# Patient Record
Sex: Male | Born: 1950 | Race: White | Hispanic: No | Marital: Married | Smoking: Former smoker
Health system: Southern US, Community
[De-identification: ages and names within clinical notes are randomized; demographics above are authoritative.]

## PROBLEM LIST (undated history)

## (undated) DIAGNOSIS — I10 Essential (primary) hypertension: Secondary | ICD-10-CM

## (undated) DIAGNOSIS — N400 Enlarged prostate without lower urinary tract symptoms: Secondary | ICD-10-CM

## (undated) DIAGNOSIS — K219 Gastro-esophageal reflux disease without esophagitis: Secondary | ICD-10-CM

## (undated) DIAGNOSIS — J449 Chronic obstructive pulmonary disease, unspecified: Secondary | ICD-10-CM

## (undated) DIAGNOSIS — I219 Acute myocardial infarction, unspecified: Secondary | ICD-10-CM

## (undated) DIAGNOSIS — I639 Cerebral infarction, unspecified: Secondary | ICD-10-CM

## (undated) DIAGNOSIS — Z7289 Other problems related to lifestyle: Secondary | ICD-10-CM

## (undated) DIAGNOSIS — I779 Disorder of arteries and arterioles, unspecified: Secondary | ICD-10-CM

## (undated) DIAGNOSIS — E7800 Pure hypercholesterolemia, unspecified: Secondary | ICD-10-CM

## (undated) DIAGNOSIS — R002 Palpitations: Secondary | ICD-10-CM

## (undated) DIAGNOSIS — J439 Emphysema, unspecified: Secondary | ICD-10-CM

## (undated) DIAGNOSIS — I4729 Other ventricular tachycardia: Secondary | ICD-10-CM

## (undated) DIAGNOSIS — I472 Ventricular tachycardia: Secondary | ICD-10-CM

## (undated) DIAGNOSIS — H349 Unspecified retinal vascular occlusion: Secondary | ICD-10-CM

## (undated) DIAGNOSIS — G459 Transient cerebral ischemic attack, unspecified: Secondary | ICD-10-CM

## (undated) DIAGNOSIS — IMO0002 Reserved for concepts with insufficient information to code with codable children: Secondary | ICD-10-CM

## (undated) DIAGNOSIS — I2510 Atherosclerotic heart disease of native coronary artery without angina pectoris: Secondary | ICD-10-CM

## (undated) DIAGNOSIS — Z720 Tobacco use: Secondary | ICD-10-CM

## (undated) DIAGNOSIS — H3410 Central retinal artery occlusion, unspecified eye: Secondary | ICD-10-CM

## (undated) DIAGNOSIS — I4892 Unspecified atrial flutter: Secondary | ICD-10-CM

## (undated) DIAGNOSIS — R9430 Abnormal result of cardiovascular function study, unspecified: Secondary | ICD-10-CM

## (undated) DIAGNOSIS — I509 Heart failure, unspecified: Secondary | ICD-10-CM

## (undated) DIAGNOSIS — F1090 Alcohol use, unspecified, uncomplicated: Secondary | ICD-10-CM

## (undated) DIAGNOSIS — Z789 Other specified health status: Secondary | ICD-10-CM

## (undated) DIAGNOSIS — C61 Malignant neoplasm of prostate: Secondary | ICD-10-CM

## (undated) DIAGNOSIS — I7300 Raynaud's syndrome without gangrene: Secondary | ICD-10-CM

## (undated) DIAGNOSIS — I739 Peripheral vascular disease, unspecified: Secondary | ICD-10-CM

## (undated) DIAGNOSIS — D126 Benign neoplasm of colon, unspecified: Secondary | ICD-10-CM

## (undated) DIAGNOSIS — R760 Raised antibody titer: Secondary | ICD-10-CM

## (undated) HISTORY — DX: Malignant neoplasm of prostate: C61

## (undated) HISTORY — DX: Raynaud's syndrome without gangrene: I73.00

## (undated) HISTORY — DX: Benign prostatic hyperplasia without lower urinary tract symptoms: N40.0

## (undated) HISTORY — DX: Other specified health status: Z78.9

## (undated) HISTORY — DX: Gastro-esophageal reflux disease without esophagitis: K21.9

## (undated) HISTORY — DX: Atherosclerotic heart disease of native coronary artery without angina pectoris: I25.10

## (undated) HISTORY — DX: Other ventricular tachycardia: I47.29

## (undated) HISTORY — PX: MELANOMA EXCISION: SHX5266

## (undated) HISTORY — DX: Ventricular tachycardia: I47.2

## (undated) HISTORY — DX: Transient cerebral ischemic attack, unspecified: G45.9

## (undated) HISTORY — DX: Raised antibody titer: R76.0

## (undated) HISTORY — DX: Central retinal artery occlusion, unspecified eye: H34.10

## (undated) HISTORY — DX: Tobacco use: Z72.0

## (undated) HISTORY — DX: Benign neoplasm of colon, unspecified: D12.6

## (undated) HISTORY — DX: Acute myocardial infarction, unspecified: I21.9

## (undated) HISTORY — DX: Other problems related to lifestyle: Z72.89

## (undated) HISTORY — DX: Abnormal result of cardiovascular function study, unspecified: R94.30

## (undated) HISTORY — DX: Essential (primary) hypertension: I10

## (undated) HISTORY — DX: Unspecified atrial flutter: I48.92

## (undated) HISTORY — DX: Pure hypercholesterolemia, unspecified: E78.00

## (undated) HISTORY — DX: Peripheral vascular disease, unspecified: I73.9

## (undated) HISTORY — DX: Reserved for concepts with insufficient information to code with codable children: IMO0002

## (undated) HISTORY — DX: Alcohol use, unspecified, uncomplicated: F10.90

## (undated) HISTORY — DX: Palpitations: R00.2

## (undated) HISTORY — DX: Disorder of arteries and arterioles, unspecified: I77.9

---

## 2003-04-10 ENCOUNTER — Encounter: Admission: RE | Admit: 2003-04-10 | Discharge: 2003-04-10 | Payer: Self-pay | Admitting: Otolaryngology

## 2003-05-02 ENCOUNTER — Ambulatory Visit (HOSPITAL_COMMUNITY): Admission: RE | Admit: 2003-05-02 | Discharge: 2003-05-02 | Payer: Self-pay | Admitting: Otolaryngology

## 2003-06-15 ENCOUNTER — Encounter (INDEPENDENT_AMBULATORY_CARE_PROVIDER_SITE_OTHER): Payer: Self-pay | Admitting: *Deleted

## 2003-06-15 ENCOUNTER — Ambulatory Visit (HOSPITAL_COMMUNITY): Admission: RE | Admit: 2003-06-15 | Discharge: 2003-06-16 | Payer: Self-pay | Admitting: Otolaryngology

## 2003-06-27 ENCOUNTER — Inpatient Hospital Stay (HOSPITAL_BASED_OUTPATIENT_CLINIC_OR_DEPARTMENT_OTHER): Admission: RE | Admit: 2003-06-27 | Discharge: 2003-06-27 | Payer: Self-pay | Admitting: *Deleted

## 2003-06-27 HISTORY — PX: CARDIAC CATHETERIZATION: SHX172

## 2004-03-17 HISTORY — PX: THYROGLOSSAL DUCT CYST: SHX297

## 2004-10-29 ENCOUNTER — Ambulatory Visit: Payer: Self-pay | Admitting: Internal Medicine

## 2004-11-06 ENCOUNTER — Ambulatory Visit: Payer: Self-pay

## 2004-11-25 ENCOUNTER — Ambulatory Visit: Payer: Self-pay | Admitting: Internal Medicine

## 2004-11-28 ENCOUNTER — Ambulatory Visit: Payer: Self-pay | Admitting: Internal Medicine

## 2004-12-05 ENCOUNTER — Ambulatory Visit: Payer: Self-pay | Admitting: Cardiology

## 2004-12-12 ENCOUNTER — Ambulatory Visit: Payer: Self-pay | Admitting: Cardiology

## 2004-12-26 ENCOUNTER — Ambulatory Visit: Payer: Self-pay | Admitting: Cardiology

## 2005-01-02 ENCOUNTER — Ambulatory Visit: Payer: Self-pay | Admitting: Cardiology

## 2005-01-23 ENCOUNTER — Ambulatory Visit: Payer: Self-pay | Admitting: Cardiology

## 2005-02-20 ENCOUNTER — Ambulatory Visit: Payer: Self-pay | Admitting: Cardiology

## 2005-03-12 ENCOUNTER — Observation Stay (HOSPITAL_COMMUNITY): Admission: EM | Admit: 2005-03-12 | Discharge: 2005-03-13 | Payer: Self-pay | Admitting: Emergency Medicine

## 2005-03-13 ENCOUNTER — Ambulatory Visit: Payer: Self-pay | Admitting: Internal Medicine

## 2005-03-20 ENCOUNTER — Ambulatory Visit: Payer: Self-pay | Admitting: Internal Medicine

## 2005-03-25 ENCOUNTER — Ambulatory Visit: Payer: Self-pay | Admitting: Oncology

## 2005-04-17 ENCOUNTER — Ambulatory Visit: Payer: Self-pay | Admitting: Cardiology

## 2005-05-05 ENCOUNTER — Ambulatory Visit (HOSPITAL_COMMUNITY): Admission: RE | Admit: 2005-05-05 | Discharge: 2005-05-05 | Payer: Self-pay | Admitting: Vascular Surgery

## 2005-05-07 ENCOUNTER — Ambulatory Visit (HOSPITAL_COMMUNITY): Admission: RE | Admit: 2005-05-07 | Discharge: 2005-05-07 | Payer: Self-pay | Admitting: Vascular Surgery

## 2005-12-24 ENCOUNTER — Ambulatory Visit: Payer: Self-pay | Admitting: Cardiology

## 2006-01-28 ENCOUNTER — Emergency Department (HOSPITAL_COMMUNITY): Admission: EM | Admit: 2006-01-28 | Discharge: 2006-01-28 | Payer: Self-pay | Admitting: Emergency Medicine

## 2006-07-28 ENCOUNTER — Ambulatory Visit: Payer: Self-pay | Admitting: Internal Medicine

## 2006-07-28 LAB — CONVERTED CEMR LAB
ALT: 29 units/L (ref 0–40)
AST: 24 units/L (ref 0–37)
Albumin: 4.3 g/dL (ref 3.5–5.2)
Alkaline Phosphatase: 59 units/L (ref 39–117)
BUN: 15 mg/dL (ref 6–23)
Basophils Absolute: 0 10*3/uL (ref 0.0–0.1)
Basophils Relative: 0.1 % (ref 0.0–1.0)
Bilirubin Urine: NEGATIVE
Bilirubin, Direct: 0.1 mg/dL (ref 0.0–0.3)
CO2: 29 meq/L (ref 19–32)
Calcium: 9.7 mg/dL (ref 8.4–10.5)
Chloride: 105 meq/L (ref 96–112)
Cholesterol: 129 mg/dL (ref 0–200)
Creatinine, Ser: 0.9 mg/dL (ref 0.4–1.5)
Eosinophils Absolute: 0.1 10*3/uL (ref 0.0–0.6)
Eosinophils Relative: 1.1 % (ref 0.0–5.0)
GFR calc Af Amer: 113 mL/min
GFR calc non Af Amer: 93 mL/min
Glucose, Bld: 97 mg/dL (ref 70–99)
HCT: 45.7 % (ref 39.0–52.0)
HDL: 40.9 mg/dL (ref >39.0)
Hemoglobin, Urine: NEGATIVE
Hemoglobin: 15.9 g/dL (ref 13.0–17.0)
Ketones, ur: NEGATIVE mg/dL
LDL Cholesterol: 77 mg/dL (ref 0–99)
Leukocytes, UA: NEGATIVE
Lymphocytes Relative: 23.4 % (ref 12.0–46.0)
MCHC: 34.8 g/dL (ref 30.0–36.0)
MCV: 96 fL (ref 78.0–100.0)
Monocytes Absolute: 0.6 10*3/uL (ref 0.2–0.7)
Monocytes Relative: 6.9 % (ref 3.0–11.0)
Neutro Abs: 5.7 10*3/uL (ref 1.4–7.7)
Neutrophils Relative %: 68.5 % (ref 43.0–77.0)
Nitrite: NEGATIVE
PSA: 1.22 ng/mL (ref 0.10–4.00)
Platelets: 246 10*3/uL (ref 150–400)
Potassium: 4.8 meq/L (ref 3.5–5.1)
RBC: 4.76 M/uL (ref 4.22–5.81)
RDW: 12.5 % (ref 11.5–14.6)
Sodium: 141 meq/L (ref 135–145)
Specific Gravity, Urine: 1.01 (ref 1.000–1.03)
TSH: 1.33 microintl units/mL (ref 0.35–5.50)
Total Bilirubin: 0.7 mg/dL (ref 0.3–1.2)
Total CHOL/HDL Ratio: 3.2
Total Protein, Urine: NEGATIVE mg/dL
Total Protein: 6.9 g/dL (ref 6.0–8.3)
Triglycerides: 54 mg/dL (ref 0–149)
Urine Glucose: NEGATIVE mg/dL
Urobilinogen, UA: 0.2 (ref 0.0–1.0)
VLDL: 11 mg/dL (ref 0–40)
WBC: 8.3 10*3/uL (ref 4.5–10.5)
pH: 7 (ref 5.0–8.0)

## 2006-09-21 ENCOUNTER — Ambulatory Visit: Payer: Self-pay | Admitting: Gastroenterology

## 2006-11-06 ENCOUNTER — Encounter: Payer: Self-pay | Admitting: Gastroenterology

## 2006-11-06 ENCOUNTER — Ambulatory Visit: Payer: Self-pay | Admitting: Gastroenterology

## 2006-11-06 ENCOUNTER — Encounter (INDEPENDENT_AMBULATORY_CARE_PROVIDER_SITE_OTHER): Payer: Self-pay | Admitting: *Deleted

## 2007-01-11 ENCOUNTER — Ambulatory Visit: Payer: Self-pay | Admitting: Cardiology

## 2007-01-18 ENCOUNTER — Encounter: Payer: Self-pay | Admitting: Cardiology

## 2007-01-18 ENCOUNTER — Ambulatory Visit: Payer: Self-pay

## 2007-03-18 HISTORY — PX: INGUINAL HERNIA REPAIR: SHX194

## 2007-04-07 ENCOUNTER — Ambulatory Visit: Payer: Self-pay | Admitting: Cardiology

## 2007-04-13 ENCOUNTER — Ambulatory Visit: Payer: Self-pay

## 2007-07-05 DIAGNOSIS — H3410 Central retinal artery occlusion, unspecified eye: Secondary | ICD-10-CM

## 2007-07-05 DIAGNOSIS — F1010 Alcohol abuse, uncomplicated: Secondary | ICD-10-CM

## 2007-07-05 DIAGNOSIS — E785 Hyperlipidemia, unspecified: Secondary | ICD-10-CM

## 2007-07-05 DIAGNOSIS — E041 Nontoxic single thyroid nodule: Secondary | ICD-10-CM | POA: Insufficient documentation

## 2007-07-05 DIAGNOSIS — D126 Benign neoplasm of colon, unspecified: Secondary | ICD-10-CM

## 2007-07-05 DIAGNOSIS — I10 Essential (primary) hypertension: Secondary | ICD-10-CM

## 2007-07-05 DIAGNOSIS — K219 Gastro-esophageal reflux disease without esophagitis: Secondary | ICD-10-CM

## 2007-08-24 ENCOUNTER — Encounter: Payer: Self-pay | Admitting: Internal Medicine

## 2007-10-19 ENCOUNTER — Ambulatory Visit: Payer: Self-pay | Admitting: Internal Medicine

## 2007-10-20 LAB — CONVERTED CEMR LAB
ALT: 26 units/L (ref 0–53)
AST: 21 units/L (ref 0–37)
Albumin: 4.2 g/dL (ref 3.5–5.2)
Alkaline Phosphatase: 57 units/L (ref 39–117)
BUN: 16 mg/dL (ref 6–23)
Basophils Absolute: 0 10*3/uL (ref 0.0–0.1)
Basophils Relative: 0.5 % (ref 0.0–3.0)
Bilirubin, Direct: 0.1 mg/dL (ref 0.0–0.3)
CO2: 29 meq/L (ref 19–32)
Calcium: 9.5 mg/dL (ref 8.4–10.5)
Chloride: 104 meq/L (ref 96–112)
Cholesterol: 115 mg/dL (ref 0–200)
Creatinine, Ser: 1 mg/dL (ref 0.4–1.5)
Eosinophils Absolute: 0.3 10*3/uL (ref 0.0–0.7)
Eosinophils Relative: 4.7 % (ref 0.0–5.0)
GFR calc Af Amer: 99 mL/min
GFR calc non Af Amer: 82 mL/min
Glucose, Bld: 73 mg/dL (ref 70–99)
HCT: 43.1 % (ref 39.0–52.0)
HDL: 46.2 mg/dL (ref >39.0)
Hemoglobin: 14.8 g/dL (ref 13.0–17.0)
LDL Cholesterol: 49 mg/dL (ref 0–99)
Lymphocytes Relative: 31.6 % (ref 12.0–46.0)
MCHC: 34.4 g/dL (ref 30.0–36.0)
MCV: 98.9 fL (ref 78.0–100.0)
Monocytes Absolute: 0.7 10*3/uL (ref 0.1–1.0)
Monocytes Relative: 10.8 % (ref 3.0–12.0)
Neutro Abs: 3.7 10*3/uL (ref 1.4–7.7)
Neutrophils Relative %: 52.4 % (ref 43.0–77.0)
PSA: 1.21 ng/mL (ref 0.10–4.00)
Platelets: 236 10*3/uL (ref 150–400)
Potassium: 4.3 meq/L (ref 3.5–5.1)
RBC: 4.36 M/uL (ref 4.22–5.81)
RDW: 12.5 % (ref 11.5–14.6)
Sodium: 140 meq/L (ref 135–145)
TSH: 1.89 microintl units/mL (ref 0.35–5.50)
Total Bilirubin: 0.6 mg/dL (ref 0.3–1.2)
Total CHOL/HDL Ratio: 2.5
Total Protein: 6.6 g/dL (ref 6.0–8.3)
Triglycerides: 97 mg/dL (ref 0–149)
VLDL: 19 mg/dL (ref 0–40)
Vit D, 1,25-Dihydroxy: 50 (ref 30–89)
WBC: 6.8 10*3/uL (ref 4.5–10.5)

## 2008-01-07 ENCOUNTER — Ambulatory Visit (HOSPITAL_COMMUNITY): Admission: RE | Admit: 2008-01-07 | Discharge: 2008-01-07 | Payer: Self-pay | Admitting: General Surgery

## 2008-04-06 ENCOUNTER — Ambulatory Visit: Payer: Self-pay | Admitting: Cardiology

## 2008-12-19 ENCOUNTER — Ambulatory Visit: Payer: Self-pay | Admitting: Internal Medicine

## 2008-12-19 DIAGNOSIS — K4090 Unilateral inguinal hernia, without obstruction or gangrene, not specified as recurrent: Secondary | ICD-10-CM | POA: Insufficient documentation

## 2008-12-19 DIAGNOSIS — N400 Enlarged prostate without lower urinary tract symptoms: Secondary | ICD-10-CM | POA: Insufficient documentation

## 2008-12-20 LAB — CONVERTED CEMR LAB
ALT: 23 units/L (ref 0–53)
AST: 20 units/L (ref 0–37)
Albumin: 4.2 g/dL (ref 3.5–5.2)
Alkaline Phosphatase: 63 units/L (ref 39–117)
BUN: 14 mg/dL (ref 6–23)
Basophils Absolute: 0 10*3/uL (ref 0.0–0.1)
Basophils Relative: 0.5 % (ref 0.0–3.0)
Bilirubin Urine: NEGATIVE
Bilirubin, Direct: 0 mg/dL (ref 0.0–0.3)
CO2: 31 meq/L (ref 19–32)
Calcium: 9.6 mg/dL (ref 8.4–10.5)
Chloride: 104 meq/L (ref 96–112)
Cholesterol: 116 mg/dL (ref 0–200)
Creatinine, Ser: 0.9 mg/dL (ref 0.4–1.5)
Eosinophils Absolute: 0.3 10*3/uL (ref 0.0–0.7)
Eosinophils Relative: 3.7 % (ref 0.0–5.0)
GFR calc non Af Amer: 92.31 mL/min (ref >60)
Glucose, Bld: 78 mg/dL (ref 70–99)
HCT: 45.2 % (ref 39.0–52.0)
HDL: 47.4 mg/dL (ref >39.00)
Hemoglobin, Urine: NEGATIVE
Hemoglobin: 15.1 g/dL (ref 13.0–17.0)
Ketones, ur: NEGATIVE mg/dL
LDL Cholesterol: 51 mg/dL (ref 0–99)
Leukocytes, UA: NEGATIVE
Lymphocytes Relative: 28 % (ref 12.0–46.0)
Lymphs Abs: 2 10*3/uL (ref 0.7–4.0)
MCHC: 33.4 g/dL (ref 30.0–36.0)
MCV: 98.1 fL (ref 78.0–100.0)
Monocytes Absolute: 0.4 10*3/uL (ref 0.1–1.0)
Monocytes Relative: 5.8 % (ref 3.0–12.0)
Neutro Abs: 4.4 10*3/uL (ref 1.4–7.7)
Neutrophils Relative %: 62 % (ref 43.0–77.0)
Nitrite: NEGATIVE
PSA: 1.18 ng/mL (ref 0.10–4.00)
Platelets: 236 10*3/uL (ref 150.0–400.0)
Potassium: 4.5 meq/L (ref 3.5–5.1)
RBC: 4.6 M/uL (ref 4.22–5.81)
RDW: 12.7 % (ref 11.5–14.6)
Sodium: 140 meq/L (ref 135–145)
Specific Gravity, Urine: 1.01 (ref 1.000–1.030)
TSH: 1.63 microintl units/mL (ref 0.35–5.50)
Total Bilirubin: 0.5 mg/dL (ref 0.3–1.2)
Total CHOL/HDL Ratio: 2
Total Protein, Urine: NEGATIVE mg/dL
Total Protein: 7 g/dL (ref 6.0–8.3)
Triglycerides: 90 mg/dL (ref 0.0–149.0)
Urine Glucose: NEGATIVE mg/dL
Urobilinogen, UA: 0.2 (ref 0.0–1.0)
VLDL: 18 mg/dL (ref 0.0–40.0)
WBC: 7.1 10*3/uL (ref 4.5–10.5)
pH: 7 (ref 5.0–8.0)

## 2009-01-31 ENCOUNTER — Encounter (INDEPENDENT_AMBULATORY_CARE_PROVIDER_SITE_OTHER): Payer: Self-pay | Admitting: *Deleted

## 2009-04-11 ENCOUNTER — Encounter: Payer: Self-pay | Admitting: Cardiology

## 2009-04-12 ENCOUNTER — Ambulatory Visit: Payer: Self-pay | Admitting: Cardiology

## 2009-04-12 ENCOUNTER — Ambulatory Visit: Payer: Self-pay

## 2009-04-12 DIAGNOSIS — F17200 Nicotine dependence, unspecified, uncomplicated: Secondary | ICD-10-CM | POA: Insufficient documentation

## 2009-10-11 ENCOUNTER — Encounter: Payer: Self-pay | Admitting: Gastroenterology

## 2009-10-23 ENCOUNTER — Encounter (INDEPENDENT_AMBULATORY_CARE_PROVIDER_SITE_OTHER): Payer: Self-pay | Admitting: *Deleted

## 2009-12-05 ENCOUNTER — Encounter (INDEPENDENT_AMBULATORY_CARE_PROVIDER_SITE_OTHER): Payer: Self-pay | Admitting: *Deleted

## 2009-12-05 ENCOUNTER — Ambulatory Visit: Payer: Self-pay | Admitting: Gastroenterology

## 2009-12-05 DIAGNOSIS — Z860100 Personal history of colon polyps, unspecified: Secondary | ICD-10-CM | POA: Insufficient documentation

## 2010-01-16 ENCOUNTER — Ambulatory Visit: Payer: Self-pay | Admitting: Gastroenterology

## 2010-01-23 ENCOUNTER — Encounter: Payer: Self-pay | Admitting: Gastroenterology

## 2010-04-10 ENCOUNTER — Encounter: Payer: Self-pay | Admitting: Cardiology

## 2010-04-12 ENCOUNTER — Encounter: Payer: Self-pay | Admitting: Cardiology

## 2010-04-12 ENCOUNTER — Ambulatory Visit: Admit: 2010-04-12 | Payer: Self-pay | Admitting: Cardiology

## 2010-04-12 ENCOUNTER — Ambulatory Visit: Admission: RE | Admit: 2010-04-12 | Discharge: 2010-04-12 | Payer: Self-pay | Source: Home / Self Care

## 2010-04-16 NOTE — Letter (Signed)
Summary: Results Letter  [facility]  [address] [place], [place] [address]   Phone: [phone]  Fax: [phone]        January 23, 2010 MRN: [id number]    [name] [address] [place], [place]  [address]    Dear Mr. Ploch,   One of the polyps removed during your recent procedure was proven to be adenomatous.  These are pre-cancerous polyps that may have grown into cancers if they had not been removed.  Based on current nationally recognized surveillance guidelines, I recommend that you have a repeat colonoscopy in 5 years.  We will therefore put your information in our reminder system and will contact you in 5 years to schedule a repeat procedure.  Please call if you have any questions or concerns.       Sincerely,  Rachael Fee MD  This letter has been electronically signed by your physician.  Appended Document: Results Letter letter mailed

## 2010-04-16 NOTE — Assessment & Plan Note (Signed)
Summary: Z6X      Allergies Added: NKDA  Visit Type:  Follow-up   History of Present Illness: The patient is seen today or followup carotid artery disease and some chest pain in the past.  I saw him last April 06, 2008.  He has not had any recurrent chest pain.  Unfortunately he continues to smoke.  We did to about this and of course I encouraged him to stop.  Is not having any palpitations.  He did have a followup carotid artery Doppler study today.  He has known Raynaud's phenomenon.  This is unchanged.  Current Medications (verified): 1)  Metoprolol Tartrate 25 Mg  Tabs (Metoprolol Tartrate) .Marland Kitchen.. 1 By Mouth Two Times A Day 2)  Crestor 20 Mg  Tabs (Rosuvastatin Calcium) .... Take 1 By Mouth Once Daily 3)  Aggrenox 25-200 Mg  Xr12h-Cap (Aspirin-Dipyridamole) .Marland Kitchen.. 1 By Mouth Two Times A Day 4)  Viagra 100 Mg  Tabs (Sildenafil Citrate) .Marland Kitchen.. 1 By Mouth Once Daily As Needed 5)  Bph Study Drug *pmr*  Allergies (verified): No Known Drug Allergies  Past History:  Past Medical History: ADENOMATOUS COLONIC POLYP (ICD-211.3) PALPITATIONS, HX OF (ICD-V12.50) HYPERCHOLESTEROLEMIA (ICD-272.0) GERD (ICD-530.81) ALCOHOL USE (ICD-305.00) HYPERTENSION (ICD-401.9) RAYNAUDS SYNDROME (ICD-443.0) Antiphospholipid antibody  ??? in the past, but not proven according to the patient TIA... 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 echo.Marland KitchenMarland KitchenNovember, 2008 Cardiac catheterization...2005.... mild irregularity of the LAD..( patient had had abnormal Myoview scan) Tobacco abuse Carotid artery disease... 0-39% R. ICA, 40-59% LICA... Doppler.... January, 2010  /  Doppler... in January, 2011 no change  Review of Systems       Patient denies fever, chills, headache, sweats, rash, change in vision, change in hearing, chest pain, cough,  shortness of breath, nausea or vomiting, urinary symptoms.  All other systems are reviewed and are negative.  Vital Signs:  Patient profile:   60 year old male Height:      74 inches Weight:      172 pounds BMI:     22.16 Pulse rate:   72 / minute BP sitting:   132 / 54  (left arm) Cuff size:   regular  Vitals Entered By: Hardin Negus, RMA (April 12, 2009 3:31 PM)  Physical Exam  General:  in general he is stable today. Head:  no xanthelasma.  He does have some skin changes on the nose there probably consistent with his Raynaud's.Head is atraumatic. Eyes:  no xanthelasma. Neck:  neck reveals a soft carotid bruit on the left. Chest Wall:  chest wall is nontender. Lungs:  lungs are clear.  Respiratory effort is nonlabored. Heart:  cardiac exam reveals S1-S2.  No clicks or significant murmurs Abdomen:  abdomen is soft. Msk:  no musculoskeletal deformities. Extremities:  no peripheral edema. Skin:  skin of his hands is red.  I believe this is part of his Raynaud's phenomena. Psych:  patient is oriented to person time and place.  Affect is normal.   Impression & Recommendations:  Problem # 1:  PALPITATIONS, HX OF (ICD-V12.50)  The patient has not been followed by any significant palpitations. EKG is done today and reviewed by me.  Patient has voltage criteria for LVH with some ST scooping.  There is mild J-point elevation in V1 to V5.  There is no significant change.  No further workup is needed.  Orders: EKG w/ Interpretation (  [address])  Problem # 2:  HYPERTENSION (ICD-401.9)  His updated medication list for this problem includes:    Metoprolol Tartrate 25 Mg Tabs (Metoprolol tartrate) .Marland Kitchen... 1 by mouth two times a day Blood pressure is controlled today.  No change in therapy.  Problem # 3:  RAYNAUDS SYNDROME (ICD-443.0) I have nothing else concerning his Raynaud's phenomenon.  The winter has been cold and he has had some discomfort in his toes.  Problem # 4:  TOBACCO  ABUSE (ICD-305.1) Unfortunately he continues to smoke.  For him this is a very high risk behavior.  I strongly encouraged him to stop.  Problem # 5:  CAROTID ARTERY DISEASE (ICD-433.10)  His updated medication list for this problem includes:    Aggrenox 25-200 Mg Xr12h-cap (Aspirin-dipyridamole) .Marland Kitchen... 1 by mouth two times a day The patient had followup carotid Doppler today.  The findings are stable and seems similar to the past.  He can be followed up in one year.  Patient Instructions: 1)  Follow up in 1 year

## 2010-04-16 NOTE — Letter (Signed)
Summary: New Patient letter  [facility]  [address] [place], [place] [address]   Phone: [phone]  Fax: [phone]       10/23/2009 MRN: [id number]  [name] [address] [place], [place]  [address]  Dear Mr. [name],  Welcome to the Gastroenterology Division at [facility].    You are scheduled to see Dr. Christella Hartigan on 12/05/2009 at 8:30AM on the 3rd floor at [facility], [address]  We ask that you try to arrive at our office 15 minutes prior to your appointment time to allow for check-in.  We would like you to complete the enclosed self-administered evaluation form prior to your visit and bring it with you on the day of your appointment.  We will review it with you.  Also, please bring a complete list of all your medications or, if you prefer, bring the medication bottles and we will list them.  Please bring your insurance card so that we may make a copy of it.  If your insurance requires a referral to see a specialist, please bring your referral form from your primary care physician.  Co-payments are due at the time of your visit and may be paid by cash, check or credit card.     Your office visit will consist of a consult with your physician (includes a physical exam), any laboratory testing he/she may order, scheduling of any necessary diagnostic testing (e.g. x-ray, ultrasound, CT-scan), and scheduling of a procedure (e.g. Endoscopy, Colonoscopy) if required.  Please allow enough time on your schedule to allow for any/all of these possibilities.    If you cannot keep your appointment, please call [phone] to cancel or reschedule prior to your appointment date.  This allows [place] the opportunity to schedule an appointment for another patient in need of care.  If you do not cancel or reschedule by 5 p.m. the business day prior to your appointment date, you will be charged a $50.00 late cancellation/no-show fee.    Thank you for choosing [facility] for your medical needs.  We appreciate the opportunity to care for you.  Please visit [place] at our website  to learn more about our practice.                     Sincerely,                                                             The Gastroenterology Division

## 2010-04-16 NOTE — Procedures (Signed)
Summary: Colonoscopy  Patient: [name] Note: All result statuses are Final unless otherwise noted.  Tests: (1) Colonoscopy (COL)   COL Colonoscopy           DONE     [facility]     [address]     [place], [place]  [address]           COLONOSCOPY PROCEDURE REPORT           PATIENT:  [name], [name]  MR#:  [id number]     BIRTHDATE:  1950-11-25, 59 yrs. old  GENDER:  male     ENDOSCOPIST:  Rachael Fee, MD     PROCEDURE DATE:  01/16/2010     PROCEDURE:  Colonoscopy with snare polypectomy     ASA CLASS:  Class II     INDICATIONS:  history of pre-cancerous (adenomatous) colon polyps           MEDICATIONS:   Fentanyl 75 mcg IV, Versed 8 mg IV           DESCRIPTION OF PROCEDURE:   After the risks benefits and     alternatives of the procedure were thoroughly explained, informed     consent was obtained.  Digital rectal exam was performed and     revealed no rectal masses.   The LB 180AL [id number] endoscope was     introduced through the anus and advanced to the cecum, which was     identified by both the appendix and ileocecal valve, without     limitations.  The quality of the prep was good, using MoviPrep.     The instrument was then slowly withdrawn as the colon was fully     examined.     <<PROCEDUREIMAGES>>     FINDINGS:  Two small sessile polyps were removed with cold snare     and sent to pathology (jar 1). These were 2mm across, located in     transverse and descending segments (see image5 and image6).  Mild     diverticulosis was found in the sigmoid to descending colon     segments (see image1).  This was otherwise a normal examination of     the colon (see image3, image4, and image7).   Retroflexed views in     the rectum revealed no abnormalities.    The scope was then     withdrawn from the patient and the procedure     completed.COMPLICATIONS:  None     ENDOSCOPIC IMPRESSION:     1) Two small polyps, both removed and sent to pathology     2) Mild  diverticulosis in the sigmoid to descending colon     segments     3) Otherwise normal examination           RECOMMENDATIONS:     1) Given your personal history of adenomatous (pre-cancerous)     polyps, you will need a repeat colonoscopy in 5 years even if the     polyps removed today are NOT precancerous.     2) You will receive a letter within 1-2 weeks with the results     of your biopsy as well as final recommendations. Please call my     office if you have not received a letter after 3 weeks.           ______________________________     Rachael Fee, MD           cc: Fayrene Fearing  Jonny Ruiz, MD           n.     Rosalie Doctor:   Rachael Fee at 01/16/2010 08:43 AM           Janeece Riggers, [phone]  Note: An exclamation mark (!) indicates a result that was not dispersed into the flowsheet. Document Creation Date: 01/16/2010 8:44 AM _______________________________________________________________________  (1) Order result status: Final Collection or observation date-time: 01/16/2010 08:37 Requested date-time:  Receipt date-time:  Reported date-time:  Referring Physician:   Ordering Physician: Rob Bunting [phone]) Specimen Source:  Source: Launa Grill Order Number: [id number] Lab site:   Appended Document: Colonoscopy     Procedures Next Due Date:    Colonoscopy: 01/2015

## 2010-04-16 NOTE — Letter (Signed)
Summary: Colonoscopy Letter  [facility]  [address] [place], [place] [address]   Phone: [phone]  Fax: [phone]      October 11, 2009 MRN: [id number]   Josephus THUESON [address] [place], [place]  [address]   Dear Mr. Passmore,   According to your medical record, it is time for you to schedule a Colonoscopy. The American Cancer Society recommends this procedure as a method to detect early colon cancer. Patients with a family history of colon cancer, or a personal history of colon polyps or inflammatory bowel disease are at increased risk.  This letter has been generated based on the recommendations made at the time of your procedure. If you feel that in your particular situation this may no longer apply, please contact our office.  Please call our office at [phone] to schedule this appointment or to update your records at your earliest convenience.  Thank you for cooperating with [place] to provide you with the very best care possible.   Sincerely,  Rachael Fee, M.D.  [facility] Gastroenterology Division [phone]

## 2010-04-16 NOTE — Miscellaneous (Signed)
Summary: Orders Update  Clinical Lists Changes  Orders: Added new Test order of Carotid Duplex (Carotid Duplex) - Signed 

## 2010-04-16 NOTE — Assessment & Plan Note (Signed)
Review of gastrointestinal problems: 1. Adenomatous colon polyps, Removed by colonoscopy August, 2008. Recall colonoscopy August 2011    History of Present Illness Visit Type: new patient  Primary GI MD: Rob Bunting MD Primary Provider: Corwin Levins  Requesting Provider: na Chief Complaint: GERD  History of Present Illness:     colonoscopy in 2008, three polyps were removed (two of them were adenomatous).  Set for 3 year recall.  He is on aggrenox because of carotid blockage, had a right eye TIA 4-5 years ago.  he has been well since his last colonoscopy, no bowel changes. No overt bleeding. He did stop Aggrenox at the time of his last colonoscopy however this visit we discussed it is probably safe to continue taking it.           Current Medications (verified): 1)  Metoprolol Tartrate 25 Mg  Tabs (Metoprolol Tartrate) .Marland Kitchen.. 1 By Mouth Two Times A Day 2)  Crestor 20 Mg  Tabs (Rosuvastatin Calcium) .... Take 1 By Mouth Once Daily 3)  Aggrenox 25-200 Mg  Xr12h-Cap (Aspirin-Dipyridamole) .Marland Kitchen.. 1 By Mouth Two Times A Day 4)  Viagra 100 Mg  Tabs (Sildenafil Citrate) .Marland Kitchen.. 1 By Mouth Once Daily As Needed 5)  Tums 500 Mg Chew (Calcium Carbonate Antacid) .... As Needed  Allergies (verified): No Known Drug Allergies  Family History: HTN ADOPTED  Social History: Married Alcohol use-yes Full Time -- [employer]il [employer] Tobacco Use - Yes.    g Vital Signs:  Patient profile:   60 year old male Height:      74 inches Weight:      166 pounds BMI:     21.39 BSA:     2.01 Pulse rate:   68 / minute Pulse rhythm:   regular BP sitting:   142 / 76  (left arm) Cuff size:   regular  Vitals Entered By: Ok Anis CMA (December 05, 2009 8:20 AM)  Physical Exam  Additional Exam:  Constitutional: generally well appearing Psychiatric: alert and oriented times 3 Abdomen: soft, non-tender, non-distended, normal bowel sounds  i  Impression & Recommendations:  Problem # 1:  personal  history of adenomatous polyps he is due for recall colonoscopy and we will set that up at his soonest convenience. In the past 2-3 years we have stopped holding Aggrenox for gi procedures. I think it is safe for him to continue taking it at the time of this procedure. I see no reason for any further blood tests or imaging studies prior to then.  Other Orders: Colonoscopy (Colon)  Patient Instructions: 1)  You will be scheduled to have a colonoscopy. 2)  You can stay on aggrenox for this procedure. 3)  One of your biggest health concerns is your smoking.  You should try your absolute best to stop.  If you need assistence, please contact your PCP or Smoking Cessation Class at [facility] [phone]) or [facility] Quit-Line (1-800-QUIT-NOW). 4)  The medication list was reviewed and reconciled.  All changed / newly prescribed medications were explained.  A complete medication list was provided to the patient / caregiver.  Appended Document: MOVIPREP  Prescriptions: MOVIPREP 100 GM  SOLR (PEG-KCL-NACL-NASULF-NA ASC-C) As per prep instructions.  #1 x 0   Entered by:   Francee Piccolo CMA (AAMA)   Authorized by:   Rachael Fee MD   Signed by:   Francee Piccolo CMA (AAMA) on 12/05/2009   Method used:   Electronically to  [facility] [address] [phone]* (retail)       [address]       [place], [place]  [address]       Ph: [phone]       Fax: [phone]   RxID:   [id number]

## 2010-04-16 NOTE — Letter (Signed)
Summary: [name] Instructions  [name]  [address] [place], [place] [address]   Phone: [phone]  Fax: [phone]       [name]    [birth year]    MRN: [id number]      Procedure Day Dorna Bloom: [name], 01/16/10     Arrival Time: 7:30 AM      Procedure Time: 8:30 AM    Location of Procedure:                    _X_  [name] (4th Floor)  PREPARATION FOR COLONOSCOPY WITH MOVIPREP   Starting 5 days prior to your procedure 01/12/10 do not eat nuts, seeds, popcorn, corn, beans, peas,  salads, or any raw vegetables.  Do not take any fiber supplements (e.g. Metamucil, Citrucel, and Benefiber).  THE DAY BEFORE YOUR PROCEDURE         TUESDAY, 01/15/10  1.  Drink clear liquids the entire day-NO SOLID FOOD  2.  Do not drink anything colored red or purple.  Avoid juices with pulp.  No orange juice.  3.  Drink at least 64 oz. (8 glasses) of fluid/clear liquids during the day to prevent dehydration and help the prep work efficiently.  CLEAR LIQUIDS INCLUDE: Water Jello Ice Popsicles Tea (sugar ok, no milk/cream) Powdered fruit flavored drinks Coffee (sugar ok, no milk/cream) Gatorade Juice: apple, white grape, white cranberry  Lemonade Clear bullion, consomm, broth Carbonated beverages (any kind) Strained chicken noodle soup Hard Candy                           4.  In the morning, mix first dose of MoviPrep solution:    Empty 1 Pouch A and 1 Pouch B into the disposable container    Add lukewarm drinking water to the top line of the container. Mix to dissolve    Refrigerate (mixed solution should be used within 24 hrs)  5.  Begin drinking the prep at 5:00 p.m. The MoviPrep container is divided by 4 marks.   Every 15 minutes drink the solution down to the next mark (approximately 8 oz) until the full liter is complete.   6.  Follow completed prep with 16 oz of clear liquid of your choice (Nothing red or purple).  Continue to drink clear  liquids until bedtime.  7.  Before going to bed, mix second dose of MoviPrep solution:    Empty 1 Pouch A and 1 Pouch B into the disposable container    Add lukewarm drinking water to the top line of the container. Mix to dissolve    Refrigerate  THE DAY OF YOUR PROCEDURE      WEDNESDAY, 01/16/10  Beginning at 3:30 a.m. (5 hours before procedure):         1. Every 15 minutes, drink the solution down to the next mark (approx 8 oz) until the full liter is complete.  2. Follow completed prep with 16 oz. of clear liquid of your choice.    3. You may drink clear liquids until 6:30 AM (2 HOURS BEFORE PROCEDURE).  MEDICATION INSTRUCTIONS  Unless otherwise instructed, you should take regular prescription medications with a small sip of water   as early as possible the morning of your procedure.       OTHER INSTRUCTIONS  You will need a responsible adult at least 59 years of age to accompany you and drive you home.   This  person must remain in the waiting room during your procedure.  Wear loose fitting clothing that is easily removed.  Leave jewelry and other valuables at home.  However, you may wish to bring a book to read or  an iPod/MP3 player to listen to music as you wait for your procedure to start.  Remove all body piercing jewelry and leave at home.  Total time from sign-in until discharge is approximately 2-3 hours.  You should go home directly after your procedure and rest.  You can resume normal activities the  day after your procedure.  The day of your procedure you should not:   Drive   Make legal decisions   Operate machinery   Drink alcohol   Return to work  You will receive specific instructions about eating, activities and medications before you leave.   The above instructions have been reviewed and explained to me by   [name], CMA (AAMA)    I fully understand and can verbalize these instructions _____________________________ Date  12/05/09

## 2010-04-16 NOTE — Miscellaneous (Signed)
  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 in the past, but not proven according to the patient TIA... 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 echo.Marland KitchenMarland KitchenNovember, 2008 Cardiac catheterization...2005.... mild irregularity of the LAD..( patient had had abnormal Myoview scan) Tobacco abuse Carotid artery disease... 0-39% R. ICA, 40-59% LICA... Doppler.... January, 2010  (04/11/2009 15:49)       Past History:  Past Medical History: ADENOMATOUS COLONIC POLYP (ICD-211.3) PALPITATIONS, HX OF (ICD-V12.50) HYPERCHOLESTEROLEMIA (ICD-272.0) GERD (ICD-530.81) ALCOHOL USE (ICD-305.00) HYPERTENSION (ICD-401.9) RAYNAUDS SYNDROME (ICD-443.0) Antiphospholipid antibody  ??? in the past, but not proven according to the patient TIA... 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 echo.Marland KitchenMarland KitchenNovember, 2008 Cardiac catheterization...2005.... mild irregularity of the LAD..( patient had had abnormal Myoview scan) Tobacco abuse Carotid artery disease... 0-39% R. ICA, 40-59% LICA... Doppler.... January, 2010

## 2010-04-18 NOTE — Miscellaneous (Signed)
Summary: Orders Update  Clinical Lists Changes  Orders: Added new Test order of Carotid Duplex (Carotid Duplex) - Signed 

## 2010-05-08 ENCOUNTER — Encounter: Payer: Self-pay | Admitting: Cardiology

## 2010-05-10 ENCOUNTER — Encounter: Payer: Self-pay | Admitting: Cardiology

## 2010-05-10 ENCOUNTER — Ambulatory Visit (INDEPENDENT_AMBULATORY_CARE_PROVIDER_SITE_OTHER): Payer: 59 | Admitting: Cardiology

## 2010-05-10 DIAGNOSIS — R002 Palpitations: Secondary | ICD-10-CM

## 2010-05-10 DIAGNOSIS — I10 Essential (primary) hypertension: Secondary | ICD-10-CM

## 2010-05-10 DIAGNOSIS — R9431 Abnormal electrocardiogram [ECG] [EKG]: Secondary | ICD-10-CM | POA: Insufficient documentation

## 2010-05-14 NOTE — Assessment & Plan Note (Signed)
Summary: E4V      Allergies Added: NKDA  Visit Type:  Follow-up Primary Provider:  Corwin Levins   CC:  palpitations.  History of Present Illness: The patient is seen for followup of palpitations, hypertension, carotid artery disease, tobacco abuse.  He's feeling well.  He does continue to smoke.  I counseled him about this.  He says he has cut back.  He is not having any chest pain.  He does have some palpitations.  He notes that he feels is only once or twice a year.  However with one episode a family member was able to document that his heart rate was in the range of 170.  The patient did feel this.  He did not have syncope or presyncope.  There is no chest pain.  It lasted in the range of one hour.  Current Medications (verified): 1)  Metoprolol Tartrate 25 Mg  Tabs (Metoprolol Tartrate) .Marland Kitchen.. 1 By Mouth Two Times A Day 2)  Crestor 20 Mg  Tabs (Rosuvastatin Calcium) .... Take 1 By Mouth Once Daily 3)  Aggrenox 25-200 Mg  Xr12h-Cap (Aspirin-Dipyridamole) .Marland Kitchen.. 1 By Mouth Two Times A Day 4)  Viagra 100 Mg  Tabs (Sildenafil Citrate) .Marland Kitchen.. 1 By Mouth Once Daily As Needed 5)  Tums 500 Mg Chew (Calcium Carbonate Antacid) .... 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 in the past, but not proven according to the patient TIA... 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 echo.Marland KitchenMarland KitchenNovember, 2008 Cardiac catheterization...2005.... mild irregularity of the LAD..( patient had had abnormal  Myoview scan) Tobacco abuse Carotid artery disease... 0-39% R. ICA, 40-59% LICA... Doppler.... January, 2010  /  Doppler... in January, 2011 no change  /  doppler1/27/2012...stable  0-39% RICA 40-59% LICA  Review of Systems       Patient denies fever, chills, headache, sweats, rash, change in vision, change in hearing, chest pain, cough, nausea vomiting, urinary symptoms.  All other systems are reviewed and are negative.  Vital Signs:  Patient profile:   60 year old male Height:      74 inches Weight:      175 pounds BMI:     22.55 Pulse rate:   57 / minute BP sitting:   138 / 72  (left arm) Cuff size:   regular  Vitals Entered By: Hardin Negus, RMA (May 10, 2010 10:02 AM)  Physical Exam  General:  patient is stable in general. Head:  head is atraumatic. Eyes:  no xanthelasma. Neck:  no jugular venous distention.  There is a left carotid bruit. Chest Wall:  no chest wall tenderness Lungs:  lungs are clear.  Respiratory effort is nonlabored. Heart:  cardiac exam reveals S1-S2.  No clicks or significant murmurs. Abdomen:  abdomen is soft. Msk:  no musculoskeletal deformities. Extremities:  no peripheral edema. Skin:  patient has a mottled appearance to his skin.  This is an old finding.  He does have what appears to be a form of Raynaud's Psych:  patient is oriented to person time and place.  Affect is normal.   Impression & Recommendations:  Problem # 1:  CAD (ICD-414.00)  The patient had mild irregularity of the LAD in 2005.  Echo in 2008 revealed ejection fraction 60%.  EKG is done today and reviewed by me.  There is no change from the prior tracing.  There is decreased anterior R wave progression.  No further ischemic workup at this time.  Orders: Echocardiogram (Echo)  Problem # 2:  CAROTID ARTERY DISEASE (ICD-433.10) Seen Him Last He Did Have a Carotid Doppler.  I Have Reviewed the Report.  There Is Mild Carotid Disease That Will Be Followed.  Problem # 3:   TOBACCO ABUSE (ICD-305.1) The patient continues to smoke.  I counseled him about this.  Problem # 4:  PALPITATIONS, HX OF (ICD-V12.50)  Historically the patient has had some mild palpitations.  As noted in the history of present illness he is a rare episode with a rapid heartbeat that may last as long as an hour.  This occurs only one or 2 times per year with no obvious etiology.  I suspect that he has a short burst of supraventricular tachycardia.  I chose not to change his medications as his symptoms are very rare.  Two-dimensional echo will be done to reassess his LV function.  If his LV function is normal no further workup will be done.  If there is left ventricular dysfunction will proceed with further workup.  Orders: Echocardiogram (Echo)  Problem # 5:  HYPERCHOLESTEROLEMIA (ICD-272.0) Lipids are being treated.  Problem # 6:  HYPERTENSION (ICD-401.9) Blood pressure is controlled.  No change in therapy.  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  As mentioned I strongly counseled him to stop.  I'll see back in one year for cardiology followup.  He'll be in touch with him about his echo results.  Other Orders: EKG w/ Interpretation (93000)  Patient Instructions: 1)  Your physician recommends that you schedule a follow-up appointment in: 1 year with Dr. Myrtis Ser 2)  Your physician recommends that you continue on your current medications as directed. Please refer to the Current Medication list given to you today. 3)  Your physician has requested that you have an echocardiogram.  Echocardiography is a painless test that uses sound waves to create images of your heart. It provides your doctor with information about the size and shape of your heart and how well your heart's chambers and valves are working.  This procedure takes approximately one hour. There are no restrictions for this procedure. 4)  Your physician  discussed the hazards of tobacco use.  Tobacco use cessation is recommended and techniques and options to help you quit were discussed.

## 2010-05-14 NOTE — Miscellaneous (Signed)
  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 in the past, but not proven according to the patient TIA... 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 echo.Marland KitchenMarland KitchenNovember, 2008 Cardiac catheterization...2005.... mild irregularity of the LAD..( patient had had abnormal Myoview scan) Tobacco abuse Carotid artery disease... 0-39% R. ICA, 40-59% LICA... Doppler.... January, 2010  /  Doppler... 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 in the past, but not proven according to the patient TIA... 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 echo.Marland KitchenMarland KitchenNovember, 2008 Cardiac catheterization...2005.... mild irregularity of the LAD..( patient had had abnormal Myoview scan) Tobacco abuse Carotid artery disease... 0-39% R. ICA, 40-59% LICA... Doppler.... January, 2010  /  Doppler... in January, 2011 no change  /  doppler1/27/2012...stable  0-39% RICA  40-59% LICA

## 2010-05-20 ENCOUNTER — Ambulatory Visit (HOSPITAL_COMMUNITY): Payer: 59 | Attending: Internal Medicine

## 2010-05-20 DIAGNOSIS — E785 Hyperlipidemia, unspecified: Secondary | ICD-10-CM | POA: Insufficient documentation

## 2010-05-20 DIAGNOSIS — K219 Gastro-esophageal reflux disease without esophagitis: Secondary | ICD-10-CM | POA: Insufficient documentation

## 2010-05-20 DIAGNOSIS — I6529 Occlusion and stenosis of unspecified carotid artery: Secondary | ICD-10-CM | POA: Insufficient documentation

## 2010-05-20 DIAGNOSIS — R002 Palpitations: Secondary | ICD-10-CM

## 2010-05-20 DIAGNOSIS — I2510 Atherosclerotic heart disease of native coronary artery without angina pectoris: Secondary | ICD-10-CM | POA: Insufficient documentation

## 2010-05-20 DIAGNOSIS — I10 Essential (primary) hypertension: Secondary | ICD-10-CM | POA: Insufficient documentation

## 2010-05-20 DIAGNOSIS — F17200 Nicotine dependence, unspecified, uncomplicated: Secondary | ICD-10-CM | POA: Insufficient documentation

## 2010-05-20 DIAGNOSIS — I7300 Raynaud's syndrome without gangrene: Secondary | ICD-10-CM | POA: Insufficient documentation

## 2010-07-04 ENCOUNTER — Encounter: Payer: Self-pay | Admitting: Cardiology

## 2010-07-04 DIAGNOSIS — I7300 Raynaud's syndrome without gangrene: Secondary | ICD-10-CM | POA: Insufficient documentation

## 2010-07-04 DIAGNOSIS — I739 Peripheral vascular disease, unspecified: Secondary | ICD-10-CM

## 2010-07-04 DIAGNOSIS — G459 Transient cerebral ischemic attack, unspecified: Secondary | ICD-10-CM | POA: Insufficient documentation

## 2010-07-04 DIAGNOSIS — H3410 Central retinal artery occlusion, unspecified eye: Secondary | ICD-10-CM | POA: Insufficient documentation

## 2010-07-05 ENCOUNTER — Encounter: Payer: Self-pay | Admitting: *Deleted

## 2010-07-05 ENCOUNTER — Ambulatory Visit (INDEPENDENT_AMBULATORY_CARE_PROVIDER_SITE_OTHER): Payer: 59 | Admitting: Cardiology

## 2010-07-05 ENCOUNTER — Encounter: Payer: Self-pay | Admitting: Cardiology

## 2010-07-05 DIAGNOSIS — IMO0002 Reserved for concepts with insufficient information to code with codable children: Secondary | ICD-10-CM | POA: Insufficient documentation

## 2010-07-05 DIAGNOSIS — R0989 Other specified symptoms and signs involving the circulatory and respiratory systems: Secondary | ICD-10-CM

## 2010-07-05 DIAGNOSIS — I10 Essential (primary) hypertension: Secondary | ICD-10-CM

## 2010-07-05 DIAGNOSIS — R9430 Abnormal result of cardiovascular function study, unspecified: Secondary | ICD-10-CM | POA: Insufficient documentation

## 2010-07-05 DIAGNOSIS — R002 Palpitations: Secondary | ICD-10-CM

## 2010-07-05 DIAGNOSIS — F17200 Nicotine dependence, unspecified, uncomplicated: Secondary | ICD-10-CM

## 2010-07-05 DIAGNOSIS — Z720 Tobacco use: Secondary | ICD-10-CM

## 2010-07-05 DIAGNOSIS — I2510 Atherosclerotic heart disease of native coronary artery without angina pectoris: Secondary | ICD-10-CM

## 2010-07-05 LAB — CBC WITH DIFFERENTIAL/PLATELET
Basophils Absolute: 0.1 10*3/uL (ref 0.0–0.1)
Basophils Relative: 0.6 % (ref 0.0–3.0)
Eosinophils Absolute: 0.1 10*3/uL (ref 0.0–0.7)
Eosinophils Relative: 1.2 % (ref 0.0–5.0)
HCT: 45.4 % (ref 39.0–52.0)
Lymphs Abs: 2 10*3/uL (ref 0.7–4.0)
MCV: 97.7 fl (ref 78.0–100.0)
Monocytes Absolute: 0.6 10*3/uL (ref 0.1–1.0)
Monocytes Relative: 5.9 % (ref 3.0–12.0)
Neutro Abs: 8.1 10*3/uL — ABNORMAL HIGH (ref 1.4–7.7)
Platelets: 242 10*3/uL (ref 150.0–400.0)
RBC: 4.65 Mil/uL (ref 4.22–5.81)
RDW: 13.6 % (ref 11.5–14.6)
WBC: 10.9 10*3/uL — ABNORMAL HIGH (ref 4.5–10.5)

## 2010-07-05 LAB — BASIC METABOLIC PANEL
BUN: 15 mg/dL (ref 6–23)
CO2: 28 mEq/L (ref 19–32)
Chloride: 105 mEq/L (ref 96–112)
Creatinine, Ser: 0.8 mg/dL (ref 0.4–1.5)
Glucose, Bld: 81 mg/dL (ref 70–99)
Potassium: 5.1 mEq/L (ref 3.5–5.1)
Sodium: 141 mEq/L (ref 135–145)

## 2010-07-05 LAB — APTT: aPTT: 27.5 s (ref 21.7–28.8)

## 2010-07-05 LAB — PROTIME-INR: Prothrombin Time: 11 s (ref 10.2–12.4)

## 2010-07-05 NOTE — Patient Instructions (Signed)
Your physician has requested that you have a cardiac catheterization. Cardiac catheterization is used to diagnose and/or treat various heart conditions. Doctors may recommend this procedure for a number of different reasons. The most common reason is to evaluate chest pain. Chest pain can be a symptom of coronary artery disease (CAD), and cardiac catheterization can show whether plaque is narrowing or blocking your heart's arteries. This procedure is also used to evaluate the valves, as well as measure the blood flow and oxygen levels in different parts of your heart. For further information please visit [email or web address] Please follow instruction sheet, as given. Your physician recommends that you return for lab work in: today

## 2010-07-05 NOTE — Assessment & Plan Note (Signed)
At this time he is not having marked palpitations.  No change in therapy.

## 2010-07-05 NOTE — Progress Notes (Signed)
HPI The patient is seen today for cardiology followup.  I had seen him May 10, 2010.  He had palpitations.  Since then his rhythm has been stable without significant symptoms.  He had a Myoview scan abnormal in the past.  Cardiac catheterization revealed mild irregularity of the LAD.  Two-dimensional echo was done May 20, 2010 to reassess LV function.  It appears there has been a change.  His ejection fraction is 50-55%.  He has inferobasal and distal septal hypokinesis.  He returns today with his wife.  With careful questioning he does give a history of exertional tightness.  It does not occur every day.  His wife is concerned.  He does not have nausea vomiting or diaphoresis. No Known Allergies  Current Outpatient Prescriptions  Medication Sig Dispense Refill  . calcium carbonate (TUMS - DOSED IN MG ELEMENTAL CALCIUM) 500 MG chewable tablet Chew 1 tablet by mouth as needed.        . dipyridamole-aspirin (AGGRENOX) 25-200 MG per 12 hr capsule Take 1 capsule by mouth daily.       . metoprolol tartrate (LOPRESSOR) 25 MG tablet Take 25 mg by mouth daily.       . rosuvastatin (CRESTOR) 20 MG tablet Take 20 mg by mouth daily.        . sildenafil (VIAGRA) 100 MG tablet Take 100 mg by mouth daily as needed.          History   Social History  . Marital Status: Married    Spouse Name: N/A    Number of Children: N/A  . 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 Smoking status: Current Everyday Smoker    Types: Cigarettes  . Smokeless tobacco: Not on file   Comment: 1 pack a day  . Alcohol Use: Not on file  . Drug Use: Yes  . Sexually Active: Not on file   Other Topics Concern  . Not on file   Social History Narrative  . No narrative on file    Family History  Problem Relation Age of Onset  . Hypertension      Past Medical History  Diagnosis Date  . Adenomatous colon polyp   . Palpitations     probable SVT, rate  170,, at home,, lasted one hour,, 2012  . Hypercholesterolemia   . GERD (gastroesophageal reflux disease)   . Alcohol use   . Hypertension   . Raynaud's syndrome   . TIA (transient ischemic attack)     Possible small vessel tia in the past.  . CRAO (central retinal artery occlusion)   . BPH (benign prostatic hypertrophy)   . Carotid artery disease     0-39% R. ICA, 40-59% LICA... Doppler.... January, 2010  /  Doppler... in January, 2011 no change  /  doppler1/27/2012...stable  0-39% RICA 40-59% LICA  . Tobacco abuse   . Antiphospholipid antibody positive     ???In the past???but not proven according to patient  . Thyroid cyst     Removed in the past  . Ejection fraction      EF 50-55%, echo, March, 2012... inferobasal and distal septal hypokinesis /      60%, echo, November, 2008  . CAD (coronary artery disease)     Catheterization 2005, mild LAD irregularity(abnormal Myoview scan  0  . Antiphospholipid antibody positive     ??? in the past, but not proven according to the  patient.    Past Surgical History  Procedure Date  . Cardiac catheterization 06/27/2003    2005.... mild irregularity of the LAD..( patient had had abnormal Myoview scan)  . Inguinal hernia repair     right    ROS  Patient denies fever, chills, headache, sweats, rash, change in vision, change in hearing, cough, nausea vomiting, urinary symptoms.  All other systems are reviewed and are negative.  PHYSICAL EXAM Patient is oriented to person time and place.  Affect is normal.  Head is atraumatic.  There is no xanthelasma.  He has discoloration of the skin compatible with Raynaud's phenomenon.  Lungs are clear.  Respiratory effort is nonlabored.  Cardiac exam reveals S1-S2.  There are no clicks or significant murmurs.  The abdomen is soft.  There is no peripheral edema.  There are no musculoskeletal deformities.  His skin is cool. Filed Vitals:   07/05/10 0940  BP: 124/72  Pulse: 60  Resp: 18  Height: 6\' 2"   (1.88 m)  Weight: 174 lb (78.926 kg)    EKG  No EKG is done today.  ASSESSMENT & PLAN

## 2010-07-05 NOTE — Assessment & Plan Note (Signed)
Blood pressure is under reasonable control. No change in therapy. 

## 2010-07-05 NOTE — Assessment & Plan Note (Signed)
In the past he had only minimal disease.  His most recent echo reveals that he may have had a cardiac event.  Catheterization is being arranged.

## 2010-07-05 NOTE — Assessment & Plan Note (Signed)
Unfortunately he continues to smoke.  Again I have counseled him to stop.

## 2010-07-05 NOTE — Assessment & Plan Note (Signed)
The patient's echo reveals mild decrease in LV function since his prior study.  There appears to be a new focal wall motion abnormality.  He is having some chest discomfort.  I've had a careful discussion with him and his wife about the workup.  He has had a misleading nuclear study in the past.  I feel we should proceed with cardiac catheterization.  He and his wife are in agreement.

## 2010-07-06 ENCOUNTER — Emergency Department (HOSPITAL_COMMUNITY): Payer: 59

## 2010-07-06 ENCOUNTER — Emergency Department (HOSPITAL_COMMUNITY)
Admission: EM | Admit: 2010-07-06 | Discharge: 2010-07-06 | Disposition: A | Discharge status: Home / Self Care | Payer: 59 | Attending: Emergency Medicine | Admitting: Emergency Medicine

## 2010-07-06 DIAGNOSIS — I498 Other specified cardiac arrhythmias: Secondary | ICD-10-CM | POA: Insufficient documentation

## 2010-07-06 DIAGNOSIS — Z8673 Personal history of transient ischemic attack (TIA), and cerebral infarction without residual deficits: Secondary | ICD-10-CM | POA: Insufficient documentation

## 2010-07-06 DIAGNOSIS — Z79899 Other long term (current) drug therapy: Secondary | ICD-10-CM | POA: Insufficient documentation

## 2010-07-06 DIAGNOSIS — E7800 Pure hypercholesterolemia, unspecified: Secondary | ICD-10-CM | POA: Insufficient documentation

## 2010-07-06 DIAGNOSIS — I779 Disorder of arteries and arterioles, unspecified: Secondary | ICD-10-CM | POA: Insufficient documentation

## 2010-07-06 DIAGNOSIS — I10 Essential (primary) hypertension: Secondary | ICD-10-CM | POA: Insufficient documentation

## 2010-07-06 DIAGNOSIS — R002 Palpitations: Secondary | ICD-10-CM | POA: Insufficient documentation

## 2010-07-06 LAB — CK TOTAL AND CKMB (NOT AT ARMC)
CK, MB: 6 ng/mL — ABNORMAL HIGH (ref 0.3–4.0)
Relative Index: 2.9 — ABNORMAL HIGH (ref 0.0–2.5)

## 2010-07-06 LAB — DIFFERENTIAL
Eosinophils Relative: 4 % (ref 0–5)
Lymphocytes Relative: 32 % (ref 12–46)
Lymphs Abs: 2.4 10*3/uL (ref 0.7–4.0)
Monocytes Absolute: 0.6 10*3/uL (ref 0.1–1.0)
Monocytes Relative: 8 % (ref 3–12)
Neutro Abs: 4.2 10*3/uL (ref 1.7–7.7)

## 2010-07-06 LAB — CBC
HCT: 44.1 % (ref 39.0–52.0)
Hemoglobin: 15.8 g/dL (ref 13.0–17.0)
MCH: 33.1 pg (ref 26.0–34.0)
MCHC: 35.8 g/dL (ref 30.0–36.0)
MCV: 92.5 fL (ref 78.0–100.0)
RDW: 13 % (ref 11.5–15.5)

## 2010-07-06 LAB — URINALYSIS, ROUTINE W REFLEX MICROSCOPIC
Ketones, ur: NEGATIVE mg/dL
Nitrite: NEGATIVE
Protein, ur: NEGATIVE mg/dL
Urobilinogen, UA: 0.2 mg/dL (ref 0.0–1.0)
pH: 6 (ref 5.0–8.0)

## 2010-07-06 LAB — POCT CARDIAC MARKERS: Myoglobin, poc: 74.4 ng/mL (ref 12–200)

## 2010-07-06 LAB — BASIC METABOLIC PANEL
CO2: 24 mEq/L (ref 19–32)
Chloride: 108 mEq/L (ref 96–112)
Creatinine, Ser: 0.9 mg/dL (ref 0.4–1.5)
GFR calc Af Amer: >60 mL/min (ref >60)

## 2010-07-06 LAB — TROPONIN I: Troponin I: 0.01 ng/mL (ref 0.00–0.06)

## 2010-07-08 ENCOUNTER — Inpatient Hospital Stay (HOSPITAL_BASED_OUTPATIENT_CLINIC_OR_DEPARTMENT_OTHER)
Admission: RE | Admit: 2010-07-08 | Discharge: 2010-07-08 | Disposition: A | Discharge status: Home / Self Care | Payer: 59 | Source: Ambulatory Visit | Attending: Cardiology | Admitting: Cardiology

## 2010-07-08 DIAGNOSIS — I2582 Chronic total occlusion of coronary artery: Secondary | ICD-10-CM | POA: Insufficient documentation

## 2010-07-08 DIAGNOSIS — R079 Chest pain, unspecified: Secondary | ICD-10-CM | POA: Insufficient documentation

## 2010-07-08 DIAGNOSIS — I2510 Atherosclerotic heart disease of native coronary artery without angina pectoris: Secondary | ICD-10-CM | POA: Insufficient documentation

## 2010-07-20 ENCOUNTER — Telehealth: Payer: Self-pay | Admitting: Physician Assistant

## 2010-07-20 NOTE — Telephone Encounter (Signed)
Patient with h/o CAD and SVT. Patient called with complaints of recurrent palps. Feels rapid and irregular.  Has some chest discomfort.  This is typical for him.  No dyspnea or near syncope. BP 114/.  HR 60-120. Spoke with wife who is a Charity fundraiser.  She said he had run of AFib when he received adenosine in ED recently. He takes aggrenox. Instructed her to give him Lopressor 25 mg x 1. If symptoms still present in one hour or he feels worse, go to ED. She understood all directions and was comfortable with this plan.

## 2010-07-23 ENCOUNTER — Encounter: Payer: Self-pay | Admitting: Cardiology

## 2010-07-23 DIAGNOSIS — I2510 Atherosclerotic heart disease of native coronary artery without angina pectoris: Secondary | ICD-10-CM | POA: Insufficient documentation

## 2010-07-24 ENCOUNTER — Ambulatory Visit (INDEPENDENT_AMBULATORY_CARE_PROVIDER_SITE_OTHER): Payer: 59 | Admitting: Cardiology

## 2010-07-24 ENCOUNTER — Encounter: Payer: Self-pay | Admitting: Cardiology

## 2010-07-24 DIAGNOSIS — I2510 Atherosclerotic heart disease of native coronary artery without angina pectoris: Secondary | ICD-10-CM

## 2010-07-24 DIAGNOSIS — I10 Essential (primary) hypertension: Secondary | ICD-10-CM

## 2010-07-24 DIAGNOSIS — R002 Palpitations: Secondary | ICD-10-CM

## 2010-07-24 NOTE — Assessment & Plan Note (Signed)
Blood pressure is under control.  No change in therapy. 

## 2010-07-24 NOTE — Patient Instructions (Signed)
Your physician has recommended that you wear an event monitor. Event monitors are medical devices that record the heart's electrical activity. Doctors most often [place] these monitors to diagnose arrhythmias. Arrhythmias are problems with the speed or rhythm of the heartbeat. The monitor is a small, portable device. You can wear one while you do your normal daily activities. This is usually used to diagnose what is causing palpitations/syncope (passing out).  21 day Event Monitor Your physician recommends that you schedule a follow-up appointment in: 6 weeks

## 2010-07-24 NOTE — Assessment & Plan Note (Signed)
In the catheter lab there was a burst of supraventricular tachycardia.  In addition at home he had another episode of increased heart rate although was only in the range of 120.  His wife said that it was irregular.  She is a Engineer, civil (consulting).  His CHADS 2  Score is two.  Therefore he would require anticoagulation if we document atrial fibrillation.  At this point it is not proven to be atrial fibrillation.  We will arrange for an event monitor to see if we can document his rhythm.  In addition I've encouraged him to drink less alcohol.

## 2010-07-24 NOTE — Assessment & Plan Note (Signed)
We have not documented that he does have disease of the right coronary artery.  Aggressive secondary prevention is in order. I have spoken to him many times over the years about his smoking.  Today he says that he has begun the process to try to stop.  No further cardiac workup at this time.

## 2010-07-24 NOTE — Progress Notes (Signed)
HPI The patient is seen back in followup for coronary disease and supraventricular tachycardia.  I saw him last in the office on July 05, 2010.  He had some inferobasilar hypokinesis.  Decision was made to proceed with catheterization.  This was done successfully.  He has a total right coronary lesion with good collaterals.  It was also noted during the That he had a burst of supraventricular tachycardia.  He was put on Cardizem.  He did have a brief episode after that outside of the hospital.  The rate is not as fast.  His wife mentions that he does have this at times when he has been out working in the yard and has had more than 5 years to drink. No Known Allergies  Current Outpatient Prescriptions  Medication Sig Dispense Refill  . calcium carbonate (TUMS - DOSED IN MG ELEMENTAL CALCIUM) 500 MG chewable tablet Chew 1 tablet by mouth as needed.        Marland Kitchen CARTIA XT 120 MG 24 hr capsule 1 TAB QD       . dipyridamole-aspirin (AGGRENOX) 25-200 MG per 12 hr capsule Take 1 capsule by mouth 2 (two) times daily.       . rosuvastatin (CRESTOR) 20 MG tablet Take 20 mg by mouth daily.        . sildenafil (VIAGRA) 100 MG tablet Take 100 mg by mouth daily as needed.        . Tamsulosin HCl (FLOMAX) 0.4 MG CAPS 0.4 mg. 1 tab qd       . DISCONTD: atorvastatin (LIPITOR) 40 MG tablet       . DISCONTD: metoprolol tartrate (LOPRESSOR) 25 MG tablet Take 25 mg by mouth daily.         History   Social History  . Marital Status: Married    Spouse Name: N/A    Number of Children: N/A  . 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 Smoking status: Current Everyday Smoker    Types: Cigarettes  . Smokeless tobacco: Not on file   Comment: 1 pack a day  . Alcohol Use: Not on file  . Drug Use: Yes  . Sexually Active: Not on file   Other Topics Concern  . Not on file   Social History Narrative  . No narrative on file    Family History  Problem  Relation Age of Onset  . Hypertension      Past Medical History  Diagnosis Date  . Adenomatous colon polyp   . Palpitations     probable SVT, rate 170,, at home,, lasted one hour,, 2012  . Hypercholesterolemia   . GERD (gastroesophageal reflux disease)   . Alcohol use   . Hypertension   . Raynaud's syndrome   . TIA (transient ischemic attack)     Possible small vessel tia in the past.  . CRAO (central retinal artery occlusion)   . BPH (benign prostatic hypertrophy)   . Carotid artery disease     0-39% R. ICA, 40-59% LICA... Doppler.... January, 2010  /  Doppler... in January, 2011 no change  /  doppler1/27/2012...stable  0-39% RICA 40-59% LICA  . Tobacco abuse   . Antiphospholipid antibody positive     ???In the past???but not proven according to patient  . Thyroid cyst     Removed in the past  . Ejection fraction      EF 50-55%, echo, March, 2012.Marland KitchenMarland Kitchen  inferobasal and distal septal hypokinesis /      60%, echo, November, 2008  . CAD (coronary artery disease)     Catheterization 2005 disease / catheterization July 06, 2010, chronic total occlusion distal RCA with collaterals from right and left side.  Medical therapy recommended, mild decreased LV function       . Antiphospholipid antibody positive     ??? in the past, but not proven according to the patient.    Past Surgical History  Procedure Date  . Cardiac catheterization 06/27/2003    2005.... mild irregularity of the LAD..( patient had had abnormal Myoview scan)  . Inguinal hernia repair     right    ROS  Patient denies fever, chills, headache, sweats, rash, change in vision, change in hearing, chest pain, cough, nausea vomiting, urinary symptoms.  All other systems are reviewed and are negative.  PHYSICAL EXAM Patient is oriented to person time and place.  Affect is normal.  He is here with his wife today.  Head is atraumatic.  There is no xanthelasma.  His skin has the appearance of diffuse reddening but could be  related to Raynaud's.  This has not changed.  Lungs are clear.  Respiratory effort is unlabored.  Cardiac exam reveals S1-S2.  No clicks or significant murmurs.  Abdomen is soft.  No peripheral edema.  He is having no difficulty from his right groin catheter site. Filed Vitals:   07/24/10 1126  BP: 131/72  Pulse: 64  Height: 6\' 2"  (1.88 m)  Weight: 173 lb (78.472 kg)    EKG  EKG done today shows sinus rhythm.  ASSESSMENT & PLAN

## 2010-07-30 NOTE — Op Note (Signed)
NAME:  AZEKIEL, CREMER                 ACCOUNT NO.:  [id number]   MEDICAL RECORD NO.:  [id number]          PATIENT TYPE:  AMB   LOCATION:  SDS                          FACILITY:  [facility]   PHYSICIAN:  Ollen Gross. Vernell Morgans, M.D. DATE OF BIRTH:  [birth year]   DATE OF PROCEDURE:  01/07/2008  DATE OF DISCHARGE:  01/07/2008                               OPERATIVE REPORT   PREOPERATIVE DIAGNOSIS:  Right inguinal hernia.   POSTOPERATIVE DIAGNOSES:  Right direct inguinal hernia.   PROCEDURE:  Right inguinal hernia repair with mesh.   SURGEON:  Ollen Gross. Vernell Morgans, MD   ANESTHESIA:  General via LMA.   PROCEDURE:  After informed consent was obtained, the patient was brought  to the operating room, placed in the supine position on the operating  table.  After adequate induction of general anesthesia, the patient's  abdomen and right groin were prepped with Betadine and draped in usual  sterile manner.  The right groin area was infiltrated with 0.25%  Marcaine.  A small incision was made from the edge of the pubic tubercle  on the right towards the anterior cephalic spine.  This incision was  carried down through the skin and subcutaneous tissue sharply with the  electrocautery until the fascia of the external oblique was encountered.  A small bridging vein was clamped with hemostats, divided, and ligated  with 3-0 silk ties.  The external oblique fascia was opened along its  fibers towards the apex of the external ring with a 15-blade knife and  Metzenbaum scissors.  Then, a retractor was deployed.  The ilioinguinal  nerve was identified.  It was involved with some scar tissue, it was  therefore dissected free and clamped proximally and distally with  hemostats, divided, and ligated with 3-0 silk ties.  Blunt dissection  was carried out of the cord structures at the edge of the pubic tubercle  until they could be surrounded between two fingers.  A 1/2-inch Penrose  drain was placed around the cord  structures for retraction purposes.  The cord was gently skeletonized.  There did not appear to be any  evidence of hernia sac with the cord.  There was a general weakness and  bulging to the floor with the inguinal canal consistent with a direct  hernia.  The floor of the canal was then repaired with a running 0  Vicryl stitch.  The tails of the Vicryl stitch were left long near the  edge of the cord.  A 3 x 6 piece of Ultrapro mesh was then chosen and  cut to fit.  The mesh was sewed inferiorly to the shelving edge of the  inguinal ligament with a running 2-0 Prolene stitch.  Tails were cut in  the mesh laterally.  The tails were wrapped around the cord structures  superiorly.  The mesh was sewed to the muscular aponeurotic strength  layer of the transversalis with interrupted 2-0 Prolene vertical  mattress stitches.  The tails lateral to the cord were anchored, also to  the shelving edge of inguinal ligament with interrupted 2-0 Prolene  stitches.  Once this was accomplished, the mesh appeared to be in good  position without any tension.  The wound was irrigated with copious  amounts of saline.  The external oblique fascia was then closed with  running 2-0 Vicryl stitch.  The wound was infiltrated with more 0.25%  Marcaine.  The subcutaneous fascia was closed with a running 3-0 Vicryl  stitch and the skin was closed with a running 4-0 Monocryl subcuticular  stitch.  Dermabond dressing was applied.  The patient tolerated the  procedure  well.  At the end of the case, all needle, sponge, and instrument counts  were correct.  The patient was then awakened, taken to the recovery room  in stable condition.  The patient's testicle was in the scrotum at the  end of the case.      Ollen Gross. Vernell Morgans, M.D.  Electronically Signed     PST/MEDQ  D:  01/10/2008  T:  01/10/2008  Job:  [id number]

## 2010-07-30 NOTE — Assessment & Plan Note (Signed)
[facility]                            CARDIOLOGY OFFICE NOTE   NAME:[name], [name]                          MRN:          [id number]  DATE:01/11/2007                            DOB:          05-12-1950    Mr. [name] is seen for cardiology followup.  He has not had any  significant cardiac problems, although he does describe 1 episode of 30  minutes of increased heart rate.  He and his wife think his heart rate  may have been in the range of 140.  He did not have any chest pain.  There was no syncope or pre-syncope.  He has gone about usual  activities.  He has had a retinal artery occlusion.  He has seen Dr.  Thad Ranger and Dr. Arbie Cookey.  He does have Raynaud's phenomenon.  He is on  Aggrenox.  His cardiac workup in the past showed that he had question of  an abnormality on a nuclear scan.  Cardiac cath showed mild irregularity  in his LAD.  He had some disease in his right coronary artery that was  small.  His inferior wall was perfused by multiple small branching  vessels without an obvious significant posterior descending.   PAST MEDICAL HISTORY:   ALLERGIES:  No known drug allergies.   MEDICATIONS:  Depakote.  Aggrenox.  Metoprolol.  Crestor.   OTHER MEDICAL PROBLEMS:  See the list below.   REVIEW OF SYSTEMS:  Other than the HPI, the review of systems is  negative.   PHYSICAL EXAM:  Weight 174 pounds, blood pressure 130/82 with a pulse of  69.  The patient is oriented to person, time, and place.  Affect is normal.  Hands are cool.  He has a reddish tint to his hands and feet, compatible  with his Raynaud's phenomenon.  HEENT:  Reveals no xanthelasma.  He has normal extraocular motions.  He  does have a right carotid bruit.  There is no jugular venous distension.  LUNGS:  Clear.  Respiratory effort is not labored.  CARDIAC:  Reveals an S1 with an S2.  He has no clicks or significant  murmur.  ABDOMEN:  Soft.  He has no masses or bruits.  He has  normal bowel  sounds.  His feet are cool, compatible with his Raynaud's.   LABS:  EKG reveals decreased R in V3.  He has had this intermittently.   PROBLEMS:  1. History of an abnormal Myoview in the past, questioning some      inferior hypokinesis, although his 2D echo showed normal left      ventricular function and his catheterization showed normal left      ventricular function.  2. Ongoing tobacco use.  It is extremely important for him to stop      smoking.  He is aware of this and we talked about it.  3. Abnormal carotid Dopplers.  He had an angiogram in February of      2007.  There was some out-pouching and posterior plaque in his left      carotid.  There  was no source of embolus.  I encouraged him to also      see Dr. Arbie Cookey in followup.  He is seeing Dr. Thad Ranger.  4. History of a thyroid cyst.  5. Raynaud's phenomenon.  6. History of hypertension.  7. Regular alcohol use in the past.  8. Question of antiphospholipid antibody in the past, but this has not      been proven according to the patient.  9. Gastroesophageal reflux disease.  10.Question of a possible small vessel transient ischemic attack in      the past.  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  12.Elevated cholesterol on Crestor.  13.Recent palpitations.   At this point it is most likely that this is brief supraventricular  arrhythmia.  I will be arranging for a followup 2D echo so that we can  be sure his left ventricular function is normal.  I have encouraged him  to go about usual activities.  If his left ventricular function is  normal, there will be no change in the approach to his care.  If he has  left ventricular dysfunction, he will need to be seen for followup.  This will be arranged.     Luis Abed, MD, [facility]  Electronically Signed    JDK/MedQ  DD: 01/11/2007  DT: 01/12/2007  Job #: [id number]   cc:   Casimiro Needle L. Thad Ranger, M.D.  Larina Earthly,  M.D.

## 2010-07-30 NOTE — Assessment & Plan Note (Signed)
[facility]                            CARDIOLOGY OFFICE NOTE   NAME:Marchi, Johnmichael                          MRN:          [id number]  DATE:04/06/2008                            DOB:          04/11/1950    Mr. Ricketson is doing well.  We know he has good LV function.  He had an  episode with retinal artery occlusion in the past.  He is on Aggrenox.  He does have some mild carotid disease.  He is doing well.  He is not  having any chest pain.  The patient does have mild palpitations.  He has  had 2 or 3 episodes in a year.  They are short-lived.  At this time, he  and I had a full discussion and decided not to pursue a workup of this  any further.   PAST MEDICAL HISTORY:   ALLERGIES:  No known drug allergies.   MEDICATIONS:  Aggrenox, metoprolol, Crestor, multivitamin.   OTHER MEDICAL PROBLEMS:  See the list below.   REVIEW OF SYSTEMS:  He is not having any GI or GU complaints.  He is not  having any chest pain.  He has no fevers or chills or headaches or skin  rashes.  Otherwise, his review of systems is negative.   PHYSICAL EXAMINATION:  VITAL SIGNS:  Blood pressure is 122/70 with pulse  of 60.  The patient is oriented to person, time, and place.  Affect is  normal.  HEENT:  No xanthelasma.  He has normal extraocular motion.  There is are  no carotid bruits.  There is no jugular venous distention.  LUNGS:  Clear.  Respiratory effort is not labored.  CARDIAC:  S1 with an S2.  There are no clicks or significant murmurs.  ABDOMEN:  Soft.  He has no peripheral edema.  He has a reddish tint to  his skin and discoloration at the tip of his nose and he has a history  of Raynaud's phenomenon.   PROBLEMS:  1. History of an abnormal Myoview, but 2-D echo revealing normal left      ventricular function and the cath showing only mild irregularity of      the left anterior descending.  2. Ongoing tobacco use.  I again talked to him about trying to stop.   As always he will continue to think about.  3. Carotid Doppler abnormalities.  He had Dopplers done today and      there is no significant change.  The Dopplers reveal 0-39% right      internal carotid artery stenosis and 40-59% left internal carotid      artery stenosis.  This is stable.  He needs followup in a year.  4. History of a thyroid cyst.  5. Raynaud's phenomenon.  6. Hypertension treated.  7. Regular alcohol use in the past.  8. Question of antiphospholipid antibody in the past, but this has not      been proven according to the patient.  9. Gastroesophageal reflux disease.  10.History of a possible small vessel transient ischemic attack in the  past.  11.Transcervical excision of a thyroglossal duct cyst in 2005.  12.Elevated cholesterol treated.  13.Some palpitations.  As described above we will continue to follow      this.  14.History of a retinal artery occlusion for which the patient is on      Aggrenox.  His overall cardiac status is stable.  I have not      changed his medicines.  I will see him back in 1 year.     Luis Abed, MD, [facility]  Electronically Signed    JDK/MedQ  DD: 04/06/2008  DT: 04/07/2008  Job #: [id number]

## 2010-07-30 NOTE — Assessment & Plan Note (Signed)
[facility]                            CARDIOLOGY OFFICE NOTE   NAME:[name], [name]                          MRN:          [id number]  DATE:04/07/2007                            DOB:          [birth year]    [name] is seen for cardiac follow-up.  He is doing well.  I saw him  last on January 11, 2007.  He has been stable.  We decided to proceed  with a 2-D echo at that time to be sure that his LV function was normal.  His ejection fraction was 60%.  There were no focal wall motion  abnormalities.  There were no significant valvular abnormalities.  He is  feeling well since then.  With this in mind, I am not arranging for any  further cardiac workup at this point.  He is stable.  He had had an  episode of retinal artery occlusion and he was seen by Dr. Thad Ranger and  Dr.  Arbie Cookey.  He has Raynaud's.  He is on Aggrenox.  He underwent cardiac  catheterization with some mild irregularity of his LAD.  There was mild  disease of right coronary artery.  He had an __________  and posterior  plaque in the left carotid.  There was no obvious source of embolus.  He  has not seen Dr. Arbie Cookey for follow-up since that time.   PAST MEDICAL HISTORY:   ALLERGIES:  NO KNOWN DRUG ALLERGIES.   MEDICATIONS:  1. Aggrenox.  2. Metoprolol.  3. Crestor 20.   OTHER MEDICAL PROBLEMS:  See the list below.   REVIEW OF SYSTEMS:  He is feeling well and having no significant  problems.   PHYSICAL EXAM:  Blood pressure is 128/66.  Heart rate is 69.  The  patient is oriented to person, time and place.  Affect is normal.  HEENT:  Reveals no xanthelasma.  He has normal extraocular motion.  He  does have a reddish tinge to his skin color.  Lungs are clear.  Respiratory effort is not labored.  There is a right  carotid bruit.  Cardiac exam reveals S1-S2.  There are no clicks or significant murmurs.  The abdomen is soft.  There is no significant peripheral edema.  Feet are cool by  history and  this is compatible with his Raynaud's.  Unfortunately, he continues to  smoke and he knows that this absolutely would be best for him to stop.   Problems include  1. History of abnormal Myoview in the past with question of inferior      hypokinesis.  A 2-D echo shows continued normal left ventricular      function and his cath showed only mild irregularity of the left      anterior descending artery.  2.  Ongoing tobacco use and as always      he is encouraged to stop.  2. Abnormal carotid Dopplers with workup in the past.  His actual      Doppler was last done in August of 2006 and we will repeat this at  this time.  He is always encouraged to see Dr. Thad Ranger in follow-      up.  3. History of thyroid cyst.  4. Raynaud's phenomenon.  5. Hypertension, controlled.  6. Regular alcohol use in the past.  7. Question of antiphospholipid antibody in the past, but has not been      proven according to the patient.  8. Gastroesophageal reflux disease.  9. Question of possible small vessel transient ischemic attack in the      past.  10.Transcervical excision of a thyroglossal duct cyst in 2005.  11.Cholesterol elevation on Crestor.  12.Some palpitations that have been stable.   Overall, I think he is stable.  We will do a carotid Doppler.     Luis Abed, MD, [facility]  Electronically Signed    JDK/MedQ  DD: 04/07/2007  DT: 04/07/2007  Job #: [id number]   cc:   Casimiro Needle L. Thad Ranger, M.D.  Larina Earthly, M.D.

## 2010-07-30 NOTE — Assessment & Plan Note (Signed)
[facility]                         GASTROENTEROLOGY OFFICE NOTE   NAME:Cross Cross                          MRN:          [id number]  DATE:09/21/2006                            DOB:          Mar 09, 1951    REFERRING PHYSICIAN:  Corwin Levins, MD   REASON FOR REFERRAL:  Colorectal cancer for routine risk patient, had  elevated risk for ongoing blood thinner.   HISTORY OF PRESENT ILLNESS:  Mr. Cross Cross is a very pleasant 60 year old  man who had either a stroke or a complex migraine headache in 2006, and  since then has been on Aggrenox as well as Depakote more recently.  He  has never had colorectal cancer screen with a colonoscopy.  He as no  constipation, diarrhea or blood in his stools.  [name] to come  to discuss colonoscopy.   REVIEW OF SYSTEMS:  Notable for stable weight.  No overt GI bleeding.  Otherwise, essentially normal and available on the nurses out take  sheet.   PAST MEDICAL HISTORY:  1. Hypertension.  2. Elevated cholesterol.  3. Question stroke.  Cared for by Dr. Thad Cross from Neurology.  4. Thyroid cyst removed in 2006.   CURRENT MEDICATIONS:  1. Depakote 500 mg nightly.  2. Aggrenox.  3. Metoprolol.  4. Crestor.   ALLERGIES:  No known drug allergies.   SOCIAL HISTORY:  Married with two sons, works at [employer].  Smokes one pack  of cigarettes per day.  Drinks 2-3 beers daily and more on the weekends.   FAMILY HISTORY:  He is adopted.   PHYSICAL EXAMINATION:  VITAL SIGNS:  Height 6 feet 2 ounces, 172 pounds,  blood pressure 132/78, pulse 84.  CONSTITUTIONAL:  Generally well-appearing.  NEUROLOGICAL:  Alert and oriented x3.  HEENT:  Eyes:  Extraocular movements intact.  Oropharynx moist.  No  lesions.  NECK:  Supple.  No lymphadenopathy.  CARDIOVASCULAR:  Heart regular rate and rhythm.  LUNGS:  Clear to auscultation bilaterally.  ABDOMEN:  Soft, nontender, nondistended.  Normal bowel sounds.  EXTREMITIES:  No  lower extremities.  SKIN:  No rash or lesions are visible.   ASSESSMENT/PLAN:  A 60 year old man at likely routine risk for  colorectal cancer (the patient was adopted) on chronic Aggrenox for  question stroke in 2006.  We will get in touch with his neurologist, Dr.  Thad Cross, to see if he is comfortable with [place] holding his Aggrenox for  seven days prior to a colonoscopy that we will schedule at his soonest  convenience.  I see no reason for any further blood tests or imaging  studies prior to them.     Cross Fee, MD  Electronically Signed    DPJ/MedQ  DD: 09/21/2006  DT: 09/22/2006  Job #: [id number]   cc:   Cross Needle L. Cross Cross, M.D.  Dennis Levins, MD

## 2010-08-01 NOTE — Cardiovascular Report (Signed)
  NAME:  [name], MASSMAN NO.:  [id number]  MEDICAL RECORD NO.:  [id number]           PATIENT TYPE:  E  LOCATION:  [facility]                         FACILITY:  [facility]  PHYSICIAN:  Marca Ancona, MD      DATE OF BIRTH:  03/10/1951  DATE OF PROCEDURE: DATE OF DISCHARGE:  07/06/2010                           CARDIAC CATHETERIZATION   PROCEDURE: 1. Left heart catheterization. 2. Coronary angiography 3. Left ventriculography.  INDICATION:  This is a 60 year old who has a history of chest pain and tightness and also had an abnormal echocardiogram suggesting basal inferior wall motion abnormality.  Given these symptoms, he is set up for a left heart catheterization.  PROCEDURE NOTE:  After informed consent was obtained, the right groin was sterilely prepped and draped.  A 1% lidocaine was used to locally anesthetize the right coronary artery.  The right common femoral artery was entered using modified Seldinger technique and a 4-French arterial sheath was placed.  The left coronary artery was engaged using the JL-4 catheter.  The right coronary artery was engaged using 3-D RCA catheter and the left ventricle was entered using angled pigtail catheter.  There were no complications.  FINDINGS: 1. Hemodynamics:  Aorta 123/52, LV 120/12. 2. Left ventriculography.  EF was estimated to be 50%.  The basal     inferior wall was akinetic and the mid inferior wall was     hypokinetic. 3. Right coronary artery.  The right coronary artery had a 40%     midvessel stenosis.  There was a moderate-sized acute marginal.     The distal right coronary artery was totally occluded.  There were     right to right collaterals to the PDA as well as left-to-right     collaterals from the circumflex system to the PDA. 4. Left main.  The left main had no angiographic coronary disease. 5. Left circumflex system.  There was a large branching obtuse     marginal.  This vessel had about 30% proximal  stenosis, otherwise     no significant disease in the circumflex system. 6. LAD system.  The LAD itself had only luminal irregularities.  There     was a small to moderate first diagonal that had a about 40%     proximal stenosis.  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  Plan will be medical treatment.  His EF was mildly decreased. Given his runs of supraventricular tachycardia, I am going to change his beta-blocker to Toprol XL 25 mg twice a day.     Marca Ancona, MD     DM/MEDQ  D:  07/08/2010  T:  07/08/2010  Job:  [id number]  cc:   Dario Guardian, M.D. Luis Abed, MD, [facility]  Electronically Signed by Marca Ancona MD on 08/01/2010 11:38:55 PM

## 2010-08-02 NOTE — Op Note (Signed)
NAME:  [name], ANTRIM NO.:  [id number]   MEDICAL RECORD NO.:  [id number]                   PATIENT TYPE:  OIB   LOCATION:  2899                                 FACILITY:  [facility]   PHYSICIAN:  Kinnie Scales. Annalee Genta, M.D.            DATE OF BIRTH:  1950-07-09   DATE OF PROCEDURE:  06/15/2003  DATE OF DISCHARGE:                                 OPERATIVE REPORT   PREOPERATIVE DIAGNOSIS:  Midline neck mass.   POSTOPERATIVE DIAGNOSIS:  Thyroglossal duct cyst.   OPERATION PERFORMED:  Transcervical excision of thyroglossal duct cyst with  Sistrunk procedure.   SURGEON:  Kinnie Scales. Annalee Genta, M.D.   ASSISTANTEnrigue Catena H. Pollyann Kennedy, M.D.   ANESTHESIA:  General endotracheal.   COMPLICATIONS:  None.   ESTIMATED BLOOD LOSS:  Less than 100 mL.   DISPOSITION:  The patient was transferred from the operating room to the  recovery room in stable condition.   INDICATIONS FOR PROCEDURE:  Mr. Stracke is a 60 year old white male who was  referred for evaluation a gradually enlarging anterior neck mass.  The  patient had a longstanding history of an asymptomatic mass.  Over the  preceding several months he has had at least two episodes acute infection  with erythema, swelling and tenderness.  He has been treated with several  courses of antibiotics with some slight resolution in the size of the mass.  CT scan was performed which showed a large cystic mass occupying the  anterior neck space immediately adjacent to the thyroid cartilage.  Differential diagnosis include possible thyroglossal duct cyst versus  laryngocele.  Given the patient's history and examination, I recommended  that we undertake surgical excision of the mass under general anesthesia.  The risks, benefits and possible complications of the procedure were  discussed in detail with the patient and his wife who understood and  concurred with our plan for surgery.  Prior to surgery, cardiac clearance  was  obtained through Dr. Henrietta Hoover office.  The patient had significant EKG  findings consistent with a possible old myocardial infarction.  He was  stable and was cleared for the above surgical procedure.   DESCRIPTION OF PROCEDURE:  The patient was brought to the operating room on  June 15, 2003 and placed in supine position on the operating table.  General endotracheal anesthesia was established without difficulty and when  the patient was adequately anesthetized, his anterior neck was examined and  he was found to have an approximately 3 cm cystic mass involving the soft  tissue in the midline of the neck adjacent to the thyroid cartilage.  The  patient's skin was injected with 3 mL of lidocaine 1:100,000 dilution  epinephrine injected in a subcutaneous fashion in a pre-existing skin crease  at the proposed surgical incision line.  The patient was prepped and draped  in sterile fashion.  Surgical incision was demarcated and incision was  created with a #15 scalpel blade which was carried through the skin and  underlying subcutaneous tissue.  Anterior aspect of the platysma muscle were  identified and divided with a #15 scalpel and subplatysmal dissection was  carried out superiorly, inferiorly with Bovie electrocautery.  The midline  of the neck was identified and strap muscles were lateralized.  The thyroid  cartilage was identified and dissection was carried superior to this area  where a large approximately 3 cm cystic mass was identified.  The mass was  adherent to the overlying strap muscles which were separated.  The  dissection was then carried out around the mass separating it from the  surrounding soft tissues. It was not adherent to the laryngeal cartilage and  was not a laryngocele.  At this point it was obvious that the mass was a  thyroglossal duct cyst and dissection was then carried out from inferior to  superior removing the mass including surrounding soft tissues.  The  hyoid  bone was then palpated and dissection was carried out along the superior  aspect of the hyoid bone.  The middle third of the hyoid bone was  skeletonized of the overlying strap muscles and a throughcut bone rongeur  was used to transect the middle one third of the hyoid bone including the  cyst.  Lateral aspects were then debrided and there was no evidence of a  cyst or infection.  Dissection was then carried out through the pre-  epiglottic space, superiorly into the tongue base.  A small fibrous tract  was followed and this was resected as high up on the tongue base as  possible.  There was no evidence of cyst or discharge or infection.  The  superior most aspect of the mass was then divided and suture ligated and  removed from the neck.  A small inferior component was traced to the level  of the cricoid cartilage where dissection was completed.  The patient's  wound was then thoroughly irrigated with saline solution.  A 7 mm Blake  drain was placed at the base of the incision through a separate stab  incision and sutured in position with a 2-0 silk suture.  The wound was then  closed in multiple layers with reapproximation of the strap muscles and  platysma muscle with 3-0 Vicryl suture.  The deep subcutaneous tissue was  closed with a 4-0 Vicryl suture and final skin closure was achieved with 5-0  Ethilon suture.  The patient's wound was dressed with bacitracin ointment.  He was awakened from his anesthetic, extubated and transferred from the  operating room to the recovery room in stable condition.  There were no  complications.  The estimated blood loss was less than 100 mL.                                               Kinnie Scales. Annalee Genta, M.D.    DLS/MEDQ  D:  16/12/9602  T:  06/15/2003  Job:  [id number]

## 2010-08-02 NOTE — Consult Note (Signed)
NAME:  [name], ROSENSTEEL                 ACCOUNT NO.:  [id number]   MEDICAL RECORD NO.:  [id number]          PATIENT TYPE:  EMS   LOCATION:  MAJO                         FACILITY:  [facility]   PHYSICIAN:  Michael L. Reynolds, M.D.DATE OF BIRTH:  1950-08-15   DATE OF CONSULTATION:  01/28/2006  DATE OF DISCHARGE:                                   CONSULTATION   REFERRING PHYSICIAN:  [facility].   CHIEF COMPLAINT:  Code stroke.   HISTORY OF PRESENT ILLNESS:  This is the inpatient consultation evaluation  of this existing [facility] patient.  A 60 year old man  familiar to me from previous emergency room visits.  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  He has had an extensive workup including MRI of the brain, which is normal,  MRA of the intracranial circulation, which is unremarkable, and angiography  of the cerebral and carotid circulation, which basically showed no  abnormalities, although does have some pouching of the right internal  carotid artery without significant stenosis.  He also has history of  antiphospholipid antibody syndrome, for which he has been anticoagulated in  the past, although he has been off of that for about 6 months.  The patient  comes in today with some more stereotypical episodes.  He says that he was  working today, at about 11:15, when he had a visual change he describes at  things in my vision moving.  He knew that they were stationary but he had  a sense that they were moving around.  He did not have any color changes or  other scotomata.  He did not have any visual loss.  Around that time, he had  a dull headache.  He sat down to eat lunch, thinking that  might help him  feel better, but his symptoms and his headache persisted and, at that point,  he called his wife and was taken by a coworker to the [facility] Emergency  Department.  He was noted to be a little bit confused en route, not knowing  [place] [address].  He also claimed to be nauseated en route.  At  the present time, he is feeling a lot better.  He does say that, along with  his symptoms, he did have a little bit of right hand numbness, but that has  basically resolved.  His headache is improved.  There was no associated loss  of consciousness.  No definite focal weakness.   PAST MEDICAL HISTORY:  Remarkable for the antiphospholipid antibody syndrome  as above as well as a retinal stroke.  Also a history of hypertension,  hyperlipidemia, and noncritical coronary artery disease.   SOCIAL HISTORY:  He continues to smoke and is also a heavy alcohol consumer  per his wife.  He works as a land  Printmaker.   FAMILY HISTORY:  He is adopted.   MEDICATIONS:  Aggrenox, Toprol, Lipitor.   REVIEW OF SYSTEMS:  A full 10-system review of systems is negative except as  outlined in the HPI and in the emergency room records, which are reviewed.  He has had no significant chest pain, shortness of breath, palpitations.   PHYSICAL EXAMINATION:  VITAL SIGNS:  Blood pressure 138/71, pulse 74,  respirations 18.  GENERAL:  This is a healthy-appearing man supine in the hospital bed in no  evident distress.  HEAD:  Cranium is normocephalic and atraumatic.  Oropharynx is benign.  NECK:  Supple without carotid or supraclavicular bruits.  CHEST:  Clear to auscultation bilaterally.  NEUROLOGIC:  He is awake, alert, and fully oriented to time, place, and  person.  Recent and remote memory are intact.  Attention span,  concentration, fund of knowledge are all normal on full testing.  He is able  to name objects and repeat phrases.  Mood is euthymic and affect is  appropriate.  Cranial nerves:   Pupils are small but reactive.  Extraocular  movements are full without nystagmus.  Visual fields are full to  confrontation and hearing is intact to conversational speech.  Facial  sensation is intact to pinprick.  Face, tongue, and palate moved normally  and symmetrically.  MOTOR:  Normal bulk and tone.  Normal strength in all extensor extremity  muscles.  Sensation intact to light touch and pinprick throughout.  Coordination:  Rapid movements are performed well.  Finger to nose and heel  to shin are performed well.  Gait:  Deferred.  Reflexes symmetric.  Toes are  downgoing bilaterally.   LABORATORY DATA:  CT of the head is personally reviewed.  The study  demonstrates no acute abnormality.   IMPRESSION:  Transient visual symptoms, headache, and right-sided numbness.  He has had extensive transient ischemic attack workup in the past, which has  been negative, and I doubt that is what this represents.  There is question  whether the symptoms may be due to migraine or possibly due to seizure.   RECOMMENDATIONS:  Will check an MRI of the brain.  If negative, I think it  is okay for him to be discharged on his medications along with Depakote 250  mg q.h.s. as a migraine prophylactic.  We will subsequently arrange for an  outpatient EEG and a followup in the office.   Thank you for the consultation.      Michael L. Thad Ranger, M.D.  Electronically Signed     MLR/MEDQ  D:  01/28/2006  T:  01/29/2006  Job:  [id number]

## 2010-08-02 NOTE — Consult Note (Signed)
NAME:  [name], [name] NO.:  [id number]   MEDICAL RECORD NO.:  [id number]          PATIENT TYPE:  OUT   LOCATION:  XRAY                         FACILITY:  [facility]   PHYSICIAN:  Dorothea Glassman., M.D.  DATE OF BIRTH:  Dec 23, 1950   DATE OF CONSULTATION:  05/05/2005  DATE OF DISCHARGE:  05/05/2005                                   CONSULTATION   PROCEDURE:  Cerebral angiogram.   COMMENT:  Catheterization and injection of contrast was performed by Dr.  Gretta Began.   I will perform interpretation of the intracranial portion of the study; Dr.  Arbie Cookey will interpret the cervical portion of the study in a separate report.   INTERPRETATION:   RIGHT VERTEBRAL ARTERY -- 2 VIEWS:  There is fetal origin of the right  posterior cerebral artery.  The right P1 segment is hypoplastic and there is  no significant filling of the posterior cerebral artery on the vertebral  injection as it fills from the carotid.  There is some partial filling of  the basilar and left posterior cerebral artery, which are not fully  opacified without significant abnormality identified.   LEFT VERTEBRAL ARTERY -- 2 VIEWS:  There is filling of the basilar and left  posterior cerebral artery and some slight filling of the right posterior  cerebral artery.  The right P1 segment is hypoplastic and it fills primarily  from the right carotid injection.   RIGHT CAROTID -- 2-VIEW INTRACRANIAL:  On the AP view, there is filling of  the right posterior cerebral artery, which has fetal origin from the  carotid.  On the lateral view, there is a question of an aneurysm of the  right P1-P2 junction which cannot be seen on the AP view.  Review of the  prior MRA from March 13, 2005 reveals no aneurysm in this area; I think  this is due to an overlying vessel loop.  Oblique views were not obtained to  clarify this by angiogram, but I think correlation of the angiogram with the  MRA shows there is no aneurysm in  this area.  There also is a loop of the  middle cerebral artery vessels in the sylvian fissure without aneurysm.  No  significant stenosis is identified in the right middle or anterior cerebral  artery.   LEFT CAROTID -- 2-VIEW INTRACRANIAL:  The left middle and anterior cerebral  arteries fill and appear normal.  There is no aneurysm.   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  There is  a vascular loop in the junction of the P1 and P2 segment on the right  probably related to the fetal origin causing the appearance of an aneurysm;  however, review of the prior MRA reveals no aneurysm in this area.   No significant intracranial stenosis is identified.           ______________________________  Dorothea Glassman., M.D.     DC/MEDQ  D:  05/07/2005  T:  05/08/2005  Job:  [id number]

## 2010-08-02 NOTE — H&P (Signed)
NAME:  [name], [name] NO.:  [id number]   MEDICAL RECORD NO.:  [id number]          PATIENT TYPE:  EMS   LOCATION:  MAJO                         FACILITY:  [facility]   PHYSICIAN:  Wanda Plump, MD [facility]    DATE OF BIRTH:  16-Nov-1950   DATE OF ADMISSION:  03/12/2005  DATE OF DISCHARGE:                                HISTORY & PHYSICAL   PRIMARY CARE PHYSICIAN:  Corwin Levins, M.D.   CHIEF COMPLAINT:  Headache and dizziness.   HISTORY OF PRESENT ILLNESS:  [name] is a 60 year old gentleman with a  history of antiphospholipid syndrome, on Coumadin, who at around 1:30 p.m.  today felt dizzy, had a headache.  That prompted a visit to urgent care and  subsequently transferred to the emergency room.  The dizziness is described  as lightheadedness and off balance.  It is hard for the patient to describe  it any further.  In the emergency room, he was evaluated by neurology, who  recommended admission by the primary care team for observation and further  workup.   PAST MEDICAL HISTORY:  1.  Status post thyroglossal duct removal in March, 2005.  2.  In April, 2005, had a cardiac catheterization done because of previous      Myoview stress test showing old scar.  The cardiac catheterization      showed nonobstructive coronary artery disease.  3.  Was diagnosed with antiphospholipid syndrome in August, 2006 after he      developed a clot in the right eye, according to the patient.  4.  Hypertension.  5.  High cholesterol.   FAMILY HISTORY:  Unknown because the patient is adopted.   SOCIAL HISTORY:  He smoked 1 to 1-1/2 packs per day and drinks alcohol  daily.   MEDICATIONS:  1.  Crestor 20.  2.  Aspirin 81 mg once daily.  3.  Toprol XL 25 1 p.o. daily.  4.  Coumadin 2.5 mg daily except two days a week when he takes 5 mg.   ALLERGIES:  No known drug allergies.   PHYSICAL EXAMINATION:  VITAL SIGNS:  Afebrile.  Pulse 81, blood pressure  172/91.  Respirations 20.  GENERAL:  The patient is alert and oriented in no apparent distress.  Cooperative and coherent.  His speech is fluent.  NECK:  Normal carotid pulses.  LUNGS:  Clear to auscultation bilaterally.  CARDIOVASCULAR:  Regular rate and rhythm without murmur.  ABDOMEN:  Not distended.  Soft.  Good bowel sounds.  No organomegaly.  EXTREMITIES:  No edema.  NEUROLOGIC:  Speech, gait, and motor are intact.  The extraocular movements  are normal.  Pupils are equal and reactive to light and accommodation.   LABORATORY/X-RAY:  CT of the head without contrast is negative.   Chest x-ray showed no acute changes.   Hemoglobin 15.4, white cells 9.5, platelets 267.  BNP is basically normal.  LFTs are normal.  INR is 1.9.  Urinalysis is negative.  Drug screen is  negative.  One set of cardiac enzymes are negative.   EKG shows some  nonspecific changes, primarily at the inferior leads.  I will  attempt to get an old EKG to compare with.   ASSESSMENT/PLAN:  1.  The patient had an episode of dizziness and headache that is now      resolving.  Will admit him to telemetry, do neuro checks, and check an      MRI/MRA, and a carotid ultrasound.  Continue with aspirin and Coumadin.  2.  Antiphospholipid syndrome:  Continue with Coumadin.  3.  Hypertension:  Blood pressure is slightly high today.  Will monitor      while he is in the hospital.  4.  EKG shows some inferior abnormalities.  Will try to get an old EKG to      compare with a new EKG.      Wanda Plump, MD [facility]  Electronically Signed     JEP/MEDQ  D:  03/12/2005  T:  03/12/2005  Job:  [id number]   cc:   Corwin Levins, M.D. [facility]  [address]  [place]  [place] [address]

## 2010-08-02 NOTE — Consult Note (Signed)
NAME:  [name], [name]                 ACCOUNT NO.:  [id number]   MEDICAL RECORD NO.:  [id number]          PATIENT TYPE:  INP   LOCATION:  4703                         FACILITY:  [facility]   PHYSICIAN:  Casimiro Needle L. Reynolds, M.D.DATE OF BIRTH:  22-Mar-1950   DATE OF CONSULTATION:  03/12/2005  DATE OF DISCHARGE:                                   CONSULTATION   REQUESTING PHYSICIAN:  [facility].   PRIMARY CARE PHYSICIAN:  Corwin Levins, M.D.   REASON FOR EVALUATION:  Dizziness, possible TIA.   HISTORY OF THE PRESENT ILLNESS:  This is the initial inpatient consultation  evaluation of this 60 year old man whose past medical history is remarkable  for a retinal stroke involving the right eye in August 2006 for which the  patient reportedly underwent a fairly extensive workup, the primary results  of which are not available to me, but which are reported below by his wife.  He also has a history of hypercholesterolemia and previous MI by imaging  with nonobstructive coronary artery disease by cardiac catheterization.   The patient states that about 1:30 today he experienced the sudden onset of  symptoms similar to the symptoms he suffered when he had his retinal  occlusive event in August of this year.  He suddenly felt sort of unsteady  on his feet and noticed a nonspecific change in his vision, stating that  things were somewhat blurry and he had difficulty focusing, and determining  depth and distance.  He also had a mild frontal headache.  He states for  about five or 10 minutes he had some numbness in the right wrist, but beyond  that had no focal numbness or weakness.  He never had any slurred speech or  any other loss of vision.  He reported the symptoms to his wife who noted he  did not have slurred speech by phone.  He went to an urgent care and was  advised to come to the [facility] emergency room where a stroke was  called.  By the time I evaluated the patient he  states he feels pretty much  back to normal.  He states he has not had any of these symptoms like this  since he had his stroke event a few months ago.   On specific questioning he denies any chest pain, shortness of breath,  diarrhea, urinary symptoms, or skin rash.   PAST MEDICAL HISTORY:  The past medical history is remarkable for a retinal  stroke as above, which left him with partial visual loss in the  inferolateral quadrant of his right eye.  His wife states at the time he was  seen by Dr. Aura Camps, a neuro-ophthalmologist, who made this  diagnosis.  He had carotid artery imaging studies, which demonstrated 60%  left carotid lesion and an ulcerated plaque in the right carotid.  He had a  2D echo, the results of which are not known.  He was also at that time found  to have a strongly positive antiphospholipid antibody.  He has been  anticoagulated with Coumadin since  that time and also takes aspirin.   In addition to the above the patient reports he underwent excision of a  thyroglossal duct cyst a couple years ago.  At that time he was seen by a  cardiologist and was found to have an inferior scar.  He then had a  catheterization, which demonstrated nonobstructive coronary artery disease.  Since that time he has been on Toprol.  He has also been treated for  hypercholesterolemia with Crestor.  Beyond this he denies any chronic  medical problems.  Family history is negative for antiphospholipid antibody  syndrome specifically.   SOCIAL HISTORY:  The patient does smoke heavily, about 1.5 packs a day and  also consumes alcohol regularly.  He works as a Soil scientist.  He is  normally independent in his activities of daily living.   MEDICATIONS:  Baby aspirin, Coumadin, Toprol and Crestor.   REVIEW OF SYSTEMS:  Full ten system review of systems is negative, except as  noted in the HPI, the admission history and physical, and the nursing  records.   PHYSICAL EXAMINATION:   VITAL SIGNS:  Temperature 98.6, blood pressure  172/91, pulse 81, respirations 20, and O2 sat 98% on 2 liters of O2 nasal  cannula.  GENERAL APPEARANCE:  On general examination this is a 60 year old man in no  distress.  HEENT:  Cranium is normocephalic and atraumatic.  Oropharynx is benign.  NECK:  The neck is supple without carotid or supraclavicular bruits.  HEART:  The heart is regular without murmurs.  NEUROLOGIC EXAMINATION:  Mental status:  He is awake and alert.  He is fully  oriented to time, place and person.  Recent and remote memory are intact.  Attention and concentration normal and appropriate.  Speech is fluent and  not dysarthric.  He has no defect to confrontational naming and he can  repeat phrases.  Cranial nerves:  Funduscopic exam is benign.  Pupils are equal and briskly  react.  Extraocular movements are full without nystagmus.  Examination of  the visual fields is full, except for the mild loss of vision, oculi dexter,  as noted above.  Hearing is intact, especially to finger rub.  Facial  sensation is intact to pinprick.  Face, tongue and palate move normally and  symmetrically.  Shoulder shrug strength is normal.  Motor testing:  Normal bulk and tone.  No strength in all tested extremity  muscles.  Sensation is intact to light touch and pinprick in all extremities.  Coordination:  Rapid movements are performed well.  Finger-to-nose and heel-  to-shin are performed well.  Gait:  He arises from a chair easily and his stance is normal.  He is able  to tandem walk without much difficulty.  Reflexes:  Two plus and symmetric. Toes are downgoing bilaterally.   LABORATORY DATA:  Laboratory review:  CBC; white count 9.5,  hemoglobin  15.4, platelets 267,000 with normal differential.  CMET is pending.  Cardiac  markers are pending.  INR 1.9, minimally subtherapeutic and PTT is elevated at 41.  Urinalysis and urine drug screen  are pending.   IMPRESSION:  1.  Transient  gait disturbance, headache, some transient visual disturbance      suspicious for transient ischemic attack, although other possibilities      include a transient drop in blood pressure, hypoglycemia or other event      cannot be entirely excluded.  2.  History of recent retinal stroke in August of this year with resultant  mild visual loss, oculi dexter.  3.  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  4.  History of antiphospholipid antibody syndrome on Coumadin.   PLAN:  1.  Would recommend a brief admission for observation, MRI and repeat      carotid Doppler studies.  2.  Would recommend continuing aspirin and Coumadin with target INR between      2 and 3.  3.  Stroke service to follow.   Thank you for this consultation.      Michael L. Thad Ranger, M.D.  Electronically Signed    MLR/MEDQ  D:  03/12/2005  T:  03/13/2005  Job:  [id number]

## 2010-08-02 NOTE — H&P (Signed)
NAME:  [name], SKORA NO.:  [id number]   MEDICAL RECORD NO.:  [id number]           PATIENT TYPE:   LOCATION:                                 FACILITY:   PHYSICIAN:  Larina Earthly, M.D.    DATE OF BIRTH:  Feb 05, 1951   DATE OF ADMISSION:  DATE OF DISCHARGE:                                HISTORY & PHYSICAL   CHIEF COMPLAINT:  History of TIAs.   HISTORY OF PRESENT ILLNESS:  Mr. Petrovic is a pleasant 60 year old Caucasian  male who has had three separate episodes of neurologic deficits.  First one  occurred in August of 2006 where he states he had vision loss to his right  eye.  The patient states he still had residuals from this with a moon-shaped  area of blurriness on his right eye.  States no improvement nor worsening of  this.  Patient also had episodes of left arm numbness and weakness lasting  for about 15-20 minutes.  These occurred most recently in December 2006.  Patient had a work-up to include CT scan and MRI which reportedly were  negative.  He was placed on Coumadin therapy, initially was told that he had  hypercoagulable state.  Patient was then seen by Dr. Clelia Croft with  hematology/oncology group and was told that he does not have any  hypercoagulable state and was recommended to stop Coumadin therapy.  Patient  states that about one time per week he does experience left arm feeling  cold.  He denies any weakness or numbness.  States that this lasts several  minutes and then subsides.  Patient recently had bilateral carotid duplex  ultrasound studies done showing a 40-60% on the right and 60-80% on the  left.  Prior to this patient had duplex carotid ultrasound done at [facility]  in August 2006 showing 0-39% on the right and 40-59% on the left.  Patient  denies any nausea, vomiting, vertigo, dizziness, any residual muscle  weakness or numbness, any tingling, dysarthria, dysphagia, syncope,  presyncope, memory loss, or confusion.   PAST MEDICAL HISTORY:  1.  History of TIAs.  2.  Hypertension.  3.  Hyperlipidemia.  4.  History of Raynaud's.   PAST SURGICAL HISTORY:  Status post thyroglossal duct cyst removal.   ALLERGIES:  No known drug allergies.   MEDICATIONS:  1.  Toprol XL 25 mg daily.  2.  Aspirin 81 mg daily.  3.  Crestor 20 mg daily.  4.  Tums p.r.n.   SOCIAL HISTORY:  Patient is married with two children.  Lives at home with  his family.  Patient is adopted.  Patient has a history of tobacco use which  he currently still smokes one pack per day over the past 35 years.  Patient  also has history of alcohol use still drinking, states drinks about 30 beers  per week.  Patient does work here in [place].   FAMILY HISTORY:  As stated above, patient is adopted.  Patient states the  only family history he can find was his grandfather has history of  myocardial infarction.  REVIEW OF SYSTEMS:  See HPI for pertinent positives and negatives.  Otherwise, review of systems within normal limits.  Patient denies any  recent fevers, night sweats, chills, any recent headaches, changes in  hearing, difficulty swallowing.  He denies any chest pain, palpitations,  arrhythmias, orthopnea, paroxysmal nocturnal dyspnea.  He does state he  experiences shortness of breath after walking up an incline but states that  this has not changed in severity over the past several months or he has not  started experiencing any shortness of breath at rest.  He denies any  hemoptysis.  Patient also denies any nausea, vomiting, diarrhea,  constipation, melena, hematemesis, hematochezia.  He denies any urgency,  frequency, dysuria, hematuria.   PHYSICAL EXAMINATION:  GENERAL:  Well-developed, well-nourished, white male  in no acute distress.  VITAL SIGNS:  Blood pressure 158/80, pulse 72, respirations 18.  HEENT:  Normocephalic, atraumatic.  Pupils are equal, round, and reactive to  light and accommodation.  Extraocular movements intact.  Oral mucosa is  pink  and moist.  NECK:  Supple.  No carotid bruits noted.  RESPIRATORY:  Clear to auscultation bilaterally.  CARDIAC:  Regular rate and rhythm with S1 and S2 noted.  No murmurs, rubs,  or gallops noted.  ABDOMEN:  Bowel sounds x4.  Soft, nontender on palpation.  No  hepatosplenomegaly noted.  GENITALIA:  Deferred.  RECTAL:  Deferred.  EXTREMITIES:  No edema, cyanosis, or clubbing noted.  Bilateral hands are  cold to touch.  Bilateral lower extremities are warm to touch.  Patient has  2+ bilateral, radial, femoral, DP, and PT pulses noted.  NEUROLOGIC:  Cranial nerves II-XII intact.  Patient is alert and oriented  x4.  Gait steady.  Muscle strength 5/5 upper and lower extremities  bilaterally.   IMPRESSION AND PLAN:  Patient is seen with bilateral internal carotid artery  stenosis.  Patient is still left with residual of right vision change.  Patient seen and evaluated by Dr. Arbie Cookey.  Plan is to perform cerebral  angiogram to better view the carotids which is to be done by Dr. Arbie Cookey  May 07, 2005.  Based on the results of this plan for possible right  carotid endarterectomy February 22 by Dr. Arbie Cookey.  Risks and benefits have  been discussed with patient.  Patient acknowledges understanding and agrees  to proceed.  Patient is scheduled for short-stay today, February 19 at 2:30.      Theda Belfast, [place]      Larina Earthly, M.D.  Electronically Signed    KMD/MEDQ  D:  05/05/2005  T:  05/05/2005  Job:  [id number]

## 2010-08-02 NOTE — Discharge Summary (Signed)
NAME:  [name], WANT NO.:  [id number]   MEDICAL RECORD NO.:  [id number]          PATIENT TYPE:  INP   LOCATION:  4703                         FACILITY:  [facility]   PHYSICIAN:  Rene Paci, M.D. LHCDATE OF BIRTH:  08-01-1950   DATE OF ADMISSION:  03/12/2005  DATE OF DISCHARGE:  03/13/2005                                 DISCHARGE SUMMARY   DISCHARGE DIAGNOSES:  1.  Dizziness, questionable transient ischemic attack.  2.  Antiphospholipid syndrome.  3.  Bilateral internal carotid artery, left greater than right.  4.  Tobacco abuse.   HISTORY OF PRESENT ILLNESS:  The patient is a 60 year old male who is  admitted with complaints of dizziness and headache including being off  balance.  [facility] and [facility] to  the ER.   PAST MEDICAL HISTORY:  1.  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  2.  Status post cardiac catheterization April 2005 which revealed      nonobstructive coronary artery disease.  3.  Antiphospholipid syndrome diagnosed August 2006 after developing clot in      the right eye.  4.  Hypertension.  5.  High cholesterol.   HOSPITAL COURSE:  STATUS POST DIZZINESS LIKELY SECONDARY TO TRANSIENT ISCHEMIC ATTACK:  The  patient was admitted.  Symptoms resolved.  An MRI was performed during this  admission which showed no acute abnormality.  A neurology consult was  obtained during this admission as well, and it was thought the patient's  symptoms were likely secondary to TIA.  It was recommended the patient quit  smoking, continue Coumadin, and maintain strict blood pressure control for  systolic blood pressure less than 130.  In addition, it was recommended INR  be 2.5 to 3.5 secondary to antiphospholipid syndrome.  The patient did  undergo a carotid Doppler which showed bilateral ICA stenoses, left greater  than right.   DISCHARGE MEDICATIONS:  1.  Toprol XL 25 mg daily.  2.  Coumadin 5 mg p.o.  Saturday and Monday; Coumadin 2.5 mg p.o. Tuesday,      Wednesday, Friday, and Sunday.  3.  Crestor 20 mg daily.  4.  Aspirin 81 mg daily.  5.  Tums as needed.   LABORATORY DATA AT DISCHARGE:  Hemoglobin 15.4, hematocrit 44.3, BUN 11,  creatinine 1. INR 1.9.   FOLLOW UP:  The patient was instructed to follow up with Dr. Oliver Barre in 1  to 2 weeks as well as to follow up in the Coumadin clinic in one week.  He  was instructed to follow up with Dr. Jonny Ruiz or go to the emergency department  should he feel weakness, numbness, or dizziness.      Melissa S. Peggyann Juba, NP      Rene Paci, M.D. [facility]  Electronically Signed    MSO/MEDQ  D:  07/08/2005  T:  07/09/2005  Job:  [id number]

## 2010-08-02 NOTE — Assessment & Plan Note (Signed)
[facility]                              CARDIOLOGY OFFICE NOTE   NAME:[name], [name]                          MRN:          [id number]  DATE:12/24/2005                            DOB:          [birth year]    REASON FOR VISIT:  Mr. Delcastillo is seen for cardiology followup.  He has mild  coronary disease that has been documented in the past.  He has carotid  disease.  Ultimately, he was seen by Dr. Gretta Began.  It is my understanding  that he had an angiogram of his carotid and further workup was not needed.  I do not have the specifics of the angiogram.  He had a retinal artery  occlusion.  He has mild vision loss from this and he is use to this.  The  patient also has a history of Raynaud's disease.  He has thyroid cysts.   There was question of antiphospholipid antibody.  My understanding is that  there has been a workup done through hematology.  The patient tells me that  he does not have antiphospholipid antibody problems and he was taken off  Coumadin.  He is on Aggrenox through Dr. Thad Ranger of neurology.   The patient is not having any significant chest pain.  He has no significant  shortness of breath at this time.  He is going about full activities.  A  cardiac catheterization in April 2005, had shown some luminal irregularities  of the LAD.  There is also some disease of the right coronary artery which  is small.  The inferior wall was perfused by multiple small branching  vessels without a obvious significant posterior descending.   ALLERGIES:  No known drug allergies.   MEDICATIONS:  1. Toprol XL 25.  2. Aggrenox 25 mg daily.   PAST MEDICAL HISTORY:  See the list below.   REVIEW OF SYSTEMS:  Today, he feels well.  He has no significant complaints  and his review of systems is negative.   PHYSICAL EXAMINATION:  GENERAL:  The patient is well-nourished.  He appears  to be tan and possibly have some areas of skin burn.  He is oriented to  person, time and place and his affect is normal.  HEENT:  No xanthelasma.  He has normal extraocular motion.  There is a right  carotid bruit.  There is no jugular venous distention.  LUNGS:  Clear.  Respiratory effort is not labored.  CARDIAC:  S1, S2.  There are no clicks or significant murmurs.  ABDOMEN:  Soft.  There are no masses or bruits.  He has no peripheral edema.  Distal pulses are 1+ bilaterally.   No labs are done today.   IMPRESSION:  1. Abnormal Myoview scan in the past with some inferior hypokinesis.      Cardiac catheterization showing normal left ventricular function with      findings as outlined above.  I will need the actual catheterization      report to be more exact.  2. Ongoing tobacco use.  The patient is still smoking and,  of course, is      encouraged to stop.  3. Abnormal carotid Dopplers.  I have now found the angiogram report.  It      was done by Dr. Arbie Cookey on May 07, 2005.  The right common carotid      artery was assessed.  There was no stenosis.  The left common carotid      showed normal external flow.  There outpouching and posterior plaque      with no source of emboli.  It is my understanding that the patient can      be followed.  I do not know the specifics of when he needs to see Dr.      Arbie Cookey in followup.  4. History of thyroid cyst.  5. History of Raynaud's phenomenon.  6. History of hypertension.  7. History of some regular alcohol use in the past.  8. Question of anti-phospholipid antibody in the past, but the patient      tells me that this is not thought to be the case.  9. History of gastroesophageal reflux disease.  10.Diagnosis by Dr. Thad Ranger of possible small vessel transient ischemic      attack in the past.  11.Status post transcervical excision of the thyroglossal duct cyst done      in 2005.   PLAN:  The patient's coronary status is stable.  No further testing is  needed.  I will leave the treatment of his cholesterol  up to Dr. Jonny Ruiz.  I  will also leave the follow up of the patient's carotids to Dr. Jonny Ruiz and Dr.  Gretta Began.  I can see the patient back for cardiology followup in 1 year.            ______________________________  Luis Abed, MD, [facility]     JDK/MedQ  DD:  12/24/2005  DT:  12/26/2005  Job #:  [id number]   cc:   Corwin Levins, MD  Larina Earthly, M.D.  Michael L. Thad Ranger, M.D.

## 2010-08-02 NOTE — Op Note (Signed)
NAME:  [name], [name]                 ACCOUNT NO.:  [id number]   MEDICAL RECORD NO.:  [id number]          PATIENT TYPE:  AMB   LOCATION:  SDS                          FACILITY:  [facility]   PHYSICIAN:  Larina Earthly, M.D.    DATE OF BIRTH:  [birth year]   DATE OF PROCEDURE:  05/07/2005  DATE OF DISCHARGE:  05/07/2005                                 OPERATIVE REPORT   PREOPERATIVE DIAGNOSIS:  Extracranial cerebrovascular occlusive disease.   POSTOPERATIVE DIAGNOSIS:  Extracranial cerebrovascular occlusive disease.   PROCEDURE:  Arch four vessel cerebral arteriogram.   SURGEON:  Larina Earthly, M.D.   ANESTHESIA:  1% lidocaine local.   COMPLICATIONS:  None.   DISPOSITION:  To the holding area stable.   INDICATIONS FOR PROCEDURE:  The patient is a 60 year old gentleman with a  recent admission in December with neurologic alteration initially felt to be  possibly related to vertebrobasilar insufficiency.  He did have moderate  carotid disease by Doppler.  After discharge, he had an episode of left arm  clumsiness.  He is taken to the angio suite at this time to rule out  ulcerative plaque as a source for emboli to his cerebral circulation.   PROCEDURE IN DETAIL:  The patient taken peripheral vascular cath lab and  placed in the supine position where the area of the right groin was  anesthetized with 4% lidocaine local.  Under sterile conditions, a guidewire  was positioned using Seldinger technique up to the level of aortic arch.  A  pigtail catheter was positioned at the arch and a 35 degrees LAO projection  was undertaken.  This revealed widely patent arch vessels with no evidence  of stenoses.  The patient had widely patent vertebral arteries bilaterally  on the arch injection.  Next, a Simmons catheter was used to selectively  catheterize the innominate and then the subclavian artery.  AP and lateral  cervical and intracranial views were undertaken of the right vertebral  artery.   Intracranial views will be dictated as separate note by [facility]  Radiology.  This showed no evidence of vertebral artery stenosis.  Next, the  right common carotid artery was catheterized, again using a Simmons catheter  and again AP and lateral, cervical and intracranial views were undertaken.  This revealed no irregularity in the carotid artery and no stenoses.  The  Simmons catheter was then withdrawn and the left subclavian artery was  catheterized.  Again, vertebral injections were undertaken again showing no  evidence of stenoses.  Finally, the left common carotid artery was  selectively catheterized and showed  normal external carotid flow.  There was some outpouching with posterior  plaque with no regular source for emboli seen in the carotid bulb.  The  patient tolerated the procedure without complications and was transferred to  the holding area stable condition where the sheath was pulled.      Larina Earthly, M.D.  Electronically Signed     TFE/MEDQ  D:  05/09/2005  T:  05/09/2005  Job:  [id number]   cc:   Corwin Levins,  M.D. [facility]  [address]  [place]  [place] [address]   Marolyn Hammock. Thad Ranger, M.D.  Fax: [phone]

## 2010-08-02 NOTE — Discharge Summary (Signed)
NAME:  [name], [name]                           ACCOUNT NO.:  [id number]   MEDICAL RECORD NO.:  [id number]                   PATIENT TYPE:  OIB   LOCATION:  5705                                 FACILITY:  [facility]   PHYSICIAN:  Kinnie Scales. Annalee Genta, M.D.            DATE OF BIRTH:  May 07, 1950   DATE OF ADMISSION:  06/15/2003  DATE OF DISCHARGE:  06/16/2003                                 DISCHARGE SUMMARY   ADMISSION DIAGNOSES:  1. Anterior neck mass.  2. Thyroglossal duct cyst.   FINAL DIAGNOSES:  1. Anterior neck mass.  2. Thyroglossal duct cyst.   PROCEDURE:  Transcervical excision of thyroglossal duct cyst.  The patient  is discharged to home in stable condition in the company of his wife.   DISCHARGE MEDICATIONS:  Preoperative meds: Toprol XL 25 mg p.o. daily and  postoperative meds: Augmentin 500 mg p.o. b.i.d. for 10 days and Percocet  5/325  1-2 tablets every 4-6 hours as needed (dispensed 30 without refills).  Pain management as above.   ACTIVITY:  Limited, no lifting or straining.   DIET:  Liquid and soft diet as tolerated.   WOUND CARE:  0.5% hydrogen peroxide and bacitracin ointment on a twice daily  basis.  The patient will follow up in my office on 06/22/2003 or sooner as  warranted.   HISTORY:  Mr. Sako is a 60 year old white male who is referred for  evaluation of a gradually enlarging anterior neck mass.  The patient had a  longstanding history of a small mass and after several episodes of upper  respiratory infection, developed an acute swelling, significant discomfort  and findings consistent with infected anterior neck cyst.  He was treated  with several courses of antibiotics and CT scanning was obtained which  showed a cystic mass in the midline of the neck adjacent to the laryngeal  cartilage.  Given the patient's history and examination, I recommended  undertaking excisional biopsy of the mass under general anesthesia.  Prior  to admission to the hospital,  cardiac evaluation was undertaken by Dr.  Willa Rough and the patient was cleared for surgical intervention.  Prior  to admission, the risks, benefits and possible complications of the above  surgical procedure and hospitalization were discussed and the patient's wife  understood and agreed with our plan for admission to [facility].   HOSPITAL COURSE:  The patient was admitted to the ENT Service under Dr.  Thurmon Fair care on June 15, 2003. He was taken to the main operating room  at [facility], under general anesthesia, and underwent a  transcervical excision of an anterior neck mass, consistent with  thyroglossal duct cyst via a Sistrunk procedure performed on June 15, 2003.  The patient's surgery was uncomplicated.  He was transferred from the  operating room to recovery and then from recovery to unit 5700 for  postoperative care.   The patient's  postoperative course was uneventful and straightforward.  He  was tolerating liquids and soft oral diet by the first postoperative  evening.  Pain control was good with oral Percocet tablets.  No significant  swelling or discharge and on the first postoperative morning, the patient's  incision was dry and intact without erythema or swelling.  Jackson-Pratt  drain output had diminished to 15 mL per shift and was removed without  difficulty.  The patient was discharged to home in stable condition with the  above discharge instructions and wound care.  He and his wife were  comfortable with our discharge planning and will follow up with me next week  as scheduled.                                                Kinnie Scales. Annalee Genta, M.D.    DLS/MEDQ  D:  04/54/0981  T:  06/17/2003  Job:  [id number]   cc:   Brett Canales A. Cleta Alberts, M.D.  [address]  [place]  [place] [address]  Fax: [phone]   Willa Rough, M.D.

## 2010-08-02 NOTE — Cardiovascular Report (Signed)
NAME:  [name], [name] NO.:  [id number]   MEDICAL RECORD NO.:  [id number]                   PATIENT TYPE:  OIB   LOCATION:  6501                                 FACILITY:  [facility]   PHYSICIAN:  Vida Roller, M.D.                DATE OF BIRTH:  [birth year]   DATE OF PROCEDURE:  06/27/2003  DATE OF DISCHARGE:  06/27/2003                              CARDIAC CATHETERIZATION   PRIMARY CARE PHYSICIAN:  Brett Canales A. Cleta Alberts, M.D.   HISTORY OF PRESENT ILLNESS:  Mr. Schaum is a 60 year old man who was referred  to [place] for preoperative consultation.  My colleague, Dr. Jerral Bonito, saw him  in February of this year.  He underwent a perfusion imaging study which  showed inferior scar consistent with an old myocardial infarction and normal  left ventricular systolic function.  He was referred for his thyroid surgery  which he successfully had earlier this month.  He now returns for cardiac  catheterization elective procedure.   DESCRIPTION OF PROCEDURE:  After obtaining informed consent, the patient was  brought to the cardiac catheterization laboratory in a fasting state where  he was prepped and draped in the usual sterile manner.  Local anesthetic was  obtained over the right groin using 1% lidocaine without epinephrine.  The  right femoral artery was cannulated using the modified Seldinger technique  with a 4 French 10 cm sheath.  Left heart catheterization was performed  using a 4 [place] system.  Left ventriculography was performed in the 30  degree RAO view.  At the conclusion of the procedure, the catheters were  removed and the patient was brought back to the cardiology holding area  where the femoral artery sheath was removed.  Hemostasis was obtained using  direct manual pressure.  At the conclusion of the hold, there was no  evidence of ecchymosis or hematoma formation and distal pulses were intact.  Total fluoroscopic time was 7.45 minutes.  Total contrast was 120  mL.   RESULTS:  Aortic pressure 125/64 with a mean arterial pressure of 91 mmHg.  Left ventricular  pressure was 127/6 with an end diastolic pressure of 16  mmHg.   CORONARY ANGIOGRAPHY:  The left main coronary artery is a normal size artery which is  angiographically normal.   The left anterior descending coronary artery is a moderate caliber vessel  with a single branching diagonal and has luminal irregularities but no  obstructive disease.   The left circumflex is a large artery which appears to be codominant.  It  has three significant obtuse marginals and a large posterolateral branch.  There is a small codominant posterior descending coronary artery which is  not well seen.   The right coronary artery is a small codominant vessel which also has a  small posterior descending coronary artery.  The inferior wall appears to be  perfused by multiple small branching  vessels without an obvious significant  posterior descending coronary.  There appears to be no significant  obstruction in the right coronary artery.   Left ventriculography reveals normal size left ventricle with 60% ejection  fraction.  No obvious wall motion abnormalities and no mitral regurgitation  seen.   ASSESSMENT:  This is a gentleman with nonobstructive coronary disease and  normal left ventricular systolic function.   RECOMMENDATIONS:  Aggressive risk factor management with medications.                                               Vida Roller, M.D.    JH/MEDQ  D:  06/27/2003  T:  06/27/2003  Job:  [id number]

## 2010-08-04 ENCOUNTER — Telehealth: Payer: Self-pay | Admitting: Physician Assistant

## 2010-08-04 NOTE — Telephone Encounter (Signed)
Monitor service notified me patient in AFib with HR's 90-130s. I called patient.  He had some postural dizziness today.  No dyspnea or chest pain.  No syncope or near syncope. His wife is a Engineer, civil (consulting).  She gave him some metoprolol about 20 mins ago.   She will keep an eye on him over the next 1-2 hours.  If dizziness not resolved or his HR still high, he will come to the ED. Otherwise, will try to arrange earlier follow up in the office.  Heather,  Please see about bringing the patient in for earlier follow up with Dr. Myrtis Ser.

## 2010-08-05 NOTE — Telephone Encounter (Signed)
Spoke w/pt he states episode yest lasted about 1- 1 & 1/2 hours and got better w/a dose of metoprolol, has been fine since then, appt with Dr Myrtis Ser was sch for 6/28, rescheduled to [facility] 5/24 at 4pm

## 2010-08-08 ENCOUNTER — Ambulatory Visit (INDEPENDENT_AMBULATORY_CARE_PROVIDER_SITE_OTHER): Payer: 59 | Admitting: Cardiology

## 2010-08-08 ENCOUNTER — Encounter: Payer: Self-pay | Admitting: Cardiology

## 2010-08-08 VITALS — BP 119/65 | HR 64 | Resp 18 | Ht 74.0 in | Wt 174.0 lb

## 2010-08-08 DIAGNOSIS — I498 Other specified cardiac arrhythmias: Secondary | ICD-10-CM

## 2010-08-08 DIAGNOSIS — R002 Palpitations: Secondary | ICD-10-CM | POA: Insufficient documentation

## 2010-08-08 DIAGNOSIS — I4891 Unspecified atrial fibrillation: Secondary | ICD-10-CM | POA: Insufficient documentation

## 2010-08-08 DIAGNOSIS — I471 Supraventricular tachycardia: Secondary | ICD-10-CM

## 2010-08-08 NOTE — Patient Instructions (Signed)
You have been referred to EP Your physician recommends that you schedule a follow-up appointment in: 3 months with Dr Myrtis Ser

## 2010-08-08 NOTE — Assessment & Plan Note (Signed)
The patient has symptomatic ventricular tachycardia.  His CHADS  Score is 2.  The question of anticoagulation must be answered.  First we must decide if he has atrial fib or not.  I am not convinced from the monitor strips.  I had a long discussion with the patient and his wife.  I explained that I thought the rhythm was not dangerous.  He has enough symptoms that we would want to treat further.  He has coronary disease and a medication such as flecainide cannot be used.  This would require one of the more potent antiarrhythmics.  In addition I think that there is a chance that he has a rhythm that could be considered for ablation.  I will need help from electrophysiology to make this decision.  I've chosen not to change his medicines.  He very much favors looking into the possibility of ablation versus other medications.  I explained to him that if the rhythm is atrial fibrillation,  a medication would have to be tried before ablation could be considered. An appointment will be scheduled with electrophysiology team.

## 2010-08-08 NOTE — Progress Notes (Signed)
HPI Patient is seen today for followup palpitations, coronary artery disease, supraventricular tachycardia.  The patient was recently evaluated for his coronary artery disease.  I saw him last in the office on Jul 24, 2010.  That visit was after his cardiac catheterization.  He had inferobasilar hypokinesis and decision was made to proceed with catheter.  He has total right coronary with good collaterals.  He is treated medically.  However he had a burst of supraventricular tachycardia during the catheterization.  He was put on Cardizem.  He was given some Toprol to use as a backup if he had symptoms.  An event recorder was placed.  The patient called to let us know that he had had symptoms for approximately one hour.  The event recorder did document this arrhythmia on Aug 04, 2010 around noon.  He has definite supraventricular tachycardia.  I am not completely sure what the rhythm is.  The computer reads it as atrial fibrillation.  It certainly may be atrial fib.  However at times there is suggestion of flutter waves.  Also when there are and beat salvos the rhythm appears to be quite regular.  Therefore I wonder if it is possible that this is a reentrant tachycardia.  Patient and his wife are here today for further discussion. No Known Allergies  Current Outpatient Prescriptions  Medication Sig Dispense Refill  . atorvastatin (LIPITOR) 20 MG tablet Take 20 mg by mouth daily.        . calcium carbonate (TUMS - DOSED IN MG ELEMENTAL CALCIUM) 500 MG chewable tablet Chew 1 tablet by mouth as needed.        Marland Kitchen CARTIA XT 120 MG 24 hr capsule 1 TAB QD       . dipyridamole-aspirin (AGGRENOX) 25-200 MG per 12 hr capsule Take 1 capsule by mouth 2 (two) times daily.       . metoprolol succinate (TOPROL-XL) 25 MG 24 hr tablet Take 25 mg by mouth daily as needed.        . sildenafil (VIAGRA) 100 MG tablet Take 100 mg by mouth daily as needed.        . Tamsulosin HCl (FLOMAX) 0.4 MG CAPS 0.4 mg. 1 tab qd         . DISCONTD: rosuvastatin (CRESTOR) 20 MG tablet Take 20 mg by mouth daily.          History   Social History  . Marital Status: Married    Spouse Name: N/A    Number of Children: N/A  . 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 Smoking status: Current Everyday Smoker    Types: Cigarettes  . Smokeless tobacco: Not on file   Comment: 1 pack a day  . Alcohol Use: Not on file  . Drug Use: Yes  . Sexually Active: Not on file   Other Topics Concern  . Not on file   Social History Narrative  . No narrative on file    Family History  Problem Relation Age of Onset  . Hypertension      Past Medical History  Diagnosis Date  . Adenomatous colon polyp   . Palpitations     probable SVT, rate 170,, at home,, lasted one hour,, 2012  . Hypercholesterolemia   . GERD (gastroesophageal reflux disease)   . Alcohol use   . Hypertension   . Raynaud's syndrome   . TIA (transient ischemic attack)  Possible small vessel tia in the past.  . CRAO (central retinal artery occlusion)   . BPH (benign prostatic hypertrophy)   . Carotid artery disease     0-39% R. ICA, 40-59% LICA... Doppler.... January, 2010  /  Doppler... in January, 2011 no change  /  doppler1/27/2012...stable  0-39% RICA 40-59% LICA  . Tobacco abuse   . Antiphospholipid antibody positive     ???In the past???but not proven according to patient  . Thyroid cyst     Removed in the past  . Ejection fraction      EF 50-55%, echo, March, 2012... inferobasal and distal septal hypokinesis /      60%, echo, November, 2008  . CAD (coronary artery disease)     Catheterization 2005 disease / catheterization July 06, 2010, chronic total occlusion distal RCA with collaterals from right and left side.  Medical therapy recommended, mild decreased LV function       . Antiphospholipid antibody positive     ??? in the past, but not proven according to the patient.  . SVT  (supraventricular tachycardia)     Event recorder may the 20th, 2012, not sure if atrial fibrillation, atrial flutter, or reentrant supraventricular tachycardia    Past Surgical History  Procedure Date  . Cardiac catheterization 06/27/2003    2005.... mild irregularity of the LAD..( patient had had abnormal Myoview scan)  . Inguinal hernia repair     right    ROS  Patient denies fever, chills, headache, sweats, rash, change in vision, change in hearing, chest pain, cough, nausea vomiting, urinary symptoms.  All other systems are reviewed and are negative.  PHYSICAL EXAM Patient is stable today.  He is here with his wife who has medical background.  He is oriented to person time and place.  Affect is normal.  Head is atraumatic.  He has reddening of his skin.  It has been my impression that this is a Raynaud's type phenomenon for him.  There is no jugulovenous distention.  Lungs are clear.  Respiratory effort is nonlabored.  Cardiac exam reveals an S1 and S2.  No clicks or significant murmurs.  The abdomen is soft.  There is no peripheral edema.  There are no musculoskeletal deformities. Filed Vitals:   08/08/10 1603  BP: 119/65  Pulse: 64  Resp: 18  Height: 6\' 2"  (1.88 m)  Weight: 174 lb (78.926 kg)    I have outlined the strips available from Aug 04, 2010.  See the description above in the history of present illness.  ASSESSMENT & PLAN

## 2010-08-31 ENCOUNTER — Inpatient Hospital Stay (HOSPITAL_COMMUNITY)
Admission: EM | Admit: 2010-08-31 | Discharge: 2010-09-02 | DRG: 309 | Disposition: A | Discharge status: Home / Self Care | Payer: 59 | Attending: Cardiology | Admitting: Cardiology

## 2010-08-31 ENCOUNTER — Emergency Department (HOSPITAL_COMMUNITY): Payer: 59

## 2010-08-31 DIAGNOSIS — I10 Essential (primary) hypertension: Secondary | ICD-10-CM | POA: Diagnosis present

## 2010-08-31 DIAGNOSIS — I6529 Occlusion and stenosis of unspecified carotid artery: Secondary | ICD-10-CM | POA: Diagnosis present

## 2010-08-31 DIAGNOSIS — I7300 Raynaud's syndrome without gangrene: Secondary | ICD-10-CM | POA: Diagnosis present

## 2010-08-31 DIAGNOSIS — F17200 Nicotine dependence, unspecified, uncomplicated: Secondary | ICD-10-CM | POA: Diagnosis present

## 2010-08-31 DIAGNOSIS — J4489 Other specified chronic obstructive pulmonary disease: Secondary | ICD-10-CM | POA: Diagnosis present

## 2010-08-31 DIAGNOSIS — R0789 Other chest pain: Secondary | ICD-10-CM

## 2010-08-31 DIAGNOSIS — I4892 Unspecified atrial flutter: Principal | ICD-10-CM | POA: Diagnosis present

## 2010-08-31 DIAGNOSIS — H349 Unspecified retinal vascular occlusion: Secondary | ICD-10-CM | POA: Diagnosis present

## 2010-08-31 DIAGNOSIS — R002 Palpitations: Secondary | ICD-10-CM

## 2010-08-31 DIAGNOSIS — I2510 Atherosclerotic heart disease of native coronary artery without angina pectoris: Secondary | ICD-10-CM | POA: Diagnosis present

## 2010-08-31 DIAGNOSIS — K219 Gastro-esophageal reflux disease without esophagitis: Secondary | ICD-10-CM | POA: Diagnosis present

## 2010-08-31 DIAGNOSIS — J449 Chronic obstructive pulmonary disease, unspecified: Secondary | ICD-10-CM | POA: Diagnosis present

## 2010-08-31 DIAGNOSIS — I2582 Chronic total occlusion of coronary artery: Secondary | ICD-10-CM | POA: Diagnosis present

## 2010-08-31 DIAGNOSIS — F1010 Alcohol abuse, uncomplicated: Secondary | ICD-10-CM | POA: Diagnosis present

## 2010-08-31 DIAGNOSIS — N400 Enlarged prostate without lower urinary tract symptoms: Secondary | ICD-10-CM | POA: Diagnosis present

## 2010-08-31 DIAGNOSIS — Z7901 Long term (current) use of anticoagulants: Secondary | ICD-10-CM

## 2010-08-31 DIAGNOSIS — Z8673 Personal history of transient ischemic attack (TIA), and cerebral infarction without residual deficits: Secondary | ICD-10-CM

## 2010-08-31 LAB — BASIC METABOLIC PANEL
BUN: 10 mg/dL (ref 6–23)
CO2: 25 mEq/L (ref 19–32)
Chloride: 103 mEq/L (ref 96–112)
Creatinine, Ser: 0.99 mg/dL (ref 0.50–1.35)
Potassium: 4.1 mEq/L (ref 3.5–5.1)

## 2010-08-31 LAB — CBC
HCT: 43.1 % (ref 39.0–52.0)
Hemoglobin: 15.9 g/dL (ref 13.0–17.0)
MCHC: 36.9 g/dL — ABNORMAL HIGH (ref 30.0–36.0)
MCV: 92.3 fL (ref 78.0–100.0)
RDW: 13 % (ref 11.5–15.5)

## 2010-08-31 LAB — POCT I-STAT, CHEM 8
BUN: 9 mg/dL (ref 6–23)
Chloride: 104 mEq/L (ref 96–112)
Creatinine, Ser: 1.2 mg/dL (ref 0.50–1.35)
Glucose, Bld: 77 mg/dL (ref 70–99)
Potassium: 4.1 mEq/L (ref 3.5–5.1)

## 2010-08-31 LAB — HEPATIC FUNCTION PANEL
ALT: 17 U/L (ref 0–53)
AST: 18 U/L (ref 0–37)
Albumin: 4 g/dL (ref 3.5–5.2)
Alkaline Phosphatase: 75 U/L (ref 39–117)
Total Protein: 6.8 g/dL (ref 6.0–8.3)

## 2010-08-31 LAB — DIFFERENTIAL
Eosinophils Relative: 6 % — ABNORMAL HIGH (ref 0–5)
Lymphocytes Relative: 39 % (ref 12–46)
Lymphs Abs: 3.1 10*3/uL (ref 0.7–4.0)
Monocytes Absolute: 0.6 10*3/uL (ref 0.1–1.0)
Monocytes Relative: 8 % (ref 3–12)

## 2010-08-31 LAB — TROPONIN I: Troponin I: <0.3 ng/mL (ref <0.30)

## 2010-08-31 LAB — LIPASE, BLOOD: Lipase: 22 U/L (ref 11–59)

## 2010-09-01 DIAGNOSIS — R079 Chest pain, unspecified: Secondary | ICD-10-CM

## 2010-09-01 LAB — PROTIME-INR: Prothrombin Time: 13.5 seconds (ref 11.6–15.2)

## 2010-09-01 LAB — CBC
HCT: 41.7 % (ref 39.0–52.0)
Hemoglobin: 14.9 g/dL (ref 13.0–17.0)
MCH: 33 pg (ref 26.0–34.0)
MCHC: 35.7 g/dL (ref 30.0–36.0)
RBC: 4.51 MIL/uL (ref 4.22–5.81)

## 2010-09-01 LAB — HEPARIN LEVEL (UNFRACTIONATED): Heparin Unfractionated: 0.35 IU/mL (ref 0.30–0.70)

## 2010-09-01 LAB — CARDIAC PANEL(CRET KIN+CKTOT+MB+TROPI)
Relative Index: 3.4 — ABNORMAL HIGH (ref 0.0–2.5)
Total CK: 151 U/L (ref 7–232)

## 2010-09-01 LAB — APTT: aPTT: 85 seconds — ABNORMAL HIGH (ref 24–37)

## 2010-09-01 LAB — TSH: TSH: 3.081 u[IU]/mL (ref 0.350–4.500)

## 2010-09-02 DIAGNOSIS — I4892 Unspecified atrial flutter: Secondary | ICD-10-CM

## 2010-09-02 LAB — CBC
Hemoglobin: 15.2 g/dL (ref 13.0–17.0)
MCH: 33.5 pg (ref 26.0–34.0)
MCHC: 35.9 g/dL (ref 30.0–36.0)
MCV: 93.2 fL (ref 78.0–100.0)
RBC: 4.54 MIL/uL (ref 4.22–5.81)

## 2010-09-02 LAB — HEPARIN LEVEL (UNFRACTIONATED): Heparin Unfractionated: 0.44 IU/mL (ref 0.30–0.70)

## 2010-09-04 NOTE — H&P (Signed)
NAME:  [name], MAKARA NO.:  [id number]  MEDICAL RECORD NO.:  [id number]  LOCATION:  2003                         FACILITY:  [facility]  PHYSICIAN:  Rollene Rotunda, MD, FACCDATE OF BIRTH:  1951-02-03  DATE OF ADMISSION:  08/31/2010 DATE OF DISCHARGE:                             HISTORY & PHYSICAL   PRIMARY CARE PHYSICIAN:  Dr. Darrell Jewel  CARDIOLOGIST:  Luis Abed, MD, [facility]  REASON FOR PRESENTATION:  Evaluate the patient with palpitations and chest comfort.  HISTORY OF PRESENT ILLNESS:  The patient is a 60 year old gentleman history of coronary artery disease as described below.  He also had some arrhythmias recently.  He says this has been going on for about 3-4 weeks.  He has had an episode every 1-2 weeks.  He is currently wearing an event recorder.  Dr. Myrtis Ser reviewed this recently in the office and noted him to have fibrillation versus flutter.  He was referring him for an EP evaluation.  He has been managed with Cardizem and p.r.n. beta- blockers.  Tonight around 7:30, he developed tachy palpitations.  This was similar to previous except that he had chest discomfort.  This is a mid sternal discomfort.  There was no radiation to his jaw or to his arms.  It is similar to previous angina.  It was moderate in intensity. There was no associated nausea, vomiting, or diaphoresis.  He had no new shortness of breath, PND, or orthopnea.  He presented to the emergency room where he was noted to be in atrial flutter with variable conduction.  He was placed on IV Cardizem and actually a couple of minutes before my evaluation converted spontaneously to sinus rhythm. He is pain free.  He did drink about 8-9 beers this evening.  He had no presyncope or syncope.  He otherwise has been feeling well.  PAST MEDICAL HISTORY: 1. Supraventricular tachycardia. 2. Coronary artery disease (chronically occluded right coronary artery     on cath in April 2012.  EF 50-55%). 3.  Ongoing tobacco abuse. 4. Ongoing alcohol use. 5. Gastroesophageal reflux disease. 6. Previous TIA. 7. Hypertension. 8. Retinal artery occlusion. 9. Benign prostatic hypertrophy. 10.Raynaud's. 11.Carotid stenosis (nonobstructive).  PAST SURGICAL HISTORY:  Right inguinal hernia repair, thyroglossal duct cyst resected.  FAMILY HISTORY:  The patient is adopted.  SOCIAL HISTORY:  The patient is married.  He is a Soil scientist.  He has been smoking 1 pack of cigarettes per day for 40 years.  He does drink beer.  ALLERGIES:  None.  MEDICATIONS: 1. Lipitor 20 mg daily. 2. Cartia 120 mg daily. 3. Metoprolol 25 mg p.r.n. 4. Flomax. 5. Aggrenox.  REVIEW OF SYSTEMS:  As stated in the HPI and negative for all other systems.  PHYSICAL EXAMINATION:  GENERAL:  The patient is pleasant and in no distress. VITAL SIGNS:  Blood pressure 138/58, heart rate 138 with flutter, respiratory rate 16, afebrile. HEENT:  Eyelids are unremarkable.  Pupils are equal, round, and reactive to light.  Fundi not visualized.  Oral mucosa unremarkable. NECK:  No jugular venous distention at 45 degrees.  Carotid upstroke brisk and symmetric.  Right carotid bruit.  No thyromegaly. LYMPHATICS:  No cervical, axillary, or inguinal adenopathy. LUNGS:  Clear to auscultation bilaterally. BACK:  No costovertebral angle tenderness. CHEST:  Unremarkable. HEART:  PMI not displaced or sustained.  S1 and S2 within normal limits. No S3, no S4, no clicks, no rubs, no murmurs. ABDOMEN:  Flat, positive bowel sounds normal in frequency and pitch.  No bruits, no rebound, no guarding, no midline pulsatile mass, no hepatomegaly, no splenomegaly. SKIN:  No rashes, no nodules. EXTREMITIES:  2+ pulses throughout.  No edema, no cyanosis, no clubbing. NEURO:  Oriented to person, place, and time.  Cranial nerves II-XII grossly intact.  Motor grossly intact throughout.  EKG:  Atrial flutter with variable conduction, left  ventricular hypertrophy by voltage criteria, axis within normal limits, intervals within normal limits, poor anterior R-wave progression.  LABORATORY DATA:  Lipase 22, sodium 140, potassium 4.1, BUN 10, creatinine 0.99, EtOH 105, troponin 0.3, CK 248, MB 7.1, WBC 8.1, hemoglobin 15.9, platelets 245.  Chest x-ray:  COPD.  ASSESSMENT/PLAN: 1. Atrial flutter.  The patient will be admitted.  I will put him on     IV heparin.  I am going to use his p.o. Cardizem now that he is     back in sinus.  We should review the previous trips to make sure     this was flutter previously.  This may be an ablatable rhythm.  He     does have an elevated CHADS score and does need anticoagulation.     For now, I will start Coumadin.  Pradaxa or rivaroxaban might be a     reasonable alternative if his insurance will cover this.     Ultimately, it would be nice that this is an ablatable rhythm so     that we could get him off anticoagulation.  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  I did discuss with     him the need to stop drinking as a trigger for arrhythmias. 2. Alcohol, as above. 3. Coronary artery disease.  Rule out myocardial infarction given his     chest discomfort.  I am going to start aspirin     325 today.  He will discontinue the Aggrenox. 4. Tobacco.  I counseled him about the need to stop smoking.  I think     he will be a good candidate for Chantix and he wants to consider     this.     Rollene Rotunda, MD, [facility]     JH/MEDQ  D:  08/31/2010  T:  09/01/2010  Job:  [id number]  cc:   [facility]  Electronically Signed by Rollene Rotunda MD [facility] on 09/04/2010 11:47:47 AM

## 2010-09-05 ENCOUNTER — Ambulatory Visit (INDEPENDENT_AMBULATORY_CARE_PROVIDER_SITE_OTHER): Payer: 59 | Admitting: *Deleted

## 2010-09-05 DIAGNOSIS — Z7901 Long term (current) use of anticoagulants: Secondary | ICD-10-CM | POA: Insufficient documentation

## 2010-09-05 DIAGNOSIS — G459 Transient cerebral ischemic attack, unspecified: Secondary | ICD-10-CM

## 2010-09-05 DIAGNOSIS — R002 Palpitations: Secondary | ICD-10-CM

## 2010-09-05 DIAGNOSIS — I4892 Unspecified atrial flutter: Secondary | ICD-10-CM

## 2010-09-05 LAB — POCT INR: INR: 3.8

## 2010-09-08 NOTE — Consult Note (Signed)
NAME:  [name], [name] NO.:  [id number]  MEDICAL RECORD NO.:  [id number]  LOCATION:  2003                         FACILITY:  [facility]  PHYSICIAN:  Hillis Range, MD       DATE OF BIRTH:  11/09/1950  DATE OF CONSULTATION: DATE OF DISCHARGE:  09/02/2010                                CONSULTATION   REQUESTING PHYSICIAN:  Rollene Rotunda, MD, [facility]  REASON FOR CONSULTATION:  Atrial flutter.  HISTORY OF PRESENT ILLNESS:  [name] is a pleasant 60 year old gentleman with a history of coronary disease and palpitations who now presents with symptomatic atrial flutter.  The patient reports symptoms of palpitations every few weeks.  Episodes typically last several hours. He was evaluated in the office by Dr. Myrtis Ser and had an event monitor placed.  This apparently documented predominately atrial flutter.  He was placed on Cardizem.  On June 16, he developed abrupt onset of tachy palpitations with mild chest discomfort.  He presented to [facility] and was noted to have typical appearing atrial flutter.  He was placed on intravenous cardioversion and converted to sinus rhythm quite quickly.  He has maintained sinus rhythm since that time.  He has been initiated on Coumadin for anticoagulation.  He is otherwise without complaint.  PAST MEDICAL HISTORY: 1. Previously diagnosed supraventricular tachycardia which may have     actually been atrial flutter.  I do not have this documented     presently. 2. Coronary artery disease with a chronically occluded right coronary     artery with most recent cath in April 2012.  A preserved ejection     fraction. 3. Ongoing alcohol abuse. 4. Tobacco abuse. 5. GERD. 6. Prior TIA. 7. Hypertension. 8. Prior retinal artery occlusion. 9. Benign prostatic hypertrophy. 10.Raynaud's. 11.Nonobstructive carotid stenosis.  SURGICAL HISTORY: 1. Right inguinal hernia repair. 2. Thyroglossal ductal cyst resection.  FAMILY HISTORY:   He is adopted and is unaware of his family history.  SOCIAL HISTORY:  The patient is a [employer]il [employer].  He has a history of tobacco use and has smoked 1 pack per day for 40 years.  He has a history of heavy alcohol consumption and drinks at least 4 beers daily and frequently more.  ALLERGIES:  None.  MEDICATIONS:  Reviewed in the [facility].  REVIEW OF SYSTEMS:  All systems reviewed and negative except as outlined in the HPI above.  PHYSICAL EXAMINATION:  TELEMETRY:  Reveals sinus rhythm. VITALS:  Blood pressure 125/60, heart rate 58, respirations 20, sats 97% on room air, afebrile. GENERAL:  The patient is a well-appearing male in no acute distress.  He is alert and oriented x3. HEENT:  Normocephalic, atraumatic.  Sclerae clear.  Conjunctivae pink. Oropharynx clear. NECK:  Supple.  No thyromegaly, JVD, or bruits. LUNGS:  Clear to auscultation bilaterally. HEART:  Regular rate and rhythm.  No murmurs, rubs, or gallops. GI:  Soft, nontender, nondistended.  Positive bowel sounds. EXTREMITIES:  No clubbing, cyanosis, or edema. SKIN:  No ecchymoses or lacerations. MUSCULOSKELETAL:  No deformity or atrophy. PSYCH:  Euthymic mood.  Full affect.  EKG from June 16 reveals typical appearing atrial flutter of a ventricular  rate of 135 beats per minute.  EKG from June 17 reveals sinus rhythm at 67 beats per minute with a PR interval 144 milliseconds, QRS duration 110 milliseconds the QT interval 410 milliseconds.  Echocardiogram from May 20, 2010 is reviewed and reveals an ejection fraction of 50-55% with inferobasal and distal septal hypokinesis.  The left atrial size is 45 mm and there is no significant valvular abnormality.  Cath is reviewed from April 23 and reveals a chronically occluded right coronary artery with collateral flow from both right and left side.  IMPRESSION:  [name] is a pleasant 60 year old gentleman with a history of tachy palpitations who now presents with  symptomatic typical appearing atrial flutter.  He appears to predominantly have atrial flutter as the source for his tachy palpitations in the past.  His CHAD2 score is 3 due to prior stroke as well as hypertension.  He therefore has been appropriately initiated on Coumadin therapy.  We will continue Cardizem at that time.  I had a long discussion with the patient today regarding the importance of alcohol cessation and controlling atrial arrhythmias long-term.  We also discussed therapeutic strategies for atrial flutter including both catheter ablation and medical therapies.  Risks, benefits, and alternatives to EP study and radiofrequency ablation were discussed at length.  The patient would like to pursue ablation.  We will therefore schedule the patient for elective atrial flutter ablation at the next available time.     Hillis Range, MD     JA/MEDQ  D:  09/02/2010  T:  09/02/2010  Job:  [id number]  cc:   Luis Abed, MD, [facility] Rollene Rotunda, MD, [facility] Dario Guardian, M.D.  Electronically Signed by Hillis Range MD on 09/08/2010 04:49:35 PM

## 2010-09-10 ENCOUNTER — Telehealth: Payer: Self-pay | Admitting: *Deleted

## 2010-09-10 NOTE — Telephone Encounter (Signed)
lmom for patient to call in regards to the ablation I am trying to set up for him  I have it scheduled for 09/20/10 at 7:30am with anes  Pt will need labs on Fri 09/13/10

## 2010-09-11 ENCOUNTER — Encounter: Payer: Self-pay | Admitting: *Deleted

## 2010-09-11 ENCOUNTER — Telehealth: Payer: Self-pay | Admitting: Internal Medicine

## 2010-09-11 NOTE — Telephone Encounter (Signed)
Pt has questions re procedure he didn't know he had scheduled until the hospital called

## 2010-09-11 NOTE — Telephone Encounter (Signed)
Patient aware of time and labs

## 2010-09-12 ENCOUNTER — Other Ambulatory Visit (INDEPENDENT_AMBULATORY_CARE_PROVIDER_SITE_OTHER): Payer: 59 | Admitting: *Deleted

## 2010-09-12 ENCOUNTER — Ambulatory Visit (INDEPENDENT_AMBULATORY_CARE_PROVIDER_SITE_OTHER): Payer: 59 | Admitting: *Deleted

## 2010-09-12 ENCOUNTER — Ambulatory Visit: Payer: 59 | Admitting: Cardiology

## 2010-09-12 ENCOUNTER — Ambulatory Visit: Payer: 59 | Admitting: Internal Medicine

## 2010-09-12 ENCOUNTER — Encounter: Payer: 59 | Admitting: *Deleted

## 2010-09-12 DIAGNOSIS — H3410 Central retinal artery occlusion, unspecified eye: Secondary | ICD-10-CM

## 2010-09-12 DIAGNOSIS — E7800 Pure hypercholesterolemia, unspecified: Secondary | ICD-10-CM

## 2010-09-12 DIAGNOSIS — G459 Transient cerebral ischemic attack, unspecified: Secondary | ICD-10-CM

## 2010-09-12 DIAGNOSIS — I4892 Unspecified atrial flutter: Secondary | ICD-10-CM

## 2010-09-12 DIAGNOSIS — Z7901 Long term (current) use of anticoagulants: Secondary | ICD-10-CM

## 2010-09-12 DIAGNOSIS — I10 Essential (primary) hypertension: Secondary | ICD-10-CM

## 2010-09-12 LAB — POCT INR: INR: 3.6

## 2010-09-12 LAB — CBC WITH DIFFERENTIAL/PLATELET
Basophils Absolute: 0 10*3/uL (ref 0.0–0.1)
Basophils Relative: 0.4 % (ref 0.0–3.0)
Eosinophils Relative: 0.7 % (ref 0.0–5.0)
HCT: 44.2 % (ref 39.0–52.0)
Hemoglobin: 15.1 g/dL (ref 13.0–17.0)
Lymphocytes Relative: 20.4 % (ref 12.0–46.0)
Lymphs Abs: 1.7 10*3/uL (ref 0.7–4.0)
Monocytes Relative: 5.8 % (ref 3.0–12.0)
Neutro Abs: 6.1 10*3/uL (ref 1.4–7.7)
RBC: 4.46 Mil/uL (ref 4.22–5.81)
WBC: 8.4 10*3/uL (ref 4.5–10.5)

## 2010-09-12 LAB — PROTIME-INR: INR: 4.2 ratio — ABNORMAL HIGH (ref 0.8–1.0)

## 2010-09-12 LAB — BASIC METABOLIC PANEL
BUN: 16 mg/dL (ref 6–23)
CO2: 28 mEq/L (ref 19–32)
Calcium: 9.3 mg/dL (ref 8.4–10.5)
Chloride: 104 mEq/L (ref 96–112)
Creatinine, Ser: 1 mg/dL (ref 0.4–1.5)
Glucose, Bld: 109 mg/dL — ABNORMAL HIGH (ref 70–99)

## 2010-09-19 ENCOUNTER — Ambulatory Visit (INDEPENDENT_AMBULATORY_CARE_PROVIDER_SITE_OTHER): Payer: 59 | Admitting: *Deleted

## 2010-09-19 DIAGNOSIS — I4892 Unspecified atrial flutter: Secondary | ICD-10-CM

## 2010-09-19 DIAGNOSIS — G459 Transient cerebral ischemic attack, unspecified: Secondary | ICD-10-CM

## 2010-09-19 DIAGNOSIS — Z7901 Long term (current) use of anticoagulants: Secondary | ICD-10-CM

## 2010-09-20 ENCOUNTER — Ambulatory Visit (HOSPITAL_COMMUNITY)
Admission: RE | Admit: 2010-09-20 | Discharge: 2010-09-20 | Disposition: A | Discharge status: Home / Self Care | Payer: 59 | Source: Ambulatory Visit | Attending: Internal Medicine | Admitting: Internal Medicine

## 2010-09-20 DIAGNOSIS — Z5309 Procedure and treatment not carried out because of other contraindication: Secondary | ICD-10-CM | POA: Insufficient documentation

## 2010-09-20 DIAGNOSIS — Z01812 Encounter for preprocedural laboratory examination: Secondary | ICD-10-CM | POA: Insufficient documentation

## 2010-09-20 DIAGNOSIS — I4891 Unspecified atrial fibrillation: Secondary | ICD-10-CM | POA: Insufficient documentation

## 2010-09-20 DIAGNOSIS — R791 Abnormal coagulation profile: Secondary | ICD-10-CM | POA: Insufficient documentation

## 2010-09-23 ENCOUNTER — Ambulatory Visit (INDEPENDENT_AMBULATORY_CARE_PROVIDER_SITE_OTHER): Payer: 59 | Admitting: *Deleted

## 2010-09-23 DIAGNOSIS — I4892 Unspecified atrial flutter: Secondary | ICD-10-CM

## 2010-09-23 DIAGNOSIS — G459 Transient cerebral ischemic attack, unspecified: Secondary | ICD-10-CM

## 2010-09-23 DIAGNOSIS — Z7901 Long term (current) use of anticoagulants: Secondary | ICD-10-CM

## 2010-09-23 LAB — POCT INR: INR: 1.3

## 2010-09-23 MED ORDER — DABIGATRAN ETEXILATE MESYLATE 150 MG PO CAPS: 150.0000 mg | ORAL_CAPSULE | Freq: Two times a day (BID) | ORAL | Status: DC

## 2010-09-27 ENCOUNTER — Encounter: Payer: 59 | Admitting: *Deleted

## 2010-10-07 NOTE — Discharge Summary (Signed)
NAME:  [name], [name] NO.:  [id number]  MEDICAL RECORD NO.:  [id number]  LOCATION:  2003                         FACILITY:  [facility]  PHYSICIAN:  Hillis Range, MD       DATE OF BIRTH:  18-Sep-1950  DATE OF ADMISSION:  08/31/2010 DATE OF DISCHARGE:  09/02/2010                              DISCHARGE SUMMARY   PRIMARY CARDIOLOGIST:  Luis Abed, MD, [facility]  ELECTROPHYSIOLOGIST:  Hillis Range, MD  PRIMARY CARE PROVIDER:  Dr. Darrell Jewel.  DISCHARGE DIAGNOSES: 1. Atrial flutter, typical appearing.     a.     Coumadin therapy initiated this admission.     b.     Spontaneously converted to normal sinus rhythm. 2. Chest pain secondary to atrial flutter. 3. Alcohol abuse.  SECONDARY DIAGNOSES: 1. Coronary artery disease with chronic total occlusion of the distal     right coronary artery with collaterals both in the right and left     side per cardiac catheterization on July 08, 2010.     a.     Ejection fraction 50% with basal inferior wall akinesis and      mid inferior wall hypokinesis. 2. Ongoing tobacco abuse. 3. Gastroesophageal reflux disease. 4. History of transient ischemic attacks, Aggrenox discontinued this     admission. 5. Hypertension. 6. Retinal artery occlusion. 7. Raynaud syndrome. 8. Nonobstructive carotid stenosis.  ALLERGIES:  No known drug allergies.  PROCEDURES/DIAGNOSTICS PERFORMED DURING HOSPITALIZATION:  Chest x-ray on August 31, 2010:  COPD but no acute findings.  REASON FOR HOSPITALIZATION:  This is a 60 year old gentleman with the above-stated problem list who has been followed by Dr. Myrtis Ser on an outpatient basis and was actually wearing an event recorder.  Recently, the patient was noted to have atrial fibrillation versus flutter and has been referred for an EP evaluation.  On the day of admission, he began to have tachy palpitations that he had in the past.  At this time, they were associated with chest discomfort.  This was similar  to prior angina and was moderate in intensity.  The patient had no associated symptoms. He presented to the emergency department where he was noted to be in atrial flutter at a rate of 135 beats per minute.  The patient was placed on IV Cardizem and converted spontaneously to sinus rhythm.  HOSPITAL COURSE:  The patient was admitted to the telemetry unit and started on IV heparin.  His IV Cardizem was discontinued as he was back in sinus and p.o. Cardizem was initiated.  With the patient's elevated [place] score of 3, Coumadin was initiated.  It was felt that this possibly was an ablatable rhythm.  Therefore, an EP consultation was obtained. Dr. Johney Frame evaluated the patient on September 02, 2010.  The patient remained in sinus rhythm.  EKGs were reviewed and it was felt that the patient did have a typical-appearing atrial flutter that was minimal to ablation.  Risks, benefits, and alternatives to EP study as well as the radiofrequency ablation were discussed with the patient at length and he agreed to proceed.  The patient will be continued on Coumadin and when his INR is therapeutic between 2-3  for 4 weeks, he will be scheduled for an outpatient ablation.  The patient will be continued on Coumadin 5 mg at this time and have an INR checked in 3 days and then weekly thereafter.  With the initiation of Coumadin, his Aggrenox will be discontinued.  Alcohol cessation has been strongly advised with the initiation of Coumadin as well as possible trigger for his arrhythmia. The patient does voice understanding.  The patient had no further complaints of chest pain during admission. It was felt that the patient's chest pain was secondary to his atrial flutter.  The patient had cardiac enzymes cycled and his troponin remained negative x2.  At this time, no further ischemic evaluation is planned.  On the day of discharge, Dr. Johney Frame evaluated the patient and noted him stable for home.  His INR is acute  currently 1.52 with a goal INR of 2- 3.  Therefore, this will be rechecked in 3 days.  Again, alcohol cessation was strongly advised.  A tobacco cessation consult was also obtained.  He is currently contemplating quitting.  The patient is interested in using a nicotine gum to help quit.  He voiced understanding of instructions for gum use.  Of note, his wife is also a smoker, but does plan to quit with him.  Again, the patient remained in normal sinus rhythm.  EKG on the day of discharge was without acute changes.  DISCHARGE LABS:  INR 1.52, WBC 6.5, hemoglobin 15.2, hematocrit 42.3, platelet 209.  DISCHARGE MEDICATIONS: 1. Coumadin 5 mg 1 tablet daily. 2. Diltiazem CD 240 mg 1 tablet daily. 3. Atorvastatin 200 mg 1 tablet daily. 4. Flomax 0.4 mg 1 tablet daily. 5. Viagra 100 mg 1 tablet daily as needed. 6. Please stop taking Aggrenox.  FOLLOWUP PLANS AND INSTRUCTIONS: 1. The patient will present to the [facility] on September 05, 2010, at 2:15 p.m. 2. The office will call to schedule the patient's outpatient ablation     as well as further follow up with Dr. Johney Frame once INR is     therapeutic. 3. The patient should follow up with Dr. Maryellen Pile as previously scheduled. 4. The patient should increase activity as tolerated. 5. The patient should continue a low-sodium, heart-healthy diet.  DURATION OF DISCHARGE:  Greater than 30 minutes with the physician and physician extender time.     [name], PA-C   ______________________________ Hillis Range, MD    NB/MEDQ  D:  09/02/2010  T:  09/03/2010  Job:  [id number]  cc:   Dr. Nena Jordan, MD, [facility]  Electronically Signed by Alen Blew P.A. on 09/08/2010 05:02:10 PM Electronically Signed by Hillis Range MD on 10/07/2010 09:57:29 AM

## 2010-10-18 ENCOUNTER — Ambulatory Visit (HOSPITAL_COMMUNITY)
Admission: RE | Admit: 2010-10-18 | Discharge: 2010-10-18 | Disposition: A | Discharge status: Home / Self Care | Payer: 59 | Source: Ambulatory Visit | Attending: Internal Medicine | Admitting: Internal Medicine

## 2010-10-18 DIAGNOSIS — I6529 Occlusion and stenosis of unspecified carotid artery: Secondary | ICD-10-CM | POA: Insufficient documentation

## 2010-10-18 DIAGNOSIS — K219 Gastro-esophageal reflux disease without esophagitis: Secondary | ICD-10-CM | POA: Insufficient documentation

## 2010-10-18 DIAGNOSIS — F17200 Nicotine dependence, unspecified, uncomplicated: Secondary | ICD-10-CM | POA: Insufficient documentation

## 2010-10-18 DIAGNOSIS — I4892 Unspecified atrial flutter: Secondary | ICD-10-CM | POA: Insufficient documentation

## 2010-10-18 DIAGNOSIS — N400 Enlarged prostate without lower urinary tract symptoms: Secondary | ICD-10-CM | POA: Insufficient documentation

## 2010-10-18 DIAGNOSIS — I2510 Atherosclerotic heart disease of native coronary artery without angina pectoris: Secondary | ICD-10-CM | POA: Insufficient documentation

## 2010-10-18 DIAGNOSIS — F1010 Alcohol abuse, uncomplicated: Secondary | ICD-10-CM | POA: Insufficient documentation

## 2010-10-18 DIAGNOSIS — Z8673 Personal history of transient ischemic attack (TIA), and cerebral infarction without residual deficits: Secondary | ICD-10-CM | POA: Insufficient documentation

## 2010-10-18 DIAGNOSIS — I10 Essential (primary) hypertension: Secondary | ICD-10-CM | POA: Insufficient documentation

## 2010-10-18 LAB — BASIC METABOLIC PANEL
GFR calc Af Amer: >60 mL/min (ref >60)
GFR calc non Af Amer: >60 mL/min (ref >60)
Glucose, Bld: 99 mg/dL (ref 70–99)
Potassium: 4.8 mEq/L (ref 3.5–5.1)
Sodium: 140 mEq/L (ref 135–145)

## 2010-10-18 LAB — CBC
Hemoglobin: 15.3 g/dL (ref 13.0–17.0)
MCHC: 35.3 g/dL (ref 30.0–36.0)
WBC: 6.5 10*3/uL (ref 4.0–10.5)

## 2010-11-06 ENCOUNTER — Encounter: Payer: Self-pay | Admitting: Cardiology

## 2010-11-06 DIAGNOSIS — I4892 Unspecified atrial flutter: Secondary | ICD-10-CM | POA: Insufficient documentation

## 2010-11-07 NOTE — Op Note (Signed)
NAME:  [name], [name] NO.:  [id number]  MEDICAL RECORD NO.:  [id number]  LOCATION:  [facility]                         FACILITY:  [facility]  PHYSICIAN:  Hillis Range, MD       DATE OF BIRTH:  [birth year]  DATE OF PROCEDURE: DATE OF DISCHARGE:                              OPERATIVE REPORT   SURGEON:  Hillis Range, MD  PREPROCEDURE DIAGNOSIS:  Atrial flutter.  POSTPROCEDURE DIAGNOSIS:  Atrial flutter.  PROCEDURES: 1. Comprehensive electrophysiologic study. 2. Coronary sinus pacing and recording. 3. Mapping of supraventricular tachycardia. 4. Radiofrequency ablation of supraventricular tachycardia. 5. Isoproterenol infusion.  INTRODUCTION:  [name] is a pleasant 61 year old gentleman with a history of symptomatic atrial flutter who presents today for EP study and radiofrequency ablation.  He has previously been documented to have typical appearing atrial flutter.  He has required cardioversion but has been unable to maintain sinus rhythm despite Cardizem therapy.  He is appropriately anticoagulated with Pradaxa.  He therefore presents today for EP study and radiofrequency ablation.  DESCRIPTION OF PROCEDURE:  Informed written consent was obtained and the patient was brought to the electrophysiology lab in the fasting state. He was adequately sedated with intravenous medications as outlined in the anesthesia report.  His right groin was prepped and draped in the usual sterile fashion by the EP lab staff.  Using a percutaneous Seldinger technique, two 6-French and one 8-French hemostasis sheaths were placed in the right common femoral vein.  A 6-French decapolar PleurX catheter was introduced through the right common femoral vein and advanced into the coronary sinus for recording and pacing from this location.  A 6-French quadripolar Josephson catheter was introduced through the right common femoral vein and advanced into the right ventricle for recording and  pacing.  This catheter was then pulled back to the His bundle location.  The patient presented to the electrophysiology lab in normal sinus rhythm.  His PR interval was 157 milliseconds with a QRS duration of 101 milliseconds and a QT interval 400 milliseconds.  His RR interval measured 961 milliseconds.  His AH interval measured 73 milliseconds with an HV interval of 46 milliseconds.  Ventricular pacing was performed which revealed midline decremental VA conduction with a VA Wenckebach cycle length of 630 milliseconds.  No arrhythmias were observed.  Rapid atrial pacing was then performed which revealed decremental AV conduction with an AV Wenckebach cycle length of 450 milliseconds with no arrhythmias observed.  Rapid atrial pacing was continued down to a cycle length of 200 milliseconds with no inducible atrial flutter or other arrhythmias observed.  Atrial extra stimulus testing was performed which revealed decremental AV conduction with no AH jumps, echo beats or tachycardias. The AV nodal ERP was 600/420 milliseconds.  Isoproterenol was therefore infused at 2 mcg per minute.  An adequate acceleration in heart rate response was observed.  Ventricular pacing was again performed which revealed decremental VA conduction with a VA Wenckebach cycle length of 450 milliseconds.  Ventricular extra stimulus testing was performed which revealed decremental VA conduction which appeared to be concentric with no retrograde jumps, echo beats or arrhythmias observed.  The ventricular ERP was 500/230 milliseconds.  Atrial  pacing was then performed which revealed PR equal to but not greater than RR.  The AV Wenckebach cycle length was 300 milliseconds with no arrhythmias observed.  Rapid atrial pacing was continued on isoproterenol down to a cycle length of 220 milliseconds with no inducible arrhythmias today. Atrial flutter could not be induced during this procedure. Isoproterenol was therefore  discontinued.  During isoproterenol washout, atrial extra stimulus testing was again performed.  This revealed decremental AV conduction with a single AH jump but no echo beats and no tachycardias observed.  This AH jump appeared to be reproducible but again AVNRT could not be induced.  The AV nodal ERP was 500/280 milliseconds.  As the patient clinically has had typical appearing atrial flutter, I elected to perform empiric cavotricuspid isthmus ablation.  A 7-French [employer] II XP 8-mm ablation catheter was therefore advanced through the right common femoral vein and advanced into the right atrium.  Atrial and ventricular mapping of the cavotricuspid isthmus was performed.  This revealed a rather short isthmus.  A series of three radiofrequency applications were delivered along the cavotricuspid isthmus between the tricuspid valve annulus and the inferior vena cava.  Lesions were delivered at 50 watts with a target temperature of 60 degrees for 120 seconds each.  Following ablation, the stimulus to earliest atrial activation recorded across the isthmus measured 115 milliseconds, however, the stimulus to earliest V was only 140 milliseconds.  I therefore elected to place a 7-French [employer] dual decapolar catheter into the right atrium.  This catheter was positioned around the tricuspid valve annulus. Differential atrial pacing was performed from the low lateral right atrium and clearly confirmed complete bidirectional cavotricuspid isthmus block.  The patient was observed for 20 minutes without return of conduction through the cavotricuspid isthmus.  Following ablation, atrial pacing was again performed which revealed an AV Wenckebach cycle length of 400 milliseconds.  There was no evidence of PR greater than RR and no arrhythmias were induced.  Atrial pacing was continued down to a cycle length of 250 milliseconds with no arrhythmias observed.  Atrial extra  stimulus testing was again performed.  The patient was again observed to have an AH jump but no echo beats, tachycardias or arrhythmias.  The AV nodal ERP was 500/320 milliseconds.  As the patient has not had clinical SVT except for typical appearing atrial flutter and as I could not induce AV nodal reentrant tachycardia today, it was felt prudent to not perform a slow pathway ablation.  The procedure was therefore considered completed.  Following ablation, the AH interval measured 67 milliseconds with an HV interval of 39 milliseconds.  All catheters were removed and sheaths were aspirated and flushed.  The sheaths were removed and hemostasis was assured.  There were no early apparent complications.  CONCLUSIONS: 1. Sinus rhythm upon presentation. 2. No inducible supraventricular tachycardia today. 3. No inducible atrial flutter. 4. No evidence of accessory pathways. 5. The patient did have dual AV nodal physiology, however, AV nodal     reentrant tachycardia could not be induced both on and off     isoproterenol. 6. Empiric cavotricuspid isthmus ablation was performed with complete     bidirectional isthmus block achieved. 7. No inducible arrhythmias following ablation. 8. No early apparent complications.     Hillis Range, MD   JA/MEDQ  D:  10/18/2010  T:  10/18/2010  Job:  [id number]  cc:   Luis Abed, MD, [facility] Dario Guardian, M.D.  Electronically Signed  by Hillis Range MD on 11/07/2010 09:43:54 AM

## 2010-11-07 NOTE — Discharge Summary (Signed)
NAME:  [name], [name] NO.:  [id number]  MEDICAL RECORD NO.:  [id number]  LOCATION:  3737                         FACILITY:  [facility]  PHYSICIAN:  Hillis Range, MD       DATE OF BIRTH:  08-Jul-1950  DATE OF ADMISSION:  10/18/2010 DATE OF DISCHARGE:  10/18/2010                              DISCHARGE SUMMARY   PRIMARY CARE PHYSICIAN:  Ardeen Garland, MD  PRIMARY CARDIOLOGIST:  Luis Abed, MD, [facility]  ELECTROPHYSIOLOGIST:  Hillis Range, MD  PRIMARY DIAGNOSIS:  Atrial flutter.  SECONDARY DIAGNOSES: 1. Coronary artery disease with a chronically occluded right coronary     artery on a most recent cath in April 2012.  The patient has     preserved ejection fraction. 2. Ongoing alcohol abuse. 3. Tobacco abuse. 4. Gastroesophageal reflux disease. 5. Prior transient ischemic attack. 6. Hypertension. 7. Prior retinal artery occlusion. 8. Benign prostatic hypertrophy. 9. Raynaud's. 10.Nonobstructive carotid stenosis.  ALLERGIES:  The patient has no known drug allergies.  PROCEDURES THIS ADMISSION: 1. Electrophysiology study and radiofrequency catheter ablation of     atrial flutter on October 18, 2010, by Dr. Johney Frame.  This study     demonstrated sinus rhythm upon presentation.  There was no     inducible supraventricular tachycardia or inducible atrial flutter.     The patient did have dual AV nodal physiology, however, AV nodal     reentrant tachycardia cannot not be induced neither on nor off     Isuprel.  The patient did undergo empiric cavotricuspid isthmus     ablation with complete bidirectional isthmus block achieved.  The     patient had no early apparent complications.  BRIEF HISTORY OF PRESENT ILLNESS:  [name] is a 60 year old male with a history of coronary artery disease and palpitations who had documented atrial flutter.  He was placed on Cardizem therapy and had recurrent atrial flutter.  He was evaluated by Dr. Johney Frame in the outpatient setting  for treatment options.  Risks, benefits, and alternatives of catheter ablation were reviewed the patient who wished to proceed.  HOSPITAL COURSE:  The patient was admitted this morning for planned ablation of atrial flutter.  This was carried out by Dr. Johney Frame with details as outlined above.  He was monitoring on telemetry which demonstrated sinus bradycardia.  His groin incision was without hematoma or bruit.  Dr. Johney Frame felt that the patient will be stable for discharge later this afternoon after his bedrest is complete and ambulation is done.  DISCHARGE INSTRUCTIONS: 1. Increase activity slowly. 2. No driving for 4 days. 3. Follow low-sodium, heart-healthy diet. 4. Keep incisions clean and dry.  FOLLOWUP APPOINTMENTS: 1. Luis Abed, MD, [facility] on November 08, 2010, at 4 p.m. 2. Hillis Range, MD, on November 20, 2010, at 10 a.m. 3. Ardeen Garland, MD as scheduled.  DISCHARGE MEDICATIONS: 1. Pradaxa 150 mg 1 tablet twice daily - the patient should continue     this medication until seen by Dr. Johney Frame. 2. Flomax 0.4 mg daily. 3. Atorvastatin 40 mg daily. 4. Viagra 100 mg as needed.  Of note, the patient's diltiazem was discontinued this admission secondary to  successful ablation of atrial flutter.  DISPOSITION:  The patient was seen and examined by Dr. Johney Frame on the afternoon of October 18, 2010, considered stable for discharge.  DURATION OF DISCHARGE ENCOUNTER:  35 minutes.     Gypsy Balsam, RN,BSN   ______________________________ Hillis Range, MD    AS/MEDQ  D:  10/18/2010  T:  10/19/2010  Job:  [id number]  cc:   Luis Abed, MD, [facility] Ardeen Garland, M.D.  Electronically Signed by Gypsy Balsam RNBSN on 11/01/2010 01:17:54 PM Electronically Signed by Hillis Range MD on 11/07/2010 09:43:57 AM

## 2010-11-08 ENCOUNTER — Encounter: Payer: Self-pay | Admitting: Cardiology

## 2010-11-08 ENCOUNTER — Ambulatory Visit (INDEPENDENT_AMBULATORY_CARE_PROVIDER_SITE_OTHER): Payer: 59 | Admitting: Cardiology

## 2010-11-08 DIAGNOSIS — I498 Other specified cardiac arrhythmias: Secondary | ICD-10-CM

## 2010-11-08 DIAGNOSIS — I2510 Atherosclerotic heart disease of native coronary artery without angina pectoris: Secondary | ICD-10-CM

## 2010-11-08 DIAGNOSIS — I10 Essential (primary) hypertension: Secondary | ICD-10-CM

## 2010-11-08 DIAGNOSIS — I471 Supraventricular tachycardia: Secondary | ICD-10-CM

## 2010-11-08 NOTE — Assessment & Plan Note (Signed)
Coronary disease is stable. No change in therapy. 

## 2010-11-08 NOTE — Assessment & Plan Note (Signed)
The patient is doing well post flutter ablation.  He does have some early beats heard on physical exam.  I suspect that he may have some atrial bigeminy at times.  He has not had any return of his sustained arrhythmia.  No change in therapy.

## 2010-11-08 NOTE — Assessment & Plan Note (Signed)
Blood pressure is controlled. No change in therapy. 

## 2010-11-08 NOTE — Patient Instructions (Signed)
Your physician recommends that you schedule a follow-up appointment in: 3 months.  

## 2010-11-08 NOTE — Progress Notes (Signed)
HPI Patient is seen today post hospitalization for atrial flutter ablation. When I saw him last in the office in May, [id number] range for him to be evaluated for the possibility of a treatable supraventricular tachycardia.  It was felt that he did have atrial flutter.  He did undergo a flutter ablation.  As part of today's evaluation I have reviewed the records concerning this procedure.  He is now seen for followup.He has not had any sustained palpitations.  He has had some very brief sensations of palpitations.  He also has coronary artery disease.  He has been stable.  He has not had any significant chest pain. Dennis KitchenNo Known Allergies  Current Outpatient Prescriptions  Medication Sig Dispense Refill  . atorvastatin (LIPITOR) 20 MG tablet Take 20 mg by mouth daily.        . calcium carbonate (TUMS - DOSED IN MG ELEMENTAL CALCIUM) 500 MG chewable tablet Chew 1 tablet by mouth as needed.        . dabigatran (PRADAXA) 150 MG CAPS Take 1 capsule (150 mg total) by mouth every 12 (twelve) hours.  60 capsule  1  . sildenafil (VIAGRA) 100 MG tablet Take 100 mg by mouth daily as needed.        . Tamsulosin HCl (FLOMAX) 0.4 MG CAPS 0.4 mg. 1 tab qd         History   Social History  . Marital Status: Married    Spouse Name: N/A    Number of Children: N/A  . 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 Smoking status: Current Everyday Smoker    Types: Cigarettes  . Smokeless tobacco: Not on file   Comment: 1 pack a day  . Alcohol Use: Not on file  . Drug Use: Yes  . Sexually Active: Not on file   Other Topics Concern  . Not on file   Social History Narrative  . No narrative on file    Family History  Problem Relation Age of Onset  . Hypertension      Past Medical History  Diagnosis Date  . Adenomatous colon polyp   . Palpitations     probable SVT, rate 170,, at home,, lasted one hour,, 2012  . Hypercholesterolemia   . GERD  (gastroesophageal reflux disease)   . Alcohol use   . Hypertension   . Raynaud's syndrome   . TIA (transient ischemic attack)     Possible small vessel tia in the past.  . CRAO (central retinal artery occlusion)   . BPH (benign prostatic hypertrophy)   . Carotid artery disease     0-39% R. ICA, 40-59% LICA... Doppler.... January, 2010  /  Doppler... in January, 2011 no change  /  doppler1/27/2012...stable  0-39% RICA 40-59% LICA  . Tobacco abuse   . Antiphospholipid antibody positive     ???In the past???but not proven according to patient  . Thyroid cyst     Removed in the past  . Ejection fraction      EF 50-55%, echo, March, 2012... inferobasal and distal septal hypokinesis /      60%, echo, November, 2008  . CAD (coronary artery disease)     Catheterization 2005 disease / catheterization July 06, 2010, chronic total occlusion distal RCA with collaterals from right and left side.  Medical therapy recommended, mild decreased LV function       . Antiphospholipid antibody positive     ???  in the past, but not proven according to the patient.  . Atrial flutter     atrial flutter ablation,   Dr.Allred October 18, 2010    Past Surgical History  Procedure Date  . Cardiac catheterization 06/27/2003    2005.... mild irregularity of the LAD..( patient had had abnormal Myoview scan)  . Inguinal hernia repair     right    ROS  Patient denies fever, chills, headache, sweats, rash, change in vision, change in hearing, chest pain, cough, nausea vomiting, urinary symptoms.  All other systems are reviewed and are negative.  PHYSICAL EXAM Patient looks quite good today.  He is here with his wife.  He is oriented to person time and place.  Affect is normal.  Head is atraumatic.  He has carotid bruits.  He has the reddish tint to his facial skin as noted before.  Lungs are clear.  Respiratory effort is nonlabored.  Cardiac exam reveals S1 and S2.  There no significant murmurs.  His rhythm is a  bigeminal rhythm at the time of my exam.  EKG was done however showing sinus rhythm.  Abdomen is soft.  There is no peripheral edema.  There no musculoskeletal deformities.  There are no skin rashes. Filed Vitals:   11/08/10 1601  BP: 122/73  Pulse: 76  Height: 6\' 2"  (1.88 m)  Weight: 174 lb (78.926 kg)    EKG  Is done today and reviewed by me.  The QRS is normal.  There is normal sinus rhythm at the time of evaluation no premature beats seen.  ASSESSMENT & PLAN

## 2010-11-20 ENCOUNTER — Ambulatory Visit (INDEPENDENT_AMBULATORY_CARE_PROVIDER_SITE_OTHER): Payer: 59 | Admitting: Internal Medicine

## 2010-11-20 ENCOUNTER — Encounter: Payer: Self-pay | Admitting: Internal Medicine

## 2010-11-20 VITALS — BP 118/63 | HR 65 | Ht 74.0 in | Wt 174.0 lb

## 2010-11-20 DIAGNOSIS — I4892 Unspecified atrial flutter: Secondary | ICD-10-CM

## 2010-11-20 DIAGNOSIS — F17200 Nicotine dependence, unspecified, uncomplicated: Secondary | ICD-10-CM

## 2010-11-20 DIAGNOSIS — Z720 Tobacco use: Secondary | ICD-10-CM

## 2010-11-20 NOTE — Progress Notes (Signed)
Addended by: Sherri Rad C on: 11/20/2010 10:57 AM   Modules accepted: Orders

## 2010-11-20 NOTE — Assessment & Plan Note (Signed)
He is not ready to quit 

## 2010-11-20 NOTE — Progress Notes (Signed)
The patient presents today for routine electrophysiology followup.  Since his atrial flutter ablation, the patient reports doing very well.  He has occasional "skipped beats" but denies any sustained tachycardia. Today, he denies symptoms of chest pain, shortness of breath, orthopnea, PND, lower extremity edema, dizziness, presyncope, syncope, or neurologic sequela.  The patient feels that he is tolerating medications without difficulties and is otherwise without complaint today.   Past Medical History  Diagnosis Date  . Adenomatous colon polyp   . Palpitations     probable SVT, rate 170,, at home,, lasted one hour,, 2012  . Hypercholesterolemia   . GERD (gastroesophageal reflux disease)   . Alcohol use   . Hypertension   . Raynaud's syndrome   . TIA (transient ischemic attack)     Possible small vessel tia in the past.  . CRAO (central retinal artery occlusion)   . BPH (benign prostatic hypertrophy)   . Carotid artery disease     0-39% R. ICA, 40-59% LICA... Doppler.... January, 2010  /  Doppler... in January, 2011 no change  /  doppler1/27/2012...stable  0-39% RICA 40-59% LICA  . Tobacco abuse   . Antiphospholipid antibody positive     ???In the past???but not proven according to patient  . Thyroid cyst     Removed in the past  . Ejection fraction      EF 50-55%, echo, March, 2012... inferobasal and distal septal hypokinesis /      60%, echo, November, 2008  . CAD (coronary artery disease)     Catheterization 2005 disease / catheterization July 06, 2010, chronic total occlusion distal RCA with collaterals from right and left side.  Medical therapy recommended, mild decreased LV function       . Antiphospholipid antibody positive     ??? in the past, but not proven according to the patient.  . Atrial flutter     atrial flutter ablation,   Dr.Muhannad Bignell October 18, 2010   Past Surgical History  Procedure Date  . Cardiac catheterization 06/27/2003    2005.... mild irregularity of the  LAD..( patient had had abnormal Myoview scan)  . Inguinal hernia repair     right    Current Outpatient Prescriptions  Medication Sig Dispense Refill  . atorvastatin (LIPITOR) 40 MG tablet Take 40 mg by mouth daily.        . calcium carbonate (TUMS - DOSED IN MG ELEMENTAL CALCIUM) 500 MG chewable tablet Chew 1 tablet by mouth as needed.        . dabigatran (PRADAXA) 150 MG CAPS Take 1 capsule (150 mg total) by mouth every 12 (twelve) hours.  60 capsule  1  . sildenafil (VIAGRA) 100 MG tablet Take 100 mg by mouth daily as needed.        . Tamsulosin HCl (FLOMAX) 0.4 MG CAPS 0.4 mg. 1 tab qd         No Known Allergies  History   Social History  . Marital Status: Married    Spouse Name: N/A    Number of Children: N/A  . 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 Smoking status: Current Everyday Smoker    Types: Cigarettes  . Smokeless tobacco: Not on file   Comment: 1 pack a day  . Alcohol Use: Yes     he continues to drink heavily despite my recommendation to quit  . Drug Use: Yes  . Sexually Active:  Not on file   Other Topics Concern  . Not on file   Social History Narrative  . No narrative on file    Family History  Problem Relation Age of Onset  . Hypertension      ROS-  All systems are reviewed and are negative except as outlined in the HPI above    Physical Exam: Filed Vitals:   11/20/10 1023  BP: 118/63  Pulse: 65  Height: 6\' 2"  (1.88 m)  Weight: 174 lb (78.926 kg)    GEN- The patient is well appearing, alert and oriented x 3 today.   Head- normocephalic, atraumatic Eyes-  Sclera clear, conjunctiva pink Ears- hearing intact Oropharynx- clear Neck- supple, no JVP Lymph- no cervical lymphadenopathy Lungs- Clear to ausculation bilaterally, normal work of breathing Heart- Regular rate and rhythm, no murmurs, rubs or gallops, PMI not laterally displaced GI- soft, NT, ND, + BS Extremities- no  clubbing, cyanosis, or edema MS- no significant deformity or atrophy Skin- no rash or lesion Psych- euthymic mood, full affect Neuro- strength and sensation are intact  ekg today reveals sinus rhythm 62 bpm, otherwise normal ekg  Assessment and Plan:

## 2010-11-20 NOTE — Patient Instructions (Signed)
Your physician has recommended you make the following change in your medication:  1) Stop Pradaxa.  Keep your regular followup with Dr. Myrtis Ser on: 02/11/11 at 4:15pm.  Dr. Johney Frame will see you back as needed.

## 2010-11-20 NOTE — Assessment & Plan Note (Signed)
Doing well s/p ablation without recurrence Stop pradaxa  ETOH avoidance again stressed today to avoid long term atrial arrhythmias. He is not ready to quit.

## 2010-12-16 LAB — COMPREHENSIVE METABOLIC PANEL
ALT: 27
Alkaline Phosphatase: 54
CO2: 28
GFR calc non Af Amer: >60
Glucose, Bld: 68 — ABNORMAL LOW
Potassium: 4.3
Sodium: 139
Total Bilirubin: 0.6

## 2010-12-16 LAB — CBC
Hemoglobin: 14.3
MCHC: 34.2
RBC: 4.26

## 2010-12-16 LAB — DIFFERENTIAL
Basophils Relative: 1
Eosinophils Absolute: 0.2
Neutrophils Relative %: 54

## 2011-02-11 ENCOUNTER — Encounter: Payer: Self-pay | Admitting: Cardiology

## 2011-02-11 ENCOUNTER — Ambulatory Visit (INDEPENDENT_AMBULATORY_CARE_PROVIDER_SITE_OTHER): Payer: 59 | Admitting: Cardiology

## 2011-02-11 DIAGNOSIS — I2510 Atherosclerotic heart disease of native coronary artery without angina pectoris: Secondary | ICD-10-CM

## 2011-02-11 DIAGNOSIS — I498 Other specified cardiac arrhythmias: Secondary | ICD-10-CM

## 2011-02-11 DIAGNOSIS — I471 Supraventricular tachycardia: Secondary | ICD-10-CM

## 2011-02-11 NOTE — Assessment & Plan Note (Signed)
Patient's flutter has been ablated and he is stable.  No change in therapy.

## 2011-02-11 NOTE — Assessment & Plan Note (Signed)
Coronary disease is stable. No change in therapy. 

## 2011-02-11 NOTE — Patient Instructions (Signed)
Your physician wants you to follow-up in: 6 MONTHS WITH DR Myrtis Ser  BUMP  CAROTIDS  OUT TO THIS 6 MONTH VISIT You will receive a reminder letter in the mail two months in advance. If you don't receive a letter, please call our office to schedule the follow-up appointment. Your physician recommends that you continue on your current medications as directed. Please refer to the Current Medication list given to you today.

## 2011-02-11 NOTE — Progress Notes (Signed)
HPI   Patient is seen today to followup coronary disease and atrial flutter.  He's not having any chest pain.  His flutter was ablated by Dr. Johney Frame.  I saw him last August, 2012.  Dr. Johney Frame saw him in September, 2012.  Patient has not had any recurrent palpitations.  No Known Allergies  Current Outpatient Prescriptions  Medication Sig Dispense Refill  . aspirin 81 MG tablet Take 81 mg by mouth daily.        Marland Kitchen atorvastatin (LIPITOR) 40 MG tablet Take 40 mg by mouth daily.        . calcium carbonate (TUMS - DOSED IN MG ELEMENTAL CALCIUM) 500 MG chewable tablet Chew 1 tablet by mouth as needed.        . Tamsulosin HCl (FLOMAX) 0.4 MG CAPS 0.4 mg. 1 tab qd         History   Social History  . Marital Status: Married    Spouse Name: N/A    Number of Children: N/A  . 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 Smoking status: Current Everyday Smoker    Types: Cigarettes  . Smokeless tobacco: Not on file   Comment: 1 pack a day  . Alcohol Use: Yes     he continues to drink heavily despite my recommendation to quit  . Drug Use: Yes  . Sexually Active: Not on file   Other Topics Concern  . Not on file   Social History Narrative  . No narrative on file    Family History  Problem Relation Age of Onset  . Hypertension      Past Medical History  Diagnosis Date  . Adenomatous colon polyp   . Palpitations     probable SVT, rate 170,, at home,, lasted one hour,, 2012  . Hypercholesterolemia   . GERD (gastroesophageal reflux disease)   . Alcohol use   . Hypertension   . Raynaud's syndrome   . TIA (transient ischemic attack)     Possible small vessel tia in the past.  . CRAO (central retinal artery occlusion)   . BPH (benign prostatic hypertrophy)   . Carotid artery disease     0-39% R. ICA, 40-59% LICA... Doppler.... January, 2010  /  Doppler... in January, 2011 no change  /  doppler1/27/2012...stable  0-39% RICA  40-59% LICA  . Tobacco abuse   . Antiphospholipid antibody positive     ???In the past???but not proven according to patient  . Thyroid cyst     Removed in the past  . Ejection fraction      EF 50-55%, echo, March, 2012... inferobasal and distal septal hypokinesis /      60%, echo, November, 2008  . CAD (coronary artery disease)     Catheterization 2005 disease / catheterization July 06, 2010, chronic total occlusion distal RCA with collaterals from right and left side.  Medical therapy recommended, mild decreased LV function       . Antiphospholipid antibody positive     ??? in the past, but not proven according to the patient.  . Atrial flutter     atrial flutter ablation,   Dr.Allred October 18, 2010    Past Surgical History  Procedure Date  . Cardiac catheterization 06/27/2003    2005.... mild irregularity of the LAD..( patient had had abnormal Myoview scan)  . Inguinal hernia repair     right    ROS  Patient denies fever, chills, headache, sweats, rash, change in vision, change in hearing, chest pain, cough, nausea vomiting, urinary symptoms.  All other systems are reviewed and are negative.  PHYSICAL EXAM  Patient is stable.  There is no jugular venous distention.  Lungs are clear.  Respiratory effort is nonlabored.  Cardiac exam reveals an S1-S2.  No clicks or significant murmurs.  The abdomen is soft.  There is no peripheral edema.  His skin has the reddish tone of Raynaud's phenomenon  Filed Vitals:   02/11/11 1615  BP: 132/73  Pulse: 78  Height: 6\' 2"  (1.88 m)  Weight: 174 lb (78.926 kg)     ASSESSMENT & PLAN

## 2011-04-17 ENCOUNTER — Telehealth: Payer: Self-pay | Admitting: Cardiology

## 2011-04-17 NOTE — Telephone Encounter (Signed)
Walk in pt Form " Pt dropped Off Sickness Claim Form, needs to be Completed" sent to Katz/Debby L  04/17/11/KM

## 2011-04-18 ENCOUNTER — Telehealth: Payer: Self-pay | Admitting: Cardiology

## 2011-04-18 NOTE — Telephone Encounter (Signed)
Per [name], records need to indicate that [name] has had a MI in the past.

## 2011-04-18 NOTE — Telephone Encounter (Signed)
Faxed Physicians Statement to [facility] @ [phone],[id number] Pt Original Per His Request 04/18/11/KM

## 2011-04-18 NOTE — Telephone Encounter (Signed)
New Problem   Valetta Fuller [facility] [employer] [phone]   Needs to speak with Dr. Regarding physician's statement that was sent over for patient

## 2011-04-21 ENCOUNTER — Encounter: Payer: Self-pay | Admitting: Cardiology

## 2011-04-21 NOTE — Progress Notes (Signed)
I've been trying to complete an insurance form for the patient. In an attempt to try to get all of the events clearly outlined I have reviewed the chart.  When I saw the patient in the office on July 05, 2010, is was noted that he had had a change in his noninvasive testing. Therefore cardiac catheterization was done. The study was done in April, 2012. It showed that he had a totally occluded right coronary artery with collaterals. This was to be treated medically. There is a technical question as to whether or not the patient has had a myocardial infarction. He was not admitted to the hospital with enzyme elevation. However at some point his right coronary artery did occlude. This occurred somewhere before April, 2012.  After the catheterization it was noted the patient had recurrent palpitations. Ultimately a November Courter showed that he had supraventricular tachycardia. I was concerned that this might be atrial flutter. He was referred for evaluation and it turned out to be atrial flutter. On October 18, 2010 he had an atrial flutter ablation by Dr. Johney Frame. He's done very well with this.  It is very clear that the patient needed to be out of work in April, 2012 when he had his cardiac evaluation. He also needed to be out of work when he had his atrial flutter ablation in August, 2012.  Jerral Bonito, MD

## 2011-04-23 NOTE — Telephone Encounter (Signed)
Faxed note from Dr Myrtis Ser supporting his diagnosis.

## 2011-04-24 NOTE — Telephone Encounter (Signed)
New problem He needs more info from Dr Myrtis Ser please call

## 2011-04-24 NOTE — Telephone Encounter (Signed)
Mr Susette Racer needs the exact date of the occlusion or the note from that date.

## 2011-04-24 NOTE — Telephone Encounter (Signed)
Additional information faxed.

## 2011-04-25 NOTE — Telephone Encounter (Signed)
F/U  Valetta Fuller [facility] [employer] [phone]   Insurance needs EKG & Enzime level report to process patients claims form.  Please return call to Mr. Susette Racer.  Information needs to be faxed ASAP [phone]

## 2011-04-25 NOTE — Telephone Encounter (Signed)
Labs and EKG from April 2012 were faxed.

## 2011-05-27 ENCOUNTER — Ambulatory Visit (INDEPENDENT_AMBULATORY_CARE_PROVIDER_SITE_OTHER): Payer: BC Managed Care – PPO | Admitting: Family Medicine

## 2011-05-27 VITALS — BP 158/73 | HR 66 | Temp 97.8°F | Resp 18 | Ht 72.0 in | Wt 170.0 lb

## 2011-05-27 DIAGNOSIS — S81809A Unspecified open wound, unspecified lower leg, initial encounter: Secondary | ICD-10-CM

## 2011-05-27 DIAGNOSIS — T148XXA Other injury of unspecified body region, initial encounter: Secondary | ICD-10-CM

## 2011-05-27 DIAGNOSIS — S81009A Unspecified open wound, unspecified knee, initial encounter: Secondary | ICD-10-CM

## 2011-05-27 DIAGNOSIS — W540XXA Bitten by dog, initial encounter: Secondary | ICD-10-CM

## 2011-05-27 MED ORDER — DOXYCYCLINE HYCLATE 100 MG PO TABS: 100.0000 mg | ORAL_TABLET | Freq: Two times a day (BID) | ORAL | Status: AC

## 2011-05-27 NOTE — Progress Notes (Signed)
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 He was a [place] whose owner was identified and apparently the shots are up to date.  A report was filed with Research scientist (life sciences) service. Patient is here to have the wound evaluated 24 hours after the incident. Tetanus status is up-to-date with last injection 3 years ago  Objective: There is a 2-3 cm open laceration with jagged edges of the left gluteal area. There is mild erythema and ecchymosis adjacent.  There is no bleeding or discharge.  Assessment: Recent dog bite-30 wound  Plan wash wound, bandage, start doxycycline 100 twice a day x1 week, patient to return for further problems, reports sent to control

## 2011-06-21 ENCOUNTER — Ambulatory Visit (INDEPENDENT_AMBULATORY_CARE_PROVIDER_SITE_OTHER): Payer: BC Managed Care – PPO | Admitting: Internal Medicine

## 2011-06-21 VITALS — BP 167/79 | HR 68 | Temp 97.7°F | Resp 16 | Ht 73.0 in | Wt 170.0 lb

## 2011-06-21 DIAGNOSIS — I2510 Atherosclerotic heart disease of native coronary artery without angina pectoris: Secondary | ICD-10-CM

## 2011-06-21 DIAGNOSIS — N400 Enlarged prostate without lower urinary tract symptoms: Secondary | ICD-10-CM

## 2011-06-21 DIAGNOSIS — E789 Disorder of lipoprotein metabolism, unspecified: Secondary | ICD-10-CM

## 2011-06-21 DIAGNOSIS — E782 Mixed hyperlipidemia: Secondary | ICD-10-CM

## 2011-06-21 DIAGNOSIS — Z7189 Other specified counseling: Secondary | ICD-10-CM

## 2011-06-21 DIAGNOSIS — F17200 Nicotine dependence, unspecified, uncomplicated: Secondary | ICD-10-CM

## 2011-06-21 DIAGNOSIS — I10 Essential (primary) hypertension: Secondary | ICD-10-CM

## 2011-06-21 LAB — POCT UA - MICROSCOPIC ONLY
Mucus, UA: NEGATIVE
Yeast, UA: NEGATIVE

## 2011-06-21 LAB — POCT URINALYSIS DIPSTICK
Bilirubin, UA: NEGATIVE
Ketones, UA: NEGATIVE
Leukocytes, UA: NEGATIVE
Protein, UA: NEGATIVE

## 2011-06-21 LAB — BASIC METABOLIC PANEL
BUN: 16 mg/dL (ref 6–23)
Potassium: 4.6 mEq/L (ref 3.5–5.3)
Sodium: 138 mEq/L (ref 135–145)

## 2011-06-21 MED ORDER — LISINOPRIL 10 MG PO TABS: 10.0000 mg | ORAL_TABLET | Freq: Every day | ORAL | Status: DC

## 2011-06-21 MED ORDER — TAMSULOSIN HCL 0.4 MG PO CAPS: 0.4000 mg | ORAL_CAPSULE | Freq: Every day | ORAL | Status: DC

## 2011-06-21 MED ORDER — ATORVASTATIN CALCIUM 40 MG PO TABS: 40.0000 mg | ORAL_TABLET | Freq: Every day | ORAL | Status: DC

## 2011-06-21 NOTE — Progress Notes (Signed)
  Subjective:    Patient ID: [name], male    DOB: [birth year], 61 y.o.   MRN: [id number]  HPI Feels good. See complex problem list. Has heart disease and vascular disease and HTN and Lipids  Review of Systems Time for CPE next month    Objective:   Physical Exam Normal and stable but HTN untxed Results for orders placed in visit on 06/21/11  POCT URINALYSIS DIPSTICK      Component Value Range   Color, UA yellow     Clarity, UA clear     Glucose, UA neg     Bilirubin, UA neg'     Ketones, UA neg     Spec Grav, UA <=1.005     Blood, UA neg     pH, UA 5.5     Protein, UA neg'     Urobilinogen, UA 0.2     Nitrite, UA neg     Leukocytes, UA Negative    POCT UA - MICROSCOPIC ONLY      Component Value Range   WBC, Ur, HPF, POC 0-1     RBC, urine, microscopic 3-4     Bacteria, U Microscopic trace     Mucus, UA neg     Epithelial cells, urine per micros 0-3     Crystals, Ur, HPF, POC neg     Casts, Ur, LPF, POC neg     Yeast, UA neg           Assessment & Plan:  Start Lisinoptil 10mg  qd Dash diet and quit smoking quit plan made-nicorrete CPE soon

## 2011-06-21 NOTE — Patient Instructions (Signed)
Hdash DASH Diet The DASH diet stands for "Dietary Approaches to Stop Hypertension." It is a healthy eating plan that has been shown to reduce high blood pressure (hypertension) in as little as 14 days, while also possibly providing other significant health benefits. These other health benefits include reducing the risk of breast cancer after menopause and reducing the risk of type 2 diabetes, heart disease, colon cancer, and stroke. Health benefits also include weight loss and slowing kidney failure in patients with chronic kidney disease.  DIET GUIDELINES  Limit salt (sodium). Your diet should contain less than 1500 mg of sodium daily.   Limit refined or processed carbohydrates. Your diet should include mostly whole grains. Desserts and added sugars should be used sparingly.   Include small amounts of heart-healthy fats. These types of fats include nuts, oils, and tub margarine. Limit saturated and trans fats. These fats have been shown to be harmful in the body.  CHOOSING FOODS  The following food groups are based on a 2000 calorie diet. See your Registered Dietitian for individual calorie needs. Grains and Grain Products (6 to 8 servings daily)  Eat More Often: Whole-wheat bread, brown rice, whole-grain or wheat pasta, quinoa, popcorn without added fat or salt (air popped).   Eat Less Often: White bread, white pasta, white rice, cornbread.  Vegetables (4 to 5 servings daily)  Eat More Often: Fresh, frozen, and canned vegetables. Vegetables may be raw, steamed, roasted, or grilled with a minimal amount of fat.   Eat Less Often/Avoid: Creamed or fried vegetables. Vegetables in a cheese sauce.  Fruit (4 to 5 servings daily)  Eat More Often: All fresh, canned (in natural juice), or frozen fruits. Dried fruits without added sugar. One hundred percent fruit juice ( cup [237 mL] daily).   Eat Less Often: Dried fruits with added sugar. Canned fruit in light or heavy syrup.  [employer], Fish,  and Poultry (2 servings or less daily. One serving is 3 to 4 oz [85-114 g]).  Eat More Often: Ninety percent or leaner ground beef, tenderloin, sirloin. Round cuts of beef, chicken breast, [place] breast. All fish. Grill, bake, or broil your meat. Nothing should be fried.   Eat Less Often/Avoid: Fatty cuts of meat, [place], or chicken leg, thigh, or wing. Fried cuts of meat or fish.  Dairy (2 to 3 servings)  Eat More Often: Low-fat or fat-free milk, low-fat plain or light yogurt, reduced-fat or part-skim cheese.   Eat Less Often/Avoid: Milk (whole, 2%, skim, or chocolate).Whole milk yogurt. Full-fat cheeses.  Nuts, Seeds, and Legumes (4 to 5 servings per week)  Eat More Often: All without added salt.   Eat Less Often/Avoid: Salted nuts and seeds, canned beans with added salt.  Fats and Sweets (limited)  Eat More Often: Vegetable oils, tub margarines without trans fats, sugar-free gelatin. Mayonnaise and salad dressings.   Eat Less Often/Avoid: Coconut oils, palm oils, butter, stick margarine, cream, half and half, cookies, candy, pie.  FOR MORE INFORMATION The Dash Diet Eating Plan: www.dashdiet.org Document Released: 02/20/2011 Document Reviewed: 02/10/2011 [facility] Patient Information 2012 [place], [place].ypertension As your heart beats, it forces blood through your arteries. This force is your blood pressure. If the pressure is too high, it is called hypertension (HTN) or high blood pressure. HTN is dangerous because you may have it and not know it. High blood pressure may mean that your heart has to work harder to pump blood. Your arteries may be narrow or stiff. The extra work puts you  at risk for heart disease, stroke, and other problems.  Blood pressure consists of two numbers, a higher number over a lower, 110/72, for example. It is stated as "110 over 72." The ideal is below 120 for the top number (systolic) and under 80 for the bottom (diastolic). Write down your blood pressure  today. You should pay close attention to your blood pressure if you have certain conditions such as:  Heart failure.   Prior heart attack.   Diabetes   Chronic kidney disease.   Prior stroke.   Multiple risk factors for heart disease.  To see if you have HTN, your blood pressure should be measured while you are seated with your arm held at the level of the heart. It should be measured at least twice. A one-time elevated blood pressure reading (especially in the Emergency Department) does not mean that you need treatment. There may be conditions in which the blood pressure is different between your right and left arms. It is important to see your caregiver soon for a recheck. Most people have essential hypertension which means that there is not a specific cause. This type of high blood pressure may be lowered by changing lifestyle factors such as:  Stress.   Smoking.   Lack of exercise.   Excessive weight.   Drug/tobacco/alcohol use.   Eating less salt.  Most people do not have symptoms from high blood pressure until it has caused damage to the body. Effective treatment can often prevent, delay or reduce that damage. TREATMENT  When a cause has been identified, treatment for high blood pressure is directed at the cause. There are a large number of medications to treat HTN. These fall into several categories, and your caregiver will help you select the medicines that are best for you. Medications may have side effects. You should review side effects with your caregiver. If your blood pressure stays high after you have made lifestyle changes or started on medicines,   Your medication(s) may need to be changed.   Other problems may need to be addressed.   Be certain you understand your prescriptions, and know how and when to take your medicine.   Be sure to follow up with your caregiver within the time frame advised (usually within two weeks) to have your blood pressure rechecked  and to review your medications.   If you are taking more than one medicine to lower your blood pressure, make sure you know how and at what times they should be taken. Taking two medicines at the same time can result in blood pressure that is too low.  SEEK IMMEDIATE MEDICAL CARE IF:  You develop a severe headache, blurred or changing vision, or confusion.   You have unusual weakness or numbness, or a faint feeling.   You have severe chest or abdominal pain, vomiting, or breathing problems.  MAKE SURE YOU:   Understand these instructions.   Will watch your condition.   Will get help right away if you are not doing well or get worse.  Document Released: 03/03/2005 Document Revised: 02/20/2011 Document Reviewed: 10/22/2007 [facility] Patient Information 2012 [place], [place].

## 2011-06-25 ENCOUNTER — Encounter: Payer: Self-pay | Admitting: *Deleted

## 2011-07-17 ENCOUNTER — Other Ambulatory Visit: Payer: Self-pay | Admitting: Cardiology

## 2011-07-17 DIAGNOSIS — I6529 Occlusion and stenosis of unspecified carotid artery: Secondary | ICD-10-CM

## 2011-07-24 ENCOUNTER — Encounter: Payer: Self-pay | Admitting: Cardiology

## 2011-07-24 ENCOUNTER — Ambulatory Visit (INDEPENDENT_AMBULATORY_CARE_PROVIDER_SITE_OTHER): Payer: BC Managed Care – PPO | Admitting: Cardiology

## 2011-07-24 ENCOUNTER — Encounter (INDEPENDENT_AMBULATORY_CARE_PROVIDER_SITE_OTHER): Payer: BC Managed Care – PPO

## 2011-07-24 VITALS — BP 120/62 | HR 58 | Ht 73.0 in | Wt 169.0 lb

## 2011-07-24 DIAGNOSIS — I779 Disorder of arteries and arterioles, unspecified: Secondary | ICD-10-CM

## 2011-07-24 DIAGNOSIS — R002 Palpitations: Secondary | ICD-10-CM

## 2011-07-24 DIAGNOSIS — F17200 Nicotine dependence, unspecified, uncomplicated: Secondary | ICD-10-CM

## 2011-07-24 DIAGNOSIS — I6529 Occlusion and stenosis of unspecified carotid artery: Secondary | ICD-10-CM

## 2011-07-24 DIAGNOSIS — I2510 Atherosclerotic heart disease of native coronary artery without angina pectoris: Secondary | ICD-10-CM

## 2011-07-24 DIAGNOSIS — Z720 Tobacco use: Secondary | ICD-10-CM

## 2011-07-24 DIAGNOSIS — I4892 Unspecified atrial flutter: Secondary | ICD-10-CM

## 2011-07-24 DIAGNOSIS — I10 Essential (primary) hypertension: Secondary | ICD-10-CM

## 2011-07-24 NOTE — Assessment & Plan Note (Signed)
The patient had followup carotid Doppler today. He has had progression on the right and the left. Plan is for a followup in 6 months. I discussed this with him.

## 2011-07-24 NOTE — Assessment & Plan Note (Signed)
Coronary disease is stable. No change in therapy. 

## 2011-07-24 NOTE — Patient Instructions (Signed)
Your physician wants you to follow-up in:  6 months. You will receive a reminder letter in the mail two months in advance. If you don't receive a letter, please call our office to schedule the follow-up appointment.   

## 2011-07-24 NOTE — Assessment & Plan Note (Signed)
Blood pressure is controlled. No change in therapy. 

## 2011-07-24 NOTE — Assessment & Plan Note (Signed)
I have counseled him to stop smoking. He does say that he is cutting down.

## 2011-07-24 NOTE — Progress Notes (Signed)
HPI Patient is seen to followup coronary disease and atrial flutter. His coronary status has been stable. He has cut his smoking down but he has not stopped completely. He's not having any chest pain. He has very rare palpitations but no prolonged palpitations.  No Known Allergies  Current Outpatient Prescriptions  Medication Sig Dispense Refill  . aspirin 81 MG tablet Take 81 mg by mouth daily.        Marland Kitchen atorvastatin (LIPITOR) 40 MG tablet Take 1 tablet (40 mg total) by mouth daily.  90 tablet  3  . calcium carbonate (TUMS - DOSED IN MG ELEMENTAL CALCIUM) 500 MG chewable tablet Chew 1 tablet by mouth as needed.        Marland Kitchen lisinopril (PRINIVIL,ZESTRIL) 10 MG tablet Take 1 tablet (10 mg total) by mouth daily.  90 tablet  3  . Tamsulosin HCl (FLOMAX) 0.4 MG CAPS Take 1 capsule (0.4 mg total) by mouth daily after supper. 1 tab qd  90 capsule  3    History   Social History  . Marital Status: Married    Spouse Name: N/A    Number of Children: N/A  . 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 Smoking status: Current Everyday Smoker -- 0.8 packs/day for 40 years    Types: Cigarettes  . Smokeless tobacco: Never Used   Comment: 1 pack a day  . Alcohol Use: Yes     he continues to drink heavily despite my recommendation to quit  . Drug Use: Yes  . Sexually Active: Not on file   Other Topics Concern  . Not on file   Social History Narrative  . No narrative on file    Family History  Problem Relation Age of Onset  . Hypertension      Past Medical History  Diagnosis Date  . Adenomatous colon polyp   . Palpitations     probable SVT, rate 170,, at home,, lasted one hour,, 2012  . Hypercholesterolemia   . GERD (gastroesophageal reflux disease)   . Alcohol use   . Hypertension   . Raynaud's syndrome   . TIA (transient ischemic attack)     Possible small vessel tia in the past.  . CRAO (central retinal artery occlusion)     . BPH (benign prostatic hypertrophy)   . Carotid artery disease     0-39% R. ICA, 40-59% LICA... Doppler.... January, 2010  /  Doppler... in January, 2011 no change  /  doppler1/27/2012...stable  0-39% RICA 40-59% LICA  . Tobacco abuse   . Antiphospholipid antibody positive     ???In the past???but not proven according to patient  . Thyroid cyst     Removed in the past  . Ejection fraction      EF 50-55%, echo, March, 2012... inferobasal and distal septal hypokinesis /      60%, echo, November, 2008  . CAD (coronary artery disease)     Catheterization 2005 disease / catheterization July 06, 2010, chronic total occlusion distal RCA with collaterals from right and left side.  Medical therapy recommended, mild decreased LV function       . Antiphospholipid antibody positive     ??? in the past, but not proven according to the patient.  . Atrial flutter     atrial flutter ablation,   Dr.Allred October 18, 2010  . Myocardial infarction     Past Surgical History  Procedure  Date  . Cardiac catheterization 06/27/2003    2005.... mild irregularity of the LAD..( patient had had abnormal Myoview scan)  . Inguinal hernia repair     right    ROS Patient denies fever, chills, headache, sweats, rash, change in vision, change in hearing, chest pain, cough, nausea vomiting, urinary symptoms. All other systems are reviewed and are negative.  PHYSICAL EXAM  Patient is oriented to person time and place. Affect is normal. He is stable. He's here alone today. He has carotid bruits that are known. There is no jugular venous distention. Lungs are clear. Respiratory effort is not labored. Cardiac exam reveals S1 and S2. There no clicks or significant murmurs. The abdomen is soft. There is no peripheral edema.This  Filed Vitals:   07/24/11 1430  BP: 120/62  Pulse: 58  Height: 6\' 1"  (1.854 m)  Weight: 169 lb (76.658 kg)   EKG is done today and reviewed by me. There has sinus rhythm. He has increased  voltage. His ST changes are slightly more marked than usual but there is no diagnostic change.  ASSESSMENT & PLAN

## 2011-07-24 NOTE — Assessment & Plan Note (Signed)
He's not having any significant palpitations. His flutter ablation has been very successful. No change in therapy.

## 2011-08-04 ENCOUNTER — Other Ambulatory Visit: Payer: Self-pay | Admitting: *Deleted

## 2011-08-04 DIAGNOSIS — Z7189 Other specified counseling: Secondary | ICD-10-CM

## 2011-08-04 DIAGNOSIS — E789 Disorder of lipoprotein metabolism, unspecified: Secondary | ICD-10-CM

## 2011-08-04 DIAGNOSIS — I10 Essential (primary) hypertension: Secondary | ICD-10-CM

## 2011-09-08 ENCOUNTER — Other Ambulatory Visit: Payer: Self-pay | Admitting: Family Medicine

## 2011-09-08 DIAGNOSIS — I10 Essential (primary) hypertension: Secondary | ICD-10-CM

## 2011-09-08 DIAGNOSIS — Z7189 Other specified counseling: Secondary | ICD-10-CM

## 2011-09-08 DIAGNOSIS — E789 Disorder of lipoprotein metabolism, unspecified: Secondary | ICD-10-CM

## 2011-09-08 MED ORDER — ATORVASTATIN CALCIUM 40 MG PO TABS: 40.0000 mg | ORAL_TABLET | Freq: Every day | ORAL | Status: DC

## 2011-10-02 ENCOUNTER — Encounter: Payer: Self-pay | Admitting: Family Medicine

## 2011-10-02 ENCOUNTER — Ambulatory Visit (INDEPENDENT_AMBULATORY_CARE_PROVIDER_SITE_OTHER): Payer: BC Managed Care – PPO | Admitting: Family Medicine

## 2011-10-02 VITALS — BP 120/67 | HR 77 | Temp 96.8°F | Resp 16 | Ht 72.5 in | Wt 158.4 lb

## 2011-10-02 DIAGNOSIS — E785 Hyperlipidemia, unspecified: Secondary | ICD-10-CM

## 2011-10-02 DIAGNOSIS — F17200 Nicotine dependence, unspecified, uncomplicated: Secondary | ICD-10-CM

## 2011-10-02 DIAGNOSIS — I739 Peripheral vascular disease, unspecified: Secondary | ICD-10-CM

## 2011-10-02 DIAGNOSIS — I10 Essential (primary) hypertension: Secondary | ICD-10-CM

## 2011-10-02 DIAGNOSIS — Z720 Tobacco use: Secondary | ICD-10-CM

## 2011-10-02 DIAGNOSIS — M79609 Pain in unspecified limb: Secondary | ICD-10-CM

## 2011-10-03 ENCOUNTER — Encounter: Payer: Self-pay | Admitting: Family Medicine

## 2011-10-03 LAB — COMPREHENSIVE METABOLIC PANEL
ALT: 17 U/L (ref 0–53)
Albumin: 5.1 g/dL (ref 3.5–5.2)
CO2: 24 mEq/L (ref 19–32)
Calcium: 10.3 mg/dL (ref 8.4–10.5)
Chloride: 101 mEq/L (ref 96–112)
Creat: 1.18 mg/dL (ref 0.50–1.35)
Potassium: 4.5 mEq/L (ref 3.5–5.3)
Total Protein: 7.2 g/dL (ref 6.0–8.3)

## 2011-10-03 LAB — LIPID PANEL: HDL: 70 mg/dL (ref >39)

## 2011-10-03 LAB — VITAMIN D 25 HYDROXY (VIT D DEFICIENCY, FRACTURES): Vit D, 25-Hydroxy: 59 ng/mL (ref 30–89)

## 2011-10-05 ENCOUNTER — Encounter: Payer: Self-pay | Admitting: Family Medicine

## 2011-10-05 NOTE — Progress Notes (Signed)
Subjective:    Patient ID: [name], male    DOB: 01-Jun-1950, 61 y.o.   MRN: [id number]  HPI  This 61 y.o. Cauc male had significant CAD and atrial flutter and is an everyday smoker. He was last   seen by Dr. Lovena Neighbours in May 2013 and is scheduled to return for follow-up in 6 months. He complains of  bilateral lower extremity weakness and lightheadedness. He has Raynaud's syndrome in his legs only  and reports coolness and discoloration in legs. He works as a Soil scientist and denies claudication   when walking more than a distance equivalent to 1/2 -1 [place] Cross. He denies muscle cramping but states  his legs do "feel funny sometimes". He denies neck or low back pain.    Review of Systems  Respiratory: Positive for cough. Negative for chest tightness, shortness of breath and wheezing.   Cardiovascular: Negative for chest pain, palpitations and leg swelling.  Gastrointestinal: Negative for abdominal pain.  Musculoskeletal: Positive for gait problem. Negative for myalgias, back pain, joint swelling and arthralgias.  Skin: Positive for color change.  Neurological: Positive for weakness and light-headedness. Negative for dizziness, syncope, numbness and headaches.  Hematological: Negative.   Psychiatric/Behavioral: Negative.        Objective:   Physical Exam  Nursing note and vitals reviewed. Constitutional: He is oriented to person, place, and time. He appears well-developed and well-nourished. No distress.  HENT:  Head: Normocephalic and atraumatic.  Mouth/Throat: Oropharynx is clear and moist.  Eyes: Conjunctivae and EOM are normal. No scleral icterus.  Neck: Neck supple. No JVD present. No thyromegaly present.  Cardiovascular: Normal rate and normal heart sounds.   No murmur heard.      Pulses in lower ext: Femoral 2+                                  Popliteal- difficult to palpate                                  DP- 0-1+                                  TD- 0-1+    Pulmonary/Chest: Effort normal and breath sounds normal. No respiratory distress. He has no wheezes.  Abdominal: Soft. He exhibits no mass. There is no tenderness. There is no guarding.  Musculoskeletal: He exhibits no edema and no tenderness.       Joint stiffness in major joints; mild deformity c/w DJD. Lower ext: hypersensitivity to light touch noted over distal 1/2 of both legs  Lymphadenopathy:    He has no cervical adenopathy.  Neurological: He is alert and oriented to person, place, and time. He has normal reflexes. No cranial nerve deficit. He exhibits normal muscle tone. Coordination normal.  Skin: Skin is warm and dry.       Lower ext: distal 1/2 of legs are cool to touch and discolored (pale). Capillary refill abnormal in digits which were slightly cyanotic with pt supine on exam table  Psychiatric: He has a normal mood and affect. His behavior is normal. Judgment and thought content normal.          Assessment & Plan:   1. Limb pain  Vitamin D, 25-hydroxy, Lower Extremity Arterial Duplex  Bilateral  2. Hyperlipidemia  Lipid panel (nonfasting)  3. HTN (hypertension)  Comprehensive metabolic panel, Vitamin D, 25-hydroxy  4. PAD (peripheral artery disease) - pt is at increased risk given tobacco use and known CAD Lower Extremity Arterial Duplex Bilateral  5. Tobacco abuse - CXR shows COPD in past Pt repeatedly advised about tobacco cessation in past

## 2011-10-07 NOTE — Progress Notes (Signed)
Quick Note:  Please call pt and advise that the following labs are abnormal... All lab tests are normal but blood sugar is a little elevate. Limit sweets and sodas/ sweet tea, fried foods and lots of starches (breads, rice,potatoes, pasta). Increase vegetables, some fruits and water intake.   Copy to pt. ______

## 2011-10-14 ENCOUNTER — Encounter (INDEPENDENT_AMBULATORY_CARE_PROVIDER_SITE_OTHER): Payer: BC Managed Care – PPO

## 2011-10-14 DIAGNOSIS — I739 Peripheral vascular disease, unspecified: Secondary | ICD-10-CM

## 2011-10-14 DIAGNOSIS — M79609 Pain in unspecified limb: Secondary | ICD-10-CM

## 2011-10-17 NOTE — Progress Notes (Signed)
Quick Note:  Your recent imaging study is normal. The recent test that was done to study the blood flow in the arteries in your legs is normal. ______

## 2011-12-13 ENCOUNTER — Ambulatory Visit (INDEPENDENT_AMBULATORY_CARE_PROVIDER_SITE_OTHER): Payer: BC Managed Care – PPO | Admitting: Family Medicine

## 2011-12-13 VITALS — BP 172/73 | HR 86 | Temp 98.4°F | Resp 16 | Ht 72.25 in | Wt 156.6 lb

## 2011-12-13 DIAGNOSIS — J45909 Unspecified asthma, uncomplicated: Secondary | ICD-10-CM

## 2011-12-13 DIAGNOSIS — J449 Chronic obstructive pulmonary disease, unspecified: Secondary | ICD-10-CM

## 2011-12-13 DIAGNOSIS — K4090 Unilateral inguinal hernia, without obstruction or gangrene, not specified as recurrent: Secondary | ICD-10-CM

## 2011-12-13 DIAGNOSIS — J411 Mucopurulent chronic bronchitis: Secondary | ICD-10-CM

## 2011-12-13 DIAGNOSIS — Z720 Tobacco use: Secondary | ICD-10-CM

## 2011-12-13 MED ORDER — PREDNISONE 20 MG PO TABS: ORAL_TABLET | ORAL | Status: DC

## 2011-12-13 MED ORDER — HYDROCODONE-HOMATROPINE 5-1.5 MG/5ML PO SYRP: 5.0000 mL | ORAL_SOLUTION | Freq: Three times a day (TID) | ORAL | Status: DC | PRN

## 2011-12-13 MED ORDER — AZITHROMYCIN 250 MG PO TABS: ORAL_TABLET | ORAL | Status: DC

## 2011-12-13 MED ORDER — ALBUTEROL SULFATE (2.5 MG/3ML) 0.083% IN NEBU
2.5000 mg | INHALATION_SOLUTION | Freq: Once | RESPIRATORY_TRACT | Status: AC
  Administered 2011-12-13: 2.5 mg via RESPIRATORY_TRACT

## 2011-12-13 NOTE — Progress Notes (Signed)
Subjective: Cough for 1 week.  Smoker.  Nonproctivve.  Wife also has one.    Hx of L inguinal hernia bulging more with cough. Had one on right a few years ago  Objective HEENt normal Chest CTA Heart RRR Moderate left inguinal hernia, indirect?  Poor air exchange  Peak flow 375  Estimated 560 Assessment Asthmatic bronchitis Smoker L inguinal hernia COPD  Plan HHN Refer to surgery  Peak flow still 370. Did not improve.  COPD

## 2011-12-13 NOTE — Patient Instructions (Signed)
Fluids, rest  Seriously consider quitting smoking  See Dr. Carolynne Edouard for evaluation

## 2011-12-15 ENCOUNTER — Other Ambulatory Visit: Payer: Self-pay | Admitting: *Deleted

## 2011-12-15 ENCOUNTER — Telehealth: Payer: Self-pay | Admitting: Cardiology

## 2011-12-15 NOTE — Telephone Encounter (Signed)
Patient normally has a carotid artery exam with 6 mo f/u plz return call to let him know if he should have exam with his next appnt 12/3.  He can be reached on mobile #

## 2011-12-15 NOTE — Telephone Encounter (Signed)
Carotids scheduled for just prior to December visit.

## 2011-12-29 ENCOUNTER — Ambulatory Visit (INDEPENDENT_AMBULATORY_CARE_PROVIDER_SITE_OTHER): Payer: BC Managed Care – PPO | Admitting: Surgery

## 2012-01-05 ENCOUNTER — Encounter (INDEPENDENT_AMBULATORY_CARE_PROVIDER_SITE_OTHER): Payer: Self-pay | Admitting: Surgery

## 2012-01-05 ENCOUNTER — Ambulatory Visit (INDEPENDENT_AMBULATORY_CARE_PROVIDER_SITE_OTHER): Payer: BC Managed Care – PPO | Admitting: Surgery

## 2012-01-05 VITALS — BP 130/82 | HR 64 | Temp 99.7°F | Resp 14 | Ht 74.0 in | Wt 159.0 lb

## 2012-01-05 DIAGNOSIS — K4090 Unilateral inguinal hernia, without obstruction or gangrene, not specified as recurrent: Secondary | ICD-10-CM

## 2012-01-05 NOTE — Patient Instructions (Signed)
See the Handout(s) we gave you.  Consider surgery.  Will need clearance from her cardiologist first.  Please call our office at [phone] if you wish to schedule surgery or if you have further questions / concerns.   Hernia A hernia occurs when an internal organ pushes out through a weak spot in the abdominal wall. Hernias most commonly occur in the groin and around the navel. Hernias often can be pushed back into place (reduced). Most hernias tend to get worse over time. Some abdominal hernias can get stuck in the opening (irreducible or incarcerated hernia) and cannot be reduced. An irreducible abdominal hernia which is tightly squeezed into the opening is at risk for impaired blood supply (strangulated hernia). A strangulated hernia is a medical emergency. Because of the risk for an irreducible or strangulated hernia, surgery may be recommended to repair a hernia. CAUSES   Heavy lifting.  Prolonged coughing.  Straining to have a bowel movement.  A cut (incision) made during an abdominal surgery. HOME CARE INSTRUCTIONS   Bed rest is not required. You may continue your normal activities.  Avoid lifting more than 10 pounds (4.5 kg) or straining.  Cough gently. If you are a smoker it is best to stop. Even the best hernia repair can break down with the continual strain of coughing. Even if you do not have your hernia repaired, a cough will continue to aggravate the problem.  Do not wear anything tight over your hernia. Do not try to keep it in with an outside bandage or truss. These can damage abdominal contents if they are trapped within the hernia sac.  Eat a normal diet.  Avoid constipation. Straining over long periods of time will increase hernia size and encourage breakdown of repairs. If you cannot do this with diet alone, stool softeners may be used. SEEK IMMEDIATE MEDICAL CARE IF:   You have a fever.  You develop increasing abdominal pain.  You feel nauseous or  vomit.  Your hernia is stuck outside the abdomen, looks discolored, feels hard, or is tender.  You have any changes in your bowel habits or in the hernia that are unusual for you.  You have increased pain or swelling around the hernia.  You cannot push the hernia back in place by applying gentle pressure while lying down. MAKE SURE YOU:   Understand these instructions.  Will watch your condition.  Will get help right away if you are not doing well or get worse. Document Released: 03/03/2005 Document Revised: 05/26/2011 Document Reviewed: 10/21/2007 [facility] Patient Information 2013 [place], [place].

## 2012-01-05 NOTE — Progress Notes (Signed)
Subjective:     Patient ID: [name], male   DOB: 03-31-1950, 61 y.o.   MRN: [id number]  HPI  [name]  [birth year] [id number]  Patient Care Team: Lamar Laundry, MD as PCP - General (Family Medicine) Lajuana Carry as Physician Assistant (Neurology) Luis Abed, MD as Consulting Physician (Cardiology)  This patient is a 61 y.o.male who presents today for surgical evaluation at the request of Dr. Alwyn Cross.   Reason for evaluation: Worsening left groin pain and now involves.  Probable hernia.  Pleasant smoking active male.  Right inguinal hernia repair a few years ago in an open fashion by my partner, Dr. Carolynne Edouard.  Recovered from that well.  Felt some left groin pain.  Cannot determine if she truly had a hernia.  It was recommended he would followup to see if one declared itself.  Lost to followup.  He has noticed a bulge more obviously now.  Had a recent episode of bad bronchitis with a lot of coughing.  That made the groin pain and bulge much more obvious and more intense.   It should do to his primary care physician whom noted a more obvious hernia.  Sent to [place] to reconsider repair.  Has a bowel movement daily.  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  No history of infection.  Trying to cut back on smoking but still active.  Patient Active Problem List  Diagnosis  . ADENOMATOUS COLONIC POLYP  . THYROID CYST  . HYPERCHOLESTEROLEMIA  . ALCOHOL USE  . TOBACCO ABUSE  . CENTRAL RETINAL ARTERY OCCLUSION  . HYPERTENSION  . GERD  . INGUINAL HERNIA, LEFT  . BENIGN PROSTATIC HYPERTROPHY  . PERSONAL HISTORY OF COLONIC POLYPS  . ABNORMAL ELECTROCARDIOGRAM  . Hypertension  . Raynaud's syndrome  . TIA (transient ischemic attack)  . CRAO (central retinal artery occlusion)  . Carotid artery disease  . Ejection fraction  . CAD (coronary artery disease)  . Palpitations  . SVT (supraventricular tachycardia)  . Atrial flutter  . Encounter for long-term (current) use of  anticoagulants  . Atrial flutter    Past Medical History  Diagnosis Date  . Adenomatous colon polyp   . Palpitations     probable SVT, rate 170,, at home,, lasted one hour,, 2012  . Hypercholesterolemia   . GERD (gastroesophageal reflux disease)   . Alcohol use   . Hypertension   . Raynaud's syndrome   . TIA (transient ischemic attack)     Possible small vessel tia in the past.  . CRAO (central retinal artery occlusion)   . BPH (benign prostatic hypertrophy)   . Carotid artery disease     0-39% R. ICA, 40-59% LICA... Doppler.... January, 2010  /  Doppler... in January, 2011 no change  /  doppler1/27/2012...stable  0-39% RICA 40-59% LICA  . Tobacco abuse   . Antiphospholipid antibody positive     ???In the past???but not proven according to patient  . Thyroid cyst     Removed in the past  . Ejection fraction      EF 50-55%, echo, March, 2012... inferobasal and distal septal hypokinesis /      60%, echo, November, 2008  . CAD (coronary artery disease)     Catheterization 2005 disease / catheterization July 06, 2010, chronic total occlusion distal RCA with collaterals from right and left side.  Medical therapy recommended, mild decreased LV function       . Antiphospholipid antibody positive     ???  in the past, but not proven according to the patient.  . Atrial flutter     atrial flutter ablation,   Dr.Allred October 18, 2010  . Myocardial infarction     Past Surgical History  Procedure Date  . Cardiac catheterization 06/27/2003    2005.... mild irregularity of the LAD..( patient had had abnormal Myoview scan)  . Inguinal hernia repair 2009    right with mesh.  Dr Carolynne Edouard  . Thyroid surgery 2006    History   Social History  . Marital Status: Married    Spouse Name: N/A    Number of Children: N/A  . 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 Smoking status: Current Every Day Smoker -- 0.5 packs/day for 40 years     Types: Cigarettes  . Smokeless tobacco: Never Used   Comment: 1 pack a day  . Alcohol Use: 12.6 oz/week    21 Cans of beer per week     he continues to drink heavily despite my recommendation to quit  . Drug Use: No  . Sexually Active: Not on file   Other Topics Concern  . Not on file   Social History Narrative  . No narrative on file    No family history on file.  Current Outpatient Prescriptions  Medication Sig Dispense Refill  . aspirin 81 MG tablet Take 81 mg by mouth daily.        Marland Kitchen atorvastatin (LIPITOR) 40 MG tablet Take 1 tablet (40 mg total) by mouth daily. Needs office visit/labs for refills  30 tablet  0  . azithromycin (ZITHROMAX) 250 MG tablet Take 2 initiallly, then one daily for 4 days  6 tablet  0  . calcium carbonate (TUMS - DOSED IN MG ELEMENTAL CALCIUM) 500 MG chewable tablet Chew 1 tablet by mouth as needed.        Marland Kitchen lisinopril (PRINIVIL,ZESTRIL) 10 MG tablet Take 1 tablet (10 mg total) by mouth daily.  90 tablet  3  . Tamsulosin HCl (FLOMAX) 0.4 MG CAPS Take 1 capsule (0.4 mg total) by mouth daily after supper. 1 tab qd  90 capsule  3     Allergies  Allergen Reactions  . Cialis (Tadalafil)   . Levitra (Vardenafil)   . Viagra (Sildenafil Citrate)     BP 130/82  Pulse 64  Temp 99.7 F (37.6 C) (Temporal)  Resp 14  Ht 6\' 2"  (1.88 m)  Wt 159 lb (72.122 kg)  BMI 20.41 kg/m2  No results found.   Review of Systems  Constitutional: Negative for fever, chills and diaphoresis.  HENT: Negative for nosebleeds, sore throat, facial swelling, mouth sores, trouble swallowing and ear discharge.   Eyes: Negative for photophobia, discharge and visual disturbance.  Respiratory: Negative for choking, chest tightness, shortness of breath and stridor.   Cardiovascular: Negative for chest pain and palpitations.  Gastrointestinal: Negative for nausea, vomiting, abdominal pain, diarrhea, constipation, blood in stool, abdominal distention, anal bleeding and rectal pain.   Genitourinary: Negative for dysuria, urgency, difficulty urinating and testicular pain.  Musculoskeletal: Negative for myalgias, back pain, arthralgias and gait problem.  Skin: Negative for color change, pallor, rash and wound.  Neurological: Negative for dizziness, speech difficulty, weakness, numbness and headaches.  Hematological: Negative for adenopathy. Does not bruise/bleed easily.  Psychiatric/Behavioral: Negative for hallucinations, confusion and agitation.       Objective:   Physical Exam  Constitutional: He is oriented  to person, place, and time. He appears well-developed and well-nourished. No distress.  HENT:  Head: Normocephalic.  Mouth/Throat: Oropharynx is clear and moist. No oropharyngeal exudate.  Eyes: Conjunctivae normal and EOM are normal. Pupils are equal, round, and reactive to light. No scleral icterus.  Neck: Normal range of motion. Neck supple. No tracheal deviation present.  Cardiovascular: Normal rate, regular rhythm and intact distal pulses.   Pulmonary/Chest: Effort normal and breath sounds normal. No respiratory distress.  Abdominal: Soft. Bowel sounds are normal. He exhibits no distension, no abdominal bruit and no mass. There is no tenderness. There is no rigidity, no rebound, no guarding and no CVA tenderness. A hernia is present. Hernia confirmed positive in the left inguinal area. Hernia confirmed negative in the ventral area and confirmed negative in the right inguinal area.    Musculoskeletal: Normal range of motion. He exhibits no tenderness.  Lymphadenopathy:    He has no cervical adenopathy.       Right: No inguinal adenopathy present.       Left: No inguinal adenopathy present.  Neurological: He is alert and oriented to person, place, and time. No cranial nerve deficit. He exhibits normal muscle tone. Coordination normal.  Skin: Skin is warm and dry. No rash noted. He is not diaphoretic. No erythema. No pallor.  Psychiatric: He has a normal mood  and affect. His behavior is normal. Judgment and thought content normal.       Assessment:     Symptomatic left groin pain with obvious hernia now.  Rather active.  Would benefit from repair.    Plan:     Reasonable to approach laparoscopically.  I discussed with him:  The anatomy & physiology of the abdominal wall and pelvic floor was discussed.  The pathophysiology of hernias in the inguinal and pelvic region was discussed.  Natural history risks such as progressive enlargement, pain, incarceration & strangulation was discussed.   Contributors to complications such as smoking, obesity, diabetes, prior surgery, etc were discussed.    I feel the risks of no intervention will lead to serious problems that outweigh the operative risks; therefore, I recommended surgery to reduce and repair the hernia.  I explained laparoscopic techniques with possible need for an open approach.  I noted usual use of mesh to patch and/or buttress hernia repair  Risks such as bleeding, infection, abscess, need for further treatment, heart attack, death, and other risks were discussed.  I noted a good likelihood this will help address the problem.   Goals of post-operative recovery were discussed as well.  Possibility that this will not correct all symptoms was explained.  I stressed the importance of low-impact activity, aggressive pain control, avoiding constipation, & not pushing through pain to minimize risk of post-operative chronic pain or injury. Possibility of reherniation was discussed.  We will work to minimize complications.     An educational handout further explaining the pathology & treatment options was given as well.  Questions were answered.  The patient expresses understanding & wishes to proceed with surgery.  We talked to the patient about the dangers of smoking.  We stressed that tobacco use dramatically increases the risk of peri-operative complications such as infection, tissue necrosis leaving  to problems with incision/wound and organ healing, heart attack, stroke, DVT, pulmonary embolism, and death.  We noted there are programs in our community to help stop smoking.

## 2012-01-26 ENCOUNTER — Encounter (INDEPENDENT_AMBULATORY_CARE_PROVIDER_SITE_OTHER): Payer: Self-pay

## 2012-01-26 ENCOUNTER — Other Ambulatory Visit (INDEPENDENT_AMBULATORY_CARE_PROVIDER_SITE_OTHER): Payer: Self-pay | Admitting: Surgery

## 2012-02-17 ENCOUNTER — Ambulatory Visit: Payer: BC Managed Care – PPO | Admitting: Cardiology

## 2012-02-17 ENCOUNTER — Ambulatory Visit (INDEPENDENT_AMBULATORY_CARE_PROVIDER_SITE_OTHER): Payer: 59 | Admitting: Cardiology

## 2012-02-17 ENCOUNTER — Encounter: Payer: Self-pay | Admitting: Cardiology

## 2012-02-17 ENCOUNTER — Encounter (INDEPENDENT_AMBULATORY_CARE_PROVIDER_SITE_OTHER): Payer: 59

## 2012-02-17 VITALS — BP 118/70 | HR 65 | Ht 74.0 in | Wt 162.0 lb

## 2012-02-17 DIAGNOSIS — I4892 Unspecified atrial flutter: Secondary | ICD-10-CM

## 2012-02-17 DIAGNOSIS — I7300 Raynaud's syndrome without gangrene: Secondary | ICD-10-CM

## 2012-02-17 DIAGNOSIS — I6529 Occlusion and stenosis of unspecified carotid artery: Secondary | ICD-10-CM

## 2012-02-17 DIAGNOSIS — I779 Disorder of arteries and arterioles, unspecified: Secondary | ICD-10-CM

## 2012-02-17 DIAGNOSIS — F17200 Nicotine dependence, unspecified, uncomplicated: Secondary | ICD-10-CM

## 2012-02-17 DIAGNOSIS — Z01810 Encounter for preprocedural cardiovascular examination: Secondary | ICD-10-CM

## 2012-02-17 DIAGNOSIS — I10 Essential (primary) hypertension: Secondary | ICD-10-CM

## 2012-02-17 NOTE — Progress Notes (Signed)
HPI   The patient is seen today for consultation for a preop cardiac clearance for inguinal hernia repair. The patient has known coronary disease. He has peripheral vascular disease. He's had supraventricular arrhythmias. Fortunately these issues are all under control. He's not having any significant chest pain. He has rare palpitations. His exercise level is good. He has not had any congestive heart failure. He has normal left ventricular function.  Allergies  Allergen Reactions  . Cialis (Tadalafil)   . Levitra (Vardenafil)   . Viagra (Sildenafil Citrate)     Current Outpatient Prescriptions  Medication Sig Dispense Refill  . aspirin 81 MG tablet Take 81 mg by mouth daily.        Marland Kitchen atorvastatin (LIPITOR) 40 MG tablet Take 1 tablet (40 mg total) by mouth daily. Needs office visit/labs for refills  30 tablet  0  . calcium carbonate (TUMS - DOSED IN MG ELEMENTAL CALCIUM) 500 MG chewable tablet Chew 1 tablet by mouth as needed.        Marland Kitchen lisinopril (PRINIVIL,ZESTRIL) 10 MG tablet Take 1 tablet (10 mg total) by mouth daily.  90 tablet  3  . Tamsulosin HCl (FLOMAX) 0.4 MG CAPS Take 1 capsule (0.4 mg total) by mouth daily after supper. 1 tab qd  90 capsule  3    History   Social History  . Marital Status: Married    Spouse Name: N/A    Number of Children: N/A  . 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 Smoking status: Current Every Day Smoker -- 0.5 packs/day for 40 years    Types: Cigarettes  . Smokeless tobacco: Never Used     Comment: 1 pack a day  . Alcohol Use: 12.6 oz/week    21 Cans of beer per week     Comment: he continues to drink heavily despite my recommendation to quit  . Drug Use: No  . Sexually Active: Not on file   Other Topics Concern  . Not on file   Social History Narrative  . No narrative on file    No family history on file.  Past Medical History  Diagnosis Date  . Adenomatous colon polyp     . Palpitations     probable SVT, rate 170,, at home,, lasted one hour,, 2012  . Hypercholesterolemia   . GERD (gastroesophageal reflux disease)   . Alcohol use   . Hypertension   . Raynaud's syndrome   . TIA (transient ischemic attack)     Possible small vessel tia in the past.  . CRAO (central retinal artery occlusion)   . BPH (benign prostatic hypertrophy)   . Carotid artery disease     0-39% R. ICA, 40-59% LICA... Doppler.... January, 2010  /  Doppler... in January, 2011 no change  /  doppler1/27/2012...stable  0-39% RICA 40-59% LICA  . Tobacco abuse   . Antiphospholipid antibody positive     ???In the past???but not proven according to patient  . Thyroid cyst     Removed in the past  . Ejection fraction      EF 50-55%, echo, March, 2012... inferobasal and distal septal hypokinesis /      60%, echo, November, 2008  . CAD (coronary artery disease)     Catheterization 2005 disease / catheterization July 06, 2010, chronic total occlusion distal RCA with collaterals from right and left side.  Medical therapy recommended, mild decreased LV  function       . Antiphospholipid antibody positive     ??? in the past, but not proven according to the patient.  . Atrial flutter     atrial flutter ablation,   Dr.Allred October 18, 2010  . Myocardial infarction     Past Surgical History  Procedure Date  . Cardiac catheterization 06/27/2003    2005.... mild irregularity of the LAD..( patient had had abnormal Myoview scan)  . Inguinal hernia repair 2009    right with mesh.  Dr Carolynne Edouard  . Thyroglossal duct cyst 2006    Dr. Annalee Genta    Patient Active Problem List  Diagnosis  . ADENOMATOUS COLONIC POLYP  . THYROID CYST  . HYPERCHOLESTEROLEMIA  . ALCOHOL USE  . TOBACCO ABUSE  . CENTRAL RETINAL ARTERY OCCLUSION  . HYPERTENSION  . GERD  . INGUINAL HERNIA, LEFT  . BENIGN PROSTATIC HYPERTROPHY  . PERSONAL HISTORY OF COLONIC POLYPS  . ABNORMAL ELECTROCARDIOGRAM  . Hypertension  .  Raynaud's syndrome  . TIA (transient ischemic attack)  . CRAO (central retinal artery occlusion)  . Carotid artery disease  . Ejection fraction  . CAD (coronary artery disease)  . Palpitations  . SVT (supraventricular tachycardia)  . Atrial flutter  . Encounter for long-term (current) use of anticoagulants  . Atrial flutter  . Preop cardiovascular exam    ROS   Patient denies fever, chills, headache, sweats, rash, change in vision, change in hearing, chest pain, cough, nausea vomiting, urinary symptoms. All other systems are reviewed and are negative.  PHYSICAL EXAM  The patient is oriented to person time and place. Affect is normal. There is no jugulovenous distention. He does have carotid bruits. Lungs are clear. Respiratory effort is nonlabored. Cardiac exam reveals S1 and S2. There no clicks or significant murmurs. The abdomen is soft. There is no peripheral edema. There no musculoskeletal deformities. There are no skin rashes. He does have a reddish tint to his skin that he has had as long as I've known him.  Filed Vitals:   02/17/12 1514  BP: 118/70  Pulse: 65  Height: 6\' 2"  (1.88 m)  Weight: 162 lb (73.483 kg)   EKG is done today and reviewed by me. There is normal sinus rhythm. The EKG is normal. There is no significant change.  ASSESSMENT & PLAN

## 2012-02-17 NOTE — Assessment & Plan Note (Signed)
The patient's overall cardiac status is stable. His coronary disease is stable. His supraventricular arrhythmias are stable. He has not had a recent MI. He has no signs of heart failure. He can exercise greater than 4 mets. There is no reason to proceed with any other type of exercise testing at this time.  He is cleared to have inguinal hernia repair surgically.

## 2012-02-17 NOTE — Assessment & Plan Note (Signed)
Blood pressures control. No change in therapy. 

## 2012-02-17 NOTE — Patient Instructions (Addendum)
Your physician wants you to follow-up in: 6 months.   You will receive a reminder letter in the mail two months in advance. If you don't receive a letter, please call our office to schedule the follow-up appointment.  Your physician recommends that you continue on your current medications as directed. Please refer to the Current Medication list given to you today.  You have been cleared for surgery from a cardiac standpoint by Dr Myrtis Ser

## 2012-02-17 NOTE — Assessment & Plan Note (Signed)
The patient underwent atrial flutter ablation in August, 2012. He's had a good result. He has had no prolonged palpitations. No further workup.

## 2012-02-17 NOTE — Assessment & Plan Note (Signed)
The patient has Raynaud's phenomenon. No further workup.

## 2012-02-17 NOTE — Assessment & Plan Note (Signed)
The patient has known carotid artery disease. He had a Doppler study today. His carotid disease is stable. There is 40-59% R. ICA and 60-79% LICA. We will be following this over time.

## 2012-02-17 NOTE — Assessment & Plan Note (Signed)
The patient continues to smoke a small amount. I have again counseled him to stop.

## 2012-02-25 ENCOUNTER — Other Ambulatory Visit (INDEPENDENT_AMBULATORY_CARE_PROVIDER_SITE_OTHER): Payer: Self-pay | Admitting: Surgery

## 2012-03-26 DIAGNOSIS — K4090 Unilateral inguinal hernia, without obstruction or gangrene, not specified as recurrent: Secondary | ICD-10-CM

## 2012-03-30 ENCOUNTER — Telehealth (INDEPENDENT_AMBULATORY_CARE_PROVIDER_SITE_OTHER): Payer: Self-pay

## 2012-03-30 NOTE — Telephone Encounter (Signed)
I called the pt to check on him postop and offer a follow up appointment.  I had to leave a message to call me.

## 2012-04-19 ENCOUNTER — Ambulatory Visit (INDEPENDENT_AMBULATORY_CARE_PROVIDER_SITE_OTHER): Payer: 59 | Admitting: Surgery

## 2012-04-19 ENCOUNTER — Encounter (INDEPENDENT_AMBULATORY_CARE_PROVIDER_SITE_OTHER): Payer: Self-pay | Admitting: Surgery

## 2012-04-19 VITALS — BP 130/72 | HR 64 | Temp 98.2°F | Resp 16 | Ht 74.0 in | Wt 166.4 lb

## 2012-04-19 DIAGNOSIS — F17200 Nicotine dependence, unspecified, uncomplicated: Secondary | ICD-10-CM

## 2012-04-19 DIAGNOSIS — K4090 Unilateral inguinal hernia, without obstruction or gangrene, not specified as recurrent: Secondary | ICD-10-CM

## 2012-04-19 NOTE — Progress Notes (Signed)
Subjective:     Patient ID: [name], male   DOB: [birth year], 62 y.o.   MRN: [id number]  HPI  [name]  Dec 14, 1950 [id number]  Patient Care Team: Lamar Laundry, MD as PCP - General (Family Medicine) Lajuana Carry as Physician Assistant (Neurology) Luis Abed, MD as Consulting Physician (Cardiology) Ardeth Sportsman, MD as Attending Physician (General Surgery)  This patient is a 62 y.o.male who presents today for surgical evaluation.   Reason for visit:  Postop followup from laparoscopic left inguinal hernia repair 26 March 2012  The patient comes in today feeling well.  Weaned off pain medications rapidly.  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  No fevers or chills.  Urinating fine.  No constipation.  Energy level is good.  Continues to smoke but trying to wean off.  Patient Active Problem List  Diagnosis  . ADENOMATOUS COLONIC POLYP  . THYROID CYST  . HYPERCHOLESTEROLEMIA  . ALCOHOL USE  . TOBACCO ABUSE  . CENTRAL RETINAL ARTERY OCCLUSION  . HYPERTENSION  . GERD  . INGUINAL HERNIA, LEFT  . BENIGN PROSTATIC HYPERTROPHY  . PERSONAL HISTORY OF COLONIC POLYPS  . ABNORMAL ELECTROCARDIOGRAM  . Hypertension  . Raynaud's syndrome  . TIA (transient ischemic attack)  . CRAO (central retinal artery occlusion)  . Carotid artery disease  . Ejection fraction  . CAD (coronary artery disease)  . Palpitations  . SVT (supraventricular tachycardia)  . Atrial flutter  . Encounter for long-term (current) use of anticoagulants  . Atrial flutter  . Preop cardiovascular exam    Past Medical History  Diagnosis Date  . Adenomatous colon polyp   . Palpitations     probable SVT, rate 170,, at home,, lasted one hour,, 2012  . Hypercholesterolemia   . GERD (gastroesophageal reflux disease)   . Alcohol use   . Hypertension   . Raynaud's syndrome   . TIA (transient ischemic attack)     Possible small vessel tia in the past.  . CRAO (central retinal artery  occlusion)   . BPH (benign prostatic hypertrophy)   . Carotid artery disease     0-39% R. ICA, 40-59% LICA... Doppler.... January, 2010  /  Doppler... in January, 2011 no change  /  doppler1/27/2012...stable  0-39% RICA 40-59% LICA  . Tobacco abuse   . Antiphospholipid antibody positive     ???In the past???but not proven according to patient  . Thyroid cyst     Removed in the past  . Ejection fraction      EF 50-55%, echo, March, 2012... inferobasal and distal septal hypokinesis /      60%, echo, November, 2008  . CAD (coronary artery disease)     Catheterization 2005 disease / catheterization July 06, 2010, chronic total occlusion distal RCA with collaterals from right and left side.  Medical therapy recommended, mild decreased LV function       . Antiphospholipid antibody positive     ??? in the past, but not proven according to the patient.  . Atrial flutter     atrial flutter ablation,   Dr.Allred October 18, 2010  . Myocardial infarction     Past Surgical History  Procedure Date  . Cardiac catheterization 06/27/2003    2005.... mild irregularity of the LAD..( patient had had abnormal Myoview scan)  . Inguinal hernia repair 2009    right with mesh.  Dr Carolynne Edouard  . Thyroglossal duct cyst 2006    Dr. Annalee Genta  History   Social History  . Marital Status: Married    Spouse Name: N/A    Number of Children: N/A  . 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 Smoking status: Current Every Day Smoker -- 0.5 packs/day for 40 years    Types: Cigarettes  . Smokeless tobacco: Never Used     Comment: 1 pack a day  . Alcohol Use: 12.6 oz/week    21 Cans of beer per week     Comment: he continues to drink heavily despite my recommendation to quit  . Drug Use: No  . Sexually Active: Not on file   Other Topics Concern  . Not on file   Social History Narrative  . No narrative on file    History reviewed. No pertinent  family history.  Current Outpatient Prescriptions  Medication Sig Dispense Refill  . aspirin 81 MG tablet Take 81 mg by mouth daily.        Marland Kitchen atorvastatin (LIPITOR) 40 MG tablet Take 1 tablet (40 mg total) by mouth daily. Needs office visit/labs for refills  30 tablet  0  . calcium carbonate (TUMS - DOSED IN MG ELEMENTAL CALCIUM) 500 MG chewable tablet Chew 1 tablet by mouth as needed.        Marland Kitchen lisinopril (PRINIVIL,ZESTRIL) 10 MG tablet Take 1 tablet (10 mg total) by mouth daily.  90 tablet  3  . Tamsulosin HCl (FLOMAX) 0.4 MG CAPS Take 1 capsule (0.4 mg total) by mouth daily after supper. 1 tab qd  90 capsule  3     Allergies  Allergen Reactions  . Cialis (Tadalafil)   . Levitra (Vardenafil)   . Viagra (Sildenafil Citrate)     BP 130/72  Pulse 64  Temp 98.2 F (36.8 C) (Temporal)  Resp 16  Ht 6\' 2"  (1.88 m)  Wt 166 lb 6.4 oz (75.479 kg)  BMI 21.36 kg/m2  No results found.   Review of Systems  Constitutional: Negative for fever, chills and diaphoresis.  HENT: Negative for sore throat, trouble swallowing and neck pain.   Eyes: Negative for photophobia and visual disturbance.  Respiratory: Negative for choking and shortness of breath.   Cardiovascular: Negative for chest pain and palpitations.  Gastrointestinal: Negative for nausea, vomiting, abdominal distention, anal bleeding and rectal pain.  Genitourinary: Negative for dysuria, urgency, difficulty urinating and testicular pain.  Musculoskeletal: Negative for myalgias, arthralgias and gait problem.  Skin: Negative for color change and rash.  Neurological: Negative for dizziness, speech difficulty, weakness and numbness.  Hematological: Negative for adenopathy.  Psychiatric/Behavioral: Negative for hallucinations, confusion and agitation.       Objective:   Physical Exam  Constitutional: He is oriented to person, place, and time. He appears well-developed and well-nourished. No distress.  HENT:  Head: Normocephalic.   Mouth/Throat: Oropharynx is clear and moist. No oropharyngeal exudate.  Eyes: Conjunctivae normal and EOM are normal. Pupils are equal, round, and reactive to light. No scleral icterus.  Neck: Normal range of motion. No tracheal deviation present.  Cardiovascular: Normal rate, normal heart sounds and intact distal pulses.   Pulmonary/Chest: Effort normal. No respiratory distress.  Abdominal: Soft. He exhibits no distension. There is no tenderness. Hernia confirmed negative in the right inguinal area and confirmed negative in the left inguinal area.       Incisions clean with normal healing ridges.  No hernias  Musculoskeletal: Normal range of motion. He  exhibits no tenderness.  Neurological: He is alert and oriented to person, place, and time. No cranial nerve deficit. He exhibits normal muscle tone. Coordination normal.  Skin: Skin is warm and dry. No rash noted. He is not diaphoretic.  Psychiatric: He has a normal mood and affect. His behavior is normal.       Assessment:     Status post diagnostic laparoscopy with laparoscopic repair of left inguinal hernia.  Recovering well.    Plan:     Increase activity as tolerated to regular activity.  Do not push through pain.  Diet as tolerated. Bowel regimen to avoid problems.  Return to clinic p.r.n.   Instructions discussed.  Followup with primary care physician for other health issues as would normally be done.  Questions answered.  The patient expressed understanding and appreciation  We talked to the patient about the dangers of smoking.  We stressed that tobacco use dramatically increases the risk of peri-operative complications such as infection, tissue necrosis leaving to problems with incision/wound and organ healing, heart attack, stroke, DVT, pulmonary embolism, and death.  We noted there are programs in our community to help stop smoking.

## 2012-04-19 NOTE — Patient Instructions (Addendum)
Managing Pain  Pain after surgery or related to activity is often due to strain/injury to muscle, tendon, nerves and/or incisions.  This pain is usually short-term and will improve in a few months.   Many people find it helpful to do the following things TOGETHER to help speed the process of healing and to get back to regular activity more quickly:  1. Avoid heavy physical activity a.  no lifting greater than 20 pounds b. Do not "push through" the pain.  Listen to your body and avoid positions and maneuvers than reproduce the pain c. Walking is okay as tolerated, but go slowly and stop when getting sore.  d. Remember: If it hurts to do it, then don't do it! 2. Take Anti-inflammatory medication  a. Take with food/snack around the clock for 1-2 weeks i. This helps the muscle and nerve tissues become less irritable and calm down faster b. Choose ONE of the following over-the-counter medications: i. Naproxen 220mg  tabs (ex. Aleve) 1-2 pills twice a day  ii. Ibuprofen 200mg  tabs (ex. Advil, Motrin) 3-4 pills with every meal and just before bedtime iii. Acetaminophen 500mg  tabs (Tylenol) 1-2 pills with every meal and just before bedtime 3. Use a Heating pad or Ice/Cold Pack a. 4-6 times a day b. May use warm bath/hottub  or showers 4. Try Gentle Massage and/or Stretching  a. at the area of pain many times a day b. stop if you feel pain - do not overdo it  Try these steps together to help you body heal faster and avoid making things get worse.  Doing just one of these things may not be enough.    If you are not getting better after two weeks or are noticing you are getting worse, contact our office for further advice; we may need to re-evaluate you & see what other things we can do to help.  Smoking Cessation, Tips for Success YOU CAN QUIT SMOKING If you are ready to quit smoking, congratulations! You have chosen to help yourself be healthier. Cigarettes bring nicotine, tar, carbon monoxide,  and other irritants into your body. Your lungs, heart, and blood vessels will be able to work better without these poisons. There are many different ways to quit smoking. Nicotine gum, nicotine patches, a nicotine inhaler, or nicotine nasal spray can help with physical craving. Hypnosis, support groups, and medicines help break the habit of smoking. Here are some tips to help you quit for good.  Throw away all cigarettes.  Clean and remove all ashtrays from your home, work, and car.  On a card, write down your reasons for quitting. Carry the card with you and read it when you get the urge to smoke.  Cleanse your body of nicotine. Drink enough water and fluids to keep your urine clear or pale yellow. Do this after quitting to flush the nicotine from your body.  Learn to predict your moods. Do not let a bad situation be your excuse to have a cigarette. Some situations in your life might tempt you into wanting a cigarette.  Never have "just one" cigarette. It leads to wanting another and another. Remind yourself of your decision to quit.  Change habits associated with smoking. If you smoked while driving or when feeling stressed, try other activities to replace smoking. Stand up when drinking your coffee. Brush your teeth after eating. Sit in a different chair when you read the paper. Avoid alcohol while trying to quit, and try to drink fewer caffeinated beverages.  Alcohol and caffeine may urge you to smoke.  Avoid foods and drinks that can trigger a desire to smoke, such as sugary or spicy foods and alcohol.  Ask people who smoke not to smoke around you.  Have something planned to do right after eating or having a cup of coffee. Take a walk or exercise to perk you up. This will help to keep you from overeating.  Try a relaxation exercise to calm you down and decrease your stress. Remember, you may be tense and nervous for the first 2 weeks after you quit, but this will pass.  Find new  activities to keep your hands busy. Play with a pen, coin, or rubber band. Doodle or draw things on paper.  Brush your teeth right after eating. This will help cut down on the craving for the taste of tobacco after meals. You can try mouthwash, too.  Use oral substitutes, such as lemon drops, carrots, a cinnamon stick, or chewing gum, in place of cigarettes. Keep them handy so they are available when you have the urge to smoke.  When you have the urge to smoke, try deep breathing.  Designate your home as a nonsmoking area.  If you are a heavy smoker, ask your caregiver about a prescription for nicotine chewing gum. It can ease your withdrawal from nicotine.  Reward yourself. Set aside the cigarette money you save and buy yourself something nice.  Look for support from others. Join a support group or smoking cessation program. Ask someone at home or at work to help you with your plan to quit smoking.  Always ask yourself, "Do I need this cigarette or is this just a reflex?" Tell yourself, "Today, I choose not to smoke," or "I do not want to smoke." You are reminding yourself of your decision to quit, even if you do smoke a cigarette. HOW WILL I FEEL WHEN I QUIT SMOKING?  The benefits of not smoking start within days of quitting.  You may have symptoms of withdrawal because your body is used to nicotine (the addictive substance in cigarettes). You may crave cigarettes, be irritable, feel very hungry, cough often, get headaches, or have difficulty concentrating.  The withdrawal symptoms are only temporary. They are strongest when you first quit but will go away within 10 to 14 days.  When withdrawal symptoms occur, stay in control. Think about your reasons for quitting. Remind yourself that these are signs that your body is healing and getting used to being without cigarettes.  Remember that withdrawal symptoms are easier to treat than the major diseases that smoking can cause.  Even after  the withdrawal is over, expect periodic urges to smoke. However, these cravings are generally short-lived and will go away whether you smoke or not. Do not smoke!  If you relapse and smoke again, do not lose hope. Most smokers quit 3 times before they are successful.  If you relapse, do not give up! Plan ahead and think about what you will do the next time you get the urge to smoke. LIFE AS A NONSMOKER: MAKE IT FOR A MONTH, MAKE IT FOR LIFE Day 1: Hang this page where you will see it every day. Day 2: Get rid of all ashtrays, matches, and lighters. Day 3: Drink water. Breathe deeply between sips. Day 4: Avoid places with smoke-filled air, such as bars, clubs, or the smoking section of restaurants. Day 5: Keep track of how much money you save by not smoking. Day 6: Avoid  boredom. Keep a good book with you or go to the movies. Day 7: Reward yourself! One week without smoking! Day 8: Make a dental appointment to get your teeth cleaned. Day 9: Decide how you will turn down a cigarette before it is offered to you. Day 10: Review your reasons for quitting. Day 11: Distract yourself. Stay active to keep your mind off smoking and to relieve tension. Take a walk, exercise, read a book, do a crossword puzzle, or try a new hobby. Day 12: Exercise. Get off the bus before your stop or use stairs instead of escalators. Day 13: Call on friends for support and encouragement. Day 14: Reward yourself! Two weeks without smoking! Day 15: Practice deep breathing exercises. Day 16: Bet a friend that you can stay a nonsmoker. Day 17: Ask to sit in nonsmoking sections of restaurants. Day 18: Hang up "No Smoking" signs. Day 19: Think of yourself as a nonsmoker. Day 20: Each morning, tell yourself you will not smoke. Day 21: Reward yourself! Three weeks without smoking! Day 22: Think of smoking in negative ways. Remember how it stains your teeth, gives you bad breath, and leaves you short of breath. Day 23: Eat a  nutritious breakfast. Day 24:Do not relive your days as a smoker. Day 25: Hold a pencil in your hand when talking on the telephone. Day 26: Tell all your friends you do not smoke. Day 27: Think about how much better food tastes. Day 28: Remember, one cigarette is one too many. Day 29: Take up a hobby that will keep your hands busy. Day 30: Congratulations! One month without smoking! Give yourself a big reward. Your caregiver can direct you to community resources or hospitals for support, which may include:  Group support.  Education.  Hypnosis.  Subliminal therapy. Document Released: 11/30/2003 Document Revised: 05/26/2011 Document Reviewed: 12/18/2008 [facility] Patient Information 2013 [place], [place].

## 2012-04-29 ENCOUNTER — Other Ambulatory Visit: Payer: Self-pay | Admitting: Internal Medicine

## 2012-05-01 ENCOUNTER — Other Ambulatory Visit: Payer: Self-pay

## 2012-06-21 ENCOUNTER — Ambulatory Visit (INDEPENDENT_AMBULATORY_CARE_PROVIDER_SITE_OTHER): Payer: 59 | Admitting: Emergency Medicine

## 2012-06-21 VITALS — BP 162/82 | HR 70 | Temp 97.9°F | Resp 16 | Ht 73.5 in | Wt 160.8 lb

## 2012-06-21 DIAGNOSIS — F17200 Nicotine dependence, unspecified, uncomplicated: Secondary | ICD-10-CM

## 2012-06-21 DIAGNOSIS — E789 Disorder of lipoprotein metabolism, unspecified: Secondary | ICD-10-CM

## 2012-06-21 DIAGNOSIS — E782 Mixed hyperlipidemia: Secondary | ICD-10-CM

## 2012-06-21 DIAGNOSIS — N401 Enlarged prostate with lower urinary tract symptoms: Secondary | ICD-10-CM

## 2012-06-21 DIAGNOSIS — Z7189 Other specified counseling: Secondary | ICD-10-CM

## 2012-06-21 DIAGNOSIS — I10 Essential (primary) hypertension: Secondary | ICD-10-CM

## 2012-06-21 LAB — POCT CBC
HCT, POC: 44.6 % (ref 43.5–53.7)
Hemoglobin: 14.1 g/dL (ref 14.1–18.1)
Lymph, poc: 1.7 (ref 0.6–3.4)
MCH, POC: 30.6 pg (ref 27–31.2)
MCHC: 31.6 g/dL — AB (ref 31.8–35.4)
MCV: 96.8 fL (ref 80–97)
POC MID %: 5.4 %M (ref 0–12)
RBC: 4.61 M/uL — AB (ref 4.69–6.13)
WBC: 7.3 10*3/uL (ref 4.6–10.2)

## 2012-06-21 LAB — COMPREHENSIVE METABOLIC PANEL
AST: 20 U/L (ref 0–37)
Albumin: 4.9 g/dL (ref 3.5–5.2)
BUN: 15 mg/dL (ref 6–23)
CO2: 28 mEq/L (ref 19–32)
Calcium: 10 mg/dL (ref 8.4–10.5)
Chloride: 103 mEq/L (ref 96–112)
Creat: 0.98 mg/dL (ref 0.50–1.35)
Glucose, Bld: 100 mg/dL — ABNORMAL HIGH (ref 70–99)
Potassium: 4.8 mEq/L (ref 3.5–5.3)

## 2012-06-21 LAB — POCT URINALYSIS DIPSTICK
Bilirubin, UA: NEGATIVE
Glucose, UA: NEGATIVE
Ketones, UA: NEGATIVE
Nitrite, UA: NEGATIVE
Spec Grav, UA: 1.01
pH, UA: 5

## 2012-06-21 LAB — LIPID PANEL
Cholesterol: 152 mg/dL (ref 0–200)
HDL: 70 mg/dL (ref >39)

## 2012-06-21 LAB — PSA: PSA: 1.9 ng/mL (ref <=4.00)

## 2012-06-21 MED ORDER — LISINOPRIL 20 MG PO TABS: 10.0000 mg | ORAL_TABLET | Freq: Every day | ORAL | Status: DC

## 2012-06-21 MED ORDER — TAMSULOSIN HCL 0.4 MG PO CAPS: 0.4000 mg | ORAL_CAPSULE | Freq: Every day | ORAL | Status: DC

## 2012-06-21 NOTE — Patient Instructions (Signed)
Sunburn Sunburn is damage to the skin caused by overexposure to ultraviolet (UV) rays. People with light skin or a fair complexion may be more susceptible to sunburn. Repeated sun exposure causes early skin aging such as wrinkles and sun spots. It also increases the risk of skin cancer. CAUSES A sunburn is caused by getting too much UV radiation from the sun. SYMPTOMS  Red or pink skin.  Soreness and swelling.  Pain.  Blisters.  Peeling skin.  Headache, fever, and fatigue if sunburn covers a large area. TREATMENT  Your caregiver may tell you to take certain medicines to lessen inflammation.  Your caregiver may have you use hydrocortisone cream or spray to help with itching and inflammation.  Your caregiver may prescribe an antibiotic cream to use on blisters. HOME CARE INSTRUCTIONS   Avoid further exposure to the sun.  Cool baths and cool compresses may be helpful if used several times per day. Do not apply ice, since this may result in more damage to the skin.  Only take over-the-counter or prescription medicines for pain, discomfort, or fever as directed by your caregiver.  Use aloe or other over-the-counter sunburn creams or gels on your skin. Do not apply these creams or gels on blisters.  Drink enough fluids to keep your urine clear or pale yellow.  Do not break blisters. If blisters break, your caregiver may recommend an antibiotic cream to apply to the affected area. PREVENTION   Try to avoid the sun between 10:00 a.m. and 4:00 p.m. when it is the strongest.  Use a sunscreen or sunblock with SPF 30 or greater.  Apply sunscreen at least 30 minutes before exposure to the sun.  Always wear protective hats, clothing, and sunglasses with UV protection.  Avoid medicines, herbs, and foods that increase your sensitivity to sunlight.  Avoid tanning beds. SEEK IMMEDIATE MEDICAL CARE IF:   You have a fever.  Your pain is uncontrolled with medicine.  You start to  vomit or have diarrhea.  You feel faint or develop a headache with confusion.  You develop severe blistering.  You have a pus-like (purulent) discharge coming from the blisters.  Your burn becomes more painful and swollen. MAKE SURE YOU:  Understand these instructions.  Will watch your condition.  Will get help right away if you are not doing well or get worse. Document Released: 12/11/2004 Document Revised: 05/26/2011 Document Reviewed: 08/25/2010 [facility] Patient Information 2013 [place], [place].

## 2012-06-21 NOTE — Progress Notes (Signed)
Reviewed and agree.

## 2012-06-21 NOTE — Progress Notes (Signed)
[facility] [address] [place] [address] [phone]  Date:  06/21/2012   Name:  [name]   DOB:  Apr 02, 1950   MRN:  [id number]  PCP:  Ardeen Garland, MD    Chief Complaint: Medication Refill   History of Present Illness:  [name] is a 62 y.o. very pleasant male patient who presents with the following:  History of CAD, hypertension and hyperlipidemia.  For labs.  Is fasting.  Up to date on immunizations.  Smokes and is not interested in stopping.  Denies other complaint or health concern today.   Patient Active Problem List  Diagnosis  . ADENOMATOUS COLONIC POLYP  . THYROID CYST  . HYPERCHOLESTEROLEMIA  . ALCOHOL USE  . TOBACCO ABUSE  . CENTRAL RETINAL ARTERY OCCLUSION  . HYPERTENSION  . GERD  . INGUINAL HERNIA, LEFT  . BENIGN PROSTATIC HYPERTROPHY  . PERSONAL HISTORY OF COLONIC POLYPS  . ABNORMAL ELECTROCARDIOGRAM  . Hypertension  . Raynaud's syndrome  . TIA (transient ischemic attack)  . CRAO (central retinal artery occlusion)  . Carotid artery disease  . Ejection fraction  . CAD (coronary artery disease)  . Palpitations  . SVT (supraventricular tachycardia)  . Atrial flutter  . Encounter for long-term (current) use of anticoagulants  . Atrial flutter  . Preop cardiovascular exam    Past Medical History  Diagnosis Date  . Adenomatous colon polyp   . Palpitations     probable SVT, rate 170,, at home,, lasted one hour,, 2012  . Hypercholesterolemia   . GERD (gastroesophageal reflux disease)   . Alcohol use   . Hypertension   . Raynaud's syndrome   . TIA (transient ischemic attack)     Possible small vessel tia in the past.  . CRAO (central retinal artery occlusion)   . BPH (benign prostatic hypertrophy)   . Carotid artery disease     0-39% R. ICA, 40-59% LICA... Doppler.... January, 2010  /  Doppler... in January, 2011 no change  /  doppler1/27/2012...stable  0-39% RICA 40-59% LICA  . Tobacco abuse   .  Antiphospholipid antibody positive     ???In the past???but not proven according to patient  . Ejection fraction      EF 50-55%, echo, March, 2012... inferobasal and distal septal hypokinesis /      60%, echo, November, 2008  . CAD (coronary artery disease)     Catheterization 2005 disease / catheterization July 06, 2010, chronic total occlusion distal RCA with collaterals from right and left side.  Medical therapy recommended, mild decreased LV function       . Antiphospholipid antibody positive     ??? in the past, but not proven according to the patient.  . Atrial flutter     atrial flutter ablation,   Dr.Allred October 18, 2010  . Myocardial infarction     Past Surgical History  Procedure Laterality Date  . Cardiac catheterization  06/27/2003    2005.... mild irregularity of the LAD..( patient had had abnormal Myoview scan)  . Inguinal hernia repair  2009    right with mesh.  Dr Carolynne Edouard  . Thyroglossal duct cyst  2006    Dr. Annalee Genta    History  Substance Use Topics  . Smoking status: Current Every Day Smoker -- 0.50 packs/day for 40 years    Types: Cigarettes  . Smokeless tobacco: Never Used     Comment: 1 pack a day  . Alcohol Use: 12.6 oz/week  21 Cans of beer per week     Comment: he continues to drink heavily despite my recommendation to quit    Family History  Problem Relation Age of Onset  . Family history unknown: Yes    Allergies  Allergen Reactions  . Cialis (Tadalafil)   . Levitra (Vardenafil)   . Viagra (Sildenafil Citrate)     Medication list has been reviewed and updated.  Current Outpatient Prescriptions on File Prior to Visit  Medication Sig Dispense Refill  . aspirin 81 MG tablet Take 81 mg by mouth daily.        Marland Kitchen atorvastatin (LIPITOR) 40 MG tablet Take 1 tablet (40 mg total) by mouth daily. Needs office visit/labs for refills  30 tablet  0  . calcium carbonate (TUMS - DOSED IN MG ELEMENTAL CALCIUM) 500 MG chewable tablet Chew 1 tablet by mouth  as needed.        Marland Kitchen lisinopril (PRINIVIL,ZESTRIL) 10 MG tablet Take 1 tablet (10 mg total) by mouth daily. Need office visit for additional refills.  90 tablet  0  . Tamsulosin HCl (FLOMAX) 0.4 MG CAPS Take 1 capsule (0.4 mg total) by mouth daily after supper. 1 tab qd  90 capsule  3   No current facility-administered medications on file prior to visit.    Review of Systems:  As per HPI, otherwise negative.    Physical Examination: Filed Vitals:   06/21/12 0852  BP: 162/82  Pulse: 70  Temp: 97.9 F (36.6 C)  Resp: 16   Filed Vitals:   06/21/12 0852  Height: 6' 1.5" (1.867 m)  Weight: 160 lb 12.8 oz (72.938 kg)   Body mass index is 20.92 kg/(m^2). Ideal Body Weight: Weight in (lb) to have BMI = 25: 191.7  GEN: WDWN, NAD, Non-toxic, A & O x 3 HEENT: Atraumatic, Normocephalic. Neck supple. No masses, No LAD.  Solar changes on nose and ears. Ears and Nose: No external deformity. CV: RRR, No M/G/R. No JVD. No thrill. No extra heart sounds. PULM: CTA B, no wheezes, crackles, rhonchi. No retractions. No resp. distress. No accessory muscle use. ABD: S, NT, ND, +BS. No rebound. No HSM. EXTR: No c/c/e NEURO Normal gait.  PSYCH: Normally interactive. Conversant. Not depressed or anxious appearing.  Calm demeanor.  Rectal deferred as he is referred to urology.  Assessment and Plan: Hyperlipidemia Hypertension not well controlled Increase lisinopril Smoker Sunburn nose and ears Counseled to stop smoking and use sunscreen  Follow up on labs Signed,  Phillips Odor, MD

## 2012-07-17 ENCOUNTER — Other Ambulatory Visit: Payer: Self-pay | Admitting: Internal Medicine

## 2012-10-24 ENCOUNTER — Other Ambulatory Visit: Payer: Self-pay | Admitting: Physician Assistant

## 2013-01-14 ENCOUNTER — Other Ambulatory Visit: Payer: Self-pay | Admitting: Emergency Medicine

## 2013-01-20 ENCOUNTER — Other Ambulatory Visit: Payer: Self-pay

## 2013-01-28 ENCOUNTER — Other Ambulatory Visit: Payer: Self-pay | Admitting: Emergency Medicine

## 2013-02-26 ENCOUNTER — Other Ambulatory Visit: Payer: Self-pay | Admitting: Emergency Medicine

## 2013-03-21 ENCOUNTER — Other Ambulatory Visit: Payer: Self-pay | Admitting: Physician Assistant

## 2013-04-01 ENCOUNTER — Other Ambulatory Visit (HOSPITAL_COMMUNITY): Payer: Self-pay | Admitting: Cardiology

## 2013-04-01 DIAGNOSIS — I6529 Occlusion and stenosis of unspecified carotid artery: Secondary | ICD-10-CM

## 2013-04-09 ENCOUNTER — Other Ambulatory Visit: Payer: Self-pay | Admitting: Physician Assistant

## 2013-04-10 NOTE — Telephone Encounter (Signed)
Needs office visit.

## 2013-04-22 ENCOUNTER — Ambulatory Visit (INDEPENDENT_AMBULATORY_CARE_PROVIDER_SITE_OTHER): Payer: 59 | Admitting: Cardiology

## 2013-04-22 ENCOUNTER — Ambulatory Visit (HOSPITAL_COMMUNITY): Payer: 59 | Attending: Internal Medicine

## 2013-04-22 ENCOUNTER — Encounter: Payer: Self-pay | Admitting: Cardiology

## 2013-04-22 VITALS — BP 130/58 | HR 66 | Ht 73.5 in | Wt 162.0 lb

## 2013-04-22 DIAGNOSIS — I658 Occlusion and stenosis of other precerebral arteries: Secondary | ICD-10-CM | POA: Insufficient documentation

## 2013-04-22 DIAGNOSIS — F17200 Nicotine dependence, unspecified, uncomplicated: Secondary | ICD-10-CM | POA: Insufficient documentation

## 2013-04-22 DIAGNOSIS — I10 Essential (primary) hypertension: Secondary | ICD-10-CM | POA: Insufficient documentation

## 2013-04-22 DIAGNOSIS — R002 Palpitations: Secondary | ICD-10-CM

## 2013-04-22 DIAGNOSIS — R9430 Abnormal result of cardiovascular function study, unspecified: Secondary | ICD-10-CM

## 2013-04-22 DIAGNOSIS — R0989 Other specified symptoms and signs involving the circulatory and respiratory systems: Secondary | ICD-10-CM

## 2013-04-22 DIAGNOSIS — I6529 Occlusion and stenosis of unspecified carotid artery: Secondary | ICD-10-CM | POA: Insufficient documentation

## 2013-04-22 DIAGNOSIS — I4892 Unspecified atrial flutter: Secondary | ICD-10-CM

## 2013-04-22 DIAGNOSIS — IMO0002 Reserved for concepts with insufficient information to code with codable children: Secondary | ICD-10-CM

## 2013-04-22 DIAGNOSIS — E7800 Pure hypercholesterolemia, unspecified: Secondary | ICD-10-CM

## 2013-04-22 DIAGNOSIS — E785 Hyperlipidemia, unspecified: Secondary | ICD-10-CM | POA: Insufficient documentation

## 2013-04-22 DIAGNOSIS — I739 Peripheral vascular disease, unspecified: Secondary | ICD-10-CM

## 2013-04-22 DIAGNOSIS — I2510 Atherosclerotic heart disease of native coronary artery without angina pectoris: Secondary | ICD-10-CM

## 2013-04-22 DIAGNOSIS — I779 Disorder of arteries and arterioles, unspecified: Secondary | ICD-10-CM

## 2013-04-22 NOTE — Assessment & Plan Note (Signed)
His lipids are being treated appropriately. No change in therapy. 

## 2013-04-22 NOTE — Patient Instructions (Signed)
Repeat Carotid Dopplers in 1 year.    Your physician wants you to follow-up in: 1 year. You will receive a reminder letter in the mail two months in advance. If you don't receive a letter, please call our office to schedule the follow-up appointment.

## 2013-04-22 NOTE — Assessment & Plan Note (Signed)
Blood pressure is controlled. No change in therapy. 

## 2013-04-22 NOTE — Assessment & Plan Note (Signed)
He does have some mild palpitations. However these are limited. He does not need a followup monitor at this time.

## 2013-04-22 NOTE — Assessment & Plan Note (Signed)
He has significant carotid disease. He had a followup Doppler today. I have reviewed the study and discussed it with him. It is stable at this time. One year follow up will be planned.

## 2013-04-22 NOTE — Assessment & Plan Note (Signed)
We've had an extensive discussion about smoking. I urged him to try to cut back.

## 2013-04-22 NOTE — Assessment & Plan Note (Signed)
His Doppler does suggest some increased velocity in his right subclavian artery. He does not have any significant symptoms.  As part of today's evaluation I spent greater than 25 minutes with his total care. More than half of the time has been with direct care with him. We discussed smoking at great length.

## 2013-04-22 NOTE — Assessment & Plan Note (Signed)
He has had atrial flutter ablation. He's not having any further prolonged palpitations. No further workup.

## 2013-04-22 NOTE — Assessment & Plan Note (Signed)
Patient's ejection fraction is 50-55%. We know from the past that he has some inferobasal and distal septal hypokinesis. His last echo was March, 2012. He does not need an echo now.

## 2013-04-22 NOTE — Assessment & Plan Note (Signed)
Coronary disease is stable. 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 He is on aspirin and a statin. His blood pressure is controlled. I've had a long and careful discussion with him about his smoking. I explained to him again the importance of stopping for so many reasons.

## 2013-04-22 NOTE — Progress Notes (Signed)
HPI  Patient is seen today to followup coronary disease and atrial flutter. He is stable. He's not having any chest pain. He has some rare palpitations. He has no prolonged palpitations. He's not having any significant shortness of breath. He is still smoking. He has cut down but still smokes at least half a pack per day. I had a long discussion with him about this.  Allergies  Allergen Reactions  . Cialis [Tadalafil]   . Levitra [Vardenafil]   . Viagra [Sildenafil Citrate]     Current Outpatient Prescriptions  Medication Sig Dispense Refill  . aspirin 81 MG tablet Take 81 mg by mouth daily.        Marland Kitchen atorvastatin (LIPITOR) 40 MG tablet Take 1 tablet (40 mg total) by mouth daily. Needs office visit/labs for refills  30 tablet  0  . calcium carbonate (TUMS - DOSED IN MG ELEMENTAL CALCIUM) 500 MG chewable tablet Chew 1 tablet by mouth as needed.        Marland Kitchen lisinopril (PRINIVIL,ZESTRIL) 20 MG tablet TAKE 1/2 TABLET BY MOUTH DAILY  45 tablet  0   No current facility-administered medications for this visit.    History   Social History  . Marital Status: Married    Spouse Name: N/A    Number of Children: N/A  . 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 Smoking status: Current Every Day Smoker -- 0.50 packs/day for 40 years    Types: Cigarettes  . Smokeless tobacco: Never Used     Comment: 1 pack a day  . Alcohol Use: 12.6 oz/week    21 Cans of beer per week     Comment: he continues to drink heavily despite my recommendation to quit  . Drug Use: No  . Sexual Activity: Not on file   Other Topics Concern  . Not on file   Social History Narrative  . No narrative on file    History reviewed. No pertinent family history.  Past Medical History  Diagnosis Date  . Adenomatous colon polyp   . Palpitations     probable SVT, rate 170,, at home,, lasted one hour,, 2012  . Hypercholesterolemia   . GERD (gastroesophageal  reflux disease)   . Alcohol use   . Hypertension   . Raynaud's syndrome   . TIA (transient ischemic attack)     Possible small vessel tia in the past.  . CRAO (central retinal artery occlusion)   . BPH (benign prostatic hypertrophy)   . Carotid artery disease     3-15% R. ICA, 40-08% LICA... Doppler.... January, 2010  /  Doppler... in January, 2011 no change  /  doppler1/27/2012...stable  6-76% RICA 19-50% LICA  . Tobacco abuse   . Antiphospholipid antibody positive     ???In the past???but not proven according to patient  . Ejection fraction      EF 50-55%, echo, March, 2012... inferobasal and distal septal hypokinesis /      60%, echo, November, 2008  . CAD (coronary artery disease)     Catheterization 2005 disease / catheterization July 06, 2010, chronic total occlusion distal RCA with collaterals from right and left side.  Medical therapy recommended, mild decreased LV function       . Antiphospholipid antibody positive     ??? in the past, but not proven according to the patient.  . Atrial flutter     atrial flutter  ablation,   Dr.Allred October 18, 2010  . Myocardial infarction     Past Surgical History  Procedure Laterality Date  . Cardiac catheterization  06/27/2003    2005.... mild irregularity of the LAD..( patient had had abnormal Myoview scan)  . Inguinal hernia repair  2009    right with mesh.  Dr Marlou Starks  . Thyroglossal duct cyst  2006    Dr. Wilburn Cornelia    Patient Active Problem List   Diagnosis Date Noted  . Palpitations     Priority: High  . SVT (supraventricular tachycardia)     Priority: High  . CAD (coronary artery disease)     Priority: High  . Ejection fraction     Priority: High  . 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 Atrial flutter   . Atrial flutter 09/05/2010  . Encounter for long-term (current) use of anticoagulants 09/05/2010  . Hypertension   . Raynaud's syndrome   . TIA (transient ischemic attack)   . CRAO (central retinal artery occlusion)    . Carotid artery disease   . ABNORMAL ELECTROCARDIOGRAM 05/10/2010  . PERSONAL HISTORY OF COLONIC POLYPS 12/05/2009  . TOBACCO ABUSE 04/12/2009  . INGUINAL HERNIA, LEFT 12/19/2008  . BENIGN PROSTATIC HYPERTROPHY 12/19/2008  . ADENOMATOUS COLONIC POLYP 07/05/2007  . THYROID CYST 07/05/2007  . HYPERCHOLESTEROLEMIA 07/05/2007  . ALCOHOL USE 07/05/2007  . CENTRAL RETINAL ARTERY OCCLUSION 07/05/2007  . HYPERTENSION 07/05/2007  . GERD 07/05/2007    ROS   Patient denies fever, chills, headache, sweats, rash, change in vision, change in hearing, chest pain, cough, nausea vomiting, urinary symptoms. All other systems are reviewed and are negative.  PHYSICAL EXAM  Patient is oriented to person time and place. Affect is normal. There is no jugulovenous distention. Lungs are clear. Respiratory effort is nonlabored. Cardiac exam reveals S1 and S2. There no clicks or significant murmurs. Abdomen is soft. There is no peripheral edema. He does have the diffuse skin changes that seem compatible with Raynaud's.  Filed Vitals:   04/22/13 1457  BP: 130/58  Pulse: 66  Height: 6' 1.5" (1.867 m)  Weight: 162 lb (73.483 kg)   EKG is done today and reviewed by me. He has sinus rhythm. There is no significant change. There is no significant abnormality.  ASSESSMENT & PLAN

## 2013-08-20 ENCOUNTER — Ambulatory Visit (INDEPENDENT_AMBULATORY_CARE_PROVIDER_SITE_OTHER): Payer: 59 | Admitting: Physician Assistant

## 2013-08-20 VITALS — BP 128/78 | HR 62 | Temp 97.7°F | Resp 16 | Ht 74.0 in | Wt 158.0 lb

## 2013-08-20 DIAGNOSIS — I10 Essential (primary) hypertension: Secondary | ICD-10-CM

## 2013-08-20 DIAGNOSIS — N400 Enlarged prostate without lower urinary tract symptoms: Secondary | ICD-10-CM

## 2013-08-20 DIAGNOSIS — I2510 Atherosclerotic heart disease of native coronary artery without angina pectoris: Secondary | ICD-10-CM

## 2013-08-20 DIAGNOSIS — R202 Paresthesia of skin: Secondary | ICD-10-CM

## 2013-08-20 DIAGNOSIS — E785 Hyperlipidemia, unspecified: Secondary | ICD-10-CM

## 2013-08-20 DIAGNOSIS — R209 Unspecified disturbances of skin sensation: Secondary | ICD-10-CM

## 2013-08-20 DIAGNOSIS — F17200 Nicotine dependence, unspecified, uncomplicated: Secondary | ICD-10-CM

## 2013-08-20 LAB — FOLATE: Folate: 15.8 ng/mL

## 2013-08-20 LAB — LIPID PANEL
CHOL/HDL RATIO: 2.3 ratio
Cholesterol: 130 mg/dL (ref 0–200)
HDL: 56 mg/dL (ref >39)
LDL CALC: 67 mg/dL (ref 0–99)
Triglycerides: 35 mg/dL (ref <150)
VLDL: 7 mg/dL (ref 0–40)

## 2013-08-20 LAB — COMPREHENSIVE METABOLIC PANEL
ALBUMIN: 4.6 g/dL (ref 3.5–5.2)
ALT: 26 U/L (ref 0–53)
AST: 22 U/L (ref 0–37)
Alkaline Phosphatase: 62 U/L (ref 39–117)
BUN: 14 mg/dL (ref 6–23)
CHLORIDE: 103 meq/L (ref 96–112)
CO2: 28 meq/L (ref 19–32)
CREATININE: 0.98 mg/dL (ref 0.50–1.35)
Calcium: 9.7 mg/dL (ref 8.4–10.5)
Glucose, Bld: 95 mg/dL (ref 70–99)
POTASSIUM: 5.1 meq/L (ref 3.5–5.3)
Sodium: 137 mEq/L (ref 135–145)
TOTAL PROTEIN: 6.7 g/dL (ref 6.0–8.3)
Total Bilirubin: 0.5 mg/dL (ref 0.2–1.2)

## 2013-08-20 LAB — POCT CBC
GRANULOCYTE PERCENT: 70.7 % (ref 37–80)
HCT, POC: 45.3 % (ref 43.5–53.7)
Hemoglobin: 14.5 g/dL (ref 14.1–18.1)
Lymph, poc: 1.7 (ref 0.6–3.4)
MCH, POC: 31.6 pg — AB (ref 27–31.2)
MCHC: 32 g/dL (ref 31.8–35.4)
MCV: 98.7 fL — AB (ref 80–97)
MID (cbc): 0.5 (ref 0–0.9)
MPV: 9.3 fL (ref 0–99.8)
PLATELET COUNT, POC: 283 10*3/uL (ref 142–424)
POC Granulocyte: 5.3 (ref 2–6.9)
POC LYMPH PERCENT: 22.9 %L (ref 10–50)
POC MID %: 6.4 % (ref 0–12)
RBC: 4.59 M/uL — AB (ref 4.69–6.13)
RDW, POC: 14.5 %
WBC: 7.5 10*3/uL (ref 4.6–10.2)

## 2013-08-20 LAB — TSH: TSH: 1.722 u[IU]/mL (ref 0.350–4.500)

## 2013-08-20 LAB — VITAMIN B12: Vitamin B-12: 407 pg/mL (ref 211–911)

## 2013-08-20 NOTE — Progress Notes (Signed)
Subjective:    Patient ID: [name], male    DOB: Feb 17, 1951, 63 y.o.   MRN: [id number]  HPI Primary Physician: Orland Mustard, MD  Chief Complaint: Medication refill  HPI: 63 y.o. male with history of CAD, atrial flutter, palpitations,  hypertension, hyperlipidemia, subclavian artery disease, tobacco use, TIA, BPH, and reflux presents for medication refill of his lisinopril and Lipitor.    HTN: Currently taking lisinopril 20 mg daily. Tolerating without issues. Very seldom checks BP at home. Cannot recall any blood readings. No chest pain, chest tightness, headaches, or vision changes. Drinks 2-3 beers per day. Smoking less than 1 PPD now.     Hyperlipidemia: Currently taking Lipitor 40 mg daily. Tolerating without issues. Eats "normal stuff." Not big on fast foods. Gets a lot of walking in at work. No myalgias.   Paresthesia right YKD:XIPJA within the past 6 months the right leg has gone numb for a "split second." "Then it's fine." One time he was washing laundry when this occurred and the other time he was in the shower when this occurred. He feels like it is the whole leg from the knee to the hip "pretty much."  BPH: Gets up 2-3 times per night to void. Was on Flomax 1 year ago and self discontinued the medication because he did not feel like it was helping. Would like to go back on the medication.   Tobacco use: Smoking less than 1 PPD now. Has smoked 40 years. Would love to quit, but has not pressed it that hard to try to quit. Not interested in Chantix or Zyban. Considering gum.   Atrial flutter/palpitations: Followed by Dr. Ron Parker. No changes in the frequency or duration of his palpitations from his office visit with Dr. Ron Parker on 04/22/13. Monitor was not indicated at that time.    Past Medical History  Diagnosis Date  . Adenomatous colon polyp   . Palpitations     probable SVT, rate 170,, at home,, lasted one hour,, 2012  . Hypercholesterolemia   . GERD (gastroesophageal reflux  disease)   . Alcohol use   . Hypertension   . Raynaud's syndrome   . TIA (transient ischemic attack)     Possible small vessel tia in the past.  . CRAO (central retinal artery occlusion)   . BPH (benign prostatic hypertrophy)   . Carotid artery disease     2-50% R. ICA, 53-97% LICA... Doppler.... January, 2010  /  Doppler... in January, 2011 no change  /  doppler1/27/2012...stable  6-73% RICA 41-93% LICA  . Tobacco abuse   . Antiphospholipid antibody positive     ???In the past???but not proven according to patient  . Ejection fraction      EF 50-55%, echo, March, 2012... inferobasal and distal septal hypokinesis /      60%, echo, November, 2008  . CAD (coronary artery disease)     Catheterization 2005 disease / catheterization July 06, 2010, chronic total occlusion distal RCA with collaterals from right and left side.  Medical therapy recommended, mild decreased LV function       . Antiphospholipid antibody positive     ??? in the past, but not proven according to the patient.  . Atrial flutter     atrial flutter ablation,   Dr.Allred October 18, 2010  . Myocardial infarction      Home Meds: Prior to Admission medications   Medication Sig Start Date End Date Taking? Authorizing Provider  aspirin 81 MG tablet Take 81  mg by mouth daily.     Yes Historical Provider, MD  atorvastatin (LIPITOR) 40 MG tablet Take 1 tablet (40 mg total) by mouth daily. Needs office visit/labs for refills 09/08/11  Yes Chelle S Jeffery, PA-C  calcium carbonate (TUMS - DOSED IN MG ELEMENTAL CALCIUM) 500 MG chewable tablet Chew 1 tablet by mouth as needed.     Yes Historical Provider, MD  lisinopril (PRINIVIL,ZESTRIL) 20 MG tablet TAKE 1/2 TABLET BY MOUTH DAILY 01/14/13  Yes Collene Leyden, PA-C    Allergies:  Allergies  Allergen Reactions  . Cialis [Tadalafil]   . Levitra [Vardenafil]   . Viagra [Sildenafil Citrate]     History   Social History  . Marital Status: Married    Spouse Name: N/A     Number of Children: N/A  . 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 Smoking status: Current Every Day Smoker -- 0.50 packs/day for 40 years    Types: Cigarettes  . Smokeless tobacco: Never Used     Comment: 1 pack a day  . Alcohol Use: 12.6 oz/week    21 Cans of beer per week     Comment: he continues to drink heavily despite my recommendation to quit  . Drug Use: No  . Sexual Activity: Not on file   Other Topics Concern  . Not on file   Social History Narrative  . No narrative on file     Review of Systems  Constitutional: Positive for unexpected weight change. Negative for fever, chills, diaphoresis and fatigue.       Weight change from 170 to 158 over 1 year.   HENT: Negative for congestion.   Eyes: Negative for visual disturbance.  Respiratory: Negative for apnea, cough, choking, chest tightness, shortness of breath, wheezing and stridor.   Cardiovascular: Positive for palpitations. Negative for chest pain and leg swelling.       Negative for edema or DOE.  Gastrointestinal: Negative for nausea, vomiting, abdominal pain and diarrhea.  Neurological: Positive for numbness. Negative for dizziness, tremors, seizures, syncope, facial asymmetry, speech difficulty, weakness, light-headedness and headaches.       Numbness along right leg from hip to knee x 2 in past 6 months for "split second."  Psychiatric/Behavioral: Negative for hallucinations, behavioral problems, confusion, decreased concentration and agitation. The patient is not nervous/anxious and is not hyperactive.        Objective:   Physical Exam  Physical Exam: Blood pressure 128/78, pulse 62, temperature 97.7 F (36.5 C), temperature source Oral, resp. rate 16, height 6\' 2"  (1.88 m), weight 158 lb (71.668 kg), SpO2 100.00%., Body mass index is 20.28 kg/(m^2). General: Well developed, well nourished, in no acute distress. Head: Normocephalic, atraumatic,  eyes without discharge, sclera non-icteric, nares are without discharge. Bilateral auditory canals clear, TM's are without perforation, pearly grey and translucent with reflective cone of light bilaterally. Oral cavity moist, posterior pharynx without exudate, erythema, peritonsillar abscess, or post nasal drip. Uvula midline.   Neck: Supple. No thyromegaly. Full ROM. No lymphadenopathy. No nuchal rigidity.  Lungs: Clear bilaterally to auscultation without wheezes, rales, or rhonchi. Breathing is unlabored. Heart: RRR with S1 S2. No murmurs, rubs, or gallops appreciated. Msk:  Strength and tone normal for age. Extremities/Skin: Warm and dry. No clubbing or cyanosis. No edema. No rashes or suspicious lesions. Neuro: Alert and oriented X 3. Moves all extremities spontaneously. Gait is normal. CNII-XII grossly in  tact. Psych:  Responds to questions appropriately with a normal affect.    Labs: Results for orders placed in visit on 08/20/13  POCT CBC      Result Value Ref Range   WBC 7.5  4.6 - 10.2 K/uL   Lymph, poc 1.7  0.6 - 3.4   POC LYMPH PERCENT 22.9  10 - 50 %L   MID (cbc) 0.5  0 - 0.9   POC MID % 6.4  0 - 12 %M   POC Granulocyte 5.3  2 - 6.9   Granulocyte percent 70.7  37 - 80 %G   RBC 4.59 (*) 4.69 - 6.13 M/uL   Hemoglobin 14.5  14.1 - 18.1 g/dL   HCT, POC 45.3  43.5 - 53.7 %   MCV 98.7 (*) 80 - 97 fL   MCH, POC 31.6 (*) 27 - 31.2 pg   MCHC 32.0  31.8 - 35.4 g/dL   RDW, POC 14.5     Platelet Count, POC 283  142 - 424 K/uL   MPV 9.3  0 - 99.8 fL    CMP, lipid, TSH, PSA, B12, and Folate pending, not fasting. Had coffee with milk in it this AM.     Assessment & Plan:  63 year old male with CAD, atrial flutter, palpitations, hypertension, hyperlipidemia, BPH, tobacco use, and paresthesia of the right leg  1) CAD -Reviewed Dr. Ron Parker note. Has known total occulusion of the distal right with collaterals, 2012.  -Continue aspirin and statin -Long discussion regarding smoking  cessation, he is agreeable to attempt this at this time, however does not want anything po. He understands the risks of smoking.    2) Atrial flutter/palpitations -Followed by Dr. Ron Parker -If his palpitations change advised patient to follow up with his cardiologist   3) Hypertension -Controlled -Continue lisinopril 20 mg daily #30 RF 5 -Decrease alcohol intake -Discussed tobacco cessation -Healthy diet and exercise  -Follow up 6 months  4) Hyperlipidemia -Await labs -Healthy diet and exercise  -Will refill statin based on lipid panel   5) BPH -Check PSA -Restart Flomax 0.4 mg qhs #30 RF  6) Tobacco use -Long discussion about cessation -Patient to try nicotine gum -Will RTC if needed  7) Paresthesias right leg -Refer to vascular for evaluation given is extensive vascular history. However, also must be concerned for metastatic disease along the spine. May need neuroimaging as well. Both of these options were discussed with him detail.  -Decrease alcohol intake  -Await labs   Christell Faith, MHS, PA-C [facility] [address] [place], [place] [address] [place] 08/20/2013 8:41 AM

## 2013-08-22 ENCOUNTER — Other Ambulatory Visit: Payer: Self-pay | Admitting: Physician Assistant

## 2013-08-22 DIAGNOSIS — E789 Disorder of lipoprotein metabolism, unspecified: Secondary | ICD-10-CM

## 2013-08-22 DIAGNOSIS — I10 Essential (primary) hypertension: Secondary | ICD-10-CM

## 2013-08-22 DIAGNOSIS — Z7189 Other specified counseling: Secondary | ICD-10-CM

## 2013-08-22 LAB — PSA: PSA: 2.12 ng/mL (ref <=4.00)

## 2013-08-22 MED ORDER — LISINOPRIL 20 MG PO TABS: 20.0000 mg | ORAL_TABLET | Freq: Every day | ORAL | Status: DC

## 2013-08-22 MED ORDER — ATORVASTATIN CALCIUM 40 MG PO TABS: 40.0000 mg | ORAL_TABLET | Freq: Every day | ORAL | Status: DC

## 2013-09-05 ENCOUNTER — Other Ambulatory Visit: Payer: Self-pay

## 2013-09-05 ENCOUNTER — Encounter (HOSPITAL_COMMUNITY): Payer: Self-pay | Admitting: Emergency Medicine

## 2013-09-05 ENCOUNTER — Emergency Department (HOSPITAL_COMMUNITY): Payer: 59

## 2013-09-05 ENCOUNTER — Emergency Department (HOSPITAL_COMMUNITY)
Admission: EM | Admit: 2013-09-05 | Discharge: 2013-09-05 | Disposition: A | Discharge status: Home / Self Care | Payer: 59 | Attending: Emergency Medicine | Admitting: Emergency Medicine

## 2013-09-05 DIAGNOSIS — Z8673 Personal history of transient ischemic attack (TIA), and cerebral infarction without residual deficits: Secondary | ICD-10-CM | POA: Insufficient documentation

## 2013-09-05 DIAGNOSIS — F17200 Nicotine dependence, unspecified, uncomplicated: Secondary | ICD-10-CM | POA: Insufficient documentation

## 2013-09-05 DIAGNOSIS — I252 Old myocardial infarction: Secondary | ICD-10-CM | POA: Insufficient documentation

## 2013-09-05 DIAGNOSIS — Z8669 Personal history of other diseases of the nervous system and sense organs: Secondary | ICD-10-CM | POA: Insufficient documentation

## 2013-09-05 DIAGNOSIS — Z9889 Other specified postprocedural states: Secondary | ICD-10-CM | POA: Insufficient documentation

## 2013-09-05 DIAGNOSIS — I2510 Atherosclerotic heart disease of native coronary artery without angina pectoris: Secondary | ICD-10-CM | POA: Insufficient documentation

## 2013-09-05 DIAGNOSIS — Z87448 Personal history of other diseases of urinary system: Secondary | ICD-10-CM | POA: Insufficient documentation

## 2013-09-05 DIAGNOSIS — Z7982 Long term (current) use of aspirin: Secondary | ICD-10-CM | POA: Insufficient documentation

## 2013-09-05 DIAGNOSIS — Z79899 Other long term (current) drug therapy: Secondary | ICD-10-CM | POA: Insufficient documentation

## 2013-09-05 DIAGNOSIS — Z860100 Personal history of colon polyps, unspecified: Secondary | ICD-10-CM | POA: Insufficient documentation

## 2013-09-05 DIAGNOSIS — I4891 Unspecified atrial fibrillation: Secondary | ICD-10-CM | POA: Insufficient documentation

## 2013-09-05 DIAGNOSIS — R079 Chest pain, unspecified: Secondary | ICD-10-CM | POA: Insufficient documentation

## 2013-09-05 DIAGNOSIS — K219 Gastro-esophageal reflux disease without esophagitis: Secondary | ICD-10-CM | POA: Insufficient documentation

## 2013-09-05 DIAGNOSIS — I10 Essential (primary) hypertension: Secondary | ICD-10-CM | POA: Insufficient documentation

## 2013-09-05 DIAGNOSIS — E7800 Pure hypercholesterolemia, unspecified: Secondary | ICD-10-CM | POA: Insufficient documentation

## 2013-09-05 LAB — URINALYSIS, ROUTINE W REFLEX MICROSCOPIC
BILIRUBIN URINE: NEGATIVE
Glucose, UA: NEGATIVE mg/dL
HGB URINE DIPSTICK: NEGATIVE
Ketones, ur: NEGATIVE mg/dL
Leukocytes, UA: NEGATIVE
Nitrite: NEGATIVE
Protein, ur: NEGATIVE mg/dL
Specific Gravity, Urine: 1.01 (ref 1.005–1.030)
UROBILINOGEN UA: 0.2 mg/dL (ref 0.0–1.0)
pH: 6.5 (ref 5.0–8.0)

## 2013-09-05 LAB — CBC
HCT: 42.7 % (ref 39.0–52.0)
Hemoglobin: 15.1 g/dL (ref 13.0–17.0)
MCH: 32.7 pg (ref 26.0–34.0)
MCHC: 35.4 g/dL (ref 30.0–36.0)
MCV: 92.4 fL (ref 78.0–100.0)
PLATELETS: 253 10*3/uL (ref 150–400)
RBC: 4.62 MIL/uL (ref 4.22–5.81)
RDW: 13.4 % (ref 11.5–15.5)
WBC: 8.6 10*3/uL (ref 4.0–10.5)

## 2013-09-05 LAB — COMPREHENSIVE METABOLIC PANEL
ALBUMIN: 4.5 g/dL (ref 3.5–5.2)
ALT: 21 U/L (ref 0–53)
AST: 23 U/L (ref 0–37)
Alkaline Phosphatase: 70 U/L (ref 39–117)
BUN: 20 mg/dL (ref 6–23)
CO2: 26 mEq/L (ref 19–32)
CREATININE: 1.09 mg/dL (ref 0.50–1.35)
Calcium: 9.8 mg/dL (ref 8.4–10.5)
Chloride: 99 mEq/L (ref 96–112)
GFR calc Af Amer: 82 mL/min — ABNORMAL LOW (ref >90)
GFR, EST NON AFRICAN AMERICAN: 71 mL/min — AB (ref >90)
Glucose, Bld: 132 mg/dL — ABNORMAL HIGH (ref 70–99)
Potassium: 5.1 mEq/L (ref 3.7–5.3)
Sodium: 137 mEq/L (ref 137–147)
Total Bilirubin: 0.7 mg/dL (ref 0.3–1.2)
Total Protein: 7.1 g/dL (ref 6.0–8.3)

## 2013-09-05 LAB — TROPONIN I: Troponin I: <0.3 ng/mL (ref <0.30)

## 2013-09-05 LAB — I-STAT TROPONIN, ED: Troponin i, poc: 0.01 ng/mL (ref 0.00–0.08)

## 2013-09-05 LAB — PROTIME-INR
INR: 1.04 (ref 0.00–1.49)
PROTHROMBIN TIME: 13.4 s (ref 11.6–15.2)

## 2013-09-05 MED ORDER — METOPROLOL TARTRATE 25 MG PO TABS: 12.5000 mg | ORAL_TABLET | Freq: Two times a day (BID) | ORAL | Status: DC

## 2013-09-05 MED ORDER — SODIUM CHLORIDE 0.9 % IV BOLUS (SEPSIS)
1000.0000 mL | INTRAVENOUS | Status: AC
  Administered 2013-09-05: 1000 mL via INTRAVENOUS

## 2013-09-05 NOTE — ED Notes (Signed)
MD at bedside. (Dr. Harrison) 

## 2013-09-05 NOTE — ED Notes (Signed)
He was at work today and felt his heart start racing. He has hx of ablation, svt and afib. Denies pain

## 2013-09-05 NOTE — ED Provider Notes (Signed)
CSN: [id number]     Arrival date & time 09/05/13  8676 History   First MD Initiated Contact with Patient 09/05/13 1007     Chief Complaint  Patient presents with  . Tachycardia     (Consider location/radiation/quality/duration/timing/severity/associated sxs/prior Treatment) Patient is a 63 y.o. male presenting with palpitations. The history is provided by the patient.  Palpitations Palpitations quality:  Irregular Onset quality:  Sudden Duration: 1.5 hrs. Timing:  Constant Progression:  Unchanged Chronicity:  Recurrent Context comment:  Hx of SVT, a flutter, had 3 cups of coffee this morning Relieved by:  Nothing Worsened by:  Nothing tried Ineffective treatments:  None tried Associated symptoms: chest pain and nausea   Associated symptoms: no cough, no numbness, no shortness of breath and no vomiting   Chest pain:    Quality:  Unable to specify   Severity:  Mild   Onset quality:  Sudden   Duration: 1.5 hrs.   Timing:  Constant   Progression:  Unchanged   Chronicity:  New   Past Medical History  Diagnosis Date  . Adenomatous colon polyp   . Palpitations     probable SVT, rate 170,, at home,, lasted one hour,, 2012  . Hypercholesterolemia   . GERD (gastroesophageal reflux disease)   . Alcohol use   . Hypertension   . Raynaud's syndrome   . TIA (transient ischemic attack)     Possible small vessel tia in the past.  . CRAO (central retinal artery occlusion)   . BPH (benign prostatic hypertrophy)   . Carotid artery disease     7-20% R. ICA, 94-70% LICA... Doppler.... January, 2010  /  Doppler... in January, 2011 no change  /  doppler1/27/2012...stable  9-62% RICA 83-66% LICA  . Tobacco abuse   . Antiphospholipid antibody positive     ???In the past???but not proven according to patient  . Ejection fraction      EF 50-55%, echo, March, 2012... inferobasal and distal septal hypokinesis /      60%, echo, November, 2008  . CAD (coronary artery disease)      Catheterization 2005 disease / catheterization July 06, 2010, chronic total occlusion distal RCA with collaterals from right and left side.  Medical therapy recommended, mild decreased LV function       . Antiphospholipid antibody positive     ??? in the past, but not proven according to the patient.  . Atrial flutter     atrial flutter ablation,   Dr.Allred October 18, 2010  . Myocardial infarction    Past Surgical History  Procedure Laterality Date  . Cardiac catheterization  06/27/2003    2005.... mild irregularity of the LAD..( patient had had abnormal Myoview scan)  . Inguinal hernia repair  2009    right with mesh.  Dr Marlou Starks  . Thyroglossal duct cyst  2006    Dr. Wilburn Cornelia   History reviewed. No pertinent family history. History  Substance Use Topics  . Smoking status: Current Every Day Smoker -- 0.50 packs/day for 40 years    Types: Cigarettes  . Smokeless tobacco: Never Used     Comment: 1 pack a day  . Alcohol Use: 12.6 oz/week    21 Cans of beer per week     Comment: he continues to drink heavily despite my recommendation to quit    Review of Systems  Constitutional: Negative for fever.  HENT: Negative for drooling and rhinorrhea.   Eyes: Negative for pain.  Respiratory: Negative for cough  and shortness of breath.   Cardiovascular: Positive for chest pain and palpitations. Negative for leg swelling.  Gastrointestinal: Positive for nausea. Negative for vomiting, abdominal pain and diarrhea.  Genitourinary: Negative for dysuria and hematuria.  Musculoskeletal: Negative for gait problem and neck pain.  Skin: Negative for color change.  Neurological: Positive for weakness (unsual sensation in left arm onset w/ palpitations). Negative for numbness and headaches.  Hematological: Negative for adenopathy.  Psychiatric/Behavioral: Negative for behavioral problems.  All other systems reviewed and are negative.     Allergies  Cialis; Levitra; and Viagra  Home Medications    Prior to Admission medications   Medication Sig Start Date End Date Taking? Authorizing Provider  aspirin 81 MG tablet Take 81 mg by mouth daily.      Historical Provider, MD  atorvastatin (LIPITOR) 40 MG tablet Take 1 tablet (40 mg total) by mouth daily. 08/22/13   Rise Mu, PA-C  calcium carbonate (TUMS - DOSED IN MG ELEMENTAL CALCIUM) 500 MG chewable tablet Chew 1 tablet by mouth as needed.      Historical Provider, MD  lisinopril (PRINIVIL,ZESTRIL) 20 MG tablet Take 1 tablet (20 mg total) by mouth daily. 08/22/13   Ryan M Dunn, PA-C   BP 131/80  Pulse 151  Temp(Src) 98.3 F (36.8 C) (Oral)  Resp 11  SpO2 99% Physical Exam  Nursing note and vitals reviewed. Constitutional: He is oriented to person, place, and time. He appears well-developed and well-nourished.  HENT:  Head: Normocephalic and atraumatic.  Right Ear: External ear normal.  Left Ear: External ear normal.  Nose: Nose normal.  Mouth/Throat: Oropharynx is clear and moist. No oropharyngeal exudate.  Eyes: Conjunctivae and EOM are normal. Pupils are equal, round, and reactive to light.  Neck: Normal range of motion. Neck supple.  Cardiovascular: Normal rate, regular rhythm, normal heart sounds and intact distal pulses.  Exam reveals no gallop and no friction rub.   No murmur heard. Pulmonary/Chest: Effort normal and breath sounds normal. No respiratory distress. He has no wheezes.  Abdominal: Soft. Bowel sounds are normal. He exhibits no distension. There is no tenderness. There is no rebound and no guarding.  Musculoskeletal: Normal range of motion. He exhibits no edema and no tenderness.  Neurological: He is alert and oriented to person, place, and time.  alert, oriented x3 speech: normal in context and clarity memory: intact grossly cranial nerves II-XII: intact motor strength: full proximally and distally no involuntary movements or tremors sensation: intact to light touch diffusely  cerebellar: finger-to-nose  intact    Skin: Skin is warm and dry.  Psychiatric: He has a normal mood and affect. His behavior is normal.    ED Course  Procedures (including critical care time) Labs Review Labs Reviewed  COMPREHENSIVE METABOLIC PANEL - Abnormal; Notable for the following:    Glucose, Bld 132 (*)    GFR calc non Af Amer 71 (*)    GFR calc Af Amer 82 (*)    All other components within normal limits  CBC  TROPONIN I  PROTIME-INR  URINALYSIS, ROUTINE W REFLEX MICROSCOPIC  I-STAT TROPOININ, ED    Imaging Review Dg Chest Port 1 View  09/05/2013   CLINICAL DATA:  Chest pain.  Tachycardia.  EXAM: PORTABLE CHEST - 1 VIEW  COMPARISON:  08/31/2010  FINDINGS: The cardiomediastinal silhouette is within normal limits. The lungs remain hyperinflated. There is no evidence of airspace consolidation, pleural effusion, or pneumothorax. No acute osseous abnormality is identified.  IMPRESSION: No  active disease.   Electronically Signed   By: Logan Bores   On: 09/05/2013 10:46     EKG Interpretation   Date/Time:  Monday September 05 2013 10:43:51 EDT Ventricular Rate:  89 PR Interval:  127 QRS Duration: 95 QT Interval:  334 QTC Calculation: 406 R Axis:   86 Text Interpretation:  Sinus rhythm Multiple premature complexes, vent   Biatrial enlargement Borderline right axis deviation Borderline  repolarization abnormality Minimal ST elevation, anterior leads No  significant change since last tracing Confirmed by HARRISON  MD, FORREST  ([id number]) on 09/05/2013 11:23:15 AM      MDM   Final diagnoses:  Atrial fibrillation with RVR    10:26 AM 63 y.o. male w a hx of CAD, a flutter and SVT s/p ablation who presents with palpitations which began at approximately 9 AM this morning. He states that he did have 3 small cups of coffee this morning and had been walking. He states that he was standing still and he palpitations began abruptly. He also had some mild chest discomfort at that time. He is afebrile with a heart  rate in the 160s on initial exam. He denies any shortness of breath. EKG appears to show atrial fibrillation with RVR. Narrow complex. No evidence of preexcitation syndrome on previous EKGs. While examining him his heart rate decreased to the 90s and seemed to stay there. He appears to have spontaneously converted. Will get screening labwork, IV fluids, and monitor on telemetry.  Pt has remained in NSR. Discussed w/ Dr. Johnsie Cancel, will start on low dose metoprolol bid until he can f/u w/ Dr. Ron Parker.   1:11 PM: I interpreted/reviewed the labs and/or imaging which were non-contributory.  Pt continues to appear well, remains asx.  I have discussed the diagnosis/risks/treatment options with the patient and family and believe the pt to be eligible for discharge home to follow-up with Dr. Ron Parker, call for appt. We also discussed returning to the ED immediately if new or worsening sx occur. We discussed the sx which are most concerning (e.g., return of palpitations, sob, cp) that necessitate immediate return. Medications administered to the patient during their visit and any new prescriptions provided to the patient are listed below.  Medications given during this visit Medications  sodium chloride 0.9 % bolus 1,000 mL (0 mLs Intravenous Stopped 09/05/13 1214)    Discharge Medication List as of 09/05/2013  1:12 PM    START taking these medications   Details  metoprolol tartrate (LOPRESSOR) 25 MG tablet Take 0.5 tablets (12.5 mg total) by mouth 2 (two) times daily., Starting 09/05/2013, Until Discontinued, Print         Blanchard Kelch, MD 09/05/13 [phone]

## 2013-09-05 NOTE — Discharge Instructions (Signed)
Atrial Fibrillation  Atrial fibrillation is a condition that causes your heart to beat irregularly. It may also cause your heart to beat faster than normal. Atrial fibrillation can prevent your heart from pumping blood normally. It increases your risk of stroke and heart problems.  HOME CARE  · Take medications as told by your doctor.  · Only take medications that your doctor says are safe. Some medications can make the condition worse or happen again.  · If blood thinners were prescribed by your doctor, take them exactly as told. Too much can cause bleeding. Too little and you will not have the needed protection against stroke and other problems.  · Perform blood tests at home if told by your doctor.  · Perform blood tests exactly as told by your doctor.  · Do not drink alcohol.  · Do not drink beverages with caffeine such as coffee, soda, and some teas.  · Maintain a healthy weight.  · Do not use diet pills unless your doctor says they are safe. They may make heart problems worse.  · Follow diet instructions as told by your doctor.  · Exercise regularly as told by your doctor.  · Keep all follow-up appointments.  GET HELP RIGHT AWAY IF:   · You have chest or belly (abdominal) pain.  · You feel sick to your stomach (nauseous)  · You suddenly have swollen feet and ankles.  · You feel dizzy.  · You face, arms, or legs feel numb or weak.  · There is a change in your vision or speech.  · You notice a change in the speed, rhythm, or strength of your heartbeat.  · You suddenly begin peeing (urinating) more often.  · You get tired more easily when moving or exercising.  MAKE SURE YOU:   · Understand these instructions.  · Will watch your condition.  · Will get help right away if you are not doing well or get worse.  Document Released: 12/11/2007 Document Revised: 06/28/2012 Document Reviewed: 04/13/2012  [employer]® Patient Information ©2015 [employer]. This information is not intended to replace advice given to you by  your health care provider. Make sure you discuss any questions you have with your health care provider.

## 2013-10-21 ENCOUNTER — Ambulatory Visit (INDEPENDENT_AMBULATORY_CARE_PROVIDER_SITE_OTHER): Payer: 59 | Admitting: Cardiology

## 2013-10-21 ENCOUNTER — Encounter: Payer: Self-pay | Admitting: Cardiology

## 2013-10-21 VITALS — BP 117/64 | HR 75 | Ht 74.0 in | Wt 154.4 lb

## 2013-10-21 DIAGNOSIS — I739 Peripheral vascular disease, unspecified: Secondary | ICD-10-CM

## 2013-10-21 DIAGNOSIS — I498 Other specified cardiac arrhythmias: Secondary | ICD-10-CM

## 2013-10-21 DIAGNOSIS — I10 Essential (primary) hypertension: Secondary | ICD-10-CM

## 2013-10-21 DIAGNOSIS — I4892 Unspecified atrial flutter: Secondary | ICD-10-CM

## 2013-10-21 DIAGNOSIS — I779 Disorder of arteries and arterioles, unspecified: Secondary | ICD-10-CM

## 2013-10-21 DIAGNOSIS — F17200 Nicotine dependence, unspecified, uncomplicated: Secondary | ICD-10-CM

## 2013-10-21 DIAGNOSIS — I471 Supraventricular tachycardia: Secondary | ICD-10-CM

## 2013-10-21 DIAGNOSIS — I2510 Atherosclerotic heart disease of native coronary artery without angina pectoris: Secondary | ICD-10-CM

## 2013-10-21 LAB — TSH: TSH: 0.42 u[IU]/mL (ref 0.35–4.50)

## 2013-10-21 MED ORDER — METOPROLOL TARTRATE 25 MG PO TABS: 12.5000 mg | ORAL_TABLET | Freq: Two times a day (BID) | ORAL | Status: DC

## 2013-10-21 MED ORDER — METOPROLOL SUCCINATE ER 25 MG PO TB24: 25.0000 mg | ORAL_TABLET | Freq: Every day | ORAL | Status: DC

## 2013-10-21 NOTE — Progress Notes (Signed)
Patient ID: [name], male   DOB: 02-May-1950, 63 y.o.   MRN: [id number]    HPI  Patient is seen today to followup his atrial arrhythmias. I saw him last in February, 2015. Since that time he's had some increased palpitations. He was seen in the emergency room on September 05, 2013. I have reviewed all the emergency room data carefully including labs and notes. It seems that his fast heart rate reverted before he got to the emergency room. Low dose of beta blocker was added to his medications. Since that time he's had some palpitations.  Allergies  Allergen Reactions  . Cialis [Tadalafil]   . Levitra [Vardenafil]   . Viagra [Sildenafil Citrate]     Current Outpatient Prescriptions  Medication Sig Dispense Refill  . aspirin 81 MG tablet Take 81 mg by mouth daily.        Marland Kitchen atorvastatin (LIPITOR) 40 MG tablet Take 1 tablet (40 mg total) by mouth daily.  30 tablet  5  . calcium carbonate (TUMS - DOSED IN MG ELEMENTAL CALCIUM) 500 MG chewable tablet Chew 1 tablet by mouth as needed.        Marland Kitchen lisinopril (PRINIVIL,ZESTRIL) 10 MG tablet Take 10 mg by mouth daily.       No current facility-administered medications for this visit.    History   Social History  . Marital Status: Married    Spouse Name: N/A    Number of Children: N/A  . 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 Smoking status: Current Every Day Smoker -- 0.50 packs/day for 40 years    Types: Cigarettes  . Smokeless tobacco: Never Used     Comment: 1 pack a day  . Alcohol Use: 12.6 oz/week    21 Cans of beer per week     Comment: he continues to drink heavily despite my recommendation to quit  . Drug Use: No  . Sexual Activity: Not on file   Other Topics Concern  . Not on file   Social History Narrative  . No narrative on file    No family history on file.  Past Medical History  Diagnosis Date  . Adenomatous colon polyp   . Palpitations     probable SVT,  rate 170,, at home,, lasted one hour,, 2012  . Hypercholesterolemia   . GERD (gastroesophageal reflux disease)   . Alcohol use   . Hypertension   . Raynaud's syndrome   . TIA (transient ischemic attack)     Possible small vessel tia in the past.  . CRAO (central retinal artery occlusion)   . BPH (benign prostatic hypertrophy)   . Carotid artery disease     2-83% R. ICA, 15-17% LICA... Doppler.... January, 2010  /  Doppler... in January, 2011 no change  /  doppler1/27/2012...stable  6-16% RICA 07-37% LICA  . Tobacco abuse   . Antiphospholipid antibody positive     ???In the past???but not proven according to patient  . Ejection fraction      EF 50-55%, echo, March, 2012... inferobasal and distal septal hypokinesis /      60%, echo, November, 2008  . CAD (coronary artery disease)     Catheterization 2005 disease / catheterization July 06, 2010, chronic total occlusion distal RCA with collaterals from right and left side.  Medical therapy recommended, mild decreased LV function       . Antiphospholipid antibody positive     ???  in the past, but not proven according to the patient.  . Atrial flutter     atrial flutter ablation,   Dr.Allred October 18, 2010  . Myocardial infarction     Past Surgical History  Procedure Laterality Date  . Cardiac catheterization  06/27/2003    2005.... mild irregularity of the LAD..( patient had had abnormal Myoview scan)  . Inguinal hernia repair  2009    right with mesh.  Dr Marlou Starks  . Thyroglossal duct cyst  2006    Dr. Wilburn Cornelia    Patient Active Problem List   Diagnosis Date Noted  . Palpitations     Priority: High  . SVT (supraventricular tachycardia)     Priority: High  . CAD (coronary artery disease)     Priority: High  . Ejection fraction     Priority: High  . 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 Atrial flutter   . Atrial flutter 09/05/2010  . Encounter for long-term (current) use of anticoagulants 09/05/2010  . Hypertension   .  Raynaud's syndrome   . TIA (transient ischemic attack)   . CRAO (central retinal artery occlusion)   . Carotid artery disease   . ABNORMAL ELECTROCARDIOGRAM 05/10/2010  . PERSONAL HISTORY OF COLONIC POLYPS 12/05/2009  . TOBACCO ABUSE 04/12/2009  . INGUINAL HERNIA, LEFT 12/19/2008  . BENIGN PROSTATIC HYPERTROPHY 12/19/2008  . ADENOMATOUS COLONIC POLYP 07/05/2007  . THYROID CYST 07/05/2007  . HYPERCHOLESTEROLEMIA 07/05/2007  . ALCOHOL USE 07/05/2007  . CENTRAL RETINAL ARTERY OCCLUSION 07/05/2007  . HYPERTENSION 07/05/2007  . GERD 07/05/2007    ROS   Patient denies fever, chills, headache, sweats, rash, change in vision, change in hearing, chest pain, cough, nausea vomiting, urinary symptoms. All other systems are reviewed and are negative.  PHYSICAL EXAM  Patient is oriented to person time and place. Affect is normal. He's here with his wife today. Head is atraumatic. Sclera and conjunctiva are normal. He has a reddish tint to his skin that is a chronic finding. Lungs are clear. Respiratory effort is nonlabored. There is no jugulovenous distention. Cardiac exam reveals an S1 and S2. Abdomen is soft. There is no peripheral edema. There no musculoskeletal deformities. There are dose in rashes other than the reddish tint to his skin that is chronic.  Filed Vitals:   10/21/13 0826  BP: 117/64  Pulse: 75  Height: 6\' 2"  (1.88 m)  Weight: 154 lb 6.4 oz (70.035 kg)    I have reviewed EKGs from his emergency room visit on June 22. There was sinus rhythm.  ASSESSMENT & PLAN

## 2013-10-21 NOTE — Assessment & Plan Note (Addendum)
The patient had atrial flutter ablation in August, 2012. In the past there have been questions of other supraventricular arrhythmias. He's had palpitations recently with episodes of increased heart rate. When he was seen in the emergency room he converted back to his sinus rhythm. He says that he is continuing to have some palpitations daily. He is taking a small dose of metoprolol. He has a history of Raynaud's phenomenon. We cannot use higher dose beta blockers. In the past I have had a mono small dose of diltiazem. He and his wife are going to the beach for a week. He will take the metoprolol at 25 mg daily. If he has an increased episode he will take a second dose. I'm also going to give him a prescription to carry. He will fill this only if he has increased prolonged symptoms while away. It will be for diltiazem 120 mg long-acting daily. We will arrange for a 48-hour monitor when he returns home. I will then see him back for followup and we'll make further decision his care. TSH will be checked.  As part of today's evaluation I spent greater than 25 minutes with his total care. More than half of this time is been with direct contact with the patient and his wife reviewing the history and talking about all the plans.

## 2013-10-21 NOTE — Assessment & Plan Note (Signed)
He has known carotid disease that is moderate. It is followed carefully.

## 2013-10-21 NOTE — Assessment & Plan Note (Signed)
Coronary disease is stable. There is a chronic total occlusion of his RCA with collaterals from both the right and the left. Medical therapy has been recommended over time. No further workup at this time.

## 2013-10-21 NOTE — Assessment & Plan Note (Signed)
I have counseled him to stop smoking. 

## 2013-10-21 NOTE — Assessment & Plan Note (Signed)
Blood pressures control. No change in therapy. 

## 2013-10-21 NOTE — Patient Instructions (Addendum)
Your physician has recommended you make the following change in your medication: change Lopressor to Toprol 25 mg daily  Your physician has recommended that you wear a 48 hour holter monitor. Holter monitors are medical devices that record the heart's electrical activity. Doctors most often use these monitors to diagnose arrhythmias. Arrhythmias are problems with the speed or rhythm of the heartbeat. The monitor is a small, portable device. You can wear one while you do your normal daily activities. This is usually used to diagnose what is causing palpitations/syncope (passing out).  Your physician recommends that you return for lab work in: today  Your physician recommends that you schedule a follow-up appointment in: 3 weeks

## 2013-11-01 ENCOUNTER — Encounter: Payer: Self-pay | Admitting: *Deleted

## 2013-11-01 ENCOUNTER — Encounter (INDEPENDENT_AMBULATORY_CARE_PROVIDER_SITE_OTHER): Payer: 59

## 2013-11-01 DIAGNOSIS — I498 Other specified cardiac arrhythmias: Secondary | ICD-10-CM

## 2013-11-01 DIAGNOSIS — I471 Supraventricular tachycardia: Secondary | ICD-10-CM

## 2013-11-01 DIAGNOSIS — I4892 Unspecified atrial flutter: Secondary | ICD-10-CM

## 2013-11-01 NOTE — Progress Notes (Signed)
Patient ID: [name], male   DOB: 1950-08-29, 63 y.o.   MRN: [id number] EVO 48 hour holter monitor applied to patient.

## 2013-11-14 ENCOUNTER — Ambulatory Visit (INDEPENDENT_AMBULATORY_CARE_PROVIDER_SITE_OTHER): Payer: 59 | Admitting: Cardiology

## 2013-11-14 ENCOUNTER — Encounter: Payer: Self-pay | Admitting: Cardiology

## 2013-11-14 VITALS — BP 142/68 | HR 61 | Ht 74.0 in | Wt 156.0 lb

## 2013-11-14 DIAGNOSIS — I472 Ventricular tachycardia: Secondary | ICD-10-CM | POA: Insufficient documentation

## 2013-11-14 DIAGNOSIS — I4892 Unspecified atrial flutter: Secondary | ICD-10-CM

## 2013-11-14 DIAGNOSIS — IMO0002 Reserved for concepts with insufficient information to code with codable children: Secondary | ICD-10-CM

## 2013-11-14 DIAGNOSIS — Z7901 Long term (current) use of anticoagulants: Secondary | ICD-10-CM

## 2013-11-14 DIAGNOSIS — R0989 Other specified symptoms and signs involving the circulatory and respiratory systems: Secondary | ICD-10-CM

## 2013-11-14 DIAGNOSIS — I2510 Atherosclerotic heart disease of native coronary artery without angina pectoris: Secondary | ICD-10-CM

## 2013-11-14 DIAGNOSIS — I4729 Other ventricular tachycardia: Secondary | ICD-10-CM | POA: Insufficient documentation

## 2013-11-14 DIAGNOSIS — R9430 Abnormal result of cardiovascular function study, unspecified: Secondary | ICD-10-CM

## 2013-11-14 NOTE — Assessment & Plan Note (Signed)
He has return of symptomatic atrial flutter. This is documented on the 48 hour monitor. I am referring him back to Dr. Rayann Heman for further evaluation. I suspect that additional ablation may be considered.

## 2013-11-14 NOTE — Patient Instructions (Signed)
Your physician recommends that you continue on your current medications as directed. Please refer to the Current Medication list given to you today. Please call your insurance company to find out which is less expensive for you you take then call [facility] (DR Kae Heller nurse) at [phone] to let us know.  Your physician has requested that you have an echocardiogram. Echocardiography is a painless test that uses sound waves to create images of your heart. It provides your doctor with information about the size and shape of your heart and how well your heart's chambers and valves are working. This procedure takes approximately one hour. There are no restrictions for this procedure.   Your physician has requested that you have en exercise stress myoview. For further information please visit [email or web address]. Please follow instruction sheet, as given.  Your physician recommends that you schedule a follow-up appointment in: soon with Dr Rayann Heman to re assess return of atrial flutter and in 3 months with Dr Lovena Le.

## 2013-11-14 NOTE — Assessment & Plan Note (Signed)
Patient had 4 beats of ventricular tachycardia with a rate of 160. We need to rule out ongoing ischemia at this time. Stress nuclear scan will be done. The left ventricle function will also be carefully reassessed by echo.

## 2013-11-14 NOTE — Progress Notes (Signed)
Patient ID: [name], male   DOB: [birth year], 63 y.o.   MRN: [id number]    HPI  Patient is seen today to followup palpitations, atrial flutter, coronary artery disease. I saw him last on October 21, 2013. At that time I outlined that he was having return of palpitations. 48 hour monitor was arranged. I have reviewed the images and created the report. The patient had scattered ventricular ectopy. He did have one 4 beat run of ventricular tachycardia at a rate of 160. There were no documented symptoms. He did have 2 episodes of feeling "irregular heartbeat. With both of these, he was going in and out of atrial flutter with an increased rate. These both occurred with him sitting at rest. He and his wife are now here for followup. TSH was also checked at the time of the last visit. He was in the normal range.  Allergies  Allergen Reactions  . Cialis [Tadalafil]   . Levitra [Vardenafil]   . Viagra [Sildenafil Citrate]     Current Outpatient Prescriptions  Medication Sig Dispense Refill  . aspirin 81 MG tablet Take 81 mg by mouth daily.        Marland Kitchen atorvastatin (LIPITOR) 40 MG tablet Take 1 tablet (40 mg total) by mouth daily.  30 tablet  5  . calcium carbonate (TUMS - DOSED IN MG ELEMENTAL CALCIUM) 500 MG chewable tablet Chew 1 tablet by mouth as needed.        Marland Kitchen lisinopril (PRINIVIL,ZESTRIL) 10 MG tablet Take 10 mg by mouth daily.      . metoprolol succinate (TOPROL XL) 25 MG 24 hr tablet Take 1 tablet (25 mg total) by mouth daily.  30 tablet  6  . CARTIA XT 120 MG 24 hr capsule Take 120 mg by mouth daily.        No current facility-administered medications for this visit.    History   Social History  . Marital Status: Married    Spouse Name: N/A    Number of Children: N/A  . 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 Smoking status: Current Every Day Smoker -- 0.50 packs/day for 40 years    Types: Cigarettes  . Smokeless  tobacco: Never Used     Comment: 1 pack a day  . Alcohol Use: 12.6 oz/week    21 Cans of beer per week     Comment: he continues to drink heavily despite my recommendation to quit  . Drug Use: No  . Sexual Activity: Not on file   Other Topics Concern  . Not on file   Social History Narrative  . No narrative on file    No family history on file.  Past Medical History  Diagnosis Date  . Adenomatous colon polyp   . Palpitations     probable SVT, rate 170,, at home,, lasted one hour,, 2012  . Hypercholesterolemia   . GERD (gastroesophageal reflux disease)   . Alcohol use   . Hypertension   . Raynaud's syndrome   . TIA (transient ischemic attack)     Possible small vessel tia in the past.  . CRAO (central retinal artery occlusion)   . BPH (benign prostatic hypertrophy)   . Carotid artery disease     2-02% R. ICA, 54-27% LICA... Doppler.... January, 2010  /  Doppler... in January, 2011 no change  /  doppler1/27/2012...stable  0-62% RICA 37-62% LICA  .  Tobacco abuse   . Antiphospholipid antibody positive     ???In the past???but not proven according to patient  . Ejection fraction      EF 50-55%, echo, March, 2012... inferobasal and distal septal hypokinesis /      60%, echo, November, 2008  . CAD (coronary artery disease)     Catheterization 2005 disease / catheterization July 06, 2010, chronic total occlusion distal RCA with collaterals from right and left side.  Medical therapy recommended, mild decreased LV function       . Antiphospholipid antibody positive     ??? in the past, but not proven according to the patient.  . Atrial flutter     atrial flutter ablation,   Dr.Allred October 18, 2010  . Myocardial infarction   . Nonsustained ventricular tachycardia     Past Surgical History  Procedure Laterality Date  . Cardiac catheterization  06/27/2003    2005.... mild irregularity of the LAD..( patient had had abnormal Myoview scan)  . Inguinal hernia repair  2009     right with mesh.  Dr Marlou Starks  . Thyroglossal duct cyst  2006    Dr. Wilburn Cornelia    Patient Active Problem List   Diagnosis Date Noted  . Palpitations     Priority: High  . SVT (supraventricular tachycardia)     Priority: High  . CAD (coronary artery disease)     Priority: High  . Ejection fraction     Priority: High  . Nonsustained ventricular tachycardia   . 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 Atrial flutter   . Encounter for long-term (current) use of anticoagulants 09/05/2010  . Raynaud's syndrome   . TIA (transient ischemic attack)   . CRAO (central retinal artery occlusion)   . Carotid artery disease   . ABNORMAL ELECTROCARDIOGRAM 05/10/2010  . PERSONAL HISTORY OF COLONIC POLYPS 12/05/2009  . TOBACCO ABUSE 04/12/2009  . INGUINAL HERNIA, LEFT 12/19/2008  . BENIGN PROSTATIC HYPERTROPHY 12/19/2008  . ADENOMATOUS COLONIC POLYP 07/05/2007  . THYROID CYST 07/05/2007  . HYPERCHOLESTEROLEMIA 07/05/2007  . ALCOHOL USE 07/05/2007  . CENTRAL RETINAL ARTERY OCCLUSION 07/05/2007  . HYPERTENSION 07/05/2007  . GERD 07/05/2007    ROS   Patient denies fever, chills, headache, sweats, rash, change in vision, change in hearing, chest pain, cough, nausea vomiting, urinary symptoms. All other systems are reviewed and are negative.  PHYSICAL EXAM  Patient is oriented to person time and place. Affect is normal. He is here with his wife. Head is atraumatic. Sclera and conjunctiva are normal. There is no jugulovenous distention. Lungs are clear. Respiratory effort is nonlabored. Cardiac exam reveals S1 and S2. The rhythm is regular. The abdomen is soft. There is no peripheral edema. There no musculoskeletal deformities. He has a reddish tint to his skin in this is an old finding.  Filed Vitals:   11/14/13 0943  BP: 142/68  Pulse: 61  Height: 6\' 2"  (1.88 m)  Weight: 156 lb (70.761 kg)  SpO2: 97%     ASSESSMENT & PLAN

## 2013-11-14 NOTE — Assessment & Plan Note (Signed)
Patient has known coronary disease. His last cath was April, 2012. He had chronic total occlusion of the distal RCA with collaterals from right to left. Because he has 4 beats of ventricular tachycardia, we need to proceed with a followup stress nuclear scan.

## 2013-11-14 NOTE — Assessment & Plan Note (Signed)
The patient took Pradaxa in 2012 around the time of his flutter ablation. Anticoagulant will be restarted at this time. We will finalize the choice of medication.

## 2013-11-14 NOTE — Assessment & Plan Note (Signed)
His last echo was March, 2012. He did have some decrease in LV function compared to the past with an EF in the 50-55% range. Because of this 4 beats of ventricular tachycardia, two-dimensional echo will also be needed to carefully reassess LV function.

## 2013-11-16 ENCOUNTER — Encounter: Payer: Self-pay | Admitting: Cardiology

## 2013-11-17 ENCOUNTER — Telehealth: Payer: Self-pay | Admitting: Cardiology

## 2013-11-17 MED ORDER — DABIGATRAN ETEXILATE MESYLATE 150 MG PO CAPS: 150.0000 mg | ORAL_CAPSULE | Freq: Two times a day (BID) | ORAL | Status: DC

## 2013-11-17 NOTE — Telephone Encounter (Signed)
New message     Want the doctor to know that prodaxa is the cheapest.  Please call in rx to [facility]

## 2013-11-17 NOTE — Telephone Encounter (Signed)
Rx sent to pharmacy. Message left on patient's voicemail.

## 2013-11-24 ENCOUNTER — Ambulatory Visit (HOSPITAL_COMMUNITY): Payer: 59 | Attending: Cardiovascular Disease | Admitting: Radiology

## 2013-11-24 ENCOUNTER — Ambulatory Visit (HOSPITAL_BASED_OUTPATIENT_CLINIC_OR_DEPARTMENT_OTHER): Payer: 59 | Admitting: Radiology

## 2013-11-24 VITALS — BP 143/67 | HR 52 | Ht 74.0 in | Wt 152.0 lb

## 2013-11-24 DIAGNOSIS — F17200 Nicotine dependence, unspecified, uncomplicated: Secondary | ICD-10-CM | POA: Insufficient documentation

## 2013-11-24 DIAGNOSIS — I498 Other specified cardiac arrhythmias: Secondary | ICD-10-CM | POA: Insufficient documentation

## 2013-11-24 DIAGNOSIS — I4729 Other ventricular tachycardia: Secondary | ICD-10-CM | POA: Diagnosis not present

## 2013-11-24 DIAGNOSIS — R002 Palpitations: Secondary | ICD-10-CM | POA: Diagnosis not present

## 2013-11-24 DIAGNOSIS — R0989 Other specified symptoms and signs involving the circulatory and respiratory systems: Secondary | ICD-10-CM | POA: Insufficient documentation

## 2013-11-24 DIAGNOSIS — I252 Old myocardial infarction: Secondary | ICD-10-CM | POA: Diagnosis not present

## 2013-11-24 DIAGNOSIS — R9430 Abnormal result of cardiovascular function study, unspecified: Secondary | ICD-10-CM

## 2013-11-24 DIAGNOSIS — I472 Ventricular tachycardia, unspecified: Secondary | ICD-10-CM | POA: Insufficient documentation

## 2013-11-24 DIAGNOSIS — I2510 Atherosclerotic heart disease of native coronary artery without angina pectoris: Secondary | ICD-10-CM

## 2013-11-24 DIAGNOSIS — R0602 Shortness of breath: Secondary | ICD-10-CM

## 2013-11-24 DIAGNOSIS — I6529 Occlusion and stenosis of unspecified carotid artery: Secondary | ICD-10-CM | POA: Diagnosis not present

## 2013-11-24 DIAGNOSIS — I10 Essential (primary) hypertension: Secondary | ICD-10-CM | POA: Diagnosis not present

## 2013-11-24 DIAGNOSIS — E7800 Pure hypercholesterolemia, unspecified: Secondary | ICD-10-CM | POA: Diagnosis not present

## 2013-11-24 DIAGNOSIS — R0609 Other forms of dyspnea: Secondary | ICD-10-CM | POA: Insufficient documentation

## 2013-11-24 DIAGNOSIS — R9439 Abnormal result of other cardiovascular function study: Secondary | ICD-10-CM | POA: Insufficient documentation

## 2013-11-24 DIAGNOSIS — I4891 Unspecified atrial fibrillation: Secondary | ICD-10-CM | POA: Diagnosis not present

## 2013-11-24 MED ORDER — TECHNETIUM TC 99M SESTAMIBI GENERIC - CARDIOLITE
11.0000 | Freq: Once | INTRAVENOUS | Status: AC | PRN
  Administered 2013-11-24: 11 via INTRAVENOUS

## 2013-11-24 MED ORDER — TECHNETIUM TC 99M SESTAMIBI GENERIC - CARDIOLITE
33.0000 | Freq: Once | INTRAVENOUS | Status: AC | PRN
  Administered 2013-11-24: 33 via INTRAVENOUS

## 2013-11-24 MED ORDER — REGADENOSON 0.4 MG/5ML IV SOLN
0.4000 mg | Freq: Once | INTRAVENOUS | Status: AC
  Administered 2013-11-24: 0.4 mg via INTRAVENOUS

## 2013-11-24 NOTE — Progress Notes (Signed)
Echocardiogram performed.  

## 2013-11-24 NOTE — Progress Notes (Signed)
[facility] [address] [place], [place] [address] [phone]    Cardiology Nuclear Med Study  [name] is a 63 y.o. male     MRN : [id number]     DOB: 1950/08/09  Procedure Date: 11/24/2013  Nuclear Med Background Indication for Stress Test:  Evaluation for Ischemia, and Follow-up CAD History:  CAD,  04-2003 Myocardial Perfusion Imaging-[facility], EF=51%, history of SVT, VT, and Atrial Flutter Cardiac Risk Factors: Carotid Disease and Hypertension  Symptoms:  DOE and Palpitations   Nuclear Pre-Procedure Caffeine/Decaff Intake:  None> 12 hrs NPO After: 7:00pm   Lungs:  Diminished Breath Sounds, no wheezing O2 Sat: 97% on room air. IV 0.9% NS with Angio Cath:  22g  IV Site: R Antecubital x 1,tolerated well IV Started by:  Irven Baltimore, RN  Chest Size (in):  40 Cup Size: n/a  Height: 6\' 2"  (1.88 m)  Weight:  152 lb (68.947 kg)  BMI:  Body mass index is 19.51 kg/(m^2). Tech Comments:  Patient held Toprol x 24 hrs. Irven Baltimore, RN.    Nuclear Med Study 1 or 2 day study: 1 day  Stress Test Type:  Treadmill/Lexiscan  Reading MD: N/A  Order Authorizing Provider:  Dola Argyle, MD  Resting Radionuclide: Technetium 60m Sestamibi  Resting Radionuclide Dose: 11.0 mCi   Stress Radionuclide:  Technetium 24m Sestamibi  Stress Radionuclide Dose: 33.0 mCi           Stress Protocol Rest HR: 52 Stress HR: 120  Rest BP: 143/67 Stress BP: 223/72  Exercise Time (min): 9:00 METS: 7.8   Predicted Max HR: 158 bpm % Max HR: 75.95 bpm Rate Pressure Product: 26760   Dose of Adenosine (mg):  n/a Dose of Lexiscan: 0.4 mg  Dose of Atropine (mg): n/a Dose of Dobutamine: n/a mcg/kg/min (at max HR)  Stress Test Technologist: Irven Baltimore, RN  Nuclear Technologist:  Earl Many, CNMT     Rest Procedure:  Myocardial perfusion imaging was performed at rest 45 minutes following the intravenous administration of Technetium 73m Sestamibi. Rest ECG: Sinus  bradycardia with no ST changes.  Stress Procedure: The patient attempted to walk the treadmill utilizing the Bruce Protocol for 7:00 , but was unable to reach target heart rate due to DOE, RPE 15.  The patient received IV Lexiscan 0.4 mg over 15-seconds with concurrent low level exercise and then Technetium 64m Sestamibi was injected at 30-seconds while the patient continued walking one more minute.  There was a marked hypertensive response to exercise. Quantitative spect images were obtained after a 45-minute delay. Stress ECG: Borderline positive ST changes in the inferior leads.  QPS Raw Data Images:  There is interference from nuclear activity from structures below the diaphragm. This does not affect the ability to read the study. Stress Images:  There is decreased uptake in the inferior wall. Rest Images:  There is decreased uptake in the inferior wall. Subtraction (SDS):  There is a fixed defect that is most consistent with a previous infarction. Transient Ischemic Dilatation (Normal <1.22):  1.20 Lung/Heart Ratio (Normal <0.45):  0.31  Quantitative Gated Spect Images QGS EDV:  133 ml QGS ESV:  64 ml  Impression Exercise Capacity:  Lexiscan with low level exercise. BP Response:  Hypertensive blood pressure response. Clinical Symptoms:  There is dyspnea. ECG Impression:  Borderline ST changes in the inferior leads. Comparison with Prior Nuclear Study: Compared to 05/09/03, no significant change.  Overall Impression:  Low risk stress  nuclear study with a large, severe, fixed inferior defect consistent with previous infarct; no ischemia.  LV Ejection Fraction: 52%.  LV Wall Motion:  Inferior akinesis; LVE.   Kirk Ruths

## 2013-11-25 ENCOUNTER — Encounter: Payer: Self-pay | Admitting: Cardiology

## 2013-12-05 ENCOUNTER — Encounter: Payer: Self-pay | Admitting: Internal Medicine

## 2013-12-05 ENCOUNTER — Ambulatory Visit (INDEPENDENT_AMBULATORY_CARE_PROVIDER_SITE_OTHER): Payer: 59 | Admitting: Internal Medicine

## 2013-12-05 VITALS — BP 128/78 | HR 57 | Ht 74.0 in | Wt 156.0 lb

## 2013-12-05 DIAGNOSIS — I739 Peripheral vascular disease, unspecified: Secondary | ICD-10-CM

## 2013-12-05 DIAGNOSIS — I4892 Unspecified atrial flutter: Secondary | ICD-10-CM

## 2013-12-05 DIAGNOSIS — I480 Paroxysmal atrial fibrillation: Secondary | ICD-10-CM | POA: Insufficient documentation

## 2013-12-05 DIAGNOSIS — Z0000 Encounter for general adult medical examination without abnormal findings: Secondary | ICD-10-CM

## 2013-12-05 DIAGNOSIS — I4811 Longstanding persistent atrial fibrillation: Secondary | ICD-10-CM | POA: Insufficient documentation

## 2013-12-05 DIAGNOSIS — I2510 Atherosclerotic heart disease of native coronary artery without angina pectoris: Secondary | ICD-10-CM

## 2013-12-05 DIAGNOSIS — I4819 Other persistent atrial fibrillation: Secondary | ICD-10-CM

## 2013-12-05 DIAGNOSIS — R29898 Other symptoms and signs involving the musculoskeletal system: Secondary | ICD-10-CM

## 2013-12-05 DIAGNOSIS — F17200 Nicotine dependence, unspecified, uncomplicated: Secondary | ICD-10-CM

## 2013-12-05 DIAGNOSIS — I779 Disorder of arteries and arterioles, unspecified: Secondary | ICD-10-CM

## 2013-12-05 DIAGNOSIS — M79604 Pain in right leg: Secondary | ICD-10-CM

## 2013-12-05 DIAGNOSIS — M79609 Pain in unspecified limb: Secondary | ICD-10-CM

## 2013-12-05 DIAGNOSIS — F1010 Alcohol abuse, uncomplicated: Secondary | ICD-10-CM

## 2013-12-05 DIAGNOSIS — I4891 Unspecified atrial fibrillation: Secondary | ICD-10-CM

## 2013-12-05 DIAGNOSIS — I10 Essential (primary) hypertension: Secondary | ICD-10-CM

## 2013-12-05 NOTE — Progress Notes (Signed)
PCP :No PCP Per Patient Primary Cardiologist:  Ron Parker  The patient presents today for EP consultation.  He has not been seen by EP since 2012.  Has history of aflutter ablation 10/18/10. Recently, he developed tachypalpitations as we documented to have afib with RVR, for which he is here today. He converted spontaneously without any medications. Chadsvasc score at least 4, started dibigatran 2 weeks ago. He describes having intermittent episodes of irregular heartbeat but can usually work with the episodes unless rhythm is very rapid and then he feels lightheaded, tired and winded. He had another episode Saturday that lasted 3 hours,resolved past taking prn Cartia 120 mg. Recently had a stress test that did not show any ischemia, on medical management for CAD. He continues to smoke and has moderate to large alcohol intake, being heavier on week-end, which is significantly driving  irregular rhythm.. Does not have sleep apnea per wife and has had a negative sleep study in past. He also complains of right leg weakness and numbness at times,with known CAD and carotid disease.Has never had dopplers of lower extremities. He does not have a PCP.  Past Medical History  Diagnosis Date  . Adenomatous colon polyp   . Palpitations     probable SVT, rate 170,, at home,, lasted one hour,, 2012  . Hypercholesterolemia   . GERD (gastroesophageal reflux disease)   . Alcohol use   . Hypertension   . Raynaud's syndrome   . TIA (transient ischemic attack)     Possible small vessel tia in the past.  . CRAO (central retinal artery occlusion)   . BPH (benign prostatic hypertrophy)   . Carotid artery disease     2-35% R. ICA, 57-32% LICA... Doppler.... January, 2010  /  Doppler... in January, 2011 no change  /  doppler1/27/2012...stable  2-02% RICA 54-27% LICA  . Tobacco abuse   . Antiphospholipid antibody positive     ???In the past???but not proven according to patient  . Ejection fraction      EF  50-55%, echo, March, 2012... inferobasal and distal septal hypokinesis /      60%, echo, November, 2008  . CAD (coronary artery disease)     Catheterization 2005 disease / catheterization July 06, 2010, chronic total occlusion distal RCA with collaterals from right and left side.  Medical therapy recommended, mild decreased LV function       . Antiphospholipid antibody positive     ??? in the past, but not proven according to the patient.  . Atrial flutter     atrial flutter ablation,   Dr.Allred October 18, 2010  . Myocardial infarction   . Nonsustained ventricular tachycardia    Past Surgical History  Procedure Laterality Date  . Cardiac catheterization  06/27/2003    2005.... mild irregularity of the LAD..( patient had had abnormal Myoview scan)  . Inguinal hernia repair  2009    right with mesh.  Dr Marlou Starks  . Thyroglossal duct cyst  2006    Dr. Wilburn Cornelia    Current Outpatient Prescriptions  Medication Sig Dispense Refill  . atorvastatin (LIPITOR) 40 MG tablet Take 1 tablet (40 mg total) by mouth daily.  30 tablet  5  . calcium carbonate (TUMS - DOSED IN MG ELEMENTAL CALCIUM) 500 MG chewable tablet Chew 1 tablet by mouth as needed for heartburn.       Marland Kitchen CARTIA XT 120 MG 24 hr capsule Take 120 mg by mouth as needed (For elevated  heart rate).       . dabigatran (PRADAXA) 150 MG CAPS capsule Take 1 capsule (150 mg total) by mouth 2 (two) times daily.  60 capsule  3  . lisinopril (PRINIVIL,ZESTRIL) 10 MG tablet Take 10 mg by mouth daily.      . metoprolol succinate (TOPROL XL) 25 MG 24 hr tablet Take 1 tablet (25 mg total) by mouth daily.  30 tablet  6   No current facility-administered medications for this visit.    Allergies  Allergen Reactions  . Cialis [Tadalafil] Other (See Comments)    UNKNOWN  . Levitra [Vardenafil] Other (See Comments)    UNKNOWN  . Viagra [Sildenafil Citrate] Other (See Comments)    UNKNOWN    History   Social History  . Marital Status: Married     Spouse Name: N/A    Number of Children: N/A  . 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 Smoking status: Current Every Day Smoker -- 0.50 packs/day for 40 years    Types: Cigarettes  . Smokeless tobacco: Never Used     Comment: 1 pack a day  . Alcohol Use: 12.6 oz/week    21 Cans of beer per week     Comment: he continues to drink heavily despite my recommendation to quit  . Drug Use: No  . Sexual Activity: Not on file   Other Topics Concern  . Not on file   Social History Narrative  . No narrative on file    [place]- denies FH of afib  ROS-  All systems are reviewed and are negative except as outlined in the HPI above    Physical Exam: Filed Vitals:   12/05/13 0911  BP: 128/78  Pulse: 57  Height: 6\' 2"  (1.88 m)  Weight: 70.761 kg (156 lb)    GEN- The patient is well appearing, alert and oriented x 3 today.   Head- normocephalic, atraumatic Eyes-  Sclera clear, conjunctiva pink Ears- hearing intact Oropharynx- clear Neck- supple, no JVP Lymph- no cervical lymphadenopathy Lungs- Clear to ausculation bilaterally, normal work of breathing Heart- Regular rate and rhythm, no murmurs, rubs or gallops, PMI not laterally displaced GI- soft, NT, ND, + BS Extremities- no clubbing, cyanosis, or edema, rt pedal pulse diminished. MS- no significant deformity or atrophy Skin- facial plethora noted Psych- euthymic mood, full affect Neuro- strength and sensation are intact  Ekg today reveals sinus brady at 57 bpm, otherwise normal EKG.  Echo- Normal LV size and systolic function, EF 76%. Normal diastolic function. Normal RV size and systolic function. No significant valvular abnormalities.  Epic records including Dr Ron Parker' note, ER records, and event monitor are reviewed  Myoview- Low risk with a large sever, fixed inferior defect consistent with previous infarct, no ischemia. EF 52%. Inferior akinesis,  LVE.   Assessment and Plan:  1. H/o aflutter s/p ablation 8/12,  with recent docomented paroxymal Afib. Discussed AAD with patient but success will be limited due to alcohol intake. Continue to use prn Cartia. Stressed abstinence of alcohol.  2. Right leg weakness with diminished pulse. Lower extremity dopplers with ABI.  3. Stop smoking.  4. Encouraged to establish with PCP, appointment  made with Dr. Lillia Mountain primary care.  5. Chadsvasc score of at least 4, continue pradaxa.  6. F/u in 3 months.  I have seen, examined the patient, and reviewed the above assessment and plan with Roderic Palau  NP.  Changes to above are made where necessary.  The patient is s/p prior atrial flutter ablation.  He now presents with episodic afib with RVR.  Event monitor is reviewed.  I believe that this represents primarily coarse atrial fibrillation rather than atrial flutter.  His spouse says that he drinks heavily and he does not deny this.  We had a long discussion today about the importance of lifestyle modification.  He understands that we cannot adequately control his atrial arrhythmias with ongoing ETOH use.  He will try to cut back.  He is minimally symptomatic and has been initiated on appropriate rate control.  He is also anticoagulated appropriately with pradaxa.  He does not wish to initiate AAD drug therapy today.  He would prefer to continue current medicines with addition of ETOH reduction.  He will return to see Roderic Palau NP in 3 months.   He will follow-up with Dr Ron Parker a scheduled.  Co Sign: Thompson Grayer, MD 12/05/2013 10:37 PM

## 2013-12-05 NOTE — Patient Instructions (Signed)
Your physician wants you to follow-up in: 3 months with Roderic Palau, NP   You have been referred to Dr Eilleen Kempf   Your physician has requested that you have a lower extremity arterial exercise duplex. During this test, exercise and ultrasound are used to evaluate arterial blood flow in the legs. Allow one hour for this exam. There are no restrictions or special instructions.

## 2013-12-12 ENCOUNTER — Ambulatory Visit (HOSPITAL_COMMUNITY): Payer: 59 | Attending: Internal Medicine | Admitting: Cardiology

## 2013-12-12 DIAGNOSIS — F17200 Nicotine dependence, unspecified, uncomplicated: Secondary | ICD-10-CM | POA: Insufficient documentation

## 2013-12-12 DIAGNOSIS — E785 Hyperlipidemia, unspecified: Secondary | ICD-10-CM | POA: Diagnosis not present

## 2013-12-12 DIAGNOSIS — I2510 Atherosclerotic heart disease of native coronary artery without angina pectoris: Secondary | ICD-10-CM | POA: Diagnosis not present

## 2013-12-12 DIAGNOSIS — R202 Paresthesia of skin: Secondary | ICD-10-CM

## 2013-12-12 DIAGNOSIS — R200 Anesthesia of skin: Secondary | ICD-10-CM

## 2013-12-12 DIAGNOSIS — I739 Peripheral vascular disease, unspecified: Secondary | ICD-10-CM | POA: Insufficient documentation

## 2013-12-12 DIAGNOSIS — M79604 Pain in right leg: Secondary | ICD-10-CM

## 2013-12-12 DIAGNOSIS — R29898 Other symptoms and signs involving the musculoskeletal system: Secondary | ICD-10-CM

## 2013-12-12 DIAGNOSIS — I10 Essential (primary) hypertension: Secondary | ICD-10-CM | POA: Diagnosis not present

## 2013-12-12 DIAGNOSIS — R0989 Other specified symptoms and signs involving the circulatory and respiratory systems: Secondary | ICD-10-CM

## 2013-12-12 DIAGNOSIS — I779 Disorder of arteries and arterioles, unspecified: Secondary | ICD-10-CM

## 2013-12-12 NOTE — Progress Notes (Signed)
ABI performed  

## 2013-12-30 ENCOUNTER — Other Ambulatory Visit: Payer: Self-pay

## 2014-02-10 ENCOUNTER — Ambulatory Visit: Payer: 59 | Admitting: Internal Medicine

## 2014-02-13 ENCOUNTER — Ambulatory Visit (INDEPENDENT_AMBULATORY_CARE_PROVIDER_SITE_OTHER): Payer: 59 | Admitting: Cardiology

## 2014-02-13 ENCOUNTER — Encounter: Payer: Self-pay | Admitting: Cardiology

## 2014-02-13 VITALS — BP 122/62 | HR 72 | Ht 74.0 in | Wt 157.1 lb

## 2014-02-13 DIAGNOSIS — Z7189 Other specified counseling: Secondary | ICD-10-CM

## 2014-02-13 DIAGNOSIS — F17200 Nicotine dependence, unspecified, uncomplicated: Secondary | ICD-10-CM

## 2014-02-13 DIAGNOSIS — I2510 Atherosclerotic heart disease of native coronary artery without angina pectoris: Secondary | ICD-10-CM

## 2014-02-13 DIAGNOSIS — E789 Disorder of lipoprotein metabolism, unspecified: Secondary | ICD-10-CM

## 2014-02-13 DIAGNOSIS — I480 Paroxysmal atrial fibrillation: Secondary | ICD-10-CM

## 2014-02-13 DIAGNOSIS — Z720 Tobacco use: Secondary | ICD-10-CM

## 2014-02-13 MED ORDER — ATORVASTATIN CALCIUM 40 MG PO TABS: 40.0000 mg | ORAL_TABLET | Freq: Every day | ORAL | Status: DC

## 2014-02-13 MED ORDER — DILTIAZEM HCL ER COATED BEADS 120 MG PO CP24: 120.0000 mg | ORAL_CAPSULE | ORAL | Status: DC | PRN

## 2014-02-13 MED ORDER — METOPROLOL SUCCINATE ER 25 MG PO TB24: 25.0000 mg | ORAL_TABLET | Freq: Every day | ORAL | Status: DC

## 2014-02-13 MED ORDER — DABIGATRAN ETEXILATE MESYLATE 150 MG PO CAPS: 150.0000 mg | ORAL_CAPSULE | Freq: Two times a day (BID) | ORAL | Status: DC

## 2014-02-13 MED ORDER — LISINOPRIL 20 MG PO TABS: 20.0000 mg | ORAL_TABLET | Freq: Every day | ORAL | Status: DC

## 2014-02-13 NOTE — Assessment & Plan Note (Signed)
Patient is counseled to try to stop smoking.

## 2014-02-13 NOTE — Assessment & Plan Note (Signed)
His most recent arrhythmia represents paroxysmal atrial fibrillation. He is anticoagulated. He is not having a lot of symptoms. No further workup at this time.

## 2014-02-13 NOTE — Assessment & Plan Note (Signed)
He will be optimal for the patient to decrease his alcohol intake as it relates to his overall health and his arrhythmias.

## 2014-02-13 NOTE — Patient Instructions (Signed)
Your physician recommends that you continue on your current medications as directed. Please refer to the Current Medication list given to you today.  Your physician wants you to follow-up in: 6 months. You will receive a reminder letter in the mail two months in advance. If you don't receive a letter, please call our office to schedule the follow-up appointment.  

## 2014-02-13 NOTE — Assessment & Plan Note (Signed)
Coronary disease is stable. His nuclear scan in September, 2015 revealed scar but no ischemia. No further workup. He is on appropriate medications.

## 2014-02-13 NOTE — Progress Notes (Signed)
Patient ID: [name], male   DOB: 1951/03/14, 63 y.o.   MRN: [id number]    HPI Patient is seen today to follow-up atrial flutter and atrial fibrillation and coronary artery disease. I saw him last in the office November 14, 2013. He saw Dr. Rayann Heman for electrophysiology follow-up September, 2015. He has not had significant palpitations. Dr. Rayann Heman thought that his most recent arrhythmia was not a return of his atrial flutter. He felt that it was coarse atrial fibrillation. He recommended that the patient reduce his alcohol intake. He did not and any new antiarrhythmic meds. The patient is stable. He is not having significant symptoms.  Allergies  Allergen Reactions  . Cialis [Tadalafil] Other (See Comments)    UNKNOWN  . Levitra [Vardenafil] Other (See Comments)    UNKNOWN  . Viagra [Sildenafil Citrate] Other (See Comments)    UNKNOWN    Current Outpatient Prescriptions  Medication Sig Dispense Refill  . atorvastatin (LIPITOR) 40 MG tablet Take 1 tablet (40 mg total) by mouth daily. 30 tablet 5  . calcium carbonate (TUMS - DOSED IN MG ELEMENTAL CALCIUM) 500 MG chewable tablet Chew 1 tablet by mouth as needed for heartburn.     Marland Kitchen CARTIA XT 120 MG 24 hr capsule Take 120 mg by mouth as needed (For elevated heart rate).     . dabigatran (PRADAXA) 150 MG CAPS capsule Take 1 capsule (150 mg total) by mouth 2 (two) times daily. 60 capsule 3  . lisinopril (PRINIVIL,ZESTRIL) 20 MG tablet   5  . metoprolol succinate (TOPROL XL) 25 MG 24 hr tablet Take 1 tablet (25 mg total) by mouth daily. 30 tablet 6   No current facility-administered medications for this visit.    History   Social History  . Marital Status: Married    Spouse Name: N/A    Number of Children: N/A  . 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 Smoking status: Current Every Day Smoker -- 0.50 packs/day for 40 years    Types: Cigarettes  . Smokeless tobacco: Never  Used     Comment: 1 pack a day  . Alcohol Use: 12.6 oz/week    21 Cans of beer per week     Comment: he continues to drink heavily despite my recommendation to quit  . Drug Use: No  . Sexual Activity: Not on file   Other Topics Concern  . Not on file   Social History Narrative    History reviewed. No pertinent family history.  Past Medical History  Diagnosis Date  . Adenomatous colon polyp   . Palpitations     probable SVT, rate 170,, at home,, lasted one hour,, 2012  . Hypercholesterolemia   . GERD (gastroesophageal reflux disease)   . Alcohol use   . Hypertension   . Raynaud's syndrome   . TIA (transient ischemic attack)     Possible small vessel tia in the past.  . CRAO (central retinal artery occlusion)   . BPH (benign prostatic hypertrophy)   . Carotid artery disease     0-17% R. ICA, 51-02% LICA... Doppler.... January, 2010  /  Doppler... in January, 2011 no change  /  doppler1/27/2012...stable  5-85% RICA 27-78% LICA  . Tobacco abuse   . Antiphospholipid antibody positive     ???In the past???but not proven according to patient  . Ejection fraction      EF 50-55%, echo, March,  2012... inferobasal and distal septal hypokinesis /      60%, echo, November, 2008  . CAD (coronary artery disease)     Catheterization 2005 disease / catheterization July 06, 2010, chronic total occlusion distal RCA with collaterals from right and left side.  Medical therapy recommended, mild decreased LV function       . Antiphospholipid antibody positive     ??? in the past, but not proven according to the patient.  . Atrial flutter     atrial flutter ablation,   Dr.Allred October 18, 2010  . Myocardial infarction   . Nonsustained ventricular tachycardia     Past Surgical History  Procedure Laterality Date  . Cardiac catheterization  06/27/2003    2005.... mild irregularity of the LAD..( patient had had abnormal Myoview scan)  . Inguinal hernia repair  2009    right with mesh.  Dr  Marlou Starks  . Thyroglossal duct cyst  2006    Dr. Wilburn Cornelia    Patient Active Problem List   Diagnosis Date Noted  . Palpitations     Priority: High  . SVT (supraventricular tachycardia)     Priority: High  . CAD (coronary artery disease)     Priority: High  . Ejection fraction     Priority: High  . Paroxysmal atrial fibrillation 12/05/2013  . Nonsustained ventricular tachycardia   . 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 Atrial flutter   . Encounter for long-term (current) use of anticoagulants 09/05/2010  . Raynaud's syndrome   . TIA (transient ischemic attack)   . CRAO (central retinal artery occlusion)   . Carotid artery disease   . ABNORMAL ELECTROCARDIOGRAM 05/10/2010  . PERSONAL HISTORY OF COLONIC POLYPS 12/05/2009  . TOBACCO ABUSE 04/12/2009  . INGUINAL HERNIA, LEFT 12/19/2008  . BENIGN PROSTATIC HYPERTROPHY 12/19/2008  . ADENOMATOUS COLONIC POLYP 07/05/2007  . THYROID CYST 07/05/2007  . HYPERCHOLESTEROLEMIA 07/05/2007  . ALCOHOL USE 07/05/2007  . CENTRAL RETINAL ARTERY OCCLUSION 07/05/2007  . HYPERTENSION 07/05/2007  . GERD 07/05/2007    ROS  Patient denies fever, chills, headache, sweats, rash, change in vision, change in hearing, chest pain, cough, nausea or vomiting, urinary symptoms. All other systems are reviewed and are negative.  PHYSICAL EXAM Patient's oriented to person, place. Affect is normal. Head is atraumatic. Sclerae and conjunctivae are normal. There is no jugular venous distention. Lungs are clear. Respiratory effort is not labored. Cardiac exam reveals S1 and S2. The rhythm is regular. Abdomen is soft. There are peripheral edema.  Filed Vitals:   02/13/14 0811  BP: 122/62  Pulse: 72  Height: 6\' 2"  (1.88 m)  Weight: 157 lb 1.9 oz (71.269 kg)  SpO2: 96%    ASSESSMENT & PLAN

## 2014-02-14 ENCOUNTER — Other Ambulatory Visit: Payer: Self-pay | Admitting: Physician Assistant

## 2014-02-27 ENCOUNTER — Encounter: Payer: Self-pay | Admitting: Internal Medicine

## 2014-02-27 ENCOUNTER — Ambulatory Visit (INDEPENDENT_AMBULATORY_CARE_PROVIDER_SITE_OTHER): Payer: 59 | Admitting: Internal Medicine

## 2014-02-27 VITALS — BP 132/78 | HR 71 | Temp 98.0°F | Resp 18 | Ht 74.0 in | Wt 156.0 lb

## 2014-02-27 DIAGNOSIS — Z23 Encounter for immunization: Secondary | ICD-10-CM

## 2014-02-27 DIAGNOSIS — Z299 Encounter for prophylactic measures, unspecified: Secondary | ICD-10-CM

## 2014-02-27 DIAGNOSIS — I480 Paroxysmal atrial fibrillation: Secondary | ICD-10-CM

## 2014-02-27 DIAGNOSIS — F1010 Alcohol abuse, uncomplicated: Secondary | ICD-10-CM

## 2014-02-27 DIAGNOSIS — I10 Essential (primary) hypertension: Secondary | ICD-10-CM

## 2014-02-27 DIAGNOSIS — F17200 Nicotine dependence, unspecified, uncomplicated: Secondary | ICD-10-CM

## 2014-02-27 DIAGNOSIS — Z418 Encounter for other procedures for purposes other than remedying health state: Secondary | ICD-10-CM

## 2014-02-27 DIAGNOSIS — E041 Nontoxic single thyroid nodule: Secondary | ICD-10-CM

## 2014-02-27 DIAGNOSIS — L989 Disorder of the skin and subcutaneous tissue, unspecified: Secondary | ICD-10-CM

## 2014-02-27 DIAGNOSIS — Z720 Tobacco use: Secondary | ICD-10-CM

## 2014-02-27 DIAGNOSIS — E7800 Pure hypercholesterolemia, unspecified: Secondary | ICD-10-CM

## 2014-02-27 DIAGNOSIS — Z0000 Encounter for general adult medical examination without abnormal findings: Secondary | ICD-10-CM

## 2014-02-27 MED ORDER — DOXAZOSIN MESYLATE 4 MG PO TABS: 4.0000 mg | ORAL_TABLET | Freq: Every day | ORAL | Status: DC

## 2014-02-27 NOTE — Patient Instructions (Signed)
We will send you to the dermatologist so that they can likely take off the place on your face. They can also look at all of your other skin areas to make sure that there are no other spots that need to be removed.  We have sent in a medicine called Cardura which should help with your nighttime urination. I would recommend to take it after dinnertime daily. It may take several weeks before you notice the full effects.  We will see back in about 6-12 months for a follow-up. If you have any other problems or new questions before then please feel free to call our office.  Smoking Cessation, Tips for Success If you are ready to quit smoking, congratulations! You have chosen to help yourself be healthier. Cigarettes bring nicotine, tar, carbon monoxide, and other irritants into your body. Your lungs, heart, and blood vessels will be able to work better without these poisons. There are many different ways to quit smoking. Nicotine gum, nicotine patches, a nicotine inhaler, or nicotine nasal spray can help with physical craving. Hypnosis, support groups, and medicines help break the habit of smoking. WHAT THINGS CAN I DO TO MAKE QUITTING EASIER?  Here are some tips to help you quit for good:  Pick a date when you will quit smoking completely. Tell all of your friends and family about your plan to quit on that date.  Do not try to slowly cut down on the number of cigarettes you are smoking. Pick a quit date and quit smoking completely starting on that day.  Throw away all cigarettes.   Clean and remove all ashtrays from your home, work, and car.  On a card, write down your reasons for quitting. Carry the card with you and read it when you get the urge to smoke.  Cleanse your body of nicotine. Drink enough water and fluids to keep your urine clear or pale yellow. Do this after quitting to flush the nicotine from your body.  Learn to predict your moods. Do not let a bad situation be your excuse to have  a cigarette. Some situations in your life might tempt you into wanting a cigarette.  Never have "just one" cigarette. It leads to wanting another and another. Remind yourself of your decision to quit.  Change habits associated with smoking. If you smoked while driving or when feeling stressed, try other activities to replace smoking. Stand up when drinking your coffee. Brush your teeth after eating. Sit in a different chair when you read the paper. Avoid alcohol while trying to quit, and try to drink fewer caffeinated beverages. Alcohol and caffeine may urge you to smoke.  Avoid foods and drinks that can trigger a desire to smoke, such as sugary or spicy foods and alcohol.  Ask people who smoke not to smoke around you.  Have something planned to do right after eating or having a cup of coffee. For example, plan to take a walk or exercise.  Try a relaxation exercise to calm you down and decrease your stress. Remember, you may be tense and nervous for the first 2 weeks after you quit, but this will pass.  Find new activities to keep your hands busy. Play with a pen, coin, or rubber band. Doodle or draw things on paper.  Brush your teeth right after eating. This will help cut down on the craving for the taste of tobacco after meals. You can also try mouthwash.   Use oral substitutes in place of cigarettes.  Try using lemon drops, carrots, cinnamon sticks, or chewing gum. Keep them handy so they are available when you have the urge to smoke.  When you have the urge to smoke, try deep breathing.  Designate your home as a nonsmoking area.  If you are a heavy smoker, ask your health care provider about a prescription for nicotine chewing gum. It can ease your withdrawal from nicotine.  Reward yourself. Set aside the cigarette money you save and buy yourself something nice.  Look for support from others. Join a support group or smoking cessation program. Ask someone at home or at work to help you  with your plan to quit smoking.  Always ask yourself, "Do I need this cigarette or is this just a reflex?" Tell yourself, "Today, I choose not to smoke," or "I do not want to smoke." You are reminding yourself of your decision to quit.  Do not replace cigarette smoking with electronic cigarettes (commonly called e-cigarettes). The safety of e-cigarettes is unknown, and some may contain harmful chemicals.  If you relapse, do not give up! Plan ahead and think about what you will do the next time you get the urge to smoke. HOW WILL I FEEL WHEN I QUIT SMOKING? You may have symptoms of withdrawal because your body is used to nicotine (the addictive substance in cigarettes). You may crave cigarettes, be irritable, feel very hungry, cough often, get headaches, or have difficulty concentrating. The withdrawal symptoms are only temporary. They are strongest when you first quit but will go away within 10-14 days. When withdrawal symptoms occur, stay in control. Think about your reasons for quitting. Remind yourself that these are signs that your body is healing and getting used to being without cigarettes. Remember that withdrawal symptoms are easier to treat than the major diseases that smoking can cause.  Even after the withdrawal is over, expect periodic urges to smoke. However, these cravings are generally short lived and will go away whether you smoke or not. Do not smoke! WHAT RESOURCES ARE AVAILABLE TO HELP ME QUIT SMOKING? Your health care provider can direct you to community resources or hospitals for support, which may include:  Group support.  Education.  Hypnosis.  Therapy. Document Released: 11/30/2003 Document Revised: 07/18/2013 Document Reviewed: 08/19/2012 [facility] Patient Information 2015 [place], [place]. This information is not intended to replace advice given to you by your health care provider. Make sure you discuss any questions you have with your health care provider.

## 2014-02-27 NOTE — Progress Notes (Signed)
Pre visit review using our clinic review tool, if applicable. No additional management support is needed unless otherwise documented below in the visit note. 

## 2014-02-28 DIAGNOSIS — Z0000 Encounter for general adult medical examination without abnormal findings: Secondary | ICD-10-CM | POA: Insufficient documentation

## 2014-02-28 DIAGNOSIS — L989 Disorder of the skin and subcutaneous tissue, unspecified: Secondary | ICD-10-CM | POA: Insufficient documentation

## 2014-02-28 NOTE — Progress Notes (Signed)
   Subjective:    Patient ID: [name], male    DOB: 06-05-1950, 63 y.o.   MRN: [id number]  HPI The patient is a 63 year old man who comes in today to establish care. He previously has been falling slowly with cardiology in urgent care as needed. He does have past medical history of CAD, paroxysmal A. fib, tobacco abuse, alcohol use, hypertension, BPH. His cardiologists feel that his alcohol intake is related to his atrial fibrillation and they have asked him to stop all alcohol intake. He states that he remembers hearing and has maybe try to cut back a little bit and is now drinking only 2-3 beers per day. He is still smoking and he also recalls that he's been told several times by his cardiologist to stop smoking. He is not quite sure that he's ever really have the willpower to do so nor has he had a convincing reason to quit. He denies any new complaints today including chest pain, shortness of breath, abdominal pain, acid reflux, constipation, diarrhea. He does also have several lesions on his face and back which he wishes me to evaluate to see if they need removal.  Review of Systems  Constitutional: Negative for fever, activity change, appetite change, fatigue and unexpected weight change.  HENT: Negative.   Respiratory: Negative for cough, chest tightness, shortness of breath and wheezing.   Cardiovascular: Positive for palpitations. Negative for chest pain and leg swelling.       Occasional, he states he feels a little different when in A. fib but not always able to tell.  Gastrointestinal: Negative for abdominal pain, diarrhea, constipation, blood in stool and abdominal distention.  Musculoskeletal: Negative.   Neurological: Negative for dizziness, syncope, weakness, light-headedness and headaches.      Objective:   Physical Exam  Constitutional: He is oriented to person, place, and time. He appears well-developed and well-nourished.  HENT:  Head: Normocephalic and atraumatic.    Eyes: EOM are normal.  Neck: Normal range of motion.  Cardiovascular: Normal rate.   Pulmonary/Chest: Effort normal and breath sounds normal. No respiratory distress. He has no wheezes. He has no rales.  Abdominal: Soft. Bowel sounds are normal. He exhibits no distension. There is no tenderness. There is no rebound.  Neurological: He is alert and oriented to person, place, and time. Coordination normal.  Skin: Skin is warm and dry.  Lesion 0.5 mm diameter under the left eyelid suspicious for needing removal. Several moles on the back less worrisome.   Filed Vitals:   02/27/14 1554  BP: 132/78  Pulse: 71  Temp: 98 F (36.7 C)  Resp: 18      Assessment & Plan:

## 2014-02-28 NOTE — Assessment & Plan Note (Signed)
Patient states he has cut back, did remind him that any alcohol can be toxic to the heart and can cause him to stay in atrial fibrillation. He is currently using about 2-3 beers per day.

## 2014-02-28 NOTE — Assessment & Plan Note (Signed)
Patient is rate controlled with metoprolol, has when necessary diltiazem if he feels his heart rate increasing. He is anticoagulated with pradaxa.

## 2014-02-28 NOTE — Assessment & Plan Note (Signed)
Lesion under the left eye suspicious and recommend removal. Referral placed for dermatology. He does also have several lesions on his back. He also has another lesion that he states is growing which he refuses to show to me and states it's on a sensitive area.

## 2014-02-28 NOTE — Assessment & Plan Note (Signed)
Did speak with the patient about the risks and harms of cigarette smoke. Did remind him that quitting cigarettes may be the best thing he can do for his health. He is currently pre-contemplative

## 2014-02-28 NOTE — Assessment & Plan Note (Signed)
Colonoscopy done 2012, lipid channel checked recently, BMP reviewed and normal. Patient declines flu shot. Given Tdap at today's visit. Spoke with him about shingles shot and he would like to think about that.

## 2014-02-28 NOTE — Assessment & Plan Note (Signed)
BP well controlled on metoprolol XL.

## 2014-02-28 NOTE — Assessment & Plan Note (Signed)
Not palpable on exam today. Will review records to see if this requires follow-up.

## 2014-02-28 NOTE — Assessment & Plan Note (Signed)
Patient on statin, no side effects, recent lipid panel within the last 6 months. Will check at next visit.

## 2014-03-08 ENCOUNTER — Other Ambulatory Visit: Payer: Self-pay | Admitting: Physician Assistant

## 2014-05-24 ENCOUNTER — Other Ambulatory Visit: Payer: Self-pay | Admitting: Cardiology

## 2014-06-06 ENCOUNTER — Other Ambulatory Visit: Payer: Self-pay | Admitting: Radiology

## 2014-06-06 DIAGNOSIS — I6523 Occlusion and stenosis of bilateral carotid arteries: Secondary | ICD-10-CM

## 2014-07-01 ENCOUNTER — Other Ambulatory Visit: Payer: Self-pay | Admitting: Cardiology

## 2014-08-16 ENCOUNTER — Encounter: Payer: Self-pay | Admitting: Cardiology

## 2014-08-16 ENCOUNTER — Ambulatory Visit (HOSPITAL_COMMUNITY): Payer: 59 | Attending: Cardiovascular Disease

## 2014-08-16 ENCOUNTER — Ambulatory Visit (INDEPENDENT_AMBULATORY_CARE_PROVIDER_SITE_OTHER): Payer: 59 | Admitting: Cardiology

## 2014-08-16 VITALS — BP 106/66 | HR 65 | Ht 74.0 in | Wt 158.0 lb

## 2014-08-16 DIAGNOSIS — I480 Paroxysmal atrial fibrillation: Secondary | ICD-10-CM

## 2014-08-16 DIAGNOSIS — I2510 Atherosclerotic heart disease of native coronary artery without angina pectoris: Secondary | ICD-10-CM

## 2014-08-16 DIAGNOSIS — I739 Peripheral vascular disease, unspecified: Secondary | ICD-10-CM

## 2014-08-16 DIAGNOSIS — I6523 Occlusion and stenosis of bilateral carotid arteries: Secondary | ICD-10-CM | POA: Insufficient documentation

## 2014-08-16 DIAGNOSIS — I779 Disorder of arteries and arterioles, unspecified: Secondary | ICD-10-CM | POA: Diagnosis not present

## 2014-08-16 DIAGNOSIS — E785 Hyperlipidemia, unspecified: Secondary | ICD-10-CM

## 2014-08-16 DIAGNOSIS — F17200 Nicotine dependence, unspecified, uncomplicated: Secondary | ICD-10-CM

## 2014-08-16 DIAGNOSIS — Z720 Tobacco use: Secondary | ICD-10-CM

## 2014-08-16 NOTE — Assessment & Plan Note (Signed)
Patient continues to smoke. I have counseled him to stop today.

## 2014-08-16 NOTE — Progress Notes (Signed)
Cardiology Office Note   Date:  08/16/2014   ID:  [name], DOB 1950-07-04, MRN [id number]  PCP:  Olga Millers, MD  Cardiologist:  Dola Argyle, MD   Chief Complaint  Patient presents with  . Appointment    Follow-up atrial arrhythmias.      History of Present Illness: [name] is a 64 y.o. male who presents patient is seen to follow-up history of atrial fib and atrial flutter. His atrial flutter was ablated successfully. Most recently he has had some coarse atrial fib. This has been limited. There has been no need to add any antiarrhythmics. He is anticoagulated. He is not having any significant chest pain or shortness of breath.    Past Medical History  Diagnosis Date  . Adenomatous colon polyp   . Palpitations     probable SVT, rate 170,, at home,, lasted one hour,, 2012  . Hypercholesterolemia   . GERD (gastroesophageal reflux disease)   . Alcohol use   . Hypertension   . Raynaud's syndrome   . TIA (transient ischemic attack)     Possible small vessel tia in the past.  . CRAO (central retinal artery occlusion)   . BPH (benign prostatic hypertrophy)   . Carotid artery disease     6-54% R. ICA, 65-03% LICA... Doppler.... January, 2010  /  Doppler... in January, 2011 no change  /  doppler1/27/2012...stable  5-46% RICA 56-81% LICA  . Tobacco abuse   . Antiphospholipid antibody positive     ???In the past???but not proven according to patient  . Ejection fraction      EF 50-55%, echo, March, 2012... inferobasal and distal septal hypokinesis /      60%, echo, November, 2008  . CAD (coronary artery disease)     Catheterization 2005 disease / catheterization July 06, 2010, chronic total occlusion distal RCA with collaterals from right and left side.  Medical therapy recommended, mild decreased LV function       . Antiphospholipid antibody positive     ??? in the past, but not proven according to the patient.  . Atrial flutter     atrial flutter ablation,    Dr.Allred October 18, 2010  . Myocardial infarction   . Nonsustained ventricular tachycardia     Past Surgical History  Procedure Laterality Date  . Cardiac catheterization  06/27/2003    2005.... mild irregularity of the LAD..( patient had had abnormal Myoview scan)  . Inguinal hernia repair  2009    right with mesh.  Dr Marlou Starks  . Thyroglossal duct cyst  2006    Dr. Wilburn Cornelia    Patient Active Problem List   Diagnosis Date Noted  . SVT (supraventricular tachycardia)     Priority: High  . CAD (coronary artery disease)     Priority: High  . Routine general medical examination at a health care facility 02/28/2014  . Skin lesion 02/28/2014  . Paroxysmal atrial fibrillation 12/05/2013  . Nonsustained ventricular tachycardia   . 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 Raynaud's syndrome   . TIA (transient ischemic attack)   . CRAO (central retinal artery occlusion)   . Carotid artery disease   . TOBACCO ABUSE 04/12/2009  . BENIGN PROSTATIC HYPERTROPHY 12/19/2008  . THYROID CYST 07/05/2007  . HYPERCHOLESTEROLEMIA 07/05/2007  . Alcohol abuse 07/05/2007  . Essential hypertension 07/05/2007  . GERD 07/05/2007      Current Outpatient Prescriptions  Medication Sig Dispense Refill  . atorvastatin (LIPITOR) 40 MG tablet  TAKE 1 TABLET BY MOUTH EVERY DAY 30 tablet 6  . calcium carbonate (TUMS - DOSED IN MG ELEMENTAL CALCIUM) 500 MG chewable tablet Chew 1 tablet by mouth as needed for heartburn.     . diltiazem (CARTIA XT) 120 MG 24 hr capsule Take 1 capsule (120 mg total) by mouth as needed (For elevated heart rate). 30 capsule 6  . lisinopril (PRINIVIL,ZESTRIL) 20 MG tablet TAKE 1 TABLET BY MOUTH EVERY DAY 30 tablet 3  . metoprolol succinate (TOPROL XL) 25 MG 24 hr tablet Take 1 tablet (25 mg total) by mouth daily. 30 tablet 11  . PRADAXA 150 MG CAPS capsule TAKE 1 CAPSULE BY MOUTH TWICE DAILY 60 capsule 1   No current facility-administered medications for this visit.    Allergies:    Cialis; Levitra; and Viagra    Social History:  The patient  reports that he has been smoking Cigarettes.  He has a 20 pack-year smoking history. He has never used smokeless tobacco. He reports that he drinks about 12.6 oz of alcohol per week. He reports that he does not use illicit drugs.   Family History:  The patient's family history is not on file. He was adopted.    ROS:  Please see the history of present illness.     Patient denies fever, chills, headache, sweats, rash, change in vision, change in hearing, chest pain, cough, nausea or vomiting, urinary symptoms. All other systems are reviewed and are negative.   PHYSICAL EXAM: VS:  BP 106/66 mmHg  Pulse 65  Ht 6\' 2"  (1.88 m)  Wt 158 lb (71.668 kg)  BMI 20.28 kg/m2  SpO2 97% , Patient is oriented to person time and place. Affect is normal. Head is atraumatic. Sclerae and conjunctiva are normal. There is no jugular venous distention. Lungs are clear. Respiratory effort is nonlabored. Cardiac exam reveals S1 and S2. The rhythm is regular. The abdomen is soft. There is no peripheral edema. There are no musculoskeletal deformities. The patient has a reddish tint to his skin that is chronic. Neurologic is grossly intact.  EKG:   EKG is not done today.   Recent Labs: 09/05/2013: ALT 21; BUN 20; Creatinine 1.09; Hemoglobin 15.1; Platelets 253; Potassium 5.1; Sodium 137 10/21/2013: TSH 0.42    Lipid Panel    Component Value Date/Time   CHOL 130 08/20/2013 0903   TRIG 35 08/20/2013 0903   HDL 56 08/20/2013 0903   CHOLHDL 2.3 08/20/2013 0903   VLDL 7 08/20/2013 0903   LDLCALC 67 08/20/2013 0903      Wt Readings from Last 3 Encounters:  08/16/14 158 lb (71.668 kg)  02/27/14 156 lb (70.761 kg)  02/13/14 157 lb 1.9 oz (71.269 kg)      Current medicines are reviewed  The patient understands his medications.     ASSESSMENT AND PLAN:

## 2014-08-16 NOTE — Assessment & Plan Note (Signed)
His atrial flutter has been ablated. We think he may have some rare intermittent coarse atrial fibrillation. He is anticoagulated. No further workup.

## 2014-08-16 NOTE — Assessment & Plan Note (Signed)
Patient's cardiac status is stable. His nuclear scan in 2015 revealed an old scar but no ischemia. His ejection fraction is normal. No further workup.

## 2014-08-16 NOTE — Assessment & Plan Note (Signed)
Patient's lipids are treated appropriately. No change in therapy.

## 2014-08-16 NOTE — Patient Instructions (Signed)
**Note De-identified [name] Obfuscation** Medication Instructions:  Same-no change  Labwork: None  Testing/Procedures: None  Follow-Up: Your physician wants you to follow-up in: 6 months. You will receive a reminder letter in the mail two months in advance. If you don't receive a letter, please call our office to schedule the follow-up appointment.      

## 2014-08-16 NOTE — Assessment & Plan Note (Signed)
Carotid disease is stable. The preliminary study today shows that some of his velocities are slightly improved from the past. He will have follow-up in one year.

## 2014-08-28 ENCOUNTER — Other Ambulatory Visit: Payer: Self-pay | Admitting: *Deleted

## 2014-08-28 MED ORDER — ATORVASTATIN CALCIUM 40 MG PO TABS: 40.0000 mg | ORAL_TABLET | Freq: Every day | ORAL | Status: DC

## 2014-09-15 ENCOUNTER — Encounter: Payer: Self-pay | Admitting: Gastroenterology

## 2014-09-29 ENCOUNTER — Other Ambulatory Visit: Payer: Self-pay | Admitting: Cardiology

## 2015-01-19 ENCOUNTER — Encounter: Payer: Self-pay | Admitting: Gastroenterology

## 2015-02-26 ENCOUNTER — Other Ambulatory Visit: Payer: Self-pay | Admitting: Cardiology

## 2015-02-27 ENCOUNTER — Other Ambulatory Visit: Payer: Self-pay | Admitting: Cardiology

## 2015-03-06 ENCOUNTER — Other Ambulatory Visit (INDEPENDENT_AMBULATORY_CARE_PROVIDER_SITE_OTHER): Payer: 59

## 2015-03-06 ENCOUNTER — Encounter: Payer: Self-pay | Admitting: Internal Medicine

## 2015-03-06 ENCOUNTER — Ambulatory Visit (INDEPENDENT_AMBULATORY_CARE_PROVIDER_SITE_OTHER): Payer: 59 | Admitting: Internal Medicine

## 2015-03-06 VITALS — BP 144/72 | HR 65 | Temp 98.3°F | Resp 12 | Ht 74.0 in | Wt 157.4 lb

## 2015-03-06 DIAGNOSIS — I10 Essential (primary) hypertension: Secondary | ICD-10-CM

## 2015-03-06 DIAGNOSIS — F17200 Nicotine dependence, unspecified, uncomplicated: Secondary | ICD-10-CM

## 2015-03-06 DIAGNOSIS — Z0000 Encounter for general adult medical examination without abnormal findings: Secondary | ICD-10-CM

## 2015-03-06 LAB — CBC
HEMATOCRIT: 48.7 % (ref 39.0–52.0)
HEMOGLOBIN: 16.4 g/dL (ref 13.0–17.0)
MCHC: 33.6 g/dL (ref 30.0–36.0)
MCV: 96.9 fl (ref 78.0–100.0)
PLATELETS: 257 10*3/uL (ref 150.0–400.0)
RBC: 5.03 Mil/uL (ref 4.22–5.81)
RDW: 13.3 % (ref 11.5–15.5)
WBC: 8.8 10*3/uL (ref 4.0–10.5)

## 2015-03-06 LAB — COMPREHENSIVE METABOLIC PANEL
ALBUMIN: 4.9 g/dL (ref 3.5–5.2)
ALK PHOS: 61 U/L (ref 39–117)
ALT: 23 U/L (ref 0–53)
AST: 21 U/L (ref 0–37)
BUN: 21 mg/dL (ref 6–23)
CHLORIDE: 100 meq/L (ref 96–112)
CO2: 27 mEq/L (ref 19–32)
Calcium: 10.2 mg/dL (ref 8.4–10.5)
Creatinine, Ser: 0.98 mg/dL (ref 0.40–1.50)
GFR: 81.94 mL/min (ref >60.00)
Glucose, Bld: 108 mg/dL — ABNORMAL HIGH (ref 70–99)
POTASSIUM: 4.7 meq/L (ref 3.5–5.1)
SODIUM: 135 meq/L (ref 135–145)
TOTAL PROTEIN: 7.5 g/dL (ref 6.0–8.3)
Total Bilirubin: 0.8 mg/dL (ref 0.2–1.2)

## 2015-03-06 LAB — LIPID PANEL
CHOL/HDL RATIO: 2
Cholesterol: 143 mg/dL (ref 0–200)
HDL: 61.4 mg/dL (ref >39.00)
LDL CALC: 68 mg/dL (ref 0–99)
NONHDL: 81.79
Triglycerides: 67 mg/dL (ref 0.0–149.0)
VLDL: 13.4 mg/dL (ref 0.0–40.0)

## 2015-03-06 LAB — TSH: TSH: 1.94 u[IU]/mL (ref 0.35–4.50)

## 2015-03-06 NOTE — Progress Notes (Signed)
   Subjective:    Patient ID: [name], male    DOB: [birth year], 64 y.o.   MRN: [id number]  HPI The patient is a 64 YO man coming in for wellness. No new concerns. Still smoking and has thought of quitting but not seriously.   PMH, [facility], social history reviewed and updated.   Review of Systems  Constitutional: Negative for fever, activity change, appetite change, fatigue and unexpected weight change.  HENT: Negative.   Respiratory: Negative for cough, chest tightness, shortness of breath and wheezing.   Cardiovascular: Positive for palpitations. Negative for chest pain and leg swelling.       Occasional, he states he feels a little different when in A. fib but not always able to tell.  Gastrointestinal: Negative for abdominal pain, diarrhea, constipation, blood in stool and abdominal distention.  Musculoskeletal: Negative.   Neurological: Negative for dizziness, syncope, weakness, light-headedness and headaches.      Objective:   Physical Exam  Constitutional: He is oriented to person, place, and time. He appears well-developed and well-nourished.  HENT:  Head: Normocephalic and atraumatic.  Eyes: EOM are normal.  Neck: Normal range of motion.  Cardiovascular: Normal rate.   Pulmonary/Chest: Effort normal and breath sounds normal. No respiratory distress. He has no wheezes. He has no rales.  Abdominal: Soft. Bowel sounds are normal. He exhibits no distension. There is no tenderness. There is no rebound.  Neurological: He is alert and oriented to person, place, and time. Coordination normal.  Skin: Skin is warm and dry.   Filed Vitals:   03/06/15 0804  BP: 144/72  Pulse: 65  Temp: 98.3 F (36.8 C)  TempSrc: Oral  Resp: 12  Height: 6\' 2"  (1.88 m)  Weight: 157 lb 6.4 oz (71.396 kg)  SpO2: 96%      Assessment & Plan:

## 2015-03-06 NOTE — Assessment & Plan Note (Signed)
Reminded him about quitting and he has thought about it but not seriously. Counseled on the risk and harm from smoking.

## 2015-03-06 NOTE — Assessment & Plan Note (Signed)
Reminded him about need for smoking cessation. Declines any immunizations. States due for colonoscopy next spring (they told him 5 years he thinks). Reminded about sunscreen and watching moles for progression. Does exercise.

## 2015-03-06 NOTE — Patient Instructions (Signed)
We are checking the blood work today and will call you back about the results.   Think about stopping smoking to keep the health good.   Health Maintenance, Male A healthy lifestyle and preventative care can promote health and wellness.  Maintain regular health, dental, and eye exams.  Eat a healthy diet. Foods like vegetables, fruits, whole grains, low-fat dairy products, and lean protein foods contain the nutrients you need and are low in calories. Decrease your intake of foods high in solid fats, added sugars, and salt. Get information about a proper diet from your health care provider, if necessary.  Regular physical exercise is one of the most important things you can do for your health. Most adults should get at least 150 minutes of moderate-intensity exercise (any activity that increases your heart rate and causes you to sweat) each week. In addition, most adults need muscle-strengthening exercises on 2 or more days a week.   Maintain a healthy weight. The body mass index (BMI) is a screening tool to identify possible weight problems. It provides an estimate of body fat based on height and weight. Your health care provider can find your BMI and can help you achieve or maintain a healthy weight. For males 20 years and older:  A BMI below 18.5 is considered underweight.  A BMI of 18.5 to 24.9 is normal.  A BMI of 25 to 29.9 is considered overweight.  A BMI of 30 and above is considered obese.  Maintain normal blood lipids and cholesterol by exercising and minimizing your intake of saturated fat. Eat a balanced diet with plenty of fruits and vegetables. Blood tests for lipids and cholesterol should begin at age 49 and be repeated every 5 years. If your lipid or cholesterol levels are high, you are over age 65, or you are at high risk for heart disease, you may need your cholesterol levels checked more frequently.Ongoing high lipid and cholesterol levels should be treated with medicines  if diet and exercise are not working.  If you smoke, find out from your health care provider how to quit. If you do not use tobacco, do not start.  Lung cancer screening is recommended for adults aged 21-80 years who are at high risk for developing lung cancer because of a history of smoking. A yearly low-dose CT scan of the lungs is recommended for people who have at least a 30-pack-year history of smoking and are current smokers or have quit within the past 15 years. A pack year of smoking is smoking an average of 1 pack of cigarettes a day for 1 year (for example, a 30-pack-year history of smoking could mean smoking 1 pack a day for 30 years or 2 packs a day for 15 years). Yearly screening should continue until the smoker has stopped smoking for at least 15 years. Yearly screening should be stopped for people who develop a health problem that would prevent them from having lung cancer treatment.  If you choose to drink alcohol, do not have more than 2 drinks per day. One drink is considered to be 12 oz (360 mL) of beer, 5 oz (150 mL) of wine, or 1.5 oz (45 mL) of liquor.  Avoid the use of street drugs. Do not share needles with anyone. Ask for help if you need support or instructions about stopping the use of drugs.  High blood pressure causes heart disease and increases the risk of stroke. High blood pressure is more likely to develop in:  People  who have blood pressure in the end of the normal range (100-139/85-89 mm Hg).  People who are overweight or obese.  People who are African American.  If you are 40-44 years of age, have your blood pressure checked every 3-5 years. If you are 66 years of age or older, have your blood pressure checked every year. You should have your blood pressure measured twice--once when you are at a hospital or clinic, and once when you are not at a hospital or clinic. Record the average of the two measurements. To check your blood pressure when you are not at a  hospital or clinic, you can use:  An automated blood pressure machine at a pharmacy.  A home blood pressure monitor.  If you are 51-13 years old, ask your health care provider if you should take aspirin to prevent heart disease.  Diabetes screening involves taking a blood sample to check your fasting blood sugar level. This should be done once every 3 years after age 67 if you are at a normal weight and without risk factors for diabetes. Testing should be considered at a younger age or be carried out more frequently if you are overweight and have at least 1 risk factor for diabetes.  Colorectal cancer can be detected and often prevented. Most routine colorectal cancer screening begins at the age of 73 and continues through age 80. However, your health care provider may recommend screening at an earlier age if you have risk factors for colon cancer. On a yearly basis, your health care provider may provide home test kits to check for hidden blood in the stool. A small camera at the end of a tube may be used to directly examine the colon (sigmoidoscopy or colonoscopy) to detect the earliest forms of colorectal cancer. Talk to your health care provider about this at age 81 when routine screening begins. A direct exam of the colon should be repeated every 5-10 years through age 21, unless early forms of precancerous polyps or small growths are found.  People who are at an increased risk for hepatitis B should be screened for this virus. You are considered at high risk for hepatitis B if:  You were born in a country where hepatitis B occurs often. Talk with your health care provider about which countries are considered high risk.  Your parents were born in a high-risk country and you have not received a shot to protect against hepatitis B (hepatitis B vaccine).  You have HIV or AIDS.  You use needles to inject street drugs.  You live with, or have sex with, someone who has hepatitis B.  You are a  man who has sex with other men (MSM).  You get hemodialysis treatment.  You take certain medicines for conditions like cancer, organ transplantation, and autoimmune conditions.  Hepatitis C blood testing is recommended for all people born from [birth year] through 1964 and any individual with known risk factors for hepatitis C.  Healthy men should no longer receive prostate-specific antigen (PSA) blood tests as part of routine cancer screening. Talk to your health care provider about prostate cancer screening.  Testicular cancer screening is not recommended for adolescents or adult males who have no symptoms. Screening includes self-exam, a health care provider exam, and other screening tests. Consult with your health care provider about any symptoms you have or any concerns you have about testicular cancer.  Practice safe sex. Use condoms and avoid high-risk sexual practices to reduce the spread of sexually  transmitted infections (STIs).  You should be screened for STIs, including gonorrhea and chlamydia if:  You are sexually active and are younger than 24 years.  You are older than 24 years, and your health care provider tells you that you are at risk for this type of infection.  Your sexual activity has changed since you were last screened, and you are at an increased risk for chlamydia or gonorrhea. Ask your health care provider if you are at risk.  If you are at risk of being infected with HIV, it is recommended that you take a prescription medicine daily to prevent HIV infection. This is called pre-exposure prophylaxis (PrEP). You are considered at risk if:  You are a man who has sex with other men (MSM).  You are a heterosexual man who is sexually active with multiple partners.  You take drugs by injection.  You are sexually active with a partner who has HIV.  Talk with your health care provider about whether you are at high risk of being infected with HIV. If you choose to begin PrEP,  you should first be tested for HIV. You should then be tested every 3 months for as long as you are taking PrEP.  Use sunscreen. Apply sunscreen liberally and repeatedly throughout the day. You should seek shade when your shadow is shorter than you. Protect yourself by wearing long sleeves, pants, a wide-brimmed hat, and sunglasses year round whenever you are outdoors.  Tell your health care provider of new moles or changes in moles, especially if there is a change in shape or color. Also, tell your health care provider if a mole is larger than the size of a pencil eraser.  A one-time screening for abdominal aortic aneurysm (AAA) and surgical repair of large AAAs by ultrasound is recommended for men aged 62-75 years who are current or former smokers.  Stay current with your vaccines (immunizations).   This information is not intended to replace advice given to you by your health care provider. Make sure you discuss any questions you have with your health care provider.   Document Released: 08/30/2007 Document Revised: 03/24/2014 Document Reviewed: 07/29/2010 Elsevier Interactive Patient Education [employer].

## 2015-03-06 NOTE — Progress Notes (Signed)
Pre visit review using our clinic review tool, if applicable. No additional management support is needed unless otherwise documented below in the visit note. 

## 2015-03-06 NOTE — Assessment & Plan Note (Signed)
BP slightly above goal but did not take medicine this morning due to fasting for blood work. Previous normal and continue diltiazem and lisinopril and metoprolol. Checking CMP and adjust as needed.

## 2015-03-07 LAB — HEPATITIS C ANTIBODY: HCV Ab: NEGATIVE

## 2015-03-30 ENCOUNTER — Ambulatory Visit (INDEPENDENT_AMBULATORY_CARE_PROVIDER_SITE_OTHER): Payer: 59 | Admitting: Cardiology

## 2015-03-30 ENCOUNTER — Encounter: Payer: Self-pay | Admitting: Cardiology

## 2015-03-30 VITALS — BP 118/70 | HR 70 | Ht 74.0 in | Wt 160.8 lb

## 2015-03-30 DIAGNOSIS — E7800 Pure hypercholesterolemia, unspecified: Secondary | ICD-10-CM

## 2015-03-30 DIAGNOSIS — I739 Peripheral vascular disease, unspecified: Secondary | ICD-10-CM

## 2015-03-30 DIAGNOSIS — I2510 Atherosclerotic heart disease of native coronary artery without angina pectoris: Secondary | ICD-10-CM

## 2015-03-30 DIAGNOSIS — R002 Palpitations: Secondary | ICD-10-CM | POA: Diagnosis not present

## 2015-03-30 DIAGNOSIS — I779 Disorder of arteries and arterioles, unspecified: Secondary | ICD-10-CM

## 2015-03-30 DIAGNOSIS — I480 Paroxysmal atrial fibrillation: Secondary | ICD-10-CM | POA: Diagnosis not present

## 2015-03-30 DIAGNOSIS — I4892 Unspecified atrial flutter: Secondary | ICD-10-CM | POA: Diagnosis not present

## 2015-03-30 MED ORDER — DILTIAZEM HCL ER COATED BEADS 120 MG PO CP24: 120.0000 mg | ORAL_CAPSULE | ORAL | Status: DC | PRN

## 2015-03-30 MED ORDER — LISINOPRIL 20 MG PO TABS: 20.0000 mg | ORAL_TABLET | Freq: Every day | ORAL | Status: DC

## 2015-03-30 MED ORDER — DABIGATRAN ETEXILATE MESYLATE 150 MG PO CAPS: 150.0000 mg | ORAL_CAPSULE | Freq: Two times a day (BID) | ORAL | Status: DC

## 2015-03-30 NOTE — Patient Instructions (Signed)

## 2015-03-30 NOTE — Progress Notes (Signed)
Cardiology Office Note   Date:  03/30/2015   ID:  [name], DOB 1950/04/06, MRN [id number]  PCP:  Hoyt Koch, MD  Cardiologist:   Candee Furbish, MD (Former Ron Parker)      History of Present Illness: [name] is a 65 y.o. male here for follow-up of atrial fibrillation/flutter. Flutter ablation 10/18/10. Currently has paroxysmal coarse atrial fibrillation. Anticoagulation, no bleeding, no chest pain, no shortness of breath.  He is feeling palpitations about 1-2 times per week this is pretty standard for him. Lesion last between 5-15 minutes. No significant increase.  Prior question history of antiphospholipid antibody syndrome but this is not proven according to the patient.     Past Medical History  Diagnosis Date  . Adenomatous colon polyp   . Palpitations     probable SVT, rate 170,, at home,, lasted one hour,, 2012  . Hypercholesterolemia   . GERD (gastroesophageal reflux disease)   . Alcohol use ([place])   . Hypertension   . Raynaud's syndrome   . TIA (transient ischemic attack)     Possible small vessel tia in the past.  . CRAO (central retinal artery occlusion)   . BPH (benign prostatic hypertrophy)   . Carotid artery disease ([facility])     [id number] R. ICA, 123456 LICA... Doppler.... January, 2010  /  Doppler... in January, 2011 no change  /  doppler1/27/2012...stable  [id number] RICA [id number] LICA  . Tobacco abuse   . Antiphospholipid antibody positive     ???In the past???but not proven according to patient  . Ejection fraction      EF 50-55%, echo, March, 2012... inferobasal and distal septal hypokinesis /      60%, echo, November, 2008  . CAD (coronary artery disease)     Catheterization 2005 disease / catheterization July 06, 2010, chronic total occlusion distal RCA with collaterals from right and left side.  Medical therapy recommended, mild decreased LV function       . Antiphospholipid antibody positive     ??? in the past, but not proven according to the  patient.  . Atrial flutter ([place])     atrial flutter ablation,   Dr.Allred October 18, 2010  . Myocardial infarction ([place])   . Nonsustained ventricular tachycardia [facility])     Past Surgical History  Procedure Laterality Date  . Cardiac catheterization  06/27/2003    2005.... mild irregularity of the LAD..( patient had had abnormal Myoview scan)  . Inguinal hernia repair  2009    right with mesh.  Dr Marlou Starks  . Thyroglossal duct cyst  2006    Dr. Wilburn Cornelia     Current Outpatient Prescriptions  Medication Sig Dispense Refill  . atorvastatin (LIPITOR) 40 MG tablet Take 1 tablet (40 mg total) by mouth daily. 30 tablet 6  . calcium carbonate (TUMS - DOSED IN MG ELEMENTAL CALCIUM) 500 MG chewable tablet Chew 1 tablet by mouth as needed for heartburn.     . diltiazem (CARTIA XT) 120 MG 24 hr capsule Take 1 capsule (120 mg total) by mouth as needed (For elevated heart rate). 30 capsule 6  . lisinopril (PRINIVIL,ZESTRIL) 20 MG tablet TAKE 1 TABLET BY MOUTH EVERY DAY 30 tablet 3  . metoprolol succinate (TOPROL-XL) 25 MG 24 hr tablet TAKE 1 TABLET BY MOUTH EVERY DAY 30 tablet 5  . PRADAXA 150 MG CAPS capsule TAKE 1 CAPSULE BY MOUTH TWICE DAILY 60 capsule 6   No current facility-administered medications for this visit.  Allergies:   Cialis; Levitra; and Viagra    Social History:  The patient  reports that he has been smoking Cigarettes.  He has a 20 pack-year smoking history. He has never used smokeless tobacco. He reports that he drinks about 12.6 oz of alcohol per week. He reports that he does not use illicit drugs.   Family History:  The patient's family history is not on file. He was adopted.    ROS:  Please see the history of present illness.   Otherwise, review of systems are positive for none.   All other systems are reviewed and negative.    PHYSICAL EXAM: VS:  BP 118/70 mmHg  Pulse 70  Ht 6\' 2"  (1.88 m)  Wt 160 lb 12.8 oz (72.938 kg)  BMI 20.64 kg/m2 , BMI Body mass index is 20.64  kg/(m^2). GEN: Well nourished, well developed, in no acute distress HEENT: normal Neck: no JVD, carotid bruits, or masses Cardiac: RRR; no murmurs, rubs, or gallops,no edema  Respiratory:  clear to auscultation bilaterally, normal work of breathing GI: soft, nontender, nondistended, + BS MS: no deformity or atrophy Skin: warm and dry, no rash Neuro:  Strength and sensation are intact Psych: euthymic mood, full affect   EKG:   Today 03/30/15-sinus rhythm, 71, small Q waves inferiorly, otherwise rightward axis personally viewed-no significant change from prior.   Recent Labs: 03/06/2015: ALT 23; BUN 21; Creatinine, Ser 0.98; Hemoglobin 16.4; Platelets 257.0; Potassium 4.7; Sodium 135; TSH 1.94    Lipid Panel    Component Value Date/Time   CHOL 143 03/06/2015 0831   TRIG 67.0 03/06/2015 0831   HDL 61.40 03/06/2015 0831   CHOLHDL 2 03/06/2015 0831   VLDL 13.4 03/06/2015 0831   LDLCALC 68 03/06/2015 0831      Wt Readings from Last 3 Encounters:  03/30/15 160 lb 12.8 oz (72.938 kg)  03/06/15 157 lb 6.4 oz (71.396 kg)  08/16/14 158 lb (71.668 kg)      Other studies Reviewed: Additional studies/ records that were reviewed today include:  Prior labs, echocardiogram, office notes, carotid Doppler. Review of the above records demonstrates:  As above   ASSESSMENT AND PLAN:  Atrial flutter  - post ablation 8/12, Dr. Rayann Heman  Atrial fibrillation  - Paroxysmal, coarse  - CHADS-VASc (HTN, TIA) - 3  - Anticoagulated with Pradaxa  - Basic metabolic profile and CBC every 6 months  - Ejection fraction normal-11/24/13  Chronic anticoagulation  - Pradaxa  - No bleeding noted  Coronary artery disease  - Nuclear stress test stable on 11/25/13. old inferior scar noted - catheterization July 06, 2010, chronic total occlusion distal RCA with collaterals from right and left side. Medical therapy recommended, mild decreased LV function   Carotid artery disease  - 39% right, 59%  left-08/16/14  - 1 year f/u Doppler  - Prevention  Hyperlipidemia  - Atorvastatin 40  - LDL in the 70s. Excellent.   Current medicines are reviewed at length with the patient today.  The patient does not have concerns regarding medicines.  The following changes have been made:  no change  Labs/ tests ordered today include: none  No orders of the defined types were placed in this encounter.     Disposition:  Taite Schoeppner one year  Signed, Candee Furbish, MD  03/30/2015 8:18 AM    [facility] [address], [place], [place]  [address] Phone: [phone]; Fax: [phone]

## 2015-04-09 ENCOUNTER — Other Ambulatory Visit: Payer: Self-pay | Admitting: Cardiology

## 2015-08-20 ENCOUNTER — Other Ambulatory Visit: Payer: Self-pay | Admitting: Cardiology

## 2016-02-20 ENCOUNTER — Ambulatory Visit (INDEPENDENT_AMBULATORY_CARE_PROVIDER_SITE_OTHER): Payer: 59 | Admitting: Family Medicine

## 2016-02-20 ENCOUNTER — Ambulatory Visit (INDEPENDENT_AMBULATORY_CARE_PROVIDER_SITE_OTHER): Payer: 59

## 2016-02-20 VITALS — BP 140/88 | HR 86 | Temp 97.4°F | Resp 17 | Ht 74.0 in | Wt 153.0 lb

## 2016-02-20 DIAGNOSIS — J438 Other emphysema: Secondary | ICD-10-CM

## 2016-02-20 DIAGNOSIS — R61 Generalized hyperhidrosis: Secondary | ICD-10-CM

## 2016-02-20 DIAGNOSIS — I480 Paroxysmal atrial fibrillation: Secondary | ICD-10-CM

## 2016-02-20 LAB — POCT CBC
Granulocyte percent: 61.8 %G (ref 37–80)
HEMATOCRIT: 46.6 % (ref 43.5–53.7)
Hemoglobin: 16.3 g/dL (ref 14.1–18.1)
LYMPH, POC: 2.5 (ref 0.6–3.4)
MCH, POC: 33 pg — AB (ref 27–31.2)
MCHC: 34.9 g/dL (ref 31.8–35.4)
MCV: 94.5 fL (ref 80–97)
MID (cbc): 0.4 (ref 0–0.9)
MPV: 8.8 fL (ref 0–99.8)
POC GRANULOCYTE: 4.8 (ref 2–6.9)
POC LYMPH %: 32.4 % (ref 10–50)
POC MID %: 5.8 %M (ref 0–12)
Platelet Count, POC: 170 10*3/uL (ref 142–424)
RBC: 4.93 M/uL (ref 4.69–6.13)
RDW, POC: 13.5 %
WBC: 7.7 10*3/uL (ref 4.6–10.2)

## 2016-02-20 MED ORDER — BUDESONIDE-FORMOTEROL FUMARATE 80-4.5 MCG/ACT IN AERO: 2.0000 | INHALATION_SPRAY | Freq: Every day | RESPIRATORY_TRACT | 12 refills | Status: DC

## 2016-02-20 MED ORDER — METOPROLOL SUCCINATE ER 25 MG PO TB24: 50.0000 mg | ORAL_TABLET | Freq: Every day | ORAL | 6 refills | Status: DC

## 2016-02-20 MED ORDER — ALBUTEROL SULFATE HFA 108 (90 BASE) MCG/ACT IN AERS: 2.0000 | INHALATION_SPRAY | RESPIRATORY_TRACT | 1 refills | Status: DC | PRN

## 2016-02-20 NOTE — Progress Notes (Signed)
Patient ID: [name], male    DOB: [birth year], 65 y.o.   MRN: [id number]  PCP: Hoyt Koch, MD  Chief Complaint  Patient presents with  . Night Sweats    and irregular heartbeat. Last night. Pulse ox unable to read at time of triage.     Subjective:   HPI 65 year old male presents for evaluation of night sweats, shortness of breath, and elevated heart rate. Pt is new to [facility] and has a significant cardiac hx which includes paroxysmal atrial fibrillation, hypertension, Raynaud's Disease and CAD. He presents newly to me today. Reports awakening last night and drenched in sweat, he felt that his pulse was racing, and he had some shortness of breath. Walked around and sat up in chair which improved symptoms.  Denies chest pain but reports some dizziness. He reports overall decrease in energy and increasing air hunger. Reports feeling feverish. Reports 2-3 weeks ago had a cold and had a similar episode. He has an appointment on Friday with cardiology. Social History   Social History  . Marital status: Married    Spouse name: N/A  . Number of children: N/A  . Years of education: N/A   Occupational History  . land Surveyor    Social History Main Topics  . Smoking status: Current Every Day Smoker    Packs/day: 0.50    Years: 40.00    Types: Cigarettes  . Smokeless tobacco: Never Used     Comment: 1 pack a day  . Alcohol use 12.6 oz/week    21 Cans of beer per week     Comment: he continues to drink heavily despite my recommendation to quit  . Drug use: No  . Sexual activity: Not on file   Other Topics Concern  . Not on file   Social History Narrative  . No narrative on file    [name]   Review of Systems See HPI   Patient Active Problem List   Diagnosis Date Noted  . Routine general medical examination at a health care facility 02/28/2014  . Paroxysmal atrial fibrillation ([place]) 12/05/2013  .  Nonsustained ventricular tachycardia ([place])   . 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) 04/22/2013  . SVT (supraventricular tachycardia) ([place])   . CAD (coronary artery disease)   . Raynaud's syndrome   . TIA (transient ischemic attack)   . CRAO (central retinal artery occlusion)   . Carotid artery disease ([place])   . TOBACCO ABUSE 04/12/2009  . BENIGN PROSTATIC HYPERTROPHY 12/19/2008  . THYROID CYST 07/05/2007  . Hyperlipidemia 07/05/2007  . Alcohol abuse 07/05/2007  . Essential hypertension 07/05/2007  . GERD 07/05/2007     Prior to Admission medications   Medication Sig Start Date End Date Taking? Authorizing Provider  atorvastatin (LIPITOR) 40 MG tablet Take 1 tablet (40 mg total) by mouth daily. 08/28/14  Yes Minna Merritts, MD  atorvastatin (LIPITOR) 40 MG tablet TAKE 1 TABLET BY MOUTH EVERY DAY 04/09/15  Yes Jerline Pain, MD  calcium carbonate (TUMS - DOSED IN MG ELEMENTAL CALCIUM) 500 MG chewable tablet Chew 1 tablet by mouth as needed for heartburn.    Yes Historical Provider, MD  dabigatran (PRADAXA) 150 MG CAPS capsule Take 1 capsule (150 mg total) by mouth 2 (two) times daily. 03/30/15  Yes Jerline Pain, MD  diltiazem (CARTIA XT) 120 MG 24 hr capsule Take 1 capsule (120 mg total) by mouth as needed (  For elevated heart rate). 03/30/15  Yes Jerline Pain, MD  lisinopril (PRINIVIL,ZESTRIL) 20 MG tablet Take 1 tablet (20 mg total) by mouth daily. 03/30/15  Yes Jerline Pain, MD  metoprolol succinate (TOPROL-XL) 25 MG 24 hr tablet TAKE 1 TABLET BY MOUTH EVERY DAY 08/20/15  Yes Jerline Pain, MD     Allergies  Allergen Reactions  . Cialis [Tadalafil] Other (See Comments)    UNKNOWN  . Levitra [Vardenafil] Other (See Comments)    UNKNOWN  . Viagra [Sildenafil Citrate] Other (See Comments)    UNKNOWN     Objective:  Physical Exam  Constitutional: He is oriented to person, place, and time. He appears well-developed and well-nourished.  HENT:  Head: Normocephalic and atraumatic.  Eyes:  Conjunctivae and EOM are normal. Pupils are equal, round, and reactive to light.  Neck: Normal range of motion. Neck supple.  Cardiovascular: Intact distal pulses.   EKG Atrial Fibrillation   Pulmonary/Chest: Effort normal and breath sounds normal.  Musculoskeletal: Normal range of motion.  Lymphadenopathy:    He has no cervical adenopathy.  Neurological: He is alert and oriented to person, place, and time.  Skin: Skin is warm and dry.     Vitals:   02/20/16 1336  BP: 140/88  Pulse: 86  Resp: 17  Temp: 97.4 F (36.3 C)   Dg Chest 2 View  Result Date: 02/20/2016 CLINICAL DATA:  Unexplained night sweats and fatigue EXAM: CHEST  2 VIEW COMPARISON:  None. FINDINGS: The heart size is normal. Emphysematous changes are again noted. No focal airspace disease is present. There is no edema or effusion. IMPRESSION: 1. Emphysema. 2. No acute cardiopulmonary disease. Electronically Signed   By: San Morelle M.D.   On: 02/20/2016 14:12   Assessment & Plan:  1. Paroxysmal atrial fibrillation ([place]), pt has a follow-up scheduled with cardiology on 02/22/16 - EKG 12-Lead Plan: Increase Metoprolol from 25 mg to 50 mg daily until you follow-up with cardiology for rate control.  2. Night sweats, likely related to decreased oxygenation  - POCT CBC - DG Chest 2 View  3. Other emphysema ([place]), likely related long hx and current  tobacco dependence  Plan: -Symbicort Inhaler 2 puffs, once daily -Albuterol Inhaler 2 puffs every 4 hours as needed for shortness of breath.   - Ambulatory referral to [place]. Kenton Kingfisher, MSN, FNP-C [facility]  .

## 2016-02-20 NOTE — Patient Instructions (Addendum)
I've placed a referral for you to see a pulmonologist.  In the meantime, I've placed you on Symbicort inhaler, 2 puffs once daily  For acute shortness of breath, I've placed you on albuterol inhaler 2 puffs every 4 hours as needed for shortness of breath.  I've increased your Metoprolol 50 mg once daily.  IF you received an x-ray today, you will receive an invoice from [facility]. Please contact [facility] at [phone] with questions or concerns regarding your invoice.   IF you received labwork today, you will receive an invoice from [employer]. Please contact [facility] at [phone] with questions or concerns regarding your invoice.   Our billing staff will not be able to assist you with questions regarding bills from these companies.  You will be contacted with the lab results as soon as they are available. The fastest way to get your results is to activate your My Chart account. Instructions are located on the last page of this paperwork. If you have not heard from [place] regarding the results in 2 weeks, please contact this office.     Chronic Obstructive Pulmonary Disease Exacerbation Chronic obstructive pulmonary disease (COPD) is a common lung problem. In COPD, the flow of air from the lungs is limited. COPD exacerbations are times that breathing gets worse and you need extra treatment. Without treatment they can be life threatening. If they happen often, your lungs can become more damaged. If your COPD gets worse, your doctor may treat you with:  Medicines.  Oxygen.  Different ways to clear your airway, such as using a mask. Follow these instructions at home:  Do not smoke.  Avoid tobacco smoke and other things that bother your lungs.  If given, take your antibiotic medicine as told. Finish the medicine even if you start to feel better.  Only take medicines as told by your doctor.  Drink enough fluids to keep your pee (urine)  clear or pale yellow (unless your doctor has told you not to).  Use a cool mist machine (vaporizer).  If you use oxygen or a machine that turns liquid medicine into a mist (nebulizer), continue to use them as told.  Keep up with shots (vaccinations) as told by your doctor.  Exercise regularly.  Eat healthy foods.  Keep all doctor visits as told. Get help right away if:  You are very short of breath and it gets worse.  You have trouble talking.  You have bad chest pain.  You have blood in your spit (sputum).  You have a fever.  You keep throwing up (vomiting).  You feel weak, or you pass out (faint).  You feel confused.  You keep getting worse. This information is not intended to replace advice given to you by your health care provider. Make sure you discuss any questions you have with your health care provider. Document Released: 02/20/2011 Document Revised: 08/09/2015 Document Reviewed: 11/05/2012 Elsevier Interactive Patient Education  2017 [employer].

## 2016-02-21 ENCOUNTER — Encounter: Payer: Self-pay | Admitting: Cardiology

## 2016-02-22 ENCOUNTER — Ambulatory Visit (INDEPENDENT_AMBULATORY_CARE_PROVIDER_SITE_OTHER): Payer: 59 | Admitting: Cardiology

## 2016-02-22 ENCOUNTER — Encounter: Payer: Self-pay | Admitting: Cardiology

## 2016-02-22 VITALS — BP 98/64 | HR 105 | Ht 74.0 in | Wt 154.4 lb

## 2016-02-22 DIAGNOSIS — I480 Paroxysmal atrial fibrillation: Secondary | ICD-10-CM

## 2016-02-22 DIAGNOSIS — I2510 Atherosclerotic heart disease of native coronary artery without angina pectoris: Secondary | ICD-10-CM | POA: Diagnosis not present

## 2016-02-22 DIAGNOSIS — E7800 Pure hypercholesterolemia, unspecified: Secondary | ICD-10-CM | POA: Diagnosis not present

## 2016-02-22 DIAGNOSIS — Z79899 Other long term (current) drug therapy: Secondary | ICD-10-CM

## 2016-02-22 DIAGNOSIS — I6529 Occlusion and stenosis of unspecified carotid artery: Secondary | ICD-10-CM | POA: Diagnosis not present

## 2016-02-22 LAB — CBC
HEMATOCRIT: 49.3 % (ref 38.5–50.0)
Hemoglobin: 16.6 g/dL (ref 13.2–17.1)
MCH: 32.7 pg (ref 27.0–33.0)
MCHC: 33.7 g/dL (ref 32.0–36.0)
MCV: 97.2 fL (ref 80.0–100.0)
MPV: 11.6 fL (ref 7.5–12.5)
Platelets: 218 10*3/uL (ref 140–400)
RBC: 5.07 MIL/uL (ref 4.20–5.80)
RDW: 13.3 % (ref 11.0–15.0)
WBC: 9.3 10*3/uL (ref 3.8–10.8)

## 2016-02-22 LAB — COMPREHENSIVE METABOLIC PANEL
ALK PHOS: 83 U/L (ref 40–115)
ALT: 71 U/L — AB (ref 9–46)
AST: 47 U/L — AB (ref 10–35)
Albumin: 4.2 g/dL (ref 3.6–5.1)
BILIRUBIN TOTAL: 0.9 mg/dL (ref 0.2–1.2)
BUN: 16 mg/dL (ref 7–25)
CALCIUM: 9.6 mg/dL (ref 8.6–10.3)
CO2: 25 mmol/L (ref 20–31)
Chloride: 102 mmol/L (ref 98–110)
Creat: 1.11 mg/dL (ref 0.70–1.25)
Glucose, Bld: 77 mg/dL (ref 65–99)
POTASSIUM: 4.5 mmol/L (ref 3.5–5.3)
Sodium: 138 mmol/L (ref 135–146)
TOTAL PROTEIN: 6.2 g/dL (ref 6.1–8.1)

## 2016-02-22 LAB — LIPID PANEL
CHOL/HDL RATIO: 2.4 ratio (ref <5.0)
CHOLESTEROL: 125 mg/dL (ref <200)
HDL: 52 mg/dL (ref >40)
LDL Cholesterol: 58 mg/dL (ref <100)
Triglycerides: 74 mg/dL (ref <150)
VLDL: 15 mg/dL (ref <30)

## 2016-02-22 MED ORDER — RIVAROXABAN 20 MG PO TABS: 20.0000 mg | ORAL_TABLET | Freq: Every day | ORAL | 6 refills | Status: DC

## 2016-02-22 NOTE — Progress Notes (Signed)
Cardiology Office Note   Date:  02/22/2016   ID:  DAVYON BROSZ, DOB 11-27-1950, MRN [id number]  PCP:  Hoyt Koch, MD  Cardiologist:   Candee Furbish, MD (Former Ron Parker)      History of Present Illness: [name] is a 65 y.o. male here for follow-up of atrial fibrillation/flutter. Flutter ablation 10/18/10. Currently has paroxysmal coarse atrial fibrillation. Anticoagulation, no bleeding.  Previously he felt palpitations about 1-2 times per week this is pretty standard for him. Prior question history of antiphospholipid antibody syndrome but this is not proven according to the patient.  Had sweats Tuesday night, SOB, nausea, Tuesday night. Dripping sweat. Went to urgent care the next day was given some inhalers for possible COPD. He has not taken. He was also instructed to increase his metoprolol but he did not. His blood pressure is fairly low. Currently his heart rate is 104 with atrial fibrillation. Prior EKG in January showed sinus rhythm. He feels as though his atrial fibrillation was contribute into his symptoms.    Past Medical History:  Diagnosis Date  . Adenomatous colon polyp   . Alcohol use   . Antiphospholipid antibody positive    ???In the past???but not proven according to patient  . Antiphospholipid antibody positive    ??? in the past, but not proven according to the patient.  . Atrial flutter ([place])    atrial flutter ablation,   Dr.Allred October 18, 2010  . BPH (benign prostatic hypertrophy)   . CAD (coronary artery disease)    Catheterization 2005 disease / catheterization July 06, 2010, chronic total occlusion distal RCA with collaterals from right and left side.  Medical therapy recommended, mild decreased LV function       . Carotid artery disease (HCC)    [id number] R. ICA, 123456 LICA... Doppler.... January, 2010  /  Doppler... in January, 2011 no change  /  doppler1/27/2012...stable  [id number] RICA [id number] LICA  . CRAO (central retinal artery occlusion)     . Ejection fraction     EF 50-55%, echo, March, 2012... inferobasal and distal septal hypokinesis /      60%, echo, November, 2008  . GERD (gastroesophageal reflux disease)   . Hypercholesterolemia   . Hypertension   . Myocardial infarction   . Nonsustained ventricular tachycardia ([place])   . Palpitations    probable SVT, rate 170,, at home,, lasted one hour,, 2012  . Raynaud's syndrome   . TIA (transient ischemic attack)    Possible small vessel tia in the past.  . Tobacco abuse     Past Surgical History:  Procedure Laterality Date  . CARDIAC CATHETERIZATION  06/27/2003   2005.... mild irregularity of the LAD..( patient had had abnormal Myoview scan)  . INGUINAL HERNIA REPAIR  2009   right with mesh.  Dr Marlou Starks  . THYROGLOSSAL DUCT CYST  2006   Dr. Wilburn Cornelia     Current Outpatient Prescriptions  Medication Sig Dispense Refill  . atorvastatin (LIPITOR) 40 MG tablet TAKE 1 TABLET BY MOUTH EVERY DAY 30 tablet 12  . calcium carbonate (TUMS - DOSED IN MG ELEMENTAL CALCIUM) 500 MG chewable tablet Chew 1 tablet by mouth as needed for heartburn.     . diltiazem (CARTIA XT) 120 MG 24 hr capsule Take 1 capsule (120 mg total) by mouth as needed (For elevated heart rate). 30 capsule 6  . lisinopril (PRINIVIL,ZESTRIL) 20 MG tablet Take 1 tablet (20 mg total) by mouth daily. 30 tablet 11  .  metoprolol succinate (TOPROL-XL) 25 MG 24 hr tablet Take 25 mg by mouth daily.    . rivaroxaban (XARELTO) 20 MG TABS tablet Take 1 tablet (20 mg total) by mouth daily with supper. 30 tablet 6   No current facility-administered medications for this visit.     Allergies:   Cialis [tadalafil]; Levitra [vardenafil]; and Viagra [sildenafil citrate]    Social History:  The patient  reports that he has been smoking Cigarettes.  He has a 20.00 pack-year smoking history. He has never used smokeless tobacco. He reports that he drinks about 12.6 oz of alcohol per week . He reports that he does not use drugs.    Family History:  The patient's family history is not on file. He was adopted.    ROS:  Please see the history of present illness.   Otherwise, review of systems are positive for none.   All other systems are reviewed and negative.    PHYSICAL EXAM: VS:  BP 98/64   Pulse (!) 105   Ht 6\' 2"  (1.88 m)   Wt 154 lb 6.4 oz (70 kg)   BMI 19.82 kg/m  , BMI Body mass index is 19.82 kg/m. GEN: Well nourished, well developed, in no acute distress  HEENT: normal  Neck: no JVD, carotid bruits, or masses Cardiac: Irregularly irregular, mildly tachycardic; no murmurs, rubs, or gallops,no edema  Respiratory:  clear to auscultation bilaterally, normal work of breathing GI: soft, nontender, nondistended, + BS MS: no deformity or atrophy  Skin: warm and dry, no rash, blue nose, Raynaud's changes Neuro:  Strength and sensation are intact Psych: euthymic mood, full affect   EKG:   Today 03/30/15-sinus rhythm, 71, small Q waves inferiorly, otherwise rightward axis personally viewed-no significant change from prior.   Recent Labs: 03/06/2015: ALT 23; BUN 21; Creatinine, Ser 0.98; Platelets 257.0; Potassium 4.7; Sodium 135; TSH 1.94 02/20/2016: Hemoglobin 16.3    Lipid Panel    Component Value Date/Time   CHOL 143 03/06/2015 0831   TRIG 67.0 03/06/2015 0831   HDL 61.40 03/06/2015 0831   CHOLHDL 2 03/06/2015 0831   VLDL 13.4 03/06/2015 0831   LDLCALC 68 03/06/2015 0831      Wt Readings from Last 3 Encounters:  02/22/16 154 lb 6.4 oz (70 kg)  02/20/16 153 lb (69.4 kg)  03/30/15 160 lb 12.8 oz (72.9 kg)      Other studies Reviewed: Additional studies/ records that were reviewed today include:  Prior labs, echocardiogram, office notes, carotid Doppler. Review of the above records demonstrates:  As above   ASSESSMENT AND PLAN:  Atrial flutter  - post ablation 8/12, Dr. Rayann Heman  Atrial fibrillation  - Paroxysmal, coarse. This has returned. Prior EKG in January 2017 was sinus rhythm.  He seems to be symptomatic with this currently. We will set him up for cardioversion after taking Xarelto for 3 weeks straight.  - CHADS-VASc (HTN, TIA) - 3  - Anticoagulated previously with Pradaxa but missed several second daily doses. For symptom location, we will change him to Xarelto. I've expressed the importance of taking this medication.  - Complete metabolic profile and CBC every 6 months  - Ejection fraction normal-11/24/13, we will repeat echocardiogram given this new bout of atrial fibrillation  - He understands that if he has been in atrial fibrillation for quite some time, may be challenging for [place] to establish sinus rhythm. He is interested in establishing sinus rhythm. He may require further antiarrhythmic medication such as Tikosyn in the  future. If cardioversion does not hold, I will likely send back to Dr. Rayann Heman for further discussion and possibility of future ablation efforts. Rechecking echocardiogram. Understands that alcohol can cause worsening atrial fibrillation. Risks and benefits have been discussed.  Chronic anticoagulation  - Pradaxa changed to Xarelto because of compliance issues.  - No bleeding noted  Coronary artery disease  - Nuclear stress test stable on 11/25/13. old inferior scar noted - catheterization July 06, 2010, chronic total occlusion distal RCA with collaterals from right and left side. Medical therapy recommended, mild decreased LV function   Carotid artery disease  - 39% right, 59% left-08/16/14  - 1 year f/u Doppler-he is due for this. We will help set up Dopplers.  - Prevention  Hyperlipidemia  - Atorvastatin 40  - LDL in the 70s. Excellent. I will check lipid panel and liver function today.  Tobacco use  - Encourage cessation.  Current medicines are reviewed at length with the patient today.  The patient does not have concerns regarding medicines.  The following changes have been made:  no change  Labs/ tests ordered today include: none    Orders Placed This Encounter  Procedures  . CBC  . Comprehensive metabolic panel  . Lipid panel  . ECHOCARDIOGRAM COMPLETE     Disposition:  [place] one year  Signed, Candee Furbish, MD  02/22/2016 9:28 AM    [facility] [place], [place],   [address] Phone: [phone]; Fax: [phone]

## 2016-02-22 NOTE — Patient Instructions (Addendum)
Medication Instructions:  Please stop your Pradaxa and start Xarelto 20 mg a day. Continue all other medications as listed.  Labwork: Please have blood work today. (CBC, CMP and Lipid profile)  Testing/Procedures: Your physician has requested that you have an echocardiogram. Echocardiography is a painless test that uses sound waves to create images of your heart. It provides your doctor with information about the size and shape of your heart and how well your heart's chambers and valves are working. This procedure takes approximately one hour. There are no restrictions for this procedure.  Your physician has requested that you have a carotid duplex. This test is an ultrasound of the carotid arteries in your neck. It looks at blood flow through these arteries that supply the brain with blood. Allow one hour for this exam. There are no restrictions or special instructions.  Your physician has requested that you have a Cardioversion (will be due in the 1st week of January).  Electrical Cardioversion uses a jolt of electricity to your heart either through paddles or wired patches attached to your chest. This is a controlled, usually prescheduled, procedure. This procedure is done at the hospital and you are not awake during the procedure. You usually go home the day of the procedure. Please see the instruction sheet given to you today for more information.  Follow-Up: Follow up after your cardioversion.  If you need a refill on your cardiac medications before your next appointment, please call your pharmacy.  Thank you for choosing [facility]!!

## 2016-02-25 NOTE — Addendum Note (Signed)
Addended by: Domenica Reamer R on: 02/25/2016 10:27 AM   Modules accepted: Orders

## 2016-02-26 ENCOUNTER — Other Ambulatory Visit: Payer: Self-pay | Admitting: *Deleted

## 2016-02-26 DIAGNOSIS — R7989 Other specified abnormal findings of blood chemistry: Secondary | ICD-10-CM

## 2016-02-26 DIAGNOSIS — R945 Abnormal results of liver function studies: Principal | ICD-10-CM

## 2016-02-27 ENCOUNTER — Ambulatory Visit (HOSPITAL_COMMUNITY)
Admission: RE | Admit: 2016-02-27 | Discharge: 2016-02-27 | Disposition: A | Discharge status: Home / Self Care | Payer: 59 | Source: Ambulatory Visit | Attending: Cardiovascular Disease | Admitting: Cardiovascular Disease

## 2016-02-27 DIAGNOSIS — I6523 Occlusion and stenosis of bilateral carotid arteries: Secondary | ICD-10-CM | POA: Insufficient documentation

## 2016-02-27 DIAGNOSIS — I6529 Occlusion and stenosis of unspecified carotid artery: Secondary | ICD-10-CM | POA: Diagnosis present

## 2016-03-04 ENCOUNTER — Inpatient Hospital Stay (HOSPITAL_COMMUNITY)
Admission: EM | Admit: 2016-03-04 | Discharge: 2016-03-07 | DRG: 291 | Disposition: A | Discharge status: Home / Self Care | Payer: 59 | Attending: Internal Medicine | Admitting: Internal Medicine

## 2016-03-04 ENCOUNTER — Other Ambulatory Visit: Payer: Self-pay

## 2016-03-04 ENCOUNTER — Telehealth: Payer: Self-pay | Admitting: *Deleted

## 2016-03-04 DIAGNOSIS — N179 Acute kidney failure, unspecified: Secondary | ICD-10-CM | POA: Diagnosis present

## 2016-03-04 DIAGNOSIS — I4892 Unspecified atrial flutter: Secondary | ICD-10-CM | POA: Diagnosis present

## 2016-03-04 DIAGNOSIS — I110 Hypertensive heart disease with heart failure: Principal | ICD-10-CM | POA: Diagnosis present

## 2016-03-04 DIAGNOSIS — I4811 Longstanding persistent atrial fibrillation: Secondary | ICD-10-CM | POA: Diagnosis present

## 2016-03-04 DIAGNOSIS — J439 Emphysema, unspecified: Secondary | ICD-10-CM | POA: Diagnosis present

## 2016-03-04 DIAGNOSIS — F1010 Alcohol abuse, uncomplicated: Secondary | ICD-10-CM | POA: Diagnosis present

## 2016-03-04 DIAGNOSIS — Z7901 Long term (current) use of anticoagulants: Secondary | ICD-10-CM

## 2016-03-04 DIAGNOSIS — I2510 Atherosclerotic heart disease of native coronary artery without angina pectoris: Secondary | ICD-10-CM | POA: Diagnosis present

## 2016-03-04 DIAGNOSIS — E7800 Pure hypercholesterolemia, unspecified: Secondary | ICD-10-CM | POA: Diagnosis present

## 2016-03-04 DIAGNOSIS — I509 Heart failure, unspecified: Secondary | ICD-10-CM

## 2016-03-04 DIAGNOSIS — N400 Enlarged prostate without lower urinary tract symptoms: Secondary | ICD-10-CM | POA: Diagnosis present

## 2016-03-04 DIAGNOSIS — I252 Old myocardial infarction: Secondary | ICD-10-CM

## 2016-03-04 DIAGNOSIS — F17200 Nicotine dependence, unspecified, uncomplicated: Secondary | ICD-10-CM | POA: Diagnosis present

## 2016-03-04 DIAGNOSIS — I5023 Acute on chronic systolic (congestive) heart failure: Secondary | ICD-10-CM | POA: Diagnosis present

## 2016-03-04 DIAGNOSIS — F17210 Nicotine dependence, cigarettes, uncomplicated: Secondary | ICD-10-CM | POA: Diagnosis present

## 2016-03-04 DIAGNOSIS — I4891 Unspecified atrial fibrillation: Secondary | ICD-10-CM

## 2016-03-04 DIAGNOSIS — J9601 Acute respiratory failure with hypoxia: Secondary | ICD-10-CM | POA: Diagnosis present

## 2016-03-04 DIAGNOSIS — E785 Hyperlipidemia, unspecified: Secondary | ICD-10-CM | POA: Diagnosis present

## 2016-03-04 DIAGNOSIS — H3410 Central retinal artery occlusion, unspecified eye: Secondary | ICD-10-CM | POA: Diagnosis present

## 2016-03-04 DIAGNOSIS — Z8673 Personal history of transient ischemic attack (TIA), and cerebral infarction without residual deficits: Secondary | ICD-10-CM

## 2016-03-04 DIAGNOSIS — I7300 Raynaud's syndrome without gangrene: Secondary | ICD-10-CM | POA: Diagnosis present

## 2016-03-04 DIAGNOSIS — I481 Persistent atrial fibrillation: Secondary | ICD-10-CM | POA: Diagnosis present

## 2016-03-04 DIAGNOSIS — Z888 Allergy status to other drugs, medicaments and biological substances status: Secondary | ICD-10-CM

## 2016-03-04 DIAGNOSIS — I480 Paroxysmal atrial fibrillation: Secondary | ICD-10-CM | POA: Diagnosis present

## 2016-03-04 DIAGNOSIS — K219 Gastro-esophageal reflux disease without esophagitis: Secondary | ICD-10-CM | POA: Diagnosis present

## 2016-03-04 DIAGNOSIS — I10 Essential (primary) hypertension: Secondary | ICD-10-CM | POA: Diagnosis present

## 2016-03-04 HISTORY — DX: Heart failure, unspecified: I50.9

## 2016-03-04 MED ORDER — METOPROLOL TARTRATE 5 MG/5ML IV SOLN
2.5000 mg | Freq: Once | INTRAVENOUS | Status: AC
  Administered 2016-03-05: 2.5 mg via INTRAVENOUS
  Filled 2016-03-04: qty 5

## 2016-03-04 NOTE — Telephone Encounter (Signed)
-----   Message from Jerline Pain, MD sent at 02/29/2016  6:09 AM EST ----- Heterogeneous plaque, bilaterally.  1-39% bilateral ICA stenosis.  >50% RECA stenosis.  Normal subclavian arteries, bilaterally.  Patent vertebral arteries with antegrade flow.  F/u PRN  Candee Furbish, MD

## 2016-03-04 NOTE — ED Provider Notes (Signed)
[facility] DEPT Provider Note   CSN: [id number] Arrival date & time: 03/04/16  2322  By signing my name below, I, Macon Large, attest that this documentation has been prepared under the direction and in the presence of Delora Fuel, MD. Electronically Signed: Macon Large, ED Scribe. 03/04/16. 11:38 PM.  History   Chief Complaint Chief Complaint  Patient presents with  . Atrial Fibrillation   The history is provided by the patient. No language interpreter was used.   HPI Comments: [name] is a 65 y.o. male with PMHx of Atrail flutter, CAD, Cartoid artery disease, CRAO, HTN, myocardial infraction, who presents to the Emergency Department complaining of moderate, constant, A-fib onset 10 days ago. He reports associated trouble breathing followed by sweats. Pt notes he woke up earlier this morning and found himself having a hard time breathing. He states he went into A-fib after visiting his cardiologist about two weeks ago. No alleviating factors noted. Pt notes taking Xarelto to avoid blood clots. He also reports taking Metoprolol 25mg  per day. He denies CP, SOB, nausea and vomiting. No additional complaints at this time.   Past Medical History:  Diagnosis Date  . Adenomatous colon polyp   . Alcohol use   . Antiphospholipid antibody positive    ???In the past???but not proven according to patient  . Antiphospholipid antibody positive    ??? in the past, but not proven according to the patient.  . Atrial flutter ([place])    atrial flutter ablation,   Dr.Allred October 18, 2010  . BPH (benign prostatic hypertrophy)   . CAD (coronary artery disease)    Catheterization 2005 disease / catheterization July 06, 2010, chronic total occlusion distal RCA with collaterals from right and left side.  Medical therapy recommended, mild decreased LV function       . Carotid artery disease (HCC)    [id number] R. ICA, 123456 LICA... Doppler.... January, 2010  /  Doppler... in January, 2011 no  change  /  doppler1/27/2012...stable  [id number] RICA [id number] LICA  . CRAO (central retinal artery occlusion)   . Ejection fraction     EF 50-55%, echo, March, 2012... inferobasal and distal septal hypokinesis /      60%, echo, November, 2008  . GERD (gastroesophageal reflux disease)   . Hypercholesterolemia   . Hypertension   . Myocardial infarction   . Nonsustained ventricular tachycardia ([place])   . Palpitations    probable SVT, rate 170,, at home,, lasted one hour,, 2012  . Raynaud's syndrome   . TIA (transient ischemic attack)    Possible small vessel tia in the past.  . Tobacco abuse     Patient Active Problem List   Diagnosis Date Noted  . Routine general medical examination at a health care facility 02/28/2014  . Paroxysmal atrial fibrillation ([place]) 12/05/2013  . Nonsustained ventricular tachycardia ([place])   . 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 SVT (supraventricular tachycardia) ([place])   . CAD (coronary artery disease)   . Raynaud's syndrome   . TIA (transient ischemic attack)   . CRAO (central retinal artery occlusion)   . Carotid artery disease ([place])   . TOBACCO ABUSE 04/12/2009  . BENIGN PROSTATIC HYPERTROPHY 12/19/2008  . THYROID CYST 07/05/2007  . Hyperlipidemia 07/05/2007  . Alcohol abuse 07/05/2007  . Essential hypertension 07/05/2007  . GERD 07/05/2007    Past Surgical History:  Procedure Laterality Date  . CARDIAC CATHETERIZATION  06/27/2003   2005.... mild irregularity of the LAD..( patient had had  abnormal Myoview scan)  . INGUINAL HERNIA REPAIR  2009   right with mesh.  Dr Marlou Starks  . THYROGLOSSAL DUCT CYST  2006   Dr. Wilburn Cornelia       Home Medications    Prior to Admission medications   Medication Sig Start Date End Date Taking? Authorizing Provider  atorvastatin (LIPITOR) 40 MG tablet TAKE 1 TABLET BY MOUTH EVERY DAY 04/09/15   Jerline Pain, MD  calcium carbonate (TUMS - DOSED IN MG ELEMENTAL CALCIUM) 500 MG chewable tablet Chew 1 tablet by  mouth as needed for heartburn.     Historical Provider, MD  diltiazem (CARTIA XT) 120 MG 24 hr capsule Take 1 capsule (120 mg total) by mouth as needed (For elevated heart rate). 03/30/15   Jerline Pain, MD  lisinopril (PRINIVIL,ZESTRIL) 20 MG tablet Take 1 tablet (20 mg total) by mouth daily. 03/30/15   Jerline Pain, MD  metoprolol succinate (TOPROL-XL) 25 MG 24 hr tablet Take 25 mg by mouth daily.    Historical Provider, MD  rivaroxaban (XARELTO) 20 MG TABS tablet Take 1 tablet (20 mg total) by mouth daily with supper. 02/22/16   Jerline Pain, MD    Family History [name]    Social History Social History  Substance Use Topics  . Smoking status: Current Every Day Smoker    Packs/day: 0.50    Years: 40.00    Types: Cigarettes  . Smokeless tobacco: Never Used     Comment: 1 pack a day  . Alcohol use 12.6 oz/week    21 Cans of beer per week     Comment: he continues to drink heavily despite my recommendation to quit     Allergies   Cialis [tadalafil]; Levitra [vardenafil]; and Viagra [sildenafil citrate]   Review of Systems Review of Systems  Respiratory: Negative for shortness of breath.        + trouble breathing   Cardiovascular: Negative for chest pain.  Gastrointestinal: Negative for nausea and vomiting.  All other systems reviewed and are negative.    Physical Exam Updated Vital Signs BP 132/89 (BP Location: Right Arm)   Pulse 105   Temp 97.8 F (36.6 C) (Oral)   Resp 24   SpO2 93%   Physical Exam  Constitutional: He is oriented to person, place, and time. He appears well-developed and well-nourished.  HENT:  Head: Normocephalic and atraumatic.  Eyes: EOM are normal. Pupils are equal, round, and reactive to light.  Neck: Normal range of motion. Neck supple. No JVD present.  Cardiovascular: Normal heart sounds.   No murmur heard. Tachycardiac and irregular.   Pulmonary/Chest: Effort normal and breath  sounds normal. He has no wheezes. He has no rales. He exhibits no tenderness.  Abdominal: Soft. Bowel sounds are normal. He exhibits no distension and no mass. There is no tenderness.  Musculoskeletal: Normal range of motion. He exhibits no edema.  Lymphadenopathy:    He has no cervical adenopathy.  Neurological: He is alert and oriented to person, place, and time. No cranial nerve deficit. He exhibits normal muscle tone. Coordination normal.  Skin: Skin is warm and dry. No rash noted.  Psychiatric: He has a normal mood and affect. His behavior is normal. Judgment and thought content normal.  Nursing note and vitals reviewed.    ED Treatments / Results   DIAGNOSTIC STUDIES: Oxygen Saturation is 93% on RA, low by my interpretation.  COORDINATION OF CARE: 11:35 PM Discussed treatment plan with pt at bedside which includes labs, chest XR, beta blocker medication and pt agreed to plan.   Labs (all labs ordered are listed, but only abnormal results are displayed) Labs Reviewed  BASIC METABOLIC PANEL - Abnormal; Notable for the following:       Result Value   Glucose, Bld 104 (*)    All other components within normal limits  BRAIN NATRIURETIC PEPTIDE - Abnormal; Notable for the following:    B Natriuretic Peptide 322.6 (*)    All other components within normal limits  CBC WITH DIFFERENTIAL/PLATELET  TROPONIN I    EKG  EKG Interpretation  Date/Time:  Tuesday March 04 2016 23:18:39 EST Ventricular Rate:  112 PR Interval:    QRS Duration: 103 QT Interval:  351 QTC Calculation: 453 R Axis:   96 Text Interpretation:  Atrial fibrillation Anterolateral infarct, old Rightward axis When compared with ECG of 09/05/2013, Atrial fibrillation has replaced Sinus rhythm Rightward axis is now Present Confirmed by [facility]  MD, Eleuterio ([id number]) on 03/04/2016 11:26:29 PM       Radiology Dg Chest 2 View  Result Date: 03/05/2016 CLINICAL DATA:  Initial evaluation for acute shortness of  breath. EXAM: CHEST  2 VIEW COMPARISON:  Prior radiograph from 02/20/2016. FINDINGS: Cardiac and mediastinal silhouettes are stable in size and contour, and remain within normal limits. Aortic atherosclerosis noted. Lungs are hyperinflated with emphysematous changes present. No focal infiltrates identified. There is subtly increased vascular congestion with interstitial prominence as compared to previous exam, suggesting mild superimposed interstitial edema. No pleural effusion. No pneumothorax. No acute osseous abnormality.  Bones are diffusely osteopenic. IMPRESSION: 1. Emphysema. Diffuse increased vascular an interstitial prominence as compared to recent radiograph from 02/20/2016, suggesting superimposed mild pulmonary interstitial edema/congestion. 2. No other active cardiopulmonary disease identified. Electronically Signed   By: Jeannine Boga M.D.   On: 03/05/2016 00:16    Procedures Procedures (including critical care time)  Medications Ordered in ED Medications  metoprolol (LOPRESSOR) injection 2.5 mg (2.5 mg Intravenous Given 03/05/16 0016)  furosemide (LASIX) injection 40 mg (40 mg Intravenous Given 03/05/16 0158)     Initial Impression / Assessment and Plan / ED Course  I have reviewed the triage vital signs and the nursing notes.  Pertinent labs & imaging results that were available during my care of the patient were reviewed by me and considered in my medical decision making (see chart for details).  Clinical Course    Persistent atrial fibrillation. Old records are reviewed, and he has a long history of paroxysmal atrial fibrillation. He was seen by his cardiologist on December 8 and was scheduled for cardioversion. He had been noncompliant with her doxazosin was switched to Xarelto and conversion was being delayed until he had been on the Xarelto for 3 weeks. Concern today is rate controlled. He has diltiazem which she is to take when necessary but has not taken. He'll be  given a dose of metoprolol.  Heart rate has come down adequately with metoprolol. Laboratory workup shows elevated BNP and chest x-ray suggestive of CHF. This is probably related to decreased cardiac output from atrial fibrillation. Patient is ambulated with pulse oximetry and he does desaturate into the 80s so he cannot go home. Is given a dose of furosemide. Case is discussed with Dr. Myna Hidalgo of [facility] hospitalists who agrees to admit the patient for observation status.  Final Clinical Impressions(s) / ED Diagnoses   Final diagnoses:  Atrial fibrillation  with RVR ([place])  Acute congestive heart failure, unspecified congestive heart failure type [facility])    New Prescriptions New Prescriptions   No medications on file    I personally performed the services described in this documentation, which was scribed in my presence. The recorded information has been reviewed and is accurate.       Delora Fuel, MD [phone]

## 2016-03-05 ENCOUNTER — Emergency Department (HOSPITAL_COMMUNITY): Payer: 59

## 2016-03-05 ENCOUNTER — Observation Stay (HOSPITAL_BASED_OUTPATIENT_CLINIC_OR_DEPARTMENT_OTHER): Payer: 59

## 2016-03-05 DIAGNOSIS — F1010 Alcohol abuse, uncomplicated: Secondary | ICD-10-CM | POA: Diagnosis not present

## 2016-03-05 DIAGNOSIS — R9431 Abnormal electrocardiogram [ECG] [EKG]: Secondary | ICD-10-CM

## 2016-03-05 DIAGNOSIS — I2510 Atherosclerotic heart disease of native coronary artery without angina pectoris: Secondary | ICD-10-CM | POA: Diagnosis not present

## 2016-03-05 DIAGNOSIS — I509 Heart failure, unspecified: Secondary | ICD-10-CM | POA: Diagnosis not present

## 2016-03-05 DIAGNOSIS — I480 Paroxysmal atrial fibrillation: Secondary | ICD-10-CM | POA: Diagnosis not present

## 2016-03-05 DIAGNOSIS — I5031 Acute diastolic (congestive) heart failure: Secondary | ICD-10-CM | POA: Diagnosis not present

## 2016-03-05 LAB — BASIC METABOLIC PANEL
Anion gap: 6 (ref 5–15)
BUN: 17 mg/dL (ref 6–20)
CHLORIDE: 108 mmol/L (ref 101–111)
CO2: 22 mmol/L (ref 22–32)
CREATININE: 1.09 mg/dL (ref 0.61–1.24)
Calcium: 9 mg/dL (ref 8.9–10.3)
GFR calc Af Amer: >60 mL/min (ref >60)
GFR calc non Af Amer: >60 mL/min (ref >60)
GLUCOSE: 104 mg/dL — AB (ref 65–99)
POTASSIUM: 4.4 mmol/L (ref 3.5–5.1)
Sodium: 136 mmol/L (ref 135–145)

## 2016-03-05 LAB — CBC WITH DIFFERENTIAL/PLATELET
Basophils Absolute: 0.1 10*3/uL (ref 0.0–0.1)
Basophils Relative: 1 %
EOS ABS: 0.2 10*3/uL (ref 0.0–0.7)
EOS PCT: 2 %
HCT: 44.6 % (ref 39.0–52.0)
Hemoglobin: 15.7 g/dL (ref 13.0–17.0)
LYMPHS ABS: 2.6 10*3/uL (ref 0.7–4.0)
LYMPHS PCT: 30 %
MCH: 33 pg (ref 26.0–34.0)
MCHC: 35.2 g/dL (ref 30.0–36.0)
MCV: 93.7 fL (ref 78.0–100.0)
MONO ABS: 0.6 10*3/uL (ref 0.1–1.0)
Monocytes Relative: 7 %
Neutro Abs: 5.3 10*3/uL (ref 1.7–7.7)
Neutrophils Relative %: 60 %
PLATELETS: 191 10*3/uL (ref 150–400)
RBC: 4.76 MIL/uL (ref 4.22–5.81)
RDW: 13.1 % (ref 11.5–15.5)
WBC: 8.7 10*3/uL (ref 4.0–10.5)

## 2016-03-05 LAB — MAGNESIUM: MAGNESIUM: 1.9 mg/dL (ref 1.7–2.4)

## 2016-03-05 LAB — TROPONIN I: TROPONIN I: 0.03 ng/mL — AB (ref <0.03)

## 2016-03-05 LAB — BRAIN NATRIURETIC PEPTIDE: B Natriuretic Peptide: 322.6 pg/mL — ABNORMAL HIGH (ref 0.0–100.0)

## 2016-03-05 LAB — ECHOCARDIOGRAM COMPLETE
Height: 74 in
WEIGHTICAEL: 2272 [oz_av]

## 2016-03-05 MED ORDER — LORAZEPAM 1 MG PO TABS: 1.0000 mg | ORAL_TABLET | Freq: Four times a day (QID) | ORAL | Status: DC | PRN

## 2016-03-05 MED ORDER — THIAMINE HCL 100 MG/ML IJ SOLN: 100.0000 mg | Freq: Every day | INTRAMUSCULAR | Status: DC

## 2016-03-05 MED ORDER — ATORVASTATIN CALCIUM 40 MG PO TABS
40.0000 mg | ORAL_TABLET | Freq: Every day | ORAL | Status: DC
  Administered 2016-03-05 – 2016-03-07 (×2): 40 mg via ORAL
  Filled 2016-03-05 (×2): qty 1

## 2016-03-05 MED ORDER — FUROSEMIDE 10 MG/ML IJ SOLN
20.0000 mg | Freq: Once | INTRAMUSCULAR | Status: AC
  Administered 2016-03-05: 20 mg via INTRAVENOUS
  Filled 2016-03-05: qty 2

## 2016-03-05 MED ORDER — SODIUM CHLORIDE 0.9% FLUSH: 3.0000 mL | INTRAVENOUS | Status: DC | PRN

## 2016-03-05 MED ORDER — SODIUM CHLORIDE 0.9 % IV SOLN
INTRAVENOUS | Status: DC
  Administered 2016-03-06: 11:00:00 via INTRAVENOUS

## 2016-03-05 MED ORDER — ONDANSETRON HCL 4 MG/2ML IJ SOLN
4.0000 mg | Freq: Four times a day (QID) | INTRAMUSCULAR | Status: DC | PRN
Start: 2016-03-05 — End: 2016-03-07

## 2016-03-05 MED ORDER — IPRATROPIUM-ALBUTEROL 0.5-2.5 (3) MG/3ML IN SOLN: 3.0000 mL | RESPIRATORY_TRACT | Status: DC | PRN

## 2016-03-05 MED ORDER — FUROSEMIDE 10 MG/ML IJ SOLN
40.0000 mg | Freq: Once | INTRAMUSCULAR | Status: AC
  Administered 2016-03-05: 40 mg via INTRAVENOUS
  Filled 2016-03-05: qty 4

## 2016-03-05 MED ORDER — SODIUM CHLORIDE 0.9% FLUSH
3.0000 mL | Freq: Two times a day (BID) | INTRAVENOUS | Status: DC
  Administered 2016-03-05 – 2016-03-06 (×2): 3 mL via INTRAVENOUS

## 2016-03-05 MED ORDER — LORAZEPAM 2 MG/ML IJ SOLN: 1.0000 mg | Freq: Four times a day (QID) | INTRAMUSCULAR | Status: DC | PRN

## 2016-03-05 MED ORDER — LISINOPRIL 20 MG PO TABS
20.0000 mg | ORAL_TABLET | Freq: Every day | ORAL | Status: DC
  Administered 2016-03-05: 20 mg via ORAL
  Filled 2016-03-05: qty 1

## 2016-03-05 MED ORDER — LISINOPRIL 10 MG PO TABS
10.0000 mg | ORAL_TABLET | Freq: Every day | ORAL | Status: DC
  Administered 2016-03-06 – 2016-03-07 (×2): 10 mg via ORAL
  Filled 2016-03-05 (×2): qty 1

## 2016-03-05 MED ORDER — ADULT MULTIVITAMIN W/MINERALS CH
1.0000 | ORAL_TABLET | Freq: Every day | ORAL | Status: DC
Start: 2016-03-05 — End: 2016-03-07
  Administered 2016-03-05 – 2016-03-07 (×3): 1 via ORAL
  Filled 2016-03-05 (×3): qty 1

## 2016-03-05 MED ORDER — NICOTINE 21 MG/24HR TD PT24: 21.0000 mg | MEDICATED_PATCH | Freq: Every day | TRANSDERMAL | Status: DC | PRN

## 2016-03-05 MED ORDER — RIVAROXABAN 20 MG PO TABS
20.0000 mg | ORAL_TABLET | Freq: Every day | ORAL | Status: DC
  Administered 2016-03-05 – 2016-03-06 (×2): 20 mg via ORAL
  Filled 2016-03-05 (×2): qty 1

## 2016-03-05 MED ORDER — DILTIAZEM HCL 30 MG PO TABS
30.0000 mg | ORAL_TABLET | Freq: Four times a day (QID) | ORAL | Status: DC
  Administered 2016-03-05 – 2016-03-06 (×3): 30 mg via ORAL
  Filled 2016-03-05 (×3): qty 1

## 2016-03-05 MED ORDER — FOLIC ACID 1 MG PO TABS
1.0000 mg | ORAL_TABLET | Freq: Every day | ORAL | Status: DC
  Administered 2016-03-05 – 2016-03-07 (×3): 1 mg via ORAL
  Filled 2016-03-05 (×3): qty 1

## 2016-03-05 MED ORDER — METOPROLOL SUCCINATE ER 25 MG PO TB24
25.0000 mg | ORAL_TABLET | Freq: Every day | ORAL | Status: DC
  Administered 2016-03-05 – 2016-03-06 (×2): 25 mg via ORAL
  Filled 2016-03-05 (×2): qty 1

## 2016-03-05 MED ORDER — SODIUM CHLORIDE 0.9 % IV SOLN
250.0000 mL | INTRAVENOUS | Status: DC | PRN
  Administered 2016-03-06: 12:00:00 via INTRAVENOUS

## 2016-03-05 MED ORDER — CALCIUM CARBONATE ANTACID 500 MG PO CHEW: 1.0000 | CHEWABLE_TABLET | ORAL | Status: DC | PRN

## 2016-03-05 MED ORDER — ACETAMINOPHEN 325 MG PO TABS: 650.0000 mg | ORAL_TABLET | ORAL | Status: DC | PRN

## 2016-03-05 MED ORDER — PANTOPRAZOLE SODIUM 40 MG PO TBEC
40.0000 mg | DELAYED_RELEASE_TABLET | Freq: Every day | ORAL | Status: DC
  Administered 2016-03-05 – 2016-03-07 (×3): 40 mg via ORAL
  Filled 2016-03-05 (×3): qty 1

## 2016-03-05 MED ORDER — SODIUM CHLORIDE 0.9 % IV SOLN: 250.0000 mL | INTRAVENOUS | Status: DC

## 2016-03-05 MED ORDER — VITAMIN B-1 100 MG PO TABS
100.0000 mg | ORAL_TABLET | Freq: Every day | ORAL | Status: DC
  Administered 2016-03-05 – 2016-03-07 (×3): 100 mg via ORAL
  Filled 2016-03-05 (×3): qty 1

## 2016-03-05 NOTE — ED Notes (Addendum)
Pt Sp02 in room while laying down 94% on room air. Pt ambulated down hall from room A13. Pt Sp02 reading gradually dropped from 94% to 80% being the lowest reading. Pt and this EMT stopped at nurses station and connected pt to an additional pulse Ox for accuracy. When asked if he felt SOB, pt stated he felt slightly SOB but not too much. Pt and this EMT ambulated back to A13. When reaching A13 door, pts Sp02 reading stated 97% on room air. Pt stated that he has raynauds however that he "felt pretty warm". MD and RN informed.

## 2016-03-05 NOTE — H&P (Addendum)
History and Physical    [name] [id number] DOB: February 03, 1951 DOA: 03/04/2016  Referring MD/NP/PA: Dr. Delora Fuel PCP: Hoyt Koch, MD  Patient coming from: Home  Chief Complaint: Shortness of breath  HPI: [name] is a 65 y.o. male with medical history significant of HTN, HLD, CAD, PAF on anticoagulation; who presents after being woken out of his sleep with acute onset of shortness of breath last night around 10:30 PM. Associated symptoms included palpitations, cramps, and diaphoresis. Denies having any chest pain symptoms, nausea, vomiting, fever, chills, leg swelling, change in weight. Wife noted that his blood pressure was as high as 170/100 and heart rates were into the 160s. He did not try anything to relieve symptoms. Patient has a prescription for diltiazem to take as needed, but this was not done in the acute setting and his wife immediately called EMS. Patient has been in persistent atrial fibrillation for more than 10 days now. Followed by Dr. [name] of cardiology in the outpatient setting last seen on 12/8. At that time he was switched from Pradxa to Xarelto for poor compliance with taking medication twice daily. It was decided that they would consider possible cardioversion sometime during the first week in January. Patient reports that he's been taking his medications as prescribed. Patient's wife gives history of continued tobacco abuse and alcohol abuse of anywhere from 2-3+ drinks per night on average.  ED Course: En route the patient was reportedly given 2 doses of diltiazem. Upon admission heart rates were seen to be in the 110's and the patient was given 2.5 mg IV of metoprolol. Lab work revealed elevated BNP of 322.6. CXR showing signs of vascular congestion. Patient was given 40 mg of Lasix IV.  Review of Systems: As per HPI otherwise 10 point review of systems negative.   Past Medical History:  Diagnosis Date  . Adenomatous colon polyp   . Alcohol use    . Antiphospholipid antibody positive    ???In the past???but not proven according to patient  . Antiphospholipid antibody positive    ??? in the past, but not proven according to the patient.  . Atrial flutter ([place])    atrial flutter ablation,   Dr.Allred October 18, 2010  . BPH (benign prostatic hypertrophy)   . CAD (coronary artery disease)    Catheterization 2005 disease / catheterization July 06, 2010, chronic total occlusion distal RCA with collaterals from right and left side.  Medical therapy recommended, mild decreased LV function       . Carotid artery disease (HCC)    [id number] R. ICA, [id number] LICA... Doppler.... January, 2010  /  Doppler... in January, 2011 no change  /  doppler1/27/2012...stable  [id number] RICA [id number] LICA  . CRAO (central retinal artery occlusion)   . Ejection fraction     EF 50-55%, echo, March, 2012... inferobasal and distal septal hypokinesis /      60%, echo, November, 2008  . GERD (gastroesophageal reflux disease)   . Hypercholesterolemia   . Hypertension   . Myocardial infarction   . Nonsustained ventricular tachycardia ([place])   . Palpitations    probable SVT, rate 170,, at home,, lasted one hour,, 2012  . Raynaud's syndrome   . TIA (transient ischemic attack)    Possible small vessel tia in the past.  . Tobacco abuse     Past Surgical History:  Procedure Laterality Date  . CARDIAC CATHETERIZATION  06/27/2003   2005.... mild irregularity of the LAD..( patient had had  abnormal Myoview scan)  . INGUINAL HERNIA REPAIR  2009   right with mesh.  Dr Marlou Starks  . THYROGLOSSAL DUCT CYST  2006   Dr. Wilburn Cornelia     reports that he has been smoking Cigarettes.  He has a 20.00 pack-year smoking history. He has never used smokeless tobacco. He reports that he drinks about 12.6 oz of alcohol per week . He reports that he does not use drugs.  Allergies  Allergen Reactions  . Cialis [Tadalafil] Other (See Comments)    UNKNOWN  . Levitra [Vardenafil] Other (See Comments)     UNKNOWN  . Viagra [Sildenafil Citrate] Other (See Comments)    UNKNOWN    [name]    Prior to Admission medications   Medication Sig Start Date End Date Taking? Authorizing Provider  atorvastatin (LIPITOR) 40 MG tablet TAKE 1 TABLET BY MOUTH EVERY DAY 04/09/15   Jerline Pain, MD  calcium carbonate (TUMS - DOSED IN MG ELEMENTAL CALCIUM) 500 MG chewable tablet Chew 1 tablet by mouth as needed for heartburn.     Historical Provider, MD  diltiazem (CARTIA XT) 120 MG 24 hr capsule Take 1 capsule (120 mg total) by mouth as needed (For elevated heart rate). 03/30/15   Jerline Pain, MD  lisinopril (PRINIVIL,ZESTRIL) 20 MG tablet Take 1 tablet (20 mg total) by mouth daily. 03/30/15   Jerline Pain, MD  metoprolol succinate (TOPROL-XL) 25 MG 24 hr tablet Take 25 mg by mouth daily.    Historical Provider, MD  rivaroxaban (XARELTO) 20 MG TABS tablet Take 1 tablet (20 mg total) by mouth daily with supper. 02/22/16   Jerline Pain, MD    Physical Exam:   Constitutional: NAD, calm, comfortable Vitals:   03/04/16 2326 03/04/16 2330 03/05/16 0030 03/05/16 0100  BP: 132/89 145/95 117/87 132/92  Pulse: 105 106 97 100  Resp: 24 25 16 25   Temp: 97.8 F (36.6 C)     TempSrc: Oral     SpO2: 93% 92% 90% (!) 89%   Eyes: PERRL, lids and conjunctivae normal ENMT: Mucous membranes are moist. Posterior pharynx clear of any exudate or lesions.Normal dentition.  Neck: normal, supple, no masses, no thyromegaly Respiratory: Faint crackles appreciated in lower lung fields. No wheezes appreciated. Normal respiratory effort. No accessory muscle use.  Cardiovascular: Irregular irregular, no murmurs / rubs / gallops. No extremity edema. 2+ pedal pulses. No carotid bruits.  Abdomen: no tenderness, no masses palpated. No hepatosplenomegaly. Bowel sounds positive.  Musculoskeletal: no clubbing / cyanosis. No joint deformity upper and lower extremities. Good ROM,  no contractures. Normal muscle tone.  Skin: no rashes, lesions, ulcers. No induration Neurologic: CN 2-12 grossly intact. Sensation intact, DTR normal. Strength 5/5 in all 4.  Psychiatric: Normal judgment and insight. Alert and oriented x 3. Normal mood.     Labs on Admission: I have personally reviewed following labs and imaging studies  CBC:  Recent Labs Lab 03/04/16 2336  WBC 8.7  NEUTROABS 5.3  HGB 15.7  HCT 44.6  MCV 93.7  PLT 99991111   Basic Metabolic Panel:  Recent Labs Lab 03/04/16 2336  NA 136  K 4.4  CL 108  CO2 22  GLUCOSE 104*  BUN 17  CREATININE 1.09  CALCIUM 9.0   GFR: Estimated Creatinine Clearance: 67.8 mL/min (by C-G formula based on SCr of 1.09 mg/dL). Liver Function Tests: No results for input(s): AST, ALT, ALKPHOS, BILITOT, PROT, ALBUMIN  in the last 168 hours. No results for input(s): LIPASE, AMYLASE in the last 168 hours. No results for input(s): AMMONIA in the last 168 hours. Coagulation Profile: No results for input(s): INR, PROTIME in the last 168 hours. Cardiac Enzymes:  Recent Labs Lab 03/04/16 2336  TROPONINI <0.03   BNP (last 3 results) No results for input(s): PROBNP in the last 8760 hours. HbA1C: No results for input(s): HGBA1C in the last 72 hours. CBG: No results for input(s): GLUCAP in the last 168 hours. Lipid Profile: No results for input(s): CHOL, HDL, LDLCALC, TRIG, CHOLHDL, LDLDIRECT in the last 72 hours. Thyroid Function Tests: No results for input(s): TSH, T4TOTAL, FREET4, T3FREE, THYROIDAB in the last 72 hours. Anemia Panel: No results for input(s): VITAMINB12, FOLATE, FERRITIN, TIBC, IRON, RETICCTPCT in the last 72 hours. Urine analysis:    Component Value Date/Time   COLORURINE YELLOW 09/05/2013 [place] 09/05/2013 1118   LABSPEC 1.010 09/05/2013 1118   PHURINE 6.5 09/05/2013 1118   GLUCOSEU NEGATIVE 09/05/2013 1118   GLUCOSEU NEGATIVE 12/19/2008 1147   HGBUR NEGATIVE 09/05/2013 1118    [place] 09/05/2013 1118   BILIRUBINUR neg 06/21/2012 0944   KETONESUR NEGATIVE 09/05/2013 1118   PROTEINUR NEGATIVE 09/05/2013 1118   UROBILINOGEN 0.2 09/05/2013 1118   NITRITE NEGATIVE 09/05/2013 1118   LEUKOCYTESUR NEGATIVE 09/05/2013 1118   Sepsis Labs: No results found for this or any previous visit (from the past 240 hour(s)).   Radiological Exams on Admission: Dg Chest 2 View  Result Date: 03/05/2016 CLINICAL DATA:  Initial evaluation for acute shortness of breath. EXAM: CHEST  2 VIEW COMPARISON:  Prior radiograph from 02/20/2016. FINDINGS: Cardiac and mediastinal silhouettes are stable in size and contour, and remain within normal limits. Aortic atherosclerosis noted. Lungs are hyperinflated with emphysematous changes present. No focal infiltrates identified. There is subtly increased vascular congestion with interstitial prominence as compared to previous exam, suggesting mild superimposed interstitial edema. No pleural effusion. No pneumothorax. No acute osseous abnormality.  Bones are diffusely osteopenic. IMPRESSION: 1. Emphysema. Diffuse increased vascular an interstitial prominence as compared to recent radiograph from 02/20/2016, suggesting superimposed mild pulmonary interstitial edema/congestion. 2. No other active cardiopulmonary disease identified. Electronically Signed   By: Jeannine Boga M.D.   On: 03/05/2016 00:16    EKG: Independently reviewed. Atrial fibrillation 113 bpm  Assessment/Plan CHF exacerbation: Acute. Patient presents with acute shortness of breath and elevated BNP. Chest x-ray showing signs of vascular congestion. Patient was given 40 mg Lasix IV while in the ED. Also question of possibility of rapid atrial fibrillation rate causing patient's acute symptoms of respiratory distress. - Admit to telemetry bed - Heart failure protocol initiated - Strict I and O's  - Reassess in a.m. for need of continued IV diuresis - Check echocardiogram  in a.m. - Continue metoprolol and lisinopril  - Consult cardiology in a.m.  Persistent atrial fibrillation on chronic anticoagulation/history of SVT: Patient presented with irregular heart rates in the 110's on admission, but noted to be higher en route with EMS. Chadsvasc = 5 which warrants anticoagulation. Patient also noted to have a history of SVT. - Continue metoprolol and anticoagulation of Xarelto - check magnesium - Trend troponins - Discussed with cardiology regarding any changes needed and medications  Hypoxia:Acute: Patient noted to have drops in O2 sat duration 7 to 80s with ambulation.  - Continuous pulse oximetry with nasal cannula oxygen and keep O2 sats greater than 92% - DuoNeb's prn SOB/Wheezing  Essential hypertension -  Continue medications as seen above  Hyperlipidemia - Continue atorvastatin   CAD  Alcohol abuse - Counseled patient on the need of cessation of alcohol use with current medications - CWIA protocols  Tobacco abuse - Counseled on the need for cessation of tobacco  - Nicotine patch offered   DVT prophylaxis: Continue Xarelto Code Status: full Family Communication: discussed plan of care with patient and family present at bedside Disposition Plan:  possible discharge home in 1-2 days Consults called: none Admission status:  observation  Norval Morton MD [facility] Pager [phone]  If 7PM-7AM, please contact night-coverage [email or web address] Password [email or web address]  03/05/2016, 2:02 AM

## 2016-03-05 NOTE — Progress Notes (Signed)
  Echocardiogram 2D Echocardiogram has been performed.  Darlina Sicilian M 03/05/2016, 12:07 PM

## 2016-03-05 NOTE — Progress Notes (Signed)
PROGRESS NOTE    [name]  [id number] DOB: 1950-09-03 DOA: 03/04/2016 PCP: Hoyt Koch, MD   Chief Complaint  Patient presents with  . Atrial Fibrillation     Brief Narrative:  HPI on 03/05/2016 by Dr. Fuller Plan [name] is a 65 y.o. male with medical history significant of HTN, HLD, CAD, PAF on anticoagulation; who presents after being woken out of his sleep with acute onset of shortness of breath last night around 10:30 PM. Associated symptoms included palpitations, cramps, and diaphoresis. Denies having any chest pain symptoms, nausea, vomiting, fever, chills, leg swelling, change in weight. Wife noted that his blood pressure was as high as 170/100 and heart rates were into the 160s. He did not try anything to relieve symptoms. Patient has a prescription for diltiazem to take as needed, but this was not done in the acute setting and his wife immediately called EMS. Patient has been in persistent atrial fibrillation for more than 10 days now. Followed by Dr. [name] of cardiology in the outpatient setting last seen on 12/8. At that time he was switched from Pradxa to Xarelto for poor compliance with taking medication twice daily. It was decided that they would consider possible cardioversion sometime during the first week in January. Patient reports that he's been taking his medications as prescribed. Patient's wife gives history of continued tobacco abuse and alcohol abuse of anywhere from 2-3+ drinks per night on average.  Assessment & Plan   Acute CHF exacerbation/Acute respiratory failure with hypoxia -Per wife, prior to admission, patient's oxygen saturations were in the 80s at home -Upon admission, BNP  -CXR showed signs of vascular congestion -Echocardiogram 11/24/2013: EF 55% -Continue to monitor daily weights, intake/output -Given IV lasix in the ED -Continue metoprolol, lisinopril -Cardiology consulted and appreciated -Echocardiogram pending  -Continue  oxygen to maintain sats >92%, continues nebs  Persistent atrial fibrillation -CHADSVASC 1 -On Xarelto -Was on pradaxa, however was only taking once daily.  -has history of SVT -Continue metoprolol -Plan for cardioversion in January 2018  Essential hypertension -Continue metoprolol, lisinopril  Hyperlipidemia -Continue atorvastatin   CAD -Denies chest pain -Cycle troponins  Alcohol abuse -Counseled patient on the need of cessation of alcohol use with current medications -CIWA protocol  Tobacco abuse -Counseled on the need for cessation of tobacco  -Nicotine patch offered   DVT Prophylaxis  Xarelto  Code Status: Full  Family Communication: Wife at bedside  Disposition Plan: Currently in observation.  Consultants Cardiology  Procedures  Echocardiogram  Antibiotics   Anti-infectives    None      Subjective:   [name] seen and examined today.  Patient states he is trying to get over a cold.  Denies current chest pain.  Did have some shortness of breathing prior to admission.  Has had occasional nonproductive cough. Denies abdominal pain, N/V/D/C, dizziness, headache.    Objective:   Vitals:   03/05/16 0200 03/05/16 0230 03/05/16 0300 03/05/16 0341  BP: 121/95 135/86 135/92 137/64  Pulse: 90 (!) 47 95 98  Resp: 21 16 17 18   Temp:    98.6 F (37 C)  TempSrc:    Oral  SpO2: 93% 91% 91% 98%  Weight:    64.4 kg (142 lb)  Height:    6\' 2"  (1.88 m)    Intake/Output Summary (Last 24 hours) at 03/05/16 1346 Last data filed at 03/05/16 0856  Gross per 24 hour  Intake  160 ml  Output                0 ml  Net              160 ml   Filed Weights   03/05/16 0341  Weight: 64.4 kg (142 lb)    Exam  General: Well developed, well nourished, NAD, appears stated age  78: NCAT,  mucous membranes moist.   Neck: Supple, no JVD, no masses  Cardiovascular: S1 S2 auscultated, IRR  Respiratory: Faint crackles, occ cough  Abdomen:  Soft, nontender, nondistended, + bowel sounds  Extremities: warm dry without cyanosis clubbing or edema  Neuro: AAOx3, nonfocal  Psych: Normal affect and demeanor with intact judgement and insight   Data Reviewed: I have personally reviewed following labs and imaging studies  CBC:  Recent Labs Lab 03/04/16 2336  WBC 8.7  NEUTROABS 5.3  HGB 15.7  HCT 44.6  MCV 93.7  PLT 99991111   Basic Metabolic Panel:  Recent Labs Lab 03/04/16 2336 03/05/16 0403  NA 136  --   K 4.4  --   CL 108  --   CO2 22  --   GLUCOSE 104*  --   BUN 17  --   CREATININE 1.09  --   CALCIUM 9.0  --   MG  --  1.9   GFR: Estimated Creatinine Clearance: 62.4 mL/min (by C-G formula based on SCr of 1.09 mg/dL). Liver Function Tests: No results for input(s): AST, ALT, ALKPHOS, BILITOT, PROT, ALBUMIN in the last 168 hours. No results for input(s): LIPASE, AMYLASE in the last 168 hours. No results for input(s): AMMONIA in the last 168 hours. Coagulation Profile: No results for input(s): INR, PROTIME in the last 168 hours. Cardiac Enzymes:  Recent Labs Lab 03/04/16 2336 03/05/16 0403 03/05/16 1013  TROPONINI <0.03 <0.03 0.03*   BNP (last 3 results) No results for input(s): PROBNP in the last 8760 hours. HbA1C: No results for input(s): HGBA1C in the last 72 hours. CBG: No results for input(s): GLUCAP in the last 168 hours. Lipid Profile: No results for input(s): CHOL, HDL, LDLCALC, TRIG, CHOLHDL, LDLDIRECT in the last 72 hours. Thyroid Function Tests: No results for input(s): TSH, T4TOTAL, FREET4, T3FREE, THYROIDAB in the last 72 hours. Anemia Panel: No results for input(s): VITAMINB12, FOLATE, FERRITIN, TIBC, IRON, RETICCTPCT in the last 72 hours. Urine analysis:    Component Value Date/Time   COLORURINE YELLOW 09/05/2013 [place] 09/05/2013 1118   LABSPEC 1.010 09/05/2013 1118   PHURINE 6.5 09/05/2013 1118   GLUCOSEU NEGATIVE 09/05/2013 1118   GLUCOSEU NEGATIVE  12/19/2008 1147   HGBUR NEGATIVE 09/05/2013 1118   [place] 09/05/2013 1118   BILIRUBINUR neg 06/21/2012 0944   KETONESUR NEGATIVE 09/05/2013 1118   PROTEINUR NEGATIVE 09/05/2013 1118   UROBILINOGEN 0.2 09/05/2013 1118   NITRITE NEGATIVE 09/05/2013 1118   LEUKOCYTESUR NEGATIVE 09/05/2013 1118   Sepsis Labs: @LABRCNTIP (procalcitonin:4,lacticidven:4)  )No results found for this or any previous visit (from the past 240 hour(s)).    Radiology Studies: Dg Chest 2 View  Result Date: 03/05/2016 CLINICAL DATA:  Initial evaluation for acute shortness of breath. EXAM: CHEST  2 VIEW COMPARISON:  Prior radiograph from 02/20/2016. FINDINGS: Cardiac and mediastinal silhouettes are stable in size and contour, and remain within normal limits. Aortic atherosclerosis noted. Lungs are hyperinflated with emphysematous changes present. No focal infiltrates identified. There is subtly increased vascular congestion with interstitial prominence as compared to previous exam, suggesting mild  superimposed interstitial edema. No pleural effusion. No pneumothorax. No acute osseous abnormality.  Bones are diffusely osteopenic. IMPRESSION: 1. Emphysema. Diffuse increased vascular an interstitial prominence as compared to recent radiograph from 02/20/2016, suggesting superimposed mild pulmonary interstitial edema/congestion. 2. No other active cardiopulmonary disease identified. Electronically Signed   By: Jeannine Boga M.D.   On: 03/05/2016 00:16     Scheduled Meds: . atorvastatin  40 mg Oral Daily  . folic acid  1 mg Oral Daily  . lisinopril  20 mg Oral Daily  . metoprolol succinate  25 mg Oral Daily  . multivitamin with minerals  1 tablet Oral Daily  . pantoprazole  40 mg Oral Daily  . rivaroxaban  20 mg Oral Q supper  . sodium chloride flush  3 mL Intravenous Q12H  . thiamine  100 mg Oral Daily   Or  . thiamine  100 mg Intravenous Daily   Continuous Infusions:   LOS: 0 days   Time  Spent in minutes   30 minutes  Zavon Hyson D.O. on 03/05/2016 at 1:46 PM  Between 7am to 7pm - Pager - [phone]  After 7pm go to [email or web address] - password [email or web address]  And look for the night coverage person covering for me after hours  [facility] Office  [phone]

## 2016-03-05 NOTE — Consult Note (Signed)
Reason for Consult: a fib   Referring Physician: Dr. Ree Kida  PCP:  Hoyt Koch, MD  Primary Cardiologist: Dr. Cathie Hoops [name] is an 65 y.o. male.    Chief Complaint:  Admitted with a fib for 10 days.  Associated with SOB and diaphoresis.  The SOB was worse yesterday so came to ER.  HPI:  76 yom with hx of a fib/flutter with flutter ablation 10/18/10.  Had been having palpitations 1-2 times a week.  He was in a flutter on EKGs on 12/6 and 12/11.  And Dr. [name] noted a flutter.   He was having SOB and nausea along with diaphoresis then seen by urgent care and treated for COPD.    Plan was to set up for DCCV after 3 weeks of xarelto. CHADS-VASc (HTN, TIA) - 3.  Prior question history of antiphospholipid antibody syndrome but this is not proven according to the patient. Plan was if DCCV did not hold then back to Dr. Rayann Heman for antiarrythmic.   Now with increased SOB and a flutter with HR to 150 at times.   Catheterization July 06, 2010, chronic total occlusion distal RCA with collaterals from right and left side. Medical therapy recommended, mild decreased LV function.  Nuclear stress test stable on 11/25/13. old inferior scar noted.   He continues to smoke.   [name], other times 120s.  He is on toprol 25 daily.  He has not used his PRN cartia.  No chest pain.   His SOB just becomes bad.      Past Medical History:  Diagnosis Date  . Adenomatous colon polyp   . Alcohol use   . Antiphospholipid antibody positive    ???In the past???but not proven according to patient  . Antiphospholipid antibody positive    ??? in the past, but not proven according to the patient.  . Atrial flutter ([place])    atrial flutter ablation,   Dr.Allred October 18, 2010  . BPH (benign prostatic hypertrophy)   . CAD (coronary artery disease)    Catheterization 2005 disease / catheterization July 06, 2010, chronic total occlusion distal RCA with collaterals from  right and left side.  Medical therapy recommended, mild decreased LV function       . Carotid artery disease (HCC)    9-29% R. ICA, 24-46% LICA... Doppler.... January, 2010  /  Doppler... in January, 2011 no change  /  doppler1/27/2012...stable  2-86% RICA 38-17% LICA  . CRAO (central retinal artery occlusion)   . Ejection fraction     EF 50-55%, echo, March, 2012... inferobasal and distal septal hypokinesis /      60%, echo, November, 2008  . GERD (gastroesophageal reflux disease)   . Hypercholesterolemia   . Hypertension   . Myocardial infarction   . Nonsustained ventricular tachycardia ([place])   . Palpitations    probable SVT, rate 170,, at home,, lasted one hour,, 2012  . Raynaud's syndrome   . TIA (transient ischemic attack)    Possible small vessel tia in the past.  . Tobacco abuse     Past Surgical History:  Procedure Laterality Date  . CARDIAC CATHETERIZATION  06/27/2003   2005.... mild irregularity of the LAD..( patient had had abnormal Myoview scan)  . INGUINAL HERNIA REPAIR  2009   right with mesh.  Dr Marlou Starks  . THYROGLOSSAL DUCT CYST  2006   Dr. Wilburn Cornelia  [name]   Social History:  reports that he has been smoking Cigarettes.  He has a 20.00 pack-year smoking history. He has never used smokeless tobacco. He reports that he drinks about 12.6 oz of alcohol per week . He reports that he does not use drugs.  Allergies:  Allergies  Allergen Reactions  . Cialis [Tadalafil] Other (See Comments)    UNKNOWN  . Levitra [Vardenafil] Other (See Comments)    UNKNOWN  . Viagra [Sildenafil Citrate] Other (See Comments)    UNKNOWN    OUTPATIENT MEDICATIONS: No current facility-administered medications on file prior to encounter.    Current Outpatient Prescriptions on File Prior to Encounter  Medication Sig Dispense Refill  . atorvastatin (LIPITOR) 40 MG tablet TAKE 1 TABLET BY MOUTH EVERY DAY 30 tablet 12  . calcium  carbonate (TUMS - DOSED IN MG ELEMENTAL CALCIUM) 500 MG chewable tablet Chew 1 tablet by mouth as needed for heartburn.     . diltiazem (CARTIA XT) 120 MG 24 hr capsule Take 1 capsule (120 mg total) by mouth as needed (For elevated heart rate). 30 capsule 6  . lisinopril (PRINIVIL,ZESTRIL) 20 MG tablet Take 1 tablet (20 mg total) by mouth daily. 30 tablet 11  . metoprolol succinate (TOPROL-XL) 25 MG 24 hr tablet Take 25 mg by mouth daily.    . rivaroxaban (XARELTO) 20 MG TABS tablet Take 1 tablet (20 mg total) by mouth daily with supper. 30 tablet 6    CURRENT MEDICATIONS: Scheduled Meds: . atorvastatin  40 mg Oral Daily  . folic acid  1 mg Oral Daily  . lisinopril  20 mg Oral Daily  . metoprolol succinate  25 mg Oral Daily  . multivitamin with minerals  1 tablet Oral Daily  . pantoprazole  40 mg Oral Daily  . rivaroxaban  20 mg Oral Q supper  . sodium chloride flush  3 mL Intravenous Q12H  . thiamine  100 mg Oral Daily   Or  . thiamine  100 mg Intravenous Daily   Continuous Infusions: PRN Meds:.sodium chloride, acetaminophen, calcium carbonate, ipratropium-albuterol, LORazepam **OR** LORazepam, nicotine, ondansetron (ZOFRAN) IV, sodium chloride flush   Results for orders placed or performed during the hospital encounter of 03/04/16 (from the past 48 hour(s))  Basic metabolic panel     Status: Abnormal   Collection Time: 03/04/16 11:36 PM  Result Value Ref Range   Sodium 136 135 - 145 mmol/L   Potassium 4.4 3.5 - 5.1 mmol/L   Chloride 108 101 - 111 mmol/L   CO2 22 22 - 32 mmol/L   Glucose, Bld 104 (H) 65 - 99 mg/dL   BUN 17 6 - 20 mg/dL   Creatinine, Ser 1.09 0.61 - 1.24 mg/dL   Calcium 9.0 8.9 - 10.3 mg/dL   GFR calc non Af Amer >60 >60 mL/min   GFR calc Af Amer >60 >60 mL/min    Comment: (NOTE) The eGFR has been calculated using the CKD EPI equation. This calculation has not been validated in all clinical situations. eGFR's persistently <60 mL/min signify possible Chronic  Kidney Disease.    Anion gap 6 5 - 15  CBC with Differential     Status: None   Collection Time: 03/04/16 11:36 PM  Result Value Ref Range   WBC 8.7 4.0 - 10.5 K/uL   RBC 4.76 4.22 - 5.81 MIL/uL   Hemoglobin 15.7 13.0 - 17.0 g/dL   HCT 44.6 39.0 -  52.0 %   MCV 93.7 78.0 - 100.0 fL   MCH 33.0 26.0 - 34.0 pg   MCHC 35.2 30.0 - 36.0 g/dL   RDW 13.1 11.5 - 15.5 %   Platelets 191 150 - 400 K/uL   Neutrophils Relative % 60 %   Neutro Abs 5.3 1.7 - 7.7 K/uL   Lymphocytes Relative 30 %   Lymphs Abs 2.6 0.7 - 4.0 K/uL   Monocytes Relative 7 %   Monocytes Absolute 0.6 0.1 - 1.0 K/uL   Eosinophils Relative 2 %   Eosinophils Absolute 0.2 0.0 - 0.7 K/uL   Basophils Relative 1 %   Basophils Absolute 0.1 0.0 - 0.1 K/uL  Troponin I     Status: None   Collection Time: 03/04/16 11:36 PM  Result Value Ref Range   Troponin I <0.03 <0.03 ng/mL  Brain natriuretic peptide     Status: Abnormal   Collection Time: 03/04/16 11:36 PM  Result Value Ref Range   B Natriuretic Peptide 322.6 (H) 0.0 - 100.0 pg/mL  Magnesium     Status: None   Collection Time: 03/05/16  4:03 AM  Result Value Ref Range   Magnesium 1.9 1.7 - 2.4 mg/dL  Troponin I     Status: None   Collection Time: 03/05/16  4:03 AM  Result Value Ref Range   Troponin I <0.03 <0.03 ng/mL  Troponin I     Status: Abnormal   Collection Time: 03/05/16 10:13 AM  Result Value Ref Range   Troponin I 0.03 (HH) <0.03 ng/mL    Comment: CRITICAL RESULT CALLED TO, READ BACK BY AND VERIFIED WITH: HUBER,K RN @ [phone] 03/05/16 LEONARD,A    Dg Chest 2 View  Result Date: 03/05/2016 CLINICAL DATA:  Initial evaluation for acute shortness of breath. EXAM: CHEST  2 VIEW COMPARISON:  Prior radiograph from 02/20/2016. FINDINGS: Cardiac and mediastinal silhouettes are stable in size and contour, and remain within normal limits. Aortic atherosclerosis noted. Lungs are hyperinflated with emphysematous changes present. No focal infiltrates identified. There is  subtly increased vascular congestion with interstitial prominence as compared to previous exam, suggesting mild superimposed interstitial edema. No pleural effusion. No pneumothorax. No acute osseous abnormality.  Bones are diffusely osteopenic. IMPRESSION: 1. Emphysema. Diffuse increased vascular an interstitial prominence as compared to recent radiograph from 02/20/2016, suggesting superimposed mild pulmonary interstitial edema/congestion. 2. No other active cardiopulmonary disease identified. Electronically Signed   By: Jeannine Boga M.D.   On: 03/05/2016 00:16    ROS: General:+ recent cold no fevers,+ weight changes but now in gown Skin:no rashes or ulcers HEENT:no blurred vision, no congestion CV:see HPI PUL:see HPI GI:no diarrhea constipation or melena, no indigestion GU:no hematuria, no dysuria MS:no joint pain, no claudication Neuro:no syncope, no lightheadedness Endo:no diabetes, no thyroid disease   Blood pressure 137/64, pulse 98, temperature 98.6 F (37 C), temperature source Oral, resp. rate 18, height 6' 2"  (1.88 m), weight 142 lb (64.4 kg), SpO2 98 %.  Wt Readings from Last 3 Encounters:  03/05/16 142 lb (64.4 kg)  02/22/16 154 lb 6.4 oz (70 kg)  02/20/16 153 lb (69.4 kg)    PE: General:Pleasant affect, NAD Skin:Warm and dry, brisk capillary refill HEENT:normocephalic, sclera clear, mucus membranes moist Neck:supple, no JVD, no bruits  Heart:ireg irreg without murmur, gallup, rub or click Lungs:diminished  without rales, rhonchi, or wheezes IRC:VELF, non tender, + BS, do not palpate liver spleen or masses Ext:no lower ext edema, 2+ pedal pulses, 2+ radial pulses Neuro:alert and  oriented, MAE, follows commands, + facial symmetry    Assessment/Plan A flutter with RVR will add dilt po and decrease ACE to give more BP.  Continue BB.  Continue Xarelto.  Plan will be for TEE DCCV tomorrow if at all possible.     COPD per IM  HLD per IM.  Cecilie Kicks  Nurse  Practitioner Certified [place] Pager [phone] or after 5pm or weekends call [phone] 03/05/2016, 2:06 PM    Patient examined chart reviewed Thin white male with COPD no active wheezing No murmur on exam no fluid  Overload. Telemetry with rapid aflutter rates 120-148.  Admitted with signs of CHF EF previously normal. Has Been on xarelto since 12/8.  Post flutter ablation with atypical flutter now.  Discussed options with patient and wife Burtis Junes TEE/DCC in am risks discussed willing to proceed 12:00 tomorrow with me  [name]

## 2016-03-06 ENCOUNTER — Encounter (HOSPITAL_COMMUNITY): Payer: Self-pay | Admitting: *Deleted

## 2016-03-06 ENCOUNTER — Observation Stay (HOSPITAL_COMMUNITY): Payer: 59 | Admitting: Certified Registered Nurse Anesthetist

## 2016-03-06 ENCOUNTER — Encounter (HOSPITAL_COMMUNITY)
Admission: EM | Disposition: A | Discharge status: Home / Self Care | Payer: Self-pay | Source: Home / Self Care | Attending: Internal Medicine

## 2016-03-06 ENCOUNTER — Observation Stay (HOSPITAL_COMMUNITY): Payer: 59

## 2016-03-06 DIAGNOSIS — I5023 Acute on chronic systolic (congestive) heart failure: Secondary | ICD-10-CM | POA: Diagnosis present

## 2016-03-06 DIAGNOSIS — K219 Gastro-esophageal reflux disease without esophagitis: Secondary | ICD-10-CM | POA: Diagnosis present

## 2016-03-06 DIAGNOSIS — F1010 Alcohol abuse, uncomplicated: Secondary | ICD-10-CM

## 2016-03-06 DIAGNOSIS — N179 Acute kidney failure, unspecified: Secondary | ICD-10-CM | POA: Diagnosis present

## 2016-03-06 DIAGNOSIS — I2510 Atherosclerotic heart disease of native coronary artery without angina pectoris: Secondary | ICD-10-CM | POA: Diagnosis present

## 2016-03-06 DIAGNOSIS — E785 Hyperlipidemia, unspecified: Secondary | ICD-10-CM

## 2016-03-06 DIAGNOSIS — Z7901 Long term (current) use of anticoagulants: Secondary | ICD-10-CM | POA: Diagnosis not present

## 2016-03-06 DIAGNOSIS — F17200 Nicotine dependence, unspecified, uncomplicated: Secondary | ICD-10-CM

## 2016-03-06 DIAGNOSIS — I110 Hypertensive heart disease with heart failure: Secondary | ICD-10-CM | POA: Diagnosis present

## 2016-03-06 DIAGNOSIS — I340 Nonrheumatic mitral (valve) insufficiency: Secondary | ICD-10-CM | POA: Diagnosis not present

## 2016-03-06 DIAGNOSIS — Z888 Allergy status to other drugs, medicaments and biological substances status: Secondary | ICD-10-CM | POA: Diagnosis not present

## 2016-03-06 DIAGNOSIS — F17210 Nicotine dependence, cigarettes, uncomplicated: Secondary | ICD-10-CM | POA: Diagnosis present

## 2016-03-06 DIAGNOSIS — I10 Essential (primary) hypertension: Secondary | ICD-10-CM

## 2016-03-06 DIAGNOSIS — I509 Heart failure, unspecified: Secondary | ICD-10-CM | POA: Diagnosis not present

## 2016-03-06 DIAGNOSIS — E7800 Pure hypercholesterolemia, unspecified: Secondary | ICD-10-CM | POA: Diagnosis present

## 2016-03-06 DIAGNOSIS — I481 Persistent atrial fibrillation: Secondary | ICD-10-CM | POA: Diagnosis present

## 2016-03-06 DIAGNOSIS — J439 Emphysema, unspecified: Secondary | ICD-10-CM | POA: Diagnosis present

## 2016-03-06 DIAGNOSIS — J9601 Acute respiratory failure with hypoxia: Secondary | ICD-10-CM | POA: Diagnosis present

## 2016-03-06 DIAGNOSIS — I4892 Unspecified atrial flutter: Secondary | ICD-10-CM | POA: Diagnosis present

## 2016-03-06 DIAGNOSIS — I4891 Unspecified atrial fibrillation: Secondary | ICD-10-CM

## 2016-03-06 DIAGNOSIS — I480 Paroxysmal atrial fibrillation: Secondary | ICD-10-CM

## 2016-03-06 DIAGNOSIS — N400 Enlarged prostate without lower urinary tract symptoms: Secondary | ICD-10-CM | POA: Diagnosis present

## 2016-03-06 DIAGNOSIS — I7300 Raynaud's syndrome without gangrene: Secondary | ICD-10-CM | POA: Diagnosis present

## 2016-03-06 DIAGNOSIS — H3410 Central retinal artery occlusion, unspecified eye: Secondary | ICD-10-CM | POA: Diagnosis present

## 2016-03-06 DIAGNOSIS — I5021 Acute systolic (congestive) heart failure: Secondary | ICD-10-CM

## 2016-03-06 DIAGNOSIS — Z8673 Personal history of transient ischemic attack (TIA), and cerebral infarction without residual deficits: Secondary | ICD-10-CM | POA: Diagnosis not present

## 2016-03-06 DIAGNOSIS — I252 Old myocardial infarction: Secondary | ICD-10-CM | POA: Diagnosis not present

## 2016-03-06 HISTORY — PX: CARDIOVERSION: SHX1299

## 2016-03-06 HISTORY — PX: TEE WITHOUT CARDIOVERSION: SHX5443

## 2016-03-06 LAB — BASIC METABOLIC PANEL
ANION GAP: 9 (ref 5–15)
BUN: 16 mg/dL (ref 6–20)
CALCIUM: 9.4 mg/dL (ref 8.9–10.3)
CO2: 29 mmol/L (ref 22–32)
Chloride: 100 mmol/L — ABNORMAL LOW (ref 101–111)
Creatinine, Ser: 1.33 mg/dL — ABNORMAL HIGH (ref 0.61–1.24)
GFR calc non Af Amer: 55 mL/min — ABNORMAL LOW (ref >60)
Glucose, Bld: 95 mg/dL (ref 65–99)
Potassium: 4.2 mmol/L (ref 3.5–5.1)
SODIUM: 138 mmol/L (ref 135–145)

## 2016-03-06 LAB — CBC
HCT: 47.8 % (ref 39.0–52.0)
HEMOGLOBIN: 16.6 g/dL (ref 13.0–17.0)
MCH: 33 pg (ref 26.0–34.0)
MCHC: 34.7 g/dL (ref 30.0–36.0)
MCV: 95 fL (ref 78.0–100.0)
PLATELETS: 188 10*3/uL (ref 150–400)
RBC: 5.03 MIL/uL (ref 4.22–5.81)
RDW: 13.4 % (ref 11.5–15.5)
WBC: 8.9 10*3/uL (ref 4.0–10.5)

## 2016-03-06 LAB — MAGNESIUM: Magnesium: 1.7 mg/dL (ref 1.7–2.4)

## 2016-03-06 LAB — PROTIME-INR
INR: 1.69
PROTHROMBIN TIME: 20.1 s — AB (ref 11.4–15.2)

## 2016-03-06 SURGERY — ECHOCARDIOGRAM, TRANSESOPHAGEAL
Anesthesia: General

## 2016-03-06 MED ORDER — AMIODARONE HCL IN DEXTROSE 360-4.14 MG/200ML-% IV SOLN
30.0000 mg/h | INTRAVENOUS | Status: DC
  Administered 2016-03-06: 30 mg/h via INTRAVENOUS
  Filled 2016-03-06 (×3): qty 200

## 2016-03-06 MED ORDER — AMIODARONE HCL IN DEXTROSE 360-4.14 MG/200ML-% IV SOLN
60.0000 mg/h | INTRAVENOUS | Status: DC
  Administered 2016-03-06 (×2): 60 mg/h via INTRAVENOUS

## 2016-03-06 MED ORDER — BUTAMBEN-TETRACAINE-BENZOCAINE 2-2-14 % EX AERO
INHALATION_SPRAY | CUTANEOUS | Status: DC | PRN
  Administered 2016-03-06: 2 via TOPICAL

## 2016-03-06 MED ORDER — PHENYLEPHRINE HCL 10 MG/ML IJ SOLN
INTRAMUSCULAR | Status: DC | PRN
  Administered 2016-03-06 (×2): 40 ug via INTRAVENOUS

## 2016-03-06 MED ORDER — PROPOFOL 10 MG/ML IV BOLUS
INTRAVENOUS | Status: DC | PRN
  Administered 2016-03-06 (×5): 20 mg via INTRAVENOUS

## 2016-03-06 MED ORDER — PROPOFOL 500 MG/50ML IV EMUL
INTRAVENOUS | Status: DC | PRN
  Administered 2016-03-06: 125 ug/kg/min via INTRAVENOUS

## 2016-03-06 MED ORDER — AMIODARONE LOAD VIA INFUSION
150.0000 mg | Freq: Once | INTRAVENOUS | Status: AC
  Administered 2016-03-06: 150 mg via INTRAVENOUS
  Filled 2016-03-06: qty 83.34

## 2016-03-06 MED ORDER — AMIODARONE LOAD VIA INFUSION
150.0000 mg | Freq: Once | INTRAVENOUS | Status: AC
  Administered 2016-03-06: 150 mg via INTRAVENOUS

## 2016-03-06 NOTE — CV Procedure (Signed)
TEE/DCC  Mild MR EF 25-30% No LAA thrombus AV sclerosis  Moderate aortic debris  DCC x 3 converted with 150/200 J but then went back into flutter DCC again 200J and maint NSR with PAC;s  No immediate neurologic sequelae Start amiodarone drip.

## 2016-03-06 NOTE — Transfer of Care (Signed)
Immediate Anesthesia Transfer of Care Note  Patient: [name]  Procedure(s) Performed: Procedure(s): TRANSESOPHAGEAL ECHOCARDIOGRAM (TEE) (N/A) CARDIOVERSION (N/A)  Patient Location: Endoscopy Unit  Anesthesia Type:MAC  Level of Consciousness: awake, alert  and oriented  Airway & Oxygen Therapy: Patient Spontanous Breathing and Patient connected to nasal cannula oxygen  Post-op Assessment: Report given to RN and Post -op Vital signs reviewed and stable  Post vital signs: Reviewed and stable  Last Vitals:  Vitals:   03/06/16 0649 03/06/16 1121  BP: 121/85 112/62  Pulse: (!) 116 (!) 121  Resp:  (!) 27  Temp:  36.5 C    Last Pain:  Vitals:   03/06/16 1121  TempSrc: Oral  PainSc:       Patients Stated Pain Goal: 0 ([id number])  Complications: No apparent anesthesia complications

## 2016-03-06 NOTE — Progress Notes (Signed)
  Amiodarone Drug - Drug Interaction Consult Note  Recommendations:   Spoke with Dr. Johnsie Cancel.   Diltiazem discontinued.    Amiodarone is metabolized by the cytochrome P450 system and therefore has the potential to cause many drug interactions. Amiodarone has an average plasma half-life of 50 days (range 20 to 100 days).   There is potential for drug interactions to occur several weeks or months after stopping treatment and the onset of drug interactions may be slow after initiating amiodarone.   [x]  Statins: Increased risk of myopathy. Simvastatin- restrict dose to 20mg  daily. Other statins: counsel patients to report any muscle pain or weakness immediately.  On Atorvastatin 40 mg daily.  []  Anticoagulants: Amiodarone can increase anticoagulant effect. Consider warfarin dose reduction. Patients should be monitored closely and the dose of anticoagulant altered accordingly, remembering that amiodarone levels take several weeks to stabilize.  []  Antiepileptics: Amiodarone can increase plasma concentration of phenytoin, the dose should be reduced. Note that small changes in phenytoin dose can result in large changes in levels. Monitor patient and counsel on signs of toxicity.  []  Beta blockers: increased risk of bradycardia, AV block and myocardial depression. Sotalol - avoid concomitant use.  [x]   Calcium channel blockers (diltiazem and verapamil): increased risk of bradycardia, AV block and myocardial depression.  On Diltazem 30 mg PO q6hrs.   []   Cyclosporine: Amiodarone increases levels of cyclosporine. Reduced dose of cyclosporine is recommended.  []  Digoxin dose should be halved when amiodarone is started.  []  Diuretics: increased risk of cardiotoxicity if hypokalemia occurs.  []  Oral hypoglycemic agents (glyburide, glipizide, glimepiride): increased risk of hypoglycemia. Patient's glucose levels should be monitored closely when initiating amiodarone therapy.   []  Drugs that prolong the  QT interval:  Torsades de pointes risk may be increased with concurrent use - avoid if possible.  Monitor QTc, also keep magnesium/potassium WNL if concurrent therapy can't be avoided. Marland Kitchen Antibiotics: e.g. fluoroquinolones, erythromycin. . Antiarrhythmics: e.g. quinidine, procainamide, disopyramide, sotalol. . Antipsychotics: e.g. phenothiazines, haloperidol.  . Lithium, tricyclic antidepressants, and methadone.  Thank You,   Arty Baumgartner , [place] Pager: [phone] 03/06/2016 3:45 PM

## 2016-03-06 NOTE — Progress Notes (Signed)
Patient Name: [name] Date of Encounter: 03/06/2016  Primary Cardiologist: Dr [facility] Problem List     Principal Problem:   Acute CHF [facility]) Active Problems:   Hyperlipidemia   Alcohol abuse   TOBACCO ABUSE   Essential hypertension   CAD (coronary artery disease)   Paroxysmal atrial fibrillation (HCC)     Subjective   No chest pain, SOB is better, active w/ work. 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 Never gets chest pain.  Inpatient Medications    Scheduled Meds: . atorvastatin  40 mg Oral Daily  . diltiazem  30 mg Oral [id number]  . folic acid  1 mg Oral Daily  . lisinopril  10 mg Oral Daily  . metoprolol succinate  25 mg Oral Daily  . multivitamin with minerals  1 tablet Oral Daily  . pantoprazole  40 mg Oral Daily  . rivaroxaban  20 mg Oral Q supper  . sodium chloride flush  3 mL Intravenous Q12H  . sodium chloride flush  3 mL Intravenous Q12H  . thiamine  100 mg Oral Daily   Or  . thiamine  100 mg Intravenous Daily   Continuous Infusions: . sodium chloride    . sodium chloride     PRN Meds: sodium chloride, acetaminophen, calcium carbonate, ipratropium-albuterol, LORazepam **OR** LORazepam, nicotine, ondansetron (ZOFRAN) IV, sodium chloride flush, sodium chloride flush   Vital Signs    Vitals:   03/05/16 2100 03/06/16 0019 03/06/16 0414 03/06/16 0649  BP: (!) 86/53 103/80 (!) 96/56 121/85  Pulse:  (!) 108 (!) 107 (!) 116  Resp:   16   Temp:   98.5 F (36.9 C)   TempSrc:   Oral   SpO2:   98%   Weight:   138 lb 14.4 oz (63 kg)   Height:        Intake/Output Summary (Last 24 hours) at 03/06/16 0923 Last data filed at 03/06/16 0030  Gross per 24 hour  Intake              620 ml  Output              225 ml  Net              395 ml   Filed Weights   03/05/16 0341 03/06/16 0414  Weight: 142 lb (64.4 kg) 138 lb 14.4 oz (63 kg)    Physical Exam    GEN: Well nourished, well  developed, in no acute distress.  HEENT: Grossly normal.  Neck: Supple, no JVD, carotid bruits, or masses. Cardiac: Irreg R&R, no murmurs, rubs, or gallops. No clubbing, cyanosis, edema.  Radials/DP/PT 2+ and equal bilaterally.  Respiratory:  Respirations regular and unlabored, few scattered rales bilaterally. GI: Soft, nontender, nondistended, BS + x 4. MS: no deformity or atrophy. Skin: warm and dry, no rash. Neuro:  Strength and sensation are intact. Psych: AAOx3.  Normal affect.  Labs    CBC  Recent Labs  03/04/16 2336 03/06/16 0359  WBC 8.7 8.9  NEUTROABS 5.3  --   HGB 15.7 16.6  HCT 44.6 47.8  MCV 93.7 95.0  PLT 191 0000000   Basic Metabolic Panel  Recent Labs  03/04/16 2336 03/05/16 0403 03/06/16 0359  NA 136  --  138  K 4.4  --  4.2  CL 108  --  100*  CO2 22  --  29  GLUCOSE  104*  --  95  BUN 17  --  16  CREATININE 1.09  --  1.33*  CALCIUM 9.0  --  9.4  MG  --  1.9 1.7   Liver Function Tests Lab Results  Component Value Date   ALT 71 (H) 02/22/2016   AST 47 (H) 02/22/2016   ALKPHOS 83 02/22/2016   BILITOT 0.9 02/22/2016   Cardiac Enzymes  Recent Labs  03/04/16 2336 03/05/16 0403 03/05/16 1013  TROPONINI <0.03 <0.03 0.03*    Telemetry    Atrial flutter, frequently RVR - Personally Reviewed  ECG    12/21 Atypical atrial flutter - Personally Reviewed  Radiology    Dg Chest 2 View  Result Date: 03/05/2016 CLINICAL DATA:  Initial evaluation for acute shortness of breath. EXAM: CHEST  2 VIEW COMPARISON:  Prior radiograph from 02/20/2016. FINDINGS: Cardiac and mediastinal silhouettes are stable in size and contour, and remain within normal limits. Aortic atherosclerosis noted. Lungs are hyperinflated with emphysematous changes present. No focal infiltrates identified. There is subtly increased vascular congestion with interstitial prominence as compared to previous exam, suggesting mild superimposed interstitial edema. No pleural effusion. No  pneumothorax. No acute osseous abnormality.  Bones are diffusely osteopenic. IMPRESSION: 1. Emphysema. Diffuse increased vascular an interstitial prominence as compared to recent radiograph from 02/20/2016, suggesting superimposed mild pulmonary interstitial edema/congestion. 2. No other active cardiopulmonary disease identified. Electronically Signed   By: Jeannine Boga M.D.   On: 03/05/2016 00:16    Cardiac Studies   ECHO: 12/20 - Left ventricle: The cavity size was normal. Systolic function was   severely reduced. The estimated ejection fraction was in the   range of 20% to 25%. Diffuse hypokinesis. The study was not   technically sufficient to allow evaluation of LV diastolic   dysfunction due to atrial fibrillation. - Ventricular septum: Septal motion showed paradox. - Aortic valve: Trileaflet; normal thickness leaflets. There was no   regurgitation. - Aortic root: The aortic root was normal in size. - Mitral valve: Structurally normal valve. There was mild   regurgitation. - Left atrium: The atrium was normal in size. - Right ventricle: The cavity size was normal. Wall thickness was   normal. Systolic function was normal. - Right atrium: The atrium was normal in size. - Tricuspid valve: There was mild regurgitation. - Pulmonic valve: There was no regurgitation. - Pulmonary arteries: Systolic pressure was within the normal   range. - Inferior vena cava: The vessel was normal in size. - Pericardium, extracardiac: There was no pericardial effusion. Impressions: - When compared to the prior study from 11/24/2013 LVEF has   decreased from 50-55% to 20-25%. There is diffuse hypokinesis and   paradoxical septal motion. LVEF is most probably underestimated   sec to atrial flutter with RVR during the acquisition.   Patient Profile     65 yo male w/ aflutter ablation 2012, recurred 12/06, started on Xarelto, for DCCV in 3 weeks. Admitte 12/20 w/ increased SOB, EF now 20-25%.  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 Still in flutter with better rate control  Ready for TEE/DCC Discussed ETOH abuse with him while wife And son in room   [employer]

## 2016-03-06 NOTE — H&P (View-Only) (Signed)
Reason for Consult: a fib   Referring Physician: Dr. Ree Kida  PCP:  Hoyt Koch, MD  Primary Cardiologist: Dr. Cathie Hoops [name] is an 65 y.o. male.    Chief Complaint:  Admitted with a fib for 10 days.  Associated with SOB and diaphoresis.  The SOB was worse yesterday so came to ER.  HPI:  48 yom with hx of a fib/flutter with flutter ablation 10/18/10.  Had been having palpitations 1-2 times a week.  He was in a flutter on EKGs on 12/6 and 12/11.  And Dr. [name] noted a flutter.   He was having SOB and nausea along with diaphoresis then seen by urgent care and treated for COPD.    Plan was to set up for DCCV after 3 weeks of xarelto. CHADS-VASc (HTN, TIA) - 3.  Prior question history of antiphospholipid antibody syndrome but this is not proven according to the patient. Plan was if DCCV did not hold then back to Dr. Rayann Heman for antiarrythmic.   Now with increased SOB and a flutter with HR to 150 at times.   Catheterization July 06, 2010, chronic total occlusion distal RCA with collaterals from right and left side. Medical therapy recommended, mild decreased LV function.  Nuclear stress test stable on 11/25/13. old inferior scar noted.   He continues to smoke.   [name], other times 120s.  He is on toprol 25 daily.  He has not used his PRN cartia.  No chest pain.   His SOB just becomes bad.      Past Medical History:  Diagnosis Date  . Adenomatous colon polyp   . Alcohol use   . Antiphospholipid antibody positive    ???In the past???but not proven according to patient  . Antiphospholipid antibody positive    ??? in the past, but not proven according to the patient.  . Atrial flutter ([place])    atrial flutter ablation,   Dr.Allred October 18, 2010  . BPH (benign prostatic hypertrophy)   . CAD (coronary artery disease)    Catheterization 2005 disease / catheterization July 06, 2010, chronic total occlusion distal RCA with collaterals from  right and left side.  Medical therapy recommended, mild decreased LV function       . Carotid artery disease ([facility]CC)    9-23% R. ICA, 30-07% LICA... Doppler.... January, 2010  /  Doppler... in January, 2011 no change  /  doppler1/27/2012...stable  6-22% RICA 63-33% LICA  . CRAO (central retinal artery occlusion)   . Ejection fraction     EF 50-55%, echo, March, 2012... inferobasal and distal septal hypokinesis /      60%, echo, November, 2008  . GERD (gastroesophageal reflux disease)   . Hypercholesterolemia   . Hypertension   . Myocardial infarction   . Nonsustained ventricular tachycardia ([place])   . Palpitations    probable SVT, rate 170,, at home,, lasted one hour,, 2012  . Raynaud's syndrome   . TIA (transient ischemic attack)    Possible small vessel tia in the past.  . Tobacco abuse     Past Surgical History:  Procedure Laterality Date  . CARDIAC CATHETERIZATION  06/27/2003   2005.... mild irregularity of the LAD..( patient had had abnormal Myoview scan)  . INGUINAL HERNIA REPAIR  2009   right with mesh.  Dr Marlou Starks  . THYROGLOSSAL DUCT CYST  2006   Dr. Wilburn Cornelia  [name]   Social History:  reports that he has been smoking Cigarettes.  He has a 20.00 pack-year smoking history. He has never used smokeless tobacco. He reports that he drinks about 12.6 oz of alcohol per week . He reports that he does not use drugs.  Allergies:  Allergies  Allergen Reactions  . Cialis [Tadalafil] Other (See Comments)    UNKNOWN  . Levitra [Vardenafil] Other (See Comments)    UNKNOWN  . Viagra [Sildenafil Citrate] Other (See Comments)    UNKNOWN    OUTPATIENT MEDICATIONS: No current facility-administered medications on file prior to encounter.    Current Outpatient Prescriptions on File Prior to Encounter  Medication Sig Dispense Refill  . atorvastatin (LIPITOR) 40 MG tablet TAKE 1 TABLET BY MOUTH EVERY DAY 30 tablet 12  . calcium  carbonate (TUMS - DOSED IN MG ELEMENTAL CALCIUM) 500 MG chewable tablet Chew 1 tablet by mouth as needed for heartburn.     . diltiazem (CARTIA XT) 120 MG 24 hr capsule Take 1 capsule (120 mg total) by mouth as needed (For elevated heart rate). 30 capsule 6  . lisinopril (PRINIVIL,ZESTRIL) 20 MG tablet Take 1 tablet (20 mg total) by mouth daily. 30 tablet 11  . metoprolol succinate (TOPROL-XL) 25 MG 24 hr tablet Take 25 mg by mouth daily.    . rivaroxaban (XARELTO) 20 MG TABS tablet Take 1 tablet (20 mg total) by mouth daily with supper. 30 tablet 6    CURRENT MEDICATIONS: Scheduled Meds: . atorvastatin  40 mg Oral Daily  . folic acid  1 mg Oral Daily  . lisinopril  20 mg Oral Daily  . metoprolol succinate  25 mg Oral Daily  . multivitamin with minerals  1 tablet Oral Daily  . pantoprazole  40 mg Oral Daily  . rivaroxaban  20 mg Oral Q supper  . sodium chloride flush  3 mL Intravenous Q12H  . thiamine  100 mg Oral Daily   Or  . thiamine  100 mg Intravenous Daily   Continuous Infusions: PRN Meds:.sodium chloride, acetaminophen, calcium carbonate, ipratropium-albuterol, LORazepam **OR** LORazepam, nicotine, ondansetron (ZOFRAN) IV, sodium chloride flush   Results for orders placed or performed during the hospital encounter of 03/04/16 (from the past 48 hour(s))  Basic metabolic panel     Status: Abnormal   Collection Time: 03/04/16 11:36 PM  Result Value Ref Range   Sodium 136 135 - 145 mmol/L   Potassium 4.4 3.5 - 5.1 mmol/L   Chloride 108 101 - 111 mmol/L   CO2 22 22 - 32 mmol/L   Glucose, Bld 104 (H) 65 - 99 mg/dL   BUN 17 6 - 20 mg/dL   Creatinine, Ser 1.09 0.61 - 1.24 mg/dL   Calcium 9.0 8.9 - 10.3 mg/dL   GFR calc non Af Amer >60 >60 mL/min   GFR calc Af Amer >60 >60 mL/min    Comment: (NOTE) The eGFR has been calculated using the CKD EPI equation. This calculation has not been validated in all clinical situations. eGFR's persistently <60 mL/min signify possible Chronic  Kidney Disease.    Anion gap 6 5 - 15  CBC with Differential     Status: None   Collection Time: 03/04/16 11:36 PM  Result Value Ref Range   WBC 8.7 4.0 - 10.5 K/uL   RBC 4.76 4.22 - 5.81 MIL/uL   Hemoglobin 15.7 13.0 - 17.0 g/dL   HCT 44.6 39.0 -  52.0 %   MCV 93.7 78.0 - 100.0 fL   MCH 33.0 26.0 - 34.0 pg   MCHC 35.2 30.0 - 36.0 g/dL   RDW 13.1 11.5 - 15.5 %   Platelets 191 150 - 400 K/uL   Neutrophils Relative % 60 %   Neutro Abs 5.3 1.7 - 7.7 K/uL   Lymphocytes Relative 30 %   Lymphs Abs 2.6 0.7 - 4.0 K/uL   Monocytes Relative 7 %   Monocytes Absolute 0.6 0.1 - 1.0 K/uL   Eosinophils Relative 2 %   Eosinophils Absolute 0.2 0.0 - 0.7 K/uL   Basophils Relative 1 %   Basophils Absolute 0.1 0.0 - 0.1 K/uL  Troponin I     Status: None   Collection Time: 03/04/16 11:36 PM  Result Value Ref Range   Troponin I <0.03 <0.03 ng/mL  Brain natriuretic peptide     Status: Abnormal   Collection Time: 03/04/16 11:36 PM  Result Value Ref Range   B Natriuretic Peptide 322.6 (H) 0.0 - 100.0 pg/mL  Magnesium     Status: None   Collection Time: 03/05/16  4:03 AM  Result Value Ref Range   Magnesium 1.9 1.7 - 2.4 mg/dL  Troponin I     Status: None   Collection Time: 03/05/16  4:03 AM  Result Value Ref Range   Troponin I <0.03 <0.03 ng/mL  Troponin I     Status: Abnormal   Collection Time: 03/05/16 10:13 AM  Result Value Ref Range   Troponin I 0.03 (HH) <0.03 ng/mL    Comment: CRITICAL RESULT CALLED TO, READ BACK BY AND VERIFIED WITH: HUBER,K RN @ 3267 03/05/16 LEONARD,A    Dg Chest 2 View  Result Date: 03/05/2016 CLINICAL DATA:  Initial evaluation for acute shortness of breath. EXAM: CHEST  2 VIEW COMPARISON:  Prior radiograph from 02/20/2016. FINDINGS: Cardiac and mediastinal silhouettes are stable in size and contour, and remain within normal limits. Aortic atherosclerosis noted. Lungs are hyperinflated with emphysematous changes present. No focal infiltrates identified. There is  subtly increased vascular congestion with interstitial prominence as compared to previous exam, suggesting mild superimposed interstitial edema. No pleural effusion. No pneumothorax. No acute osseous abnormality.  Bones are diffusely osteopenic. IMPRESSION: 1. Emphysema. Diffuse increased vascular an interstitial prominence as compared to recent radiograph from 02/20/2016, suggesting superimposed mild pulmonary interstitial edema/congestion. 2. No other active cardiopulmonary disease identified. Electronically Signed   By: Jeannine Boga M.D.   On: 03/05/2016 00:16    ROS: General:+ recent cold no fevers,+ weight changes but now in gown Skin:no rashes or ulcers HEENT:no blurred vision, no congestion CV:see HPI PUL:see HPI GI:no diarrhea constipation or melena, no indigestion GU:no hematuria, no dysuria MS:no joint pain, no claudication Neuro:no syncope, no lightheadedness Endo:no diabetes, no thyroid disease   Blood pressure 137/64, pulse 98, temperature 98.6 F (37 C), temperature source Oral, resp. rate 18, height 6' 2"  (1.88 m), weight 142 lb (64.4 kg), SpO2 98 %.  Wt Readings from Last 3 Encounters:  03/05/16 142 lb (64.4 kg)  02/22/16 154 lb 6.4 oz (70 kg)  02/20/16 153 lb (69.4 kg)    PE: General:Pleasant affect, NAD Skin:Warm and dry, brisk capillary refill HEENT:normocephalic, sclera clear, mucus membranes moist Neck:supple, no JVD, no bruits  Heart:ireg irreg without murmur, gallup, rub or click Lungs:diminished  without rales, rhonchi, or wheezes TIW:PYKD, non tender, + BS, do not palpate liver spleen or masses Ext:no lower ext edema, 2+ pedal pulses, 2+ radial pulses Neuro:alert and  oriented, MAE, follows commands, + facial symmetry    Assessment/Plan A flutter with RVR will add dilt po and decrease ACE to give more BP.  Continue BB.  Continue Xarelto.  Plan will be for TEE DCCV tomorrow if at all possible.     COPD per IM  HLD per IM.  Cecilie Kicks  Nurse  Practitioner Certified [place] Pager [phone] or after 5pm or weekends call [phone] 03/05/2016, 2:06 PM    Patient examined chart reviewed Thin white male with COPD no active wheezing No murmur on exam no fluid  Overload. Telemetry with rapid aflutter rates 120-148.  Admitted with signs of CHF EF previously normal. Has Been on xarelto since 12/8.  Post flutter ablation with atypical flutter now.  Discussed options with patient and wife Burtis Junes TEE/DCC in am risks discussed willing to proceed 12:00 tomorrow with me  [name]

## 2016-03-06 NOTE — Plan of Care (Signed)
Problem: Education: Goal: Knowledge of [facility] Education information/materials will improve Outcome: Progressing Patient aware of plan of care.  RN provided medication education on all medications administered thus far this shift.  Patient stated understanding.  RN instructed patient to call and wait for staff assistance prior to getting out of bed.  Patient agreeable to RN instruction.

## 2016-03-06 NOTE — Progress Notes (Signed)
  Echocardiogram Echocardiogram Transesophageal has been performed.  [name] 03/06/2016, 12:48 PM

## 2016-03-06 NOTE — Discharge Instructions (Addendum)
Atrial Fibrillation Atrial fibrillation is a type of irregular or rapid heartbeat (arrhythmia). In atrial fibrillation, the heart quivers continuously in a chaotic pattern. This occurs when parts of the heart receive disorganized signals that make the heart unable to pump blood normally. This can increase the risk for stroke, heart failure, and other heart-related conditions. There are different types of atrial fibrillation, including:  Paroxysmal atrial fibrillation. This type starts suddenly, and it usually stops on its own shortly after it starts.  Persistent atrial fibrillation. This type often lasts longer than a week. It may stop on its own or with treatment.  Long-lasting persistent atrial fibrillation. This type lasts longer than 12 months.  Permanent atrial fibrillation. This type does not go away. Talk with your health care provider to learn about the type of atrial fibrillation that you have. What are the causes? This condition is caused by some heart-related conditions or procedures, including:  A heart attack.  Coronary artery disease.  Heart failure.  Heart valve conditions.  High blood pressure.  Inflammation of the sac that surrounds the heart (pericarditis).  Heart surgery.  Certain heart rhythm disorders, such as Wolf-Parkinson-White syndrome. Other causes include:  Pneumonia.  Obstructive sleep apnea.  Blockage of an artery in the lungs (pulmonary embolism, or PE).  Lung cancer.  Chronic lung disease.  Thyroid problems, especially if the thyroid is overactive (hyperthyroidism).  Caffeine.  Excessive alcohol use or illegal drug use.  Use of some medicines, including certain decongestants and diet pills. Sometimes, the cause cannot be found. What increases the risk? This condition is more likely to develop in:  People who are older in age.  People who smoke.  People who have diabetes mellitus.  People who are overweight (obese).  Athletes  who exercise vigorously. What are the signs or symptoms? Symptoms of this condition include:  A feeling that your heart is beating rapidly or irregularly.  A feeling of discomfort or pain in your chest.  Shortness of breath.  Sudden light-headedness or weakness.  Getting tired easily during exercise. In some cases, there are no symptoms. How is this diagnosed? Your health care provider may be able to detect atrial fibrillation when taking your pulse. If detected, this condition may be diagnosed with:  An electrocardiogram (ECG).  A Holter monitor test that records your heartbeat patterns over a 24-hour period.  Transthoracic echocardiogram (TTE) to evaluate how blood flows through your heart.  Transesophageal echocardiogram (TEE) to view more detailed images of your heart.  A stress test.  Imaging tests, such as a CT scan or chest X-ray.  Blood tests. How is this treated? The main goals of treatment are to prevent blood clots from forming and to keep your heart beating at a normal rate and rhythm. The type of treatment that you receive depends on many factors, such as your underlying medical conditions and how you feel when you are experiencing atrial fibrillation. This condition may be treated with:  Medicine to slow down the heart rate, bring the hearts rhythm back to normal, or prevent clots from forming.  Electrical cardioversion. This is a procedure that resets your hearts rhythm by delivering a controlled, low-energy shock to the heart through your skin.  Different types of ablation, such as catheter ablation, catheter ablation with pacemaker, or surgical ablation. These procedures destroy the heart tissues that send abnormal signals. When the pacemaker is used, it is placed under your skin to help your heart beat in a regular rhythm. Follow these instructions  at home:  Take over-the counter and prescription medicines only as told by your health care provider.  If  your health care provider prescribed a blood-thinning medicine (anticoagulant), take it exactly as told. Taking too much blood-thinning medicine can cause bleeding. If you do not take enough blood-thinning medicine, you will not have the protection that you need against stroke and other problems.  Do not use tobacco products, including cigarettes, chewing tobacco, and e-cigarettes. If you need help quitting, ask your health care provider.  If you have obstructive sleep apnea, manage your condition as told by your health care provider.  Do not drink alcohol.  Do not drink beverages that contain caffeine, such as coffee, soda, and tea.  Maintain a healthy weight. Do not use diet pills unless your health care provider approves. Diet pills may make heart problems worse.  Follow diet instructions as told by your health care provider.  Exercise regularly as told by your health care provider.  Keep all follow-up visits as told by your health care provider. This is important. How is this prevented?  Avoid drinking beverages that contain caffeine or alcohol.  Avoid certain medicines, especially medicines that are used for breathing problems.  Avoid certain herbs and herbal medicines, such as those that contain ephedra or ginseng.  Do not use illegal drugs, such as cocaine and amphetamines.  Do not smoke.  Manage your high blood pressure. Contact a health care provider if:  You notice a change in the rate, rhythm, or strength of your heartbeat.  You are taking an anticoagulant and you notice increased bruising.  You tire more easily when you exercise or exert yourself. Get help right away if:  You have chest pain, abdominal pain, sweating, or weakness.  You feel nauseous.  You notice blood in your vomit, bowel movement, or urine.  You have shortness of breath.  You suddenly have swollen feet and ankles.  You feel dizzy.  You have sudden weakness or numbness of the face, arm,  or leg, especially on one side of the body.  You have trouble speaking, trouble understanding, or both (aphasia).  Your face or your eyelid droops on one side. These symptoms may represent a serious problem that is an emergency. Do not wait to see if the symptoms will go away. Get medical help right away. Call your local emergency services (911 in the [place] Do not drive yourself to the hospital.  This information is not intended to replace advice given to you by your health care provider. Make sure you discuss any questions you have with your health care provider. Document Released: 03/03/2005 Document Revised: 07/11/2015 Document Reviewed: 06/28/2014 Elsevier Interactive Patient Education  2017 [employer].  ---------------------------------------------------------------------------------------------------------------------  Information on my medicine - XARELTO (Rivaroxaban)  This medication education was reviewed with me or my healthcare representative as part of my discharge preparation.  The pharmacist that spoke with me during my hospital stay was:  Arty Baumgartner, [facility]  Why was Xarelto prescribed for you? Xarelto was prescribed for you to reduce the risk of a blood clot forming that can cause a stroke if you have a medical condition called atrial fibrillation (a type of irregular heartbeat).  What do you need to know about xarelto ? Take your Xarelto ONCE DAILY at the same time every day with your evening meal. If you have difficulty swallowing the tablet whole, you may crush it and mix in applesauce just prior to taking your dose.  Take Xarelto exactly as  prescribed by your doctor and DO NOT stop taking Xarelto without talking to the doctor who prescribed the medication.  Stopping without other stroke prevention medication to take the place of Xarelto may increase your risk of developing a clot that causes a stroke.  Refill your prescription before you run out.  After  discharge, you should have regular check-up appointments with your healthcare provider that is prescribing your Xarelto.  In the future your dose may need to be changed if your kidney function or weight changes by a significant amount.  What do you do if you miss a dose? If you are taking Xarelto ONCE DAILY and you miss a dose, take it as soon as you remember on the same day then continue your regularly scheduled once daily regimen the next day. Do not take two doses of Xarelto at the same time or on the same day.   Important Safety Information A possible side effect of Xarelto is bleeding. You should call your healthcare provider right away if you experience any of the following: ? Bleeding from an injury or your nose that does not stop. ? Unusual colored urine (red or dark brown) or unusual colored stools (red or black). ? Unusual bruising for unknown reasons. ? A serious fall or if you hit your head (even if there is no bleeding).  Some medicines may interact with Xarelto and might increase your risk of bleeding while on Xarelto. To help avoid this, consult your healthcare provider or pharmacist prior to using any new prescription or non-prescription medications, including herbals, vitamins, non-steroidal anti-inflammatory drugs (NSAIDs) and supplements.  This website has more information on Xarelto: [email or web address].[email or web address]

## 2016-03-06 NOTE — Progress Notes (Signed)
Patient ID: [name], male   DOB: 07-Mar-1951, 65 y.o.   MRN: [id number]   Patient Name: [name] Date of Encounter: 03/06/2016  Primary Cardiologist: [facility] Problem List     Principal Problem:   Acute CHF [facility]) Active Problems:   Hyperlipidemia   Alcohol abuse   TOBACCO ABUSE   Essential hypertension   CAD (coronary artery disease)   Paroxysmal atrial fibrillation (HCC)     Subjective   Less dyspnea  Inpatient Medications    Scheduled Meds: . atorvastatin  40 mg Oral Daily  . diltiazem  30 mg Oral 99991111  . folic acid  1 mg Oral Daily  . lisinopril  10 mg Oral Daily  . metoprolol succinate  25 mg Oral Daily  . multivitamin with minerals  1 tablet Oral Daily  . pantoprazole  40 mg Oral Daily  . rivaroxaban  20 mg Oral Q supper  . sodium chloride flush  3 mL Intravenous Q12H  . sodium chloride flush  3 mL Intravenous Q12H  . thiamine  100 mg Oral Daily   Or  . thiamine  100 mg Intravenous Daily   Continuous Infusions: . sodium chloride    . sodium chloride     PRN Meds: sodium chloride, acetaminophen, calcium carbonate, ipratropium-albuterol, LORazepam **OR** LORazepam, nicotine, ondansetron (ZOFRAN) IV, sodium chloride flush, sodium chloride flush   Vital Signs    Vitals:   03/05/16 2100 03/06/16 0019 03/06/16 0414 03/06/16 0649  BP: (!) 86/53 103/80 (!) 96/56 121/85  Pulse:  (!) 108 (!) 107 (!) 116  Resp:   16   Temp:   98.5 F (36.9 C)   TempSrc:   Oral   SpO2:   98%   Weight:   138 lb 14.4 oz (63 kg)   Height:        Intake/Output Summary (Last 24 hours) at 03/06/16 0952 Last data filed at 03/06/16 0030  Gross per 24 hour  Intake              620 ml  Output              225 ml  Net              395 ml   Filed Weights   03/05/16 0341 03/06/16 0414  Weight: 142 lb (64.4 kg) 138 lb 14.4 oz (63 kg)    Physical Exam    GEN: Well nourished, well developed, in no acute distress.  HEENT: Grossly normal.  Neck: Supple, no JVD,  carotid bruits, or masses. Cardiac: RRR, no murmurs, rubs, or gallops. No clubbing, cyanosis, edema.  Radials/DP/PT 2+ and equal bilaterally.  Respiratory:  Respirations regular and unlabored, clear to auscultation bilaterally. GI: Soft, nontender, nondistended, BS + x 4. MS: no deformity or atrophy. Skin: warm and dry, no rash. Neuro:  Strength and sensation are intact. Psych: AAOx3.  Normal affect.  Labs    CBC  Recent Labs  03/04/16 2336 03/06/16 0359  WBC 8.7 8.9  NEUTROABS 5.3  --   HGB 15.7 16.6  HCT 44.6 47.8  MCV 93.7 95.0  PLT 191 0000000   Basic Metabolic Panel  Recent Labs  03/04/16 2336 03/05/16 0403 03/06/16 0359  NA 136  --  138  K 4.4  --  4.2  CL 108  --  100*  CO2 22  --  29  GLUCOSE 104*  --  95  BUN 17  --  16  CREATININE  1.09  --  1.33*  CALCIUM 9.0  --  9.4  MG  --  1.9 1.7   Liver Function Tests No results for input(s): AST, ALT, ALKPHOS, BILITOT, PROT, ALBUMIN in the last 72 hours. No results for input(s): LIPASE, AMYLASE in the last 72 hours. Cardiac Enzymes  Recent Labs  03/04/16 2336 03/05/16 0403 03/05/16 1013  TROPONINI <0.03 <0.03 0.03*     Telemetry    Flutter rates 90-110 - Personally Reviewed  ECG    Flutter no ischemic changes  - Personally Reviewed  Radiology    Dg Chest 2 View  Result Date: 03/05/2016 CLINICAL DATA:  Initial evaluation for acute shortness of breath. EXAM: CHEST  2 VIEW COMPARISON:  Prior radiograph from 02/20/2016. FINDINGS: Cardiac and mediastinal silhouettes are stable in size and contour, and remain within normal limits. Aortic atherosclerosis noted. Lungs are hyperinflated with emphysematous changes present. No focal infiltrates identified. There is subtly increased vascular congestion with interstitial prominence as compared to previous exam, suggesting mild superimposed interstitial edema. No pleural effusion. No pneumothorax. No acute osseous abnormality.  Bones are diffusely osteopenic.  IMPRESSION: 1. Emphysema. Diffuse increased vascular an interstitial prominence as compared to recent radiograph from 02/20/2016, suggesting superimposed mild pulmonary interstitial edema/congestion. 2. No other active cardiopulmonary disease identified. Electronically Signed   By: Jeannine Boga M.D.   On: 03/05/2016 00:16    Cardiac Studies   Echo EF 20-25%    Assessment & Plan    CHF: related to ETOH abuse and rapid flutter. Continue diuresis / ACE Aflutter:  For TEE/DCC today on xarelto  Signed, [name], MD  03/06/2016, 9:52 AM

## 2016-03-06 NOTE — Progress Notes (Addendum)
PROGRESS NOTE    [name]  [id number] DOB: 02/19/1951 DOA: 03/04/2016 PCP: Hoyt Koch, MD   Chief Complaint  Patient presents with  . Atrial Fibrillation     Brief Narrative:  HPI on 03/05/2016 by Dr. Fuller Plan [name] is a 65 y.o. male with medical history significant of HTN, HLD, CAD, PAF on anticoagulation; who presents after being woken out of his sleep with acute onset of shortness of breath last night around 10:30 PM. Associated symptoms included palpitations, cramps, and diaphoresis. Denies having any chest pain symptoms, nausea, vomiting, fever, chills, leg swelling, change in weight. Wife noted that his blood pressure was as high as 170/100 and heart rates were into the 160s. He did not try anything to relieve symptoms. Patient has a prescription for diltiazem to take as needed, but this was not done in the acute setting and his wife immediately called EMS. Patient has been in persistent atrial fibrillation for more than 10 days now. Followed by Dr. [name] of cardiology in the outpatient setting last seen on 12/8. At that time he was switched from Pradxa to Xarelto for poor compliance with taking medication twice daily. It was decided that they would consider possible cardioversion sometime during the first week in January. Patient reports that he's been taking his medications as prescribed. Patient's wife gives history of continued tobacco abuse and alcohol abuse of anywhere from 2-3+ drinks per night on average.  Assessment & Plan   Acute Systolic CHF exacerbation/Acute respiratory failure with hypoxia -Per wife, prior to admission, patient's oxygen saturations were in the 80s at home -Upon admission, BNP  -CXR showed signs of vascular congestion -Echocardiogram 11/24/2013: EF 55% -Continue to monitor daily weights, intake/output -Given IV lasix in the ED -Continue metoprolol, lisinopril -Cardiology consulted and appreciated -Echocardiogram EF 20-25%    -Currently on room air -?Systolic heart failure secondary to alcohol use vs Afib  Persistent atrial fibrillation -CHADSVASC 1 -On Xarelto -Was on pradaxa, however was only taking once daily.  -has history of SVT -Continue metoprolol -Cardiology consulted and appreciated, plan for TEE/DCCV today  Acute kidney injury -possibly due to diuresis -Continue to monitor BMP  Essential hypertension -Continue metoprolol, lisinopril  Hyperlipidemia -Continue atorvastatin   CAD -Denies chest pain -Cycle troponins  Alcohol abuse -Counseled patient on the need of cessation of alcohol use with current medications -CIWA protocol  Tobacco abuse -Counseled on the need for cessation of tobacco  -Nicotine patch offered   DVT Prophylaxis  Xarelto  Code Status: Full  Family Communication: Wife at bedside  Disposition Plan: Currently in observation.  Consultants Cardiology  Procedures  Echocardiogram  Antibiotics   Anti-infectives    None      Subjective:   [name] seen and examined today.  Feels breathing has improved. Denies abdominal pain, N/V/D/C, dizziness, headache.  Denies chest pain.   Objective:   Vitals:   03/06/16 1121 03/06/16 1236 03/06/16 1246 03/06/16 1256  BP: 112/62 (!) 91/47 113/69 (!) 99/48  Pulse: (!) 121 75 78 71  Resp: (!) 27 (!) 24 (!) 25 (!) 21  Temp: 97.7 F (36.5 C) 97.7 F (36.5 C)    TempSrc: Oral Oral    SpO2: 96% 92% 91% 93%  Weight:      Height:        Intake/Output Summary (Last 24 hours) at 03/06/16 1307 Last data filed at 03/06/16 1230  Gross per 24 hour  Intake  860 ml  Output              225 ml  Net              635 ml   Filed Weights   03/05/16 0341 03/06/16 0414  Weight: 64.4 kg (142 lb) 63 kg (138 lb 14.4 oz)    Exam  General: Well developed, well nourished, NAD, appears stated age  44: NCAT,  mucous membranes moist.   Neck: Supple, no JVD, no masses  Cardiovascular: S1 S2  auscultated, IRR  Respiratory: Faint crackles, occ cough  Abdomen: Soft, nontender, nondistended, + bowel sounds  Extremities: warm dry without cyanosis clubbing or edema  Neuro: AAOx3, nonfocal  Psych: Normal affect and demeanor, pleasant   Data Reviewed: I have personally reviewed following labs and imaging studies  CBC:  Recent Labs Lab 03/04/16 2336 03/06/16 0359  WBC 8.7 8.9  NEUTROABS 5.3  --   HGB 15.7 16.6  HCT 44.6 47.8  MCV 93.7 95.0  PLT 191 0000000   Basic Metabolic Panel:  Recent Labs Lab 03/04/16 2336 03/05/16 0403 03/06/16 0359  NA 136  --  138  K 4.4  --  4.2  CL 108  --  100*  CO2 22  --  29  GLUCOSE 104*  --  95  BUN 17  --  16  CREATININE 1.09  --  1.33*  CALCIUM 9.0  --  9.4  MG  --  1.9 1.7   GFR: Estimated Creatinine Clearance: 50 mL/min (by C-G formula based on SCr of 1.33 mg/dL (H)). Liver Function Tests: No results for input(s): AST, ALT, ALKPHOS, BILITOT, PROT, ALBUMIN in the last 168 hours. No results for input(s): LIPASE, AMYLASE in the last 168 hours. No results for input(s): AMMONIA in the last 168 hours. Coagulation Profile:  Recent Labs Lab 03/06/16 0359  INR 1.69   Cardiac Enzymes:  Recent Labs Lab 03/04/16 2336 03/05/16 0403 03/05/16 1013  TROPONINI <0.03 <0.03 0.03*   BNP (last 3 results) No results for input(s): PROBNP in the last 8760 hours. HbA1C: No results for input(s): HGBA1C in the last 72 hours. CBG: No results for input(s): GLUCAP in the last 168 hours. Lipid Profile: No results for input(s): CHOL, HDL, LDLCALC, TRIG, CHOLHDL, LDLDIRECT in the last 72 hours. Thyroid Function Tests: No results for input(s): TSH, T4TOTAL, FREET4, T3FREE, THYROIDAB in the last 72 hours. Anemia Panel: No results for input(s): VITAMINB12, FOLATE, FERRITIN, TIBC, IRON, RETICCTPCT in the last 72 hours. Urine analysis:    Component Value Date/Time   COLORURINE YELLOW 09/05/2013 1118   APPEARANCEUR CLEAR 09/05/2013 1118     LABSPEC 1.010 09/05/2013 1118   PHURINE 6.5 09/05/2013 1118   GLUCOSEU NEGATIVE 09/05/2013 1118   GLUCOSEU NEGATIVE 12/19/2008 1147   HGBUR NEGATIVE 09/05/2013 1118   [place] 09/05/2013 1118   BILIRUBINUR neg 06/21/2012 0944   KETONESUR NEGATIVE 09/05/2013 1118   PROTEINUR NEGATIVE 09/05/2013 1118   UROBILINOGEN 0.2 09/05/2013 1118   NITRITE NEGATIVE 09/05/2013 1118   LEUKOCYTESUR NEGATIVE 09/05/2013 1118   Sepsis Labs: @LABRCNTIP (procalcitonin:4,lacticidven:4)  )No results found for this or any previous visit (from the past 240 hour(s)).    Radiology Studies: Dg Chest 2 View  Result Date: 03/05/2016 CLINICAL DATA:  Initial evaluation for acute shortness of breath. EXAM: CHEST  2 VIEW COMPARISON:  Prior radiograph from 02/20/2016. FINDINGS: Cardiac and mediastinal silhouettes are stable in size and contour, and remain within normal limits. Aortic atherosclerosis noted. Lungs are  hyperinflated with emphysematous changes present. No focal infiltrates identified. There is subtly increased vascular congestion with interstitial prominence as compared to previous exam, suggesting mild superimposed interstitial edema. No pleural effusion. No pneumothorax. No acute osseous abnormality.  Bones are diffusely osteopenic. IMPRESSION: 1. Emphysema. Diffuse increased vascular an interstitial prominence as compared to recent radiograph from 02/20/2016, suggesting superimposed mild pulmonary interstitial edema/congestion. 2. No other active cardiopulmonary disease identified. Electronically Signed   By: Jeannine Boga M.D.   On: 03/05/2016 00:16     Scheduled Meds: . atorvastatin  40 mg Oral Daily  . diltiazem  30 mg Oral 99991111  . folic acid  1 mg Oral Daily  . lisinopril  10 mg Oral Daily  . metoprolol succinate  25 mg Oral Daily  . multivitamin with minerals  1 tablet Oral Daily  . pantoprazole  40 mg Oral Daily  . rivaroxaban  20 mg Oral Q supper  . sodium chloride flush  3  mL Intravenous Q12H  . sodium chloride flush  3 mL Intravenous Q12H  . thiamine  100 mg Oral Daily   Or  . thiamine  100 mg Intravenous Daily   Continuous Infusions: . sodium chloride    . amiodarone 60 mg/hr (03/06/16 1247)   Followed by  . amiodarone       LOS: 0 days   Time Spent in minutes   30 minutes  Twylia Oka D.O. on 03/06/2016 at 1:07 PM  Between 7am to 7pm - Pager - [phone]  After 7pm go to [email or web address] - password [email or web address]  And look for the night coverage person covering for me after hours  [facility] Office  [phone]

## 2016-03-06 NOTE — Interval H&P Note (Signed)
History and Physical Interval Note:  03/06/2016 11:44 AM  [name]  has presented today for surgery, with the diagnosis of atrial fibrillation  The various methods of treatment have been discussed with the patient and family. After consideration of risks, benefits and other options for treatment, the patient has consented to  Procedure(s): TRANSESOPHAGEAL ECHOCARDIOGRAM (TEE) (N/A) CARDIOVERSION (N/A) as a surgical intervention .  The patient's history has been reviewed, patient examined, no change in status, stable for surgery.  I have reviewed the patient's chart and labs.  Questions were answered to the patient's satisfaction.     [name]

## 2016-03-06 NOTE — Anesthesia Postprocedure Evaluation (Signed)
Anesthesia Post Note  Patient: [name]  Procedure(s) Performed: Procedure(s) (LRB): TRANSESOPHAGEAL ECHOCARDIOGRAM (TEE) (N/A) CARDIOVERSION (N/A)  Patient location during evaluation: Endoscopy Anesthesia Type: General Level of consciousness: awake and alert Pain management: pain level controlled Vital Signs Assessment: post-procedure vital signs reviewed and stable Respiratory status: spontaneous breathing, nonlabored ventilation and respiratory function stable Cardiovascular status: blood pressure returned to baseline and stable Postop Assessment: no signs of nausea or vomiting Anesthetic complications: no       Last Vitals:  Vitals:   03/06/16 1315 03/06/16 1345  BP: 109/79 126/76  Pulse: 77   Resp:    Temp:      Last Pain:  Vitals:   03/06/16 1310  TempSrc: Oral  PainSc:                  Beaumont Austad

## 2016-03-06 NOTE — Anesthesia Preprocedure Evaluation (Signed)
Anesthesia Evaluation  Patient identified by MRN, date of birth, ID band Patient awake    Reviewed: Allergy & Precautions, NPO status , Patient's Chart, lab work & pertinent test results  History of Anesthesia Complications Negative for: history of anesthetic complications  Airway Mallampati: II  TM Distance: >3 FB Neck ROM: Full    Dental  (+) Missing,    Pulmonary Current Smoker,    breath sounds clear to auscultation       Cardiovascular hypertension, Pt. on medications and Pt. on home beta blockers + CAD, + Past MI, + Peripheral Vascular Disease and +CHF  + dysrhythmias  Rhythm:Irregular     Neuro/Psych PSYCHIATRIC DISORDERS TIA   GI/Hepatic Neg liver ROS, GERD  ,  Endo/Other  negative endocrine ROS  Renal/GU negative Renal ROS     Musculoskeletal   Abdominal   Peds  Hematology negative hematology ROS (+)   Anesthesia Other Findings   Reproductive/Obstetrics                             Anesthesia Physical Anesthesia Plan  ASA: III  Anesthesia Plan: General   Post-op Pain Management:    Induction: Intravenous  Airway Management Planned: Natural Airway, Nasal Cannula and Simple Face Mask  Additional Equipment: None  Intra-op Plan:   Post-operative Plan:   Informed Consent: I have reviewed the patients History and Physical, chart, labs and discussed the procedure including the risks, benefits and alternatives for the proposed anesthesia with the patient or authorized representative who has indicated his/her understanding and acceptance.   Dental advisory given  Plan Discussed with: CRNA and Surgeon  Anesthesia Plan Comments:         Anesthesia Quick Evaluation

## 2016-03-06 NOTE — Plan of Care (Signed)
Problem: Activity: Goal: Risk for activity intolerance will decrease Outcome: Progressing Patient to and from bathroom this shift, no shortness of breath noted, denies feelings of lightheaded and or dizziness.

## 2016-03-07 ENCOUNTER — Encounter (HOSPITAL_COMMUNITY)
Admission: EM | Disposition: A | Discharge status: Home / Self Care | Payer: Self-pay | Source: Home / Self Care | Attending: Internal Medicine

## 2016-03-07 ENCOUNTER — Encounter (HOSPITAL_COMMUNITY): Payer: Self-pay | Admitting: Cardiovascular Disease

## 2016-03-07 LAB — BASIC METABOLIC PANEL
Anion gap: 8 (ref 5–15)
BUN: 16 mg/dL (ref 6–20)
CHLORIDE: 100 mmol/L — AB (ref 101–111)
CO2: 27 mmol/L (ref 22–32)
CREATININE: 1.27 mg/dL — AB (ref 0.61–1.24)
Calcium: 9 mg/dL (ref 8.9–10.3)
GFR, EST NON AFRICAN AMERICAN: 58 mL/min — AB (ref >60)
Glucose, Bld: 123 mg/dL — ABNORMAL HIGH (ref 65–99)
POTASSIUM: 4.3 mmol/L (ref 3.5–5.1)
SODIUM: 135 mmol/L (ref 135–145)

## 2016-03-07 LAB — CBC
HCT: 46.5 % (ref 39.0–52.0)
Hemoglobin: 16.7 g/dL (ref 13.0–17.0)
MCH: 34 pg (ref 26.0–34.0)
MCHC: 35.9 g/dL (ref 30.0–36.0)
MCV: 94.7 fL (ref 78.0–100.0)
Platelets: 201 10*3/uL (ref 150–400)
RBC: 4.91 MIL/uL (ref 4.22–5.81)
RDW: 13.4 % (ref 11.5–15.5)
WBC: 14 10*3/uL — AB (ref 4.0–10.5)

## 2016-03-07 SURGERY — CARDIOVERSION
Anesthesia: Monitor Anesthesia Care

## 2016-03-07 MED ORDER — THIAMINE HCL 100 MG PO TABS: 100.0000 mg | ORAL_TABLET | Freq: Every day | ORAL | 0 refills | Status: DC

## 2016-03-07 MED ORDER — FOLIC ACID 1 MG PO TABS: 1.0000 mg | ORAL_TABLET | Freq: Every day | ORAL | 0 refills | Status: DC

## 2016-03-07 MED ORDER — ADULT MULTIVITAMIN W/MINERALS CH: 1.0000 | ORAL_TABLET | Freq: Every day | ORAL | 0 refills | Status: AC

## 2016-03-07 MED ORDER — FUROSEMIDE 20 MG PO TABS: 20.0000 mg | ORAL_TABLET | Freq: Every day | ORAL | 0 refills | Status: DC | PRN

## 2016-03-07 MED ORDER — NICOTINE 21 MG/24HR TD PT24: 21.0000 mg | MEDICATED_PATCH | Freq: Every day | TRANSDERMAL | 0 refills | Status: DC | PRN

## 2016-03-07 MED ORDER — LISINOPRIL 10 MG PO TABS: 10.0000 mg | ORAL_TABLET | Freq: Every day | ORAL | 0 refills | Status: DC

## 2016-03-07 MED ORDER — METOPROLOL SUCCINATE ER 50 MG PO TB24
50.0000 mg | ORAL_TABLET | Freq: Every day | ORAL | Status: DC
  Administered 2016-03-07: 50 mg via ORAL
  Filled 2016-03-07: qty 1

## 2016-03-07 MED ORDER — METOPROLOL SUCCINATE ER 50 MG PO TB24: 50.0000 mg | ORAL_TABLET | Freq: Every day | ORAL | 0 refills | Status: DC

## 2016-03-07 MED ORDER — AMIODARONE HCL 200 MG PO TABS: 200.0000 mg | ORAL_TABLET | Freq: Two times a day (BID) | ORAL | 0 refills | Status: DC

## 2016-03-07 MED ORDER — AMIODARONE HCL 200 MG PO TABS
200.0000 mg | ORAL_TABLET | Freq: Two times a day (BID) | ORAL | Status: DC
  Administered 2016-03-07: 200 mg via ORAL
  Filled 2016-03-07: qty 1

## 2016-03-07 NOTE — Discharge Summary (Signed)
Physician Discharge Summary  [name] [id number] DOB: 04/14/1950 DOA: 03/04/2016  PCP: Hoyt Koch, MD  Admit date: 03/04/2016 Discharge date: 03/07/2016  Time spent: 45 minutes  Recommendations for Outpatient Follow-up:  Patient will be discharged to home.  Patient will need to follow up with primary care provider within one week of discharge, repeat BMP.  Patient should continue medications as prescribed.  Patient should follow a heart healthy diet. Advised to stop smoking and drinking.  Discharge Diagnoses:  Acute Systolic CHF exacerbation/Acute respiratory failure with hypoxia Persistent atrial fibrillation Acute kidney injury Essentialhypertension Hyperlipidemia CAD Alcohol abuse Tobacco abuse  Discharge Condition: Stable  Diet recommendation: heart healthy  Filed Weights   03/05/16 0341 03/06/16 0414 03/07/16 0500  Weight: 64.4 kg (142 lb) 63 kg (138 lb 14.4 oz) 63.8 kg (140 lb 9.6 oz)    History of present illness:  On on 03/05/2016 by Dr. Isaiah Blakes Clarkis a 65 y.o.malewith medical history significant of HTN, HLD, CAD, PAF on anticoagulation; who presents after being woken out of his sleep with acute onset of shortness of breath last night around 10:30 PM. Associated symptoms included palpitations, cramps, and diaphoresis. Denies having any chest pain symptoms, nausea, vomiting, fever, chills, leg swelling, change in weight. Wife noted that his blood pressure was as high as 170/100 and heart rates were into the 160s. He did not try anything to relieve symptoms. Patient has a prescription for diltiazem to take as needed,but this was not done in the acute setting and his wife immediately called EMS.Patient has been in persistent atrial fibrillation for more than 10 days now. Followed by Dr. Gay Cross cardiology in the outpatient setting last seen on 12/8. At that time he was switched from [facility] for poor compliance with taking  medication twice daily. It was decided that they would consider possible cardioversion sometime during the first week in January. Patient reports that he's been taking his medications as prescribed. Patient's wife gives history of continued tobacco abuse and alcohol abuse of anywhere from 2-3+drinks per night on average.  Hospital Course:  Acute Systolic CHF exacerbation/Acute respiratory failure with hypoxia -Per wife, prior to admission, patient's oxygen saturations were in the 80s at home -Upon admission, BNP  -CXR showed signs of vascular congestion -Echocardiogram 11/24/2013: EF 55% -Continue to monitor daily weights, intake/output -Given IV lasix in the ED -Continue metoprolol, lisinopril -Cardiology consulted and appreciated -Echocardiogram EF 20-25%  -Currently on room air -?Systolic heart failure secondary to alcohol use vs Afib -Will discharge with Lasix to be used as needed. Counseled patient to use when short of breath or when he sees more than 5lb weight gain   Persistent atrial fibrillation -CHADSVASC 1 -On Xarelto -Was on pradaxa, however was only taking once daily.  -has history of SVT -Continue metoprolol -Cardiology consulted and appreciated, s/p TEE/DCCV 03/06/2016 -Patient placed on amiodarone 200mg  BID for 2 weeks, followed by 200mg  daily. Follow up with Dr. Marlou Cross.  Acute kidney injury -possibly due to diuresis -Creatinine 1.27 -Repeat BMP in one week  Essentialhypertension -Continue metoprolol, lisinopril  Hyperlipidemia -Continue atorvastatin   CAD -Denies chest pain -Cycled troponins  Alcohol abuse -Counseled patient on the need of cessation of alcohol use with current medications -No signs of withdrawal -Was placed on CIWA  Tobacco abuse -Counseled on the need for cessation of tobacco  -Nicotine patch offered   Consultants Cardiology  Procedures  Echocardiogram TEE/Cardioversion  Discharge Exam: Vitals:   03/06/16 2302  03/07/16 0500  BP:  95/76  Pulse: (!) 101 86  Resp:  18  Temp:  97.9 F (36.6 C)   Feels breathing has improved and back to baseline. Currently denies chest pain. Denies abdominal pain, N/V/D/C, dizziness, headache.    Exam  General: Well developed, well nourished, NAD, appears stated age  61: NCAT,  mucous membranes moist.   Neck: Supple, no JVD, no masses  Cardiovascular: S1 S2 auscultated, regular   Respiratory: Diminished breath sounds, clear  Abdomen: Soft, nontender, nondistended, + bowel sounds  Extremities: warm dry without cyanosis clubbing or edema  Neuro: AAOx3, nonfocal  Psych: Normal affect and demeanor, pleasant  Discharge Instructions Discharge Instructions    (HEART FAILURE PATIENTS) Call MD:  Anytime you have any of the following symptoms: 1) 3 pound weight gain in 24 hours or 5 pounds in 1 week 2) shortness of breath, with or without a dry hacking cough 3) swelling in the hands, feet or stomach 4) if you have to sleep on extra pillows at night in order to breathe.    Complete by:  As directed    Discharge instructions    Complete by:  As directed    Patient will be discharged to home.  Patient will need to follow up with primary care provider within one week of discharge.  Patient should continue medications as prescribed.  Patient should follow a heart healthy diet. Advised to stop smoking and drinking.     Current Discharge Medication List    START taking these medications   Details  amiodarone (PACERONE) 200 MG tablet Take 1 tablet (200 mg total) by mouth 2 (two) times daily. Use one tablet twice per day through 03/21/2016. Starting 03/22/2016, only take one tablet daily Qty: 60 tablet, Refills: 0    folic acid (FOLVITE) 1 MG tablet Take 1 tablet (1 mg total) by mouth daily. Qty: 30 tablet, Refills: 0    furosemide (LASIX) 20 MG tablet Take 1 tablet (20 mg total) by mouth daily as needed for fluid or edema. Use if you become short of breath or  experience weight gain. Qty: 30 tablet, Refills: 0    Multiple Vitamin (MULTIVITAMIN WITH MINERALS) TABS tablet Take 1 tablet by mouth daily. Qty: 30 tablet, Refills: 0    nicotine (NICODERM CQ - DOSED IN MG/24 HOURS) 21 mg/24hr patch Place 1 patch (21 mg total) onto the skin daily as needed (Tobacco cessation). Qty: 28 patch, Refills: 0    thiamine 100 MG tablet Take 1 tablet (100 mg total) by mouth daily. Qty: 30 tablet, Refills: 0      CONTINUE these medications which have CHANGED   Details  lisinopril (PRINIVIL,ZESTRIL) 10 MG tablet Take 1 tablet (10 mg total) by mouth daily. Qty: 30 tablet, Refills: 0    metoprolol succinate (TOPROL-XL) 50 MG 24 hr tablet Take 1 tablet (50 mg total) by mouth daily. Take with or immediately following a meal. Qty: 30 tablet, Refills: 0      CONTINUE these medications which have NOT CHANGED   Details  atorvastatin (LIPITOR) 40 MG tablet TAKE 1 TABLET BY MOUTH EVERY DAY Qty: 30 tablet, Refills: 12    calcium carbonate (TUMS - DOSED IN MG ELEMENTAL CALCIUM) 500 MG chewable tablet Chew 1 tablet by mouth as needed for heartburn.     dextromethorphan-guaiFENesin (MUCINEX DM) 30-600 MG 12hr tablet Take 1 tablet by mouth 2 (two) times daily as needed for cough.    rivaroxaban (XARELTO) 20 MG TABS tablet Take 1 tablet (  20 mg total) by mouth daily with supper. Qty: 30 tablet, Refills: 6      STOP taking these medications     diltiazem (CARTIA XT) 120 MG 24 hr capsule        Allergies  Allergen Reactions  . Cialis [Tadalafil] Other (See Comments)    UNKNOWN  . Levitra [Vardenafil] Other (See Comments)    UNKNOWN  . Viagra [Sildenafil Citrate] Other (See Comments)    UNKNOWN   Follow-up Information    Hoyt Koch, MD. Schedule an appointment as soon as possible for a visit in 1 week(s).   Specialty:  Internal Medicine Why:  Hospital follow up Contact information: [address] [place] [address] [phone]         Candee Furbish, MD. Schedule an appointment as soon as possible for a visit in 1 week(s).   Specialty:  Cardiology Why:  Hospital follow up Contact information: [id number] [place] [address] [phone]            The results of significant diagnostics from this hospitalization (including imaging, microbiology, ancillary and laboratory) are listed below for reference.    Significant Diagnostic Studies: Dg Chest 2 View  Result Date: 03/05/2016 CLINICAL DATA:  Initial evaluation for acute shortness of breath. EXAM: CHEST  2 VIEW COMPARISON:  Prior radiograph from 02/20/2016. FINDINGS: Cardiac and mediastinal silhouettes are stable in size and contour, and remain within normal limits. Aortic atherosclerosis noted. Lungs are hyperinflated with emphysematous changes present. No focal infiltrates identified. There is subtly increased vascular congestion with interstitial prominence as compared to previous exam, suggesting mild superimposed interstitial edema. No pleural effusion. No pneumothorax. No acute osseous abnormality.  Bones are diffusely osteopenic. IMPRESSION: 1. Emphysema. Diffuse increased vascular an interstitial prominence as compared to recent radiograph from 02/20/2016, suggesting superimposed mild pulmonary interstitial edema/congestion. 2. No other active cardiopulmonary disease identified. Electronically Signed   By: Jeannine Boga M.D.   On: 03/05/2016 00:16   Dg Chest 2 View  Result Date: 02/20/2016 CLINICAL DATA:  Unexplained night sweats and fatigue EXAM: CHEST  2 VIEW COMPARISON:  None. FINDINGS: The heart size is normal. Emphysematous changes are again noted. No focal airspace disease is present. There is no edema or effusion. IMPRESSION: 1. Emphysema. 2. No acute cardiopulmonary disease. Electronically Signed   By: San Morelle M.D.   On: 02/20/2016 14:12    Microbiology: No results found for this or any previous visit (from the past  240 hour(s)).   Labs: Basic Metabolic Panel:  Recent Labs Lab 03/04/16 2336 03/05/16 0403 03/06/16 0359 03/07/16 0532  NA 136  --  138 135  K 4.4  --  4.2 4.3  CL 108  --  100* 100*  CO2 22  --  29 27  GLUCOSE 104*  --  95 123*  BUN 17  --  16 16  CREATININE 1.09  --  1.33* 1.27*  CALCIUM 9.0  --  9.4 9.0  MG  --  1.9 1.7  --    Liver Function Tests: No results for input(s): AST, ALT, ALKPHOS, BILITOT, PROT, ALBUMIN in the last 168 hours. No results for input(s): LIPASE, AMYLASE in the last 168 hours. No results for input(s): AMMONIA in the last 168 hours. CBC:  Recent Labs Lab 03/04/16 2336 03/06/16 0359 03/07/16 0532  WBC 8.7 8.9 14.0*  NEUTROABS 5.3  --   --   HGB 15.7 16.6 16.7  HCT 44.6 47.8 46.5  MCV 93.7 95.0 94.7  PLT 191 188 201   Cardiac Enzymes:  Recent Labs Lab 03/04/16 2336 03/05/16 0403 03/05/16 1013  TROPONINI <0.03 <0.03 0.03*   BNP: BNP (last 3 results)  Recent Labs  03/04/16 2336  BNP 322.6*    ProBNP (last 3 results) No results for input(s): PROBNP in the last 8760 hours.  CBG: No results for input(s): GLUCAP in the last 168 hours.     SignedCristal Ford  [facility] 03/07/2016, 1:33 PM

## 2016-03-07 NOTE — Progress Notes (Signed)
I reviewed d/c instructions with patient and his wife. No further questions and d/c'd via wheelchair to private vehicle in stable condition

## 2016-03-07 NOTE — Progress Notes (Signed)
Patient ID: [name], male   DOB: [birth year], 65 y.o.   MRN: [id number]   Patient Name: [name] Date of Encounter: 03/07/2016  Primary Cardiologist: [facility] Problem List     Principal Problem:   Acute congestive heart failure [facility]) Active Problems:   Hyperlipidemia   Alcohol abuse   TOBACCO ABUSE   Essential hypertension   CAD (coronary artery disease)   Paroxysmal atrial fibrillation (HCC)   CHF (congestive heart failure) (HCC)     Subjective   No complaints maintaining NSR post TEE/DCC   Inpatient Medications    Scheduled Meds: . atorvastatin  40 mg Oral Daily  . folic acid  1 mg Oral Daily  . lisinopril  10 mg Oral Daily  . metoprolol succinate  25 mg Oral Daily  . multivitamin with minerals  1 tablet Oral Daily  . pantoprazole  40 mg Oral Daily  . rivaroxaban  20 mg Oral Q supper  . sodium chloride flush  3 mL Intravenous Q12H  . sodium chloride flush  3 mL Intravenous Q12H  . thiamine  100 mg Oral Daily   Or  . thiamine  100 mg Intravenous Daily   Continuous Infusions: . sodium chloride    . amiodarone 30 mg/hr (03/06/16 2345)   PRN Meds: sodium chloride, acetaminophen, calcium carbonate, ipratropium-albuterol, LORazepam **OR** LORazepam, nicotine, ondansetron (ZOFRAN) IV, sodium chloride flush, sodium chloride flush   Vital Signs    Vitals:   03/06/16 1715 03/06/16 2034 03/06/16 2302 03/07/16 0500  BP: 104/69 100/66  95/76  Pulse: 90 94 (!) 101 86  Resp:  18  18  Temp:  98 F (36.7 C)  97.9 F (36.6 C)  TempSrc:  Oral  Oral  SpO2: 96% 93%  96%  Weight:    140 lb 9.6 oz (63.8 kg)  Height:        Intake/Output Summary (Last 24 hours) at 03/07/16 0856 Last data filed at 03/07/16 0700  Gross per 24 hour  Intake           1022.4 ml  Output              475 ml  Net            547.4 ml   Filed Weights   03/05/16 0341 03/06/16 0414 03/07/16 0500  Weight: 142 lb (64.4 kg) 138 lb 14.4 oz (63 kg) 140 lb 9.6 oz (63.8 kg)     Physical Exam    GEN: Well nourished, well developed, in no acute distress.  HEENT: Grossly normal.  Neck: Supple, no JVD, carotid bruits, or masses. Cardiac: RRR, no murmurs, rubs, or gallops. No clubbing, cyanosis, edema.  Radials/DP/PT 2+ and equal bilaterally.  Respiratory:  Respirations regular and unlabored, clear to auscultation bilaterally. GI: Soft, nontender, nondistended, BS + x 4. MS: no deformity or atrophy. Skin: warm and dry, no rash. Neuro:  Strength and sensation are intact. Psych: AAOx3.  Normal affect.  Labs    CBC  Recent Labs  03/04/16 2336 03/06/16 0359 03/07/16 0532  WBC 8.7 8.9 14.0*  NEUTROABS 5.3  --   --   HGB 15.7 16.6 16.7  HCT 44.6 47.8 46.5  MCV 93.7 95.0 94.7  PLT 191 188 [id number]   Basic Metabolic Panel  Recent Labs  03/05/16 0403 03/06/16 0359 03/07/16 0532  NA  --  138 135  K  --  4.2 4.3  CL  --  100* 100*  CO2  --  29 27  GLUCOSE  --  95 123*  BUN  --  16 16  CREATININE  --  1.33* 1.27*  CALCIUM  --  9.4 9.0  MG 1.9 1.7  --    Liver Function Tests No results for input(s): AST, ALT, ALKPHOS, BILITOT, PROT, ALBUMIN in the last 72 hours. No results for input(s): LIPASE, AMYLASE in the last 72 hours. Cardiac Enzymes  Recent Labs  03/04/16 2336 03/05/16 0403 03/05/16 1013  TROPONINI <0.03 <0.03 0.03*     Telemetry    NSR 03/07/2016  Personally Reviewed  ECG    Flutter no ischemic changes  - Personally Reviewed  Radiology    No results found.  Cardiac Studies   Echo EF 20-25%    Assessment & Plan    CHF: related to ETOH abuse and rapid flutter. Continue diuresis / ACE Aflutter:  Post TEE/DCC would d/c on amiodarone 200 bid for 2 weeks and then 200 mg daily  Will arrange outpatient f/u with Dr [name]   Signed, [name], MD  03/07/2016, 8:56 AM  Patient ID: [name], male   DOB: 19-Mar-1950, 65 y.o.   MRN: [id number]

## 2016-03-09 ENCOUNTER — Telehealth: Payer: Self-pay | Admitting: Cardiology

## 2016-03-09 NOTE — Telephone Encounter (Signed)
Pt was cooking and suddenly did not feel well, + SOB, he sat and HR was 120 irregular at times pulse ox HR down to 37?  On BP machine it was not.  BP 100/62 and now 110/84.  He took his AM meds - and now he feels at base line.  He has echo on Wed and if still out of rhythm on Tuesday to call and ask for appt. If he develops SOB, chest pain or feels really bad to call EMS.  He has not missed any meds.  His BP is borderline so I did not increase meds.

## 2016-03-12 ENCOUNTER — Ambulatory Visit (HOSPITAL_COMMUNITY): Payer: 59 | Attending: Internal Medicine

## 2016-03-12 ENCOUNTER — Other Ambulatory Visit: Payer: Self-pay

## 2016-03-12 ENCOUNTER — Other Ambulatory Visit: Payer: 59 | Admitting: *Deleted

## 2016-03-12 DIAGNOSIS — I110 Hypertensive heart disease with heart failure: Secondary | ICD-10-CM | POA: Insufficient documentation

## 2016-03-12 DIAGNOSIS — I480 Paroxysmal atrial fibrillation: Secondary | ICD-10-CM | POA: Diagnosis not present

## 2016-03-12 DIAGNOSIS — I429 Cardiomyopathy, unspecified: Secondary | ICD-10-CM | POA: Insufficient documentation

## 2016-03-12 DIAGNOSIS — Z8673 Personal history of transient ischemic attack (TIA), and cerebral infarction without residual deficits: Secondary | ICD-10-CM | POA: Diagnosis not present

## 2016-03-12 DIAGNOSIS — I252 Old myocardial infarction: Secondary | ICD-10-CM | POA: Insufficient documentation

## 2016-03-12 DIAGNOSIS — I340 Nonrheumatic mitral (valve) insufficiency: Secondary | ICD-10-CM | POA: Insufficient documentation

## 2016-03-12 DIAGNOSIS — E785 Hyperlipidemia, unspecified: Secondary | ICD-10-CM | POA: Insufficient documentation

## 2016-03-12 DIAGNOSIS — R7989 Other specified abnormal findings of blood chemistry: Secondary | ICD-10-CM

## 2016-03-12 DIAGNOSIS — I509 Heart failure, unspecified: Secondary | ICD-10-CM | POA: Insufficient documentation

## 2016-03-12 DIAGNOSIS — R945 Abnormal results of liver function studies: Principal | ICD-10-CM

## 2016-03-12 DIAGNOSIS — I2510 Atherosclerotic heart disease of native coronary artery without angina pectoris: Secondary | ICD-10-CM | POA: Diagnosis not present

## 2016-03-12 LAB — ALT: ALT: 58 U/L — AB (ref 9–46)

## 2016-03-12 LAB — AST: AST: 43 U/L — AB (ref 10–35)

## 2016-03-14 ENCOUNTER — Telehealth: Payer: Self-pay

## 2016-03-14 DIAGNOSIS — I480 Paroxysmal atrial fibrillation: Secondary | ICD-10-CM

## 2016-03-14 NOTE — Telephone Encounter (Signed)
Informed patient of results and verbal understanding expressed.  Repeat LFTs scheduled 4/2. He understands to avoid alcohol. Patient agrees with treatment plan.

## 2016-03-14 NOTE — Telephone Encounter (Signed)
-----   Message from Jerline Pain, MD sent at 03/13/2016  8:44 PM EST ----- Mildly improved LFT's.  With use of amiodarone repeat in 3 months.  Avoid ETOH. Candee Furbish, MD

## 2016-03-18 NOTE — Progress Notes (Signed)
Cardiology Office Note   Date:  03/19/2016   ID:  [name], DOB 1950/06/05, MRN [id number]  PCP:  Hoyt Koch, MD  Cardiologist:  Dr. [name]    Chief Complaint  Patient presents with  . Atrial Fibrillation      History of Present Illness: [name] is a 66 y.o. male who presents for rapid a fluttter post TEE/DCCV 03/06/16  Amiodarone 200 BID for 2 weeks then daily.  On episode of a fib on the 24th.     Catheterization July 06, 2010, chronic total occlusion distal RCA with collaterals from right and left side. Medical therapy recommended, mild decreased LV function.  Nuclear stress test stable on 11/25/13. old inferior scar noted.   He continues to smoke.   On Xarelto  CHADS-VASc (HTN, TIA) - 3.  Today pt has stopped smoking and has only had one beer.  Congratulated for hard work.  Overall he feels much better and no more a fib.  Since the 24th.  Has not missed any Xarelto and no bleeding.   He does have some dizziness with standing but if he goes slow then he has no dizziness.    His boss believes he needs short term disability until he is improved.  Pt is agreeable to this, I will fill out papers.    Only chest pain is with coughing.  None with activity.  His SOB is improved, he has not needed lasix since discharge. And his wt is stable.  Per home scales.    Past Medical History:  Diagnosis Date  . Adenomatous colon polyp   . Alcohol use   . Antiphospholipid antibody positive    ???In the past???but not proven according to patient  . Antiphospholipid antibody positive    ??? in the past, but not proven according to the patient.  . Atrial flutter ([place])    atrial flutter ablation,   Dr.Allred October 18, 2010  . BPH (benign prostatic hypertrophy)   . CAD (coronary artery disease)    Catheterization 2005 disease / catheterization July 06, 2010, chronic total occlusion distal RCA with collaterals from right and left side.  Medical therapy recommended, mild  decreased LV function       . Carotid artery disease ([facility])    [id number] R. ICA, 123456 LICA... Doppler.... January, 2010  /  Doppler... in January, 2011 no change  /  doppler1/27/2012...stable  [id number] RICA [id number] LICA  . CHF (congestive heart failure) ([place])   . CRAO (central retinal artery occlusion)   . Ejection fraction     EF 50-55%, echo, March, 2012... inferobasal and distal septal hypokinesis /      60%, echo, November, 2008  . GERD (gastroesophageal reflux disease)   . Hypercholesterolemia   . Hypertension   . Myocardial infarction   . Nonsustained ventricular tachycardia ([place])   . Palpitations    probable SVT, rate 170,, at home,, lasted one hour,, 2012  . Raynaud's syndrome   . TIA (transient ischemic attack)    Possible small vessel tia in the past.  . Tobacco abuse     Past Surgical History:  Procedure Laterality Date  . CARDIAC CATHETERIZATION  06/27/2003   2005.... mild irregularity of the LAD..( patient had had abnormal Myoview scan)  . CARDIOVERSION N/A 03/06/2016   Procedure: CARDIOVERSION;  Surgeon: Josue Hector, MD;  Location: [facility] ENDOSCOPY;  Service: Cardiovascular;  Laterality: N/A;  . INGUINAL HERNIA REPAIR  2009   right with mesh.  Dr Marlou Starks  . TEE WITHOUT CARDIOVERSION N/A 03/06/2016   Procedure: TRANSESOPHAGEAL ECHOCARDIOGRAM (TEE);  Surgeon: Josue Hector, MD;  Location: [facility] ENDOSCOPY;  Service: Cardiovascular;  Laterality: N/A;  . THYROGLOSSAL DUCT CYST  2006   Dr. Wilburn Cornelia     Current Outpatient Prescriptions  Medication Sig Dispense Refill  . amiodarone (PACERONE) 200 MG tablet Take 1 tablet (200 mg total) by mouth 2 (two) times daily. Use one tablet twice per day through 03/21/2016. Starting 03/22/2016, only take one tablet daily 60 tablet 0  . atorvastatin (LIPITOR) 40 MG tablet TAKE 1 TABLET BY MOUTH EVERY DAY 30 tablet 12  . calcium carbonate (TUMS - DOSED IN MG ELEMENTAL CALCIUM) 500 MG chewable tablet Chew 1 tablet by mouth as needed for heartburn.       . dextromethorphan-guaiFENesin (MUCINEX DM) 30-600 MG 12hr tablet Take 1 tablet by mouth 2 (two) times daily as needed for cough.    . folic acid (FOLVITE) 1 MG tablet Take 1 tablet (1 mg total) by mouth daily. 30 tablet 0  . furosemide (LASIX) 20 MG tablet Take 1 tablet (20 mg total) by mouth daily as needed for fluid or edema. Use if you become short of breath or experience weight gain. 30 tablet 0  . lisinopril (PRINIVIL,ZESTRIL) 10 MG tablet Take 1 tablet (10 mg total) by mouth daily. 30 tablet 0  . metoprolol succinate (TOPROL-XL) 50 MG 24 hr tablet Take 1 tablet (50 mg total) by mouth daily. Take with or immediately following a meal. 30 tablet 0  . Multiple Vitamin (MULTIVITAMIN WITH MINERALS) TABS tablet Take 1 tablet by mouth daily. 30 tablet 0  . nicotine (NICODERM CQ - DOSED IN MG/24 HOURS) 21 mg/24hr patch Place 1 patch (21 mg total) onto the skin daily as needed (Tobacco cessation). 28 patch 0  . rivaroxaban (XARELTO) 20 MG TABS tablet Take 1 tablet (20 mg total) by mouth daily with supper. 30 tablet 6  . thiamine 100 MG tablet Take 1 tablet (100 mg total) by mouth daily. 30 tablet 0   No current facility-administered medications for this visit.     Allergies:   Cialis [tadalafil]; Levitra [vardenafil]; and Viagra [sildenafil citrate]    Social History:  The patient  reports that he has quit smoking. His smoking use included Cigarettes. He has a 20.00 pack-year smoking history. He has never used smokeless tobacco. He reports that he drinks about 12.6 oz of alcohol per week . He reports that he does not use drugs.   Family History:  The patient's family history is not on file. He was adopted.    ROS:  General:no colds or fevers, no weight changes on home scales.   Skin:no rashes or ulcers HEENT:no blurred vision, no congestion CV:see HPI PUL:see HPI GI:no diarrhea constipation or melena, no indigestion GU:no hematuria, no dysuria MS:no joint pain, no claudication Neuro:no  syncope, no lightheadedness Endo:no diabetes, no thyroid disease  Wt Readings from Last 3 Encounters:  03/19/16 146 lb (66.2 kg)  03/19/16 146 lb (66.2 kg)  03/07/16 140 lb 9.6 oz (63.8 kg)     PHYSICAL EXAM: VS:  BP 98/64 (BP Location: Left Arm, Patient Position: Sitting, Cuff Size: Normal)   Pulse (!) 56   Ht 6\' 2"  (1.88 m)   Wt 146 lb (66.2 kg)   BMI 18.75 kg/m  , BMI Body mass index is 18.75 kg/m. General:Pleasant affect, NAD Skin:Warm and dry, brisk capillary refill HEENT:normocephalic, sclera clear, mucus membranes moist Neck:supple, no  JVD, no bruits  Heart:S1S2 RRR without murmur, gallup, rub or click Lungs:clear without rales, rhonchi, or wheezes [id number], non tender, + BS, do not palpate liver spleen or masses Ext:no lower ext edema, 2+ pedal pulses, 2+ radial pulses Neuro:alert and oriented X 3, MAE, follows commands, + facial symmetry    EKG:  EKG is ordered today. The ekg ordered today demonstrates SB at 17 Rt ward axis  Maintaining SR    Recent Labs: 03/04/2016: B Natriuretic Peptide 322.6 03/06/2016: Magnesium 1.7 03/07/2016: BUN 16; Creatinine, Ser 1.27; Hemoglobin 16.7; Platelets 201; Potassium 4.3; Sodium 135 03/12/2016: ALT 58    Lipid Panel    Component Value Date/Time   CHOL 125 02/22/2016 1008   TRIG 74 02/22/2016 1008   HDL 52 02/22/2016 1008   CHOLHDL 2.4 02/22/2016 1008   VLDL 15 02/22/2016 1008   LDLCALC 58 02/22/2016 1008       Other studies Reviewed: Additional studies/ records that were reviewed today include: .  TEE  03/06/16  Mild MR EF 25-30% No LAA thrombus AV sclerosis  Moderate aortic debris  DCC x 3 converted with 150/200 J but then went back into flutter [place] again 200J and maint NSR with PAC;s  Echo now in SR 03/12/16 Study Conclusions  - Left ventricle: The cavity size was normal. Wall thickness was   normal. The estimated ejection fraction was 44%. Diffuse   hypokinesis. The study is not technically  sufficient to allow   evaluation of LV diastolic function. - Aortic valve: Sclerosis without stenosis. There was no   regurgitation. - Mitral valve: Mildly thickened leaflets . There was moderate   regurgitation. - Left atrium: The atrium was normal in size. - Right ventricle: The cavity size was moderately dilated. Systolic   function is mildly reduced. - Atrial septum: No defect or patent foramen ovale was identified. - Systemic veins: The IVC measures <2.1 cm, but does not collapse   >50%, suggesting an elevated RA pressure of 8 mmHg.  Impressions:  - Compared to a prior echo on 03/05/2016, the LVEF has improved   from 20-25% up to 44%.   Carotid Dopplers:  Continues to improve in 2015 with RICA [id number] stenosis and LICA [id number] stenosis  NOW: - 1-39% bilateral ICA stenosis.  >50% RECA stenosis.  Normal subclavian arteries, bilaterally.  Patent vertebral arteries with antegrade flow.  ASSESSMENT AND PLAN:  1.  Persistent a fib now maintaining SR, recheck LFTs in 2 mos.  Follow up with Dr. [name]   2. Cardiomyopathy with EF 20-25% to 44% now.  This is in SR.  3. orthostatic hypotension if he goes slow is stable and BP lower today.  We are decreasing amiodarone on Sunday so will wait and see how BP does. Though with improved EF could decrease the ace.  4.  anticoagualtion stable with Xarelto.    Current medicines are reviewed with the patient today.  The patient Has no concerns regarding medicines.  The following changes have been made:  See above Labs/ tests ordered today include:see above  Disposition:   FU:  see above  Signed, Cecilie Kicks, NP  03/19/2016 1:49 PM    [facility] [address], [place], [place], [place] Phone: [phone]; Fax: [phone]

## 2016-03-19 ENCOUNTER — Ambulatory Visit (INDEPENDENT_AMBULATORY_CARE_PROVIDER_SITE_OTHER): Payer: 59 | Admitting: Cardiology

## 2016-03-19 ENCOUNTER — Encounter: Payer: Self-pay | Admitting: Internal Medicine

## 2016-03-19 ENCOUNTER — Encounter (INDEPENDENT_AMBULATORY_CARE_PROVIDER_SITE_OTHER): Payer: Self-pay

## 2016-03-19 ENCOUNTER — Ambulatory Visit (INDEPENDENT_AMBULATORY_CARE_PROVIDER_SITE_OTHER): Payer: 59 | Admitting: Internal Medicine

## 2016-03-19 ENCOUNTER — Encounter: Payer: Self-pay | Admitting: Cardiology

## 2016-03-19 VITALS — BP 98/64 | HR 56 | Ht 74.0 in | Wt 146.0 lb

## 2016-03-19 DIAGNOSIS — F17200 Nicotine dependence, unspecified, uncomplicated: Secondary | ICD-10-CM

## 2016-03-19 DIAGNOSIS — I481 Persistent atrial fibrillation: Secondary | ICD-10-CM | POA: Diagnosis not present

## 2016-03-19 DIAGNOSIS — I10 Essential (primary) hypertension: Secondary | ICD-10-CM | POA: Diagnosis not present

## 2016-03-19 DIAGNOSIS — I2510 Atherosclerotic heart disease of native coronary artery without angina pectoris: Secondary | ICD-10-CM | POA: Diagnosis not present

## 2016-03-19 DIAGNOSIS — I6529 Occlusion and stenosis of unspecified carotid artery: Secondary | ICD-10-CM

## 2016-03-19 DIAGNOSIS — F1010 Alcohol abuse, uncomplicated: Secondary | ICD-10-CM

## 2016-03-19 DIAGNOSIS — I4819 Other persistent atrial fibrillation: Secondary | ICD-10-CM

## 2016-03-19 DIAGNOSIS — I5021 Acute systolic (congestive) heart failure: Secondary | ICD-10-CM | POA: Diagnosis not present

## 2016-03-19 NOTE — Patient Instructions (Signed)
Medication Instructions:    Your physician recommends that you continue on your current medications as directed. Please refer to the Current Medication list given to you today.  --- If you need a refill on your cardiac medications before your next appointment, please call your pharmacy. ---  Labwork:  None ordered  Testing/Procedures:  None ordered  Follow-Up:  Your physician recommends that you re-schedule a follow-up appointment in: 1 month with Dr. [name].  Thank you for choosing [facility]!!

## 2016-03-19 NOTE — Progress Notes (Signed)
   Subjective:    Patient ID: [name], male    DOB: Nov 15, 1950, 66 y.o.   MRN: [id number]  HPI The patient is a 66 YO man coming in for hospital follow up (in for a fib and acute heart failure with cardioversion with tee in the hospital). He has followed up with cardiology and they have been monitoring and adjusting his medications. He has cut back on alcohol to 1 beverage per night and stopped smoking since leaving the hospital. He is still having limited appetite and activity since leaving the hospital. He has not been able to work since that time. No chest pains, SOB, abdominal pain. No diarrhea or constipation.   PMH, [facility], social history reviewed and updated.   Review of Systems  Constitutional: Positive for activity change, appetite change and fatigue. Negative for chills, fever and unexpected weight change.  HENT: Negative.   Eyes: Negative.   Respiratory: Negative for cough, chest tightness and shortness of breath.   Cardiovascular: Negative for chest pain, palpitations and leg swelling.  Gastrointestinal: Negative for abdominal distention, abdominal pain, constipation, diarrhea, nausea and vomiting.  Musculoskeletal: Negative.   Skin: Negative.   Neurological: Negative.   Psychiatric/Behavioral: Negative.       Objective:   Physical Exam  Constitutional: He is oriented to person, place, and time. He appears well-developed and well-nourished.  HENT:  Head: Normocephalic and atraumatic.  Eyes: EOM are normal.  Neck: Normal range of motion.  Cardiovascular: Normal rate and regular rhythm.   Pulmonary/Chest: Effort normal and breath sounds normal. No respiratory distress. He has no wheezes. He has no rales.  Abdominal: Soft. Bowel sounds are normal. He exhibits no distension. There is no tenderness. There is no rebound.  Musculoskeletal: He exhibits no edema.  Neurological: He is alert and oriented to person, place, and time. Coordination normal.  Skin: Skin is warm and  dry.  Psychiatric: He has a normal mood and affect.   Vitals:   03/19/16 0900  BP: (!) 120/58  Pulse: 76  Resp: 14  Temp: 97.6 F (36.4 C)  TempSrc: Oral  SpO2: 99%  Weight: 146 lb (66.2 kg)  Height: 6\' 2"  (1.88 m)      Assessment & Plan:

## 2016-03-19 NOTE — Patient Instructions (Signed)
We will not check any labs today.  We will get you in with a nutritionist to help with high calorie foods and easy snacks.    Steps to Quit Smoking Smoking tobacco can be harmful to your health and can affect almost every organ in your body. Smoking puts you, and those around you, at risk for developing many serious chronic diseases. Quitting smoking is difficult, but it is one of the best things that you can do for your health. It is never too late to quit. What are the benefits of quitting smoking? When you quit smoking, you lower your risk of developing serious diseases and conditions, such as:  Lung cancer or lung disease, such as COPD.  Heart disease.  Stroke.  Heart attack.  Infertility.  Osteoporosis and bone fractures. Additionally, symptoms such as coughing, wheezing, and shortness of breath may get better when you quit. You may also find that you get sick less often because your body is stronger at fighting off colds and infections. If you are pregnant, quitting smoking can help to reduce your chances of having a baby of low birth weight. How do I get ready to quit? When you decide to quit smoking, create a plan to make sure that you are successful. Before you quit:  Pick a date to quit. Set a date within the next two weeks to give you time to prepare.  Write down the reasons why you are quitting. Keep this list in places where you will see it often, such as on your bathroom mirror or in your car or wallet.  Identify the people, places, things, and activities that make you want to smoke (triggers) and avoid them. Make sure to take these actions:  Throw away all cigarettes at home, at work, and in your car.  Throw away smoking accessories, such as Scientist, research (medical).  Clean your car and make sure to empty the ashtray.  Clean your home, including curtains and carpets.  Tell your family, friends, and coworkers that you are quitting. Support from your loved ones can  make quitting easier.  Talk with your health care provider about your options for quitting smoking.  Find out what treatment options are covered by your health insurance. What strategies can I use to quit smoking? Talk with your healthcare provider about different strategies to quit smoking. Some strategies include:  Quitting smoking altogether instead of gradually lessening how much you smoke over a period of time. Research shows that quitting "cold [place]" is more successful than gradually quitting.  Attending in-person counseling to help you build problem-solving skills. You are more likely to have success in quitting if you attend several counseling sessions. Even short sessions of 10 minutes can be effective.  Finding resources and support systems that can help you to quit smoking and remain smoke-free after you quit. These resources are most helpful when you use them often. They can include:  Online chats with a Social worker.  Telephone quitlines.  Printed Furniture conservator/restorer.  Support groups or group counseling.  Text messaging programs.  Mobile phone applications.  Taking medicines to help you quit smoking. (If you are pregnant or breastfeeding, talk with your health care provider first.) Some medicines contain nicotine and some do not. Both types of medicines help with cravings, but the medicines that include nicotine help to relieve withdrawal symptoms. Your health care provider may recommend:  Nicotine patches, gum, or lozenges.  Nicotine inhalers or sprays.  Non-nicotine medicine that is taken by mouth.  Talk with your health care provider about combining strategies, such as taking medicines while you are also receiving in-person counseling. Using these two strategies together makes you more likely to succeed in quitting than if you used either strategy on its own. If you are pregnant or breastfeeding, talk with your health care provider about finding counseling or other  support strategies to quit smoking. Do not take medicine to help you quit smoking unless told to do so by your health care provider. What things can I do to make it easier to quit? Quitting smoking might feel overwhelming at first, but there is a lot that you can do to make it easier. Take these important actions:  Reach out to your family and friends and ask that they support and encourage you during this time. Call telephone quitlines, reach out to support groups, or work with a counselor for support.  Ask people who smoke to avoid smoking around you.  Avoid places that trigger you to smoke, such as bars, parties, or smoke-break areas at work.  Spend time around people who do not smoke.  Lessen stress in your life, because stress can be a smoking trigger for some people. To lessen stress, try:  Exercising regularly.  Deep-breathing exercises.  Yoga.  Meditating.  Performing a body scan. This involves closing your eyes, scanning your body from head to toe, and noticing which parts of your body are particularly tense. Purposefully relax the muscles in those areas.  Download or purchase mobile phone or tablet apps (applications) that can help you stick to your quit plan by providing reminders, tips, and encouragement. There are many free apps, such as QuitGuide from the [employer] Office manager for Disease Control and Prevention). You can find other support for quitting smoking (smoking cessation) through smokefree.gov and other websites. How will I feel when I quit smoking? Within the first 24 hours of quitting smoking, you may start to feel some withdrawal symptoms. These symptoms are usually most noticeable 2-3 days after quitting, but they usually do not last beyond 2-3 weeks. Changes or symptoms that you might experience include:  Mood swings.  Restlessness, anxiety, or irritation.  Difficulty concentrating.  Dizziness.  Strong cravings for sugary foods in addition to nicotine.  Mild  weight gain.  Constipation.  Nausea.  Coughing or a sore throat.  Changes in how your medicines work in your body.  A depressed mood.  Difficulty sleeping (insomnia). After the first 2-3 weeks of quitting, you may start to notice more positive results, such as:  Improved sense of smell and taste.  Decreased coughing and sore throat.  Slower heart rate.  Lower blood pressure.  Clearer skin.  The ability to breathe more easily.  Fewer sick days. Quitting smoking is very challenging for most people. Do not get discouraged if you are not successful the first time. Some people need to make many attempts to quit before they achieve long-term success. Do your best to stick to your quit plan, and talk with your health care provider if you have any questions or concerns. This information is not intended to replace advice given to you by your health care provider. Make sure you discuss any questions you have with your health care provider. Document Released: 02/25/2001 Document Revised: 10/30/2015 Document Reviewed: 07/18/2014 Elsevier Interactive Patient Education  2017 [employer].

## 2016-03-19 NOTE — Progress Notes (Signed)
Pre visit review using our clinic review tool, if applicable. No additional management support is needed unless otherwise documented below in the visit note. 

## 2016-03-20 ENCOUNTER — Encounter: Payer: Self-pay | Admitting: Internal Medicine

## 2016-03-20 NOTE — Assessment & Plan Note (Signed)
BP at goal on his lasix, lisinopril, metoprolol, amiodarone.

## 2016-03-20 NOTE — Assessment & Plan Note (Signed)
Discussed again with him today the damage that alcohol has done and could do to him. He is now at 1 drink per day and congratulated him on that.

## 2016-03-20 NOTE — Assessment & Plan Note (Signed)
Has stopped cold [place] since the hospital and congratulated him on this success. Has not needed the nicotine patch but has available if needed. Knows that complete cessation is the only way to prevent more lung damage.

## 2016-03-20 NOTE — Assessment & Plan Note (Signed)
No flare today. Taking his meds from discharge appropriately.

## 2016-03-28 ENCOUNTER — Ambulatory Visit: Payer: 59 | Admitting: Cardiology

## 2016-03-30 ENCOUNTER — Other Ambulatory Visit: Payer: Self-pay | Admitting: Cardiology

## 2016-04-10 ENCOUNTER — Other Ambulatory Visit: Payer: Self-pay | Admitting: Cardiology

## 2016-04-10 NOTE — Telephone Encounter (Signed)
Rx refill sent to pharmacy. 

## 2016-04-15 ENCOUNTER — Other Ambulatory Visit: Payer: Self-pay | Admitting: Cardiology

## 2016-04-18 ENCOUNTER — Other Ambulatory Visit: Payer: Self-pay | Admitting: Cardiology

## 2016-04-18 ENCOUNTER — Ambulatory Visit (INDEPENDENT_AMBULATORY_CARE_PROVIDER_SITE_OTHER): Payer: 59 | Admitting: Cardiology

## 2016-04-18 ENCOUNTER — Encounter: Payer: Self-pay | Admitting: *Deleted

## 2016-04-18 ENCOUNTER — Encounter (INDEPENDENT_AMBULATORY_CARE_PROVIDER_SITE_OTHER): Payer: Self-pay

## 2016-04-18 ENCOUNTER — Encounter: Payer: Self-pay | Admitting: Cardiology

## 2016-04-18 VITALS — BP 116/62 | HR 50 | Ht 74.0 in | Wt 145.0 lb

## 2016-04-18 DIAGNOSIS — F17200 Nicotine dependence, unspecified, uncomplicated: Secondary | ICD-10-CM

## 2016-04-18 DIAGNOSIS — R002 Palpitations: Secondary | ICD-10-CM | POA: Diagnosis not present

## 2016-04-18 DIAGNOSIS — I5022 Chronic systolic (congestive) heart failure: Secondary | ICD-10-CM

## 2016-04-18 DIAGNOSIS — I480 Paroxysmal atrial fibrillation: Secondary | ICD-10-CM | POA: Diagnosis not present

## 2016-04-18 DIAGNOSIS — R42 Dizziness and giddiness: Secondary | ICD-10-CM

## 2016-04-18 DIAGNOSIS — I2510 Atherosclerotic heart disease of native coronary artery without angina pectoris: Secondary | ICD-10-CM

## 2016-04-18 DIAGNOSIS — Z79899 Other long term (current) drug therapy: Secondary | ICD-10-CM

## 2016-04-18 MED ORDER — AMIODARONE HCL 200 MG PO TABS
200.0000 mg | ORAL_TABLET | Freq: Two times a day (BID) | ORAL | 0 refills | Status: DC
Start: 2016-04-18 — End: 2016-04-23

## 2016-04-18 MED ORDER — METOPROLOL SUCCINATE ER 50 MG PO TB24: 50.0000 mg | ORAL_TABLET | Freq: Every day | ORAL | 0 refills | Status: DC

## 2016-04-18 MED ORDER — LISINOPRIL 5 MG PO TABS: 5.0000 mg | ORAL_TABLET | Freq: Every day | ORAL | 11 refills | Status: DC

## 2016-04-18 NOTE — Progress Notes (Addendum)
Cardiology Office Note   Date:  04/18/2016   ID:  [name], DOB 1950/12/05, MRN [id number]  PCP:  Hoyt Koch, MD  Cardiologist:   Candee Furbish, MD (Former Ron Parker)      History of Present Illness: [name] is a 66 y.o. male here for follow-up of atrial fibrillation/flutter. He had TEE cardioversion on 03/06/16 with amiodarone 200 mg twice a day for 2 weeks then daily and then had one episode of atrial fibrillation on 03/09/2016. Since then has been feeling well.  2 episodes sweaty, dizzy, lightheaded, 2-3 beats and quit. 10 min episodes. Watching TV at time.   Dizzy when getting up in general.   Flutter ablation 10/18/10. Currently has paroxysmal coarse atrial fibrillation. Anticoagulation, no bleeding.  Previously he felt palpitations about 1-2 times per week this is pretty standard for him. Prior question history of antiphospholipid antibody syndrome but this is not proven according to the patient.  He had the flu a week ago, persistent fevers when he was having it.    Past Medical History:  Diagnosis Date  . Adenomatous colon polyp   . Alcohol use   . Antiphospholipid antibody positive    ???In the past???but not proven according to patient  . Antiphospholipid antibody positive    ??? in the past, but not proven according to the patient.  . Atrial flutter ([place])    atrial flutter ablation,   Dr.Allred October 18, 2010  . BPH (benign prostatic hypertrophy)   . CAD (coronary artery disease)    Catheterization 2005 disease / catheterization July 06, 2010, chronic total occlusion distal RCA with collaterals from right and left side.  Medical therapy recommended, mild decreased LV function       . Carotid artery disease (HCC)    [id number] R. ICA, 123456 LICA... Doppler.... January, 2010  /  Doppler... in January, 2011 no change  /  doppler1/27/2012...stable  [id number] RICA [id number] LICA  . CHF (congestive heart failure) ([place])   . CRAO (central retinal artery occlusion)     . Ejection fraction     EF 50-55%, echo, March, 2012... inferobasal and distal septal hypokinesis /      60%, echo, November, 2008  . GERD (gastroesophageal reflux disease)   . Hypercholesterolemia   . Hypertension   . Myocardial infarction   . Nonsustained ventricular tachycardia ([place])   . Palpitations    probable SVT, rate 170,, at home,, lasted one hour,, 2012  . Raynaud's syndrome   . TIA (transient ischemic attack)    Possible small vessel tia in the past.  . Tobacco abuse     Past Surgical History:  Procedure Laterality Date  . CARDIAC CATHETERIZATION  06/27/2003   2005.... mild irregularity of the LAD..( patient had had abnormal Myoview scan)  . CARDIOVERSION N/A 03/06/2016   Procedure: CARDIOVERSION;  Surgeon: Josue Hector, MD;  Location: [facility] ENDOSCOPY;  Service: Cardiovascular;  Laterality: N/A;  . INGUINAL HERNIA REPAIR  2009   right with mesh.  Dr Marlou Starks  . TEE WITHOUT CARDIOVERSION N/A 03/06/2016   Procedure: TRANSESOPHAGEAL ECHOCARDIOGRAM (TEE);  Surgeon: Josue Hector, MD;  Location: [facility] ENDOSCOPY;  Service: Cardiovascular;  Laterality: N/A;  . THYROGLOSSAL DUCT CYST  2006   Dr. Wilburn Cornelia     Current Outpatient Prescriptions  Medication Sig Dispense Refill  . amiodarone (PACERONE) 200 MG tablet Take 1 tablet (200 mg total) by mouth 2 (two) times daily. Use one tablet twice per day through 03/21/2016.  Starting 03/22/2016, only take one tablet daily 60 tablet 0  . atorvastatin (LIPITOR) 40 MG tablet TAKE 1 TABLET BY MOUTH EVERY DAY 30 tablet 11  . calcium carbonate (TUMS - DOSED IN MG ELEMENTAL CALCIUM) 500 MG chewable tablet Chew 1 tablet by mouth as needed for heartburn.     . dextromethorphan-guaiFENesin (MUCINEX DM) 30-600 MG 12hr tablet Take 1 tablet by mouth 2 (two) times daily as needed for cough.    . folic acid (FOLVITE) 1 MG tablet TAKE 1 TABLET(1 MG) BY MOUTH DAILY 30 tablet 1  . furosemide (LASIX) 20 MG tablet Take 1 tablet (20 mg total) by mouth daily as  needed for fluid or edema. Use if you become short of breath or experience weight gain. 30 tablet 0  . lisinopril (PRINIVIL,ZESTRIL) 5 MG tablet Take 1 tablet (5 mg total) by mouth daily. 30 tablet 11  . metoprolol succinate (TOPROL-XL) 50 MG 24 hr tablet Take 1 tablet (50 mg total) by mouth daily. Take with or immediately following a meal. 30 tablet 0  . Multiple Vitamin (MULTIVITAMIN WITH MINERALS) TABS tablet Take 1 tablet by mouth daily. 30 tablet 0  . nicotine (NICODERM CQ - DOSED IN MG/24 HOURS) 21 mg/24hr patch Place 1 patch (21 mg total) onto the skin daily as needed (Tobacco cessation). 28 patch 0  . rivaroxaban (XARELTO) 20 MG TABS tablet Take 1 tablet (20 mg total) by mouth daily with supper. 30 tablet 6  . thiamine 100 MG tablet Take 1 tablet (100 mg total) by mouth daily. 30 tablet 0   No current facility-administered medications for this visit.     Allergies:   Cialis [tadalafil]; Levitra [vardenafil]; and Viagra [sildenafil citrate]    Social History:  The patient  reports that he has quit smoking. His smoking use included Cigarettes. He has a 20.00 pack-year smoking history. He has never used smokeless tobacco. He reports that he drinks about 12.6 oz of alcohol per week . He reports that he does not use drugs.   Family History:  The patient's family history is not on file. He was adopted.    ROS:  Please see the history of present illness.   Otherwise, review of systems are positive for none.   All other systems are reviewed and negative.    PHYSICAL EXAM: VS:  BP 116/62   Pulse (!) 50   Ht 6\' 2"  (1.88 m)   Wt 145 lb (65.8 kg)   BMI 18.62 kg/m  , BMI Body mass index is 18.62 kg/m. GEN: Well nourished, well developed, in no acute distress  HEENT: normal  Neck: no JVD, carotid bruits, or masses Cardiac: Irregularly irregular, mildly tachycardic; no murmurs, rubs, or gallops,no edema  Respiratory:  clear to auscultation bilaterally, normal work of breathing GI: soft,  nontender, nondistended, + BS MS: no deformity or atrophy  Skin: warm and dry, no rash, blue nose, Raynaud's changes Neuro:  Strength and sensation are intact Psych: euthymic mood, full affect   EKG:   Today 03/30/15-sinus rhythm, 71, small Q waves inferiorly, otherwise rightward axis personally viewed-no significant change from prior.   Recent Labs: 03/04/2016: B Natriuretic Peptide 322.6 03/06/2016: Magnesium 1.7 03/07/2016: BUN 16; Creatinine, Ser 1.27; Hemoglobin 16.7; Platelets 201; Potassium 4.3; Sodium 135 03/12/2016: ALT 58    Lipid Panel    Component Value Date/Time   CHOL 125 02/22/2016 1008   TRIG 74 02/22/2016 1008   HDL 52 02/22/2016 1008   CHOLHDL 2.4 02/22/2016 1008  VLDL 15 02/22/2016 1008   LDLCALC 58 02/22/2016 1008      Wt Readings from Last 3 Encounters:  04/18/16 145 lb (65.8 kg)  03/19/16 146 lb (66.2 kg)  03/19/16 146 lb (66.2 kg)      Other studies Reviewed: Additional studies/ records that were reviewed today include:  Prior labs, echocardiogram, office notes, carotid Doppler. Review of the above records demonstrates:  As above   ASSESSMENT AND PLAN:  Chronic systolic heart failure  - Possible alcohol etiology, tachycardia etiology  - Decreasing lisinopril to 5 mg because of dizziness, low blood pressure  - Continue with Toprol  - EF originally 25% now 44% after reestablishing sinus rhythm.  - Lasix as needed  Atrial flutter  - post ablation 8/12, Dr. Rayann Heman  - had DCCV TEE 03/06/16 amiodarone. Brief episode 12/24 then NSR since.   Atrial fibrillation  - Paroxysmal, coarse. This has returned. Prior EKG in January 2017 was sinus rhythm. He seems to be symptomatic with this currently. Feels better in NSR.   - CHADS-VASc (HTN, TIA) - 3  - Anticoagulated previously with Pradaxa but missed several second daily doses. For symptom location, we will change him to Xarelto. I've expressed the importance of taking this medication.  - Complete  metabolic profile and CBC every 6 months  - Ejection fraction normal-11/24/13, however decreased to 20-25% prior to cardioversion on TEE 02/2016. Repeat echo with NSR EF 44%.   - He understands that if he has been in atrial fibrillation for quite some time, may be challenging for [place] to maintain sinus rhythm. He is interested in establishing sinus rhythm. He may require further antiarrhythmic medication such as Tikosyn in the future. If cardioversion does not hold, I will likely send back to Dr. Rayann Heman for further discussion and possibility of future ablation efforts.  Understands that alcohol can cause worsening atrial fibrillation. Risks and benefits have been discussed.  Chronic anticoagulation  - Pradaxa changed to Xarelto because of compliance issues.  - No bleeding noted  Coronary artery disease  - Nuclear stress test stable on 11/25/13. old inferior scar noted - catheterization July 06, 2010, chronic total occlusion distal RCA with collaterals from right and left side. Medical therapy recommended, mild decreased LV function   Carotid artery disease  - 39% right, 59% left-08/16/14  - 1 year f/u Doppler-he is due for this. We will help set up Dopplers.  - Prevention  Hyperlipidemia  - Atorvastatin 40  - LDL in the 70s. Excellent. I will check lipid panel and liver function today.  Tobacco use  - Encourage cessation.  High-risk medication use  -  on amiodarone, continue.   Dizziness  - I will place an event monitor. He is describing what could possibly be trigeminy with 2 beats then stop, 2 beats then stop. He may also have postconversion pauses if he is going in and out of atrial fibrillation which could lead was dizziness while sitting down. We will see.  He may return to work 3 days a week for 9 hours a day without restrictions.   Disposition:  [name] 6 weeks  Signed, Candee Furbish, MD  04/18/2016 8:47 AM    [facility] [address], [place], [place]   [address] Phone: [phone]; Fax: [phone]

## 2016-04-18 NOTE — Patient Instructions (Signed)
Medication Instructions:  Please decrease Lisinopril to 5 mg a day. Continue all other medications as listed.  Testing/Procedures: Your physician has recommended that you wear an event monitor for 30 days. Event monitors are medical devices that record the heart's electrical activity. Doctors most often [place] these monitors to diagnose arrhythmias. Arrhythmias are problems with the speed or rhythm of the heartbeat. The monitor is a small, portable device. You can wear one while you do your normal daily activities. This is usually used to diagnose what is causing palpitations/syncope (passing out).  Follow-Up: Follow up in 6 weeks with Dr [name].  If you need a refill on your cardiac medications before your next appointment, please call your pharmacy.  Thank you for choosing [facility]!!

## 2016-04-21 ENCOUNTER — Ambulatory Visit (INDEPENDENT_AMBULATORY_CARE_PROVIDER_SITE_OTHER): Payer: 59

## 2016-04-21 DIAGNOSIS — R42 Dizziness and giddiness: Secondary | ICD-10-CM | POA: Diagnosis not present

## 2016-04-23 ENCOUNTER — Other Ambulatory Visit: Payer: Self-pay | Admitting: Cardiology

## 2016-04-23 MED ORDER — AMIODARONE HCL 200 MG PO TABS: ORAL_TABLET | ORAL | 0 refills | Status: DC

## 2016-05-06 ENCOUNTER — Encounter: Payer: Self-pay | Admitting: Pulmonary Disease

## 2016-05-06 ENCOUNTER — Ambulatory Visit (INDEPENDENT_AMBULATORY_CARE_PROVIDER_SITE_OTHER): Payer: 59 | Admitting: Pulmonary Disease

## 2016-05-06 VITALS — BP 142/70 | HR 65 | Ht 74.0 in | Wt 151.0 lb

## 2016-05-06 DIAGNOSIS — Z23 Encounter for immunization: Secondary | ICD-10-CM

## 2016-05-06 DIAGNOSIS — J432 Centrilobular emphysema: Secondary | ICD-10-CM

## 2016-05-06 DIAGNOSIS — I4891 Unspecified atrial fibrillation: Secondary | ICD-10-CM | POA: Diagnosis not present

## 2016-05-06 MED ORDER — LEVALBUTEROL TARTRATE 45 MCG/ACT IN AERO: 2.0000 | INHALATION_SPRAY | RESPIRATORY_TRACT | 2 refills | Status: DC | PRN

## 2016-05-06 NOTE — Patient Instructions (Signed)
In the event of shortness of breath, chest tightness, wheezing I recommend that you use two puffs of Xopenex every 4-6 hours as needed for shortness of breath When you return we will give you the other pneumonia vaccine Let us know if you have trouble breathing in the next 3 months Follow-up in 3 months

## 2016-05-06 NOTE — Assessment & Plan Note (Signed)
His chest x-ray from December 2017 shows centrilobular emphysema. I explained to him today the concept of COPD and how it encompasses both chronic bronchitis and emphysema. From a clinical standpoint he does not have signs or symptoms of chronic bronchitis. However, his tobacco use is clearly led to centrilobular emphysema. I believe that the majority of his dyspnea he has experienced in the last year was related to atrial fibrillation as he states that he's had resolution of his shortness of breath with his most recent cardioversion.  However, he is at increased risk for severe respiratory infections and we discussed this at length today.  Given his lack of dyspnea at this time I don't see an indication for a long-acting bronchodilator. Further, he reports no severe episodes of bronchitis in the last year.  Plan: Use Xopenex as needed for shortness of breath Spirometry testing today Prevnar vaccine today Pneumovax next visit He refuses flu shot today because he says he's already had the flu this year Follow-up 3 months

## 2016-05-06 NOTE — Addendum Note (Signed)
Addended by: Len Blalock on: 05/06/2016 10:07 AM   Modules accepted: Orders

## 2016-05-06 NOTE — Assessment & Plan Note (Signed)
In sinus rhythm today.    Plan Continue f/u with Dr. [name] Avoid albuterol Rx Xopenex 2 puffs q4-6 hours prn dyspnea, educated on when/how to use

## 2016-05-06 NOTE — Progress Notes (Signed)
Subjective:    Patient ID: [name], male    DOB: [birth year], 66 y.o.   MRN: [id number]  Synopsis: Referred in February 2018 for centrilobular emphysema. Smoked 2 packs of cigarettes daily for 42 years, quit smoking in December 2017. He has significant atrial fibrillation.  HPI Chief Complaint  Patient presents with  . Advice Only    Referred by Molli Barrows for emphysema.      [name] is here to see me today because of trouble breathing: > he says he doesn't know if it is from his heart or lungs > he was recently hospitalized and had a cardioversion for Afib which helped this signficantly > he has never been told he had a lung problem > in November he was told he may have emphysema based on a CXR > he reports several episodes of trouble breathing associated with Afib and RVR which have required ER and urgent care visits.  During one of these visits he had a CXR which showed emphysema > He has seen resolution of the dyspnea since the cardioversion, but he still wants to get checked out > he still has dyspnea climbing stairs > no environmental exposures make him more dyspneic > lying flat doesn't matter  He has a cough sometimes and will occasionally bring up white or clear mucus.  He brought up blood briefly after his TEE cardioversion. None since then.  He has smoked as much as 2 packs per day, less than 1 ppd in the last five years.  He smoked for 42 years.  He quit in December 2017 cold [place] and hasn't started back since.     Past Medical History:  Diagnosis Date  . Adenomatous colon polyp   . Alcohol use   . Antiphospholipid antibody positive    ???In the past???but not proven according to patient  . Antiphospholipid antibody positive    ??? in the past, but not proven according to the patient.  . Atrial flutter ([place])    atrial flutter ablation,   Dr.Allred October 18, 2010  . BPH (benign prostatic hypertrophy)   . CAD (coronary artery disease)    Catheterization  2005 disease / catheterization July 06, 2010, chronic total occlusion distal RCA with collaterals from right and left side.  Medical therapy recommended, mild decreased LV function       . Carotid artery disease (HCC)    [id number] R. ICA, 123456 LICA... Doppler.... January, 2010  /  Doppler... in January, 2011 no change  /  doppler1/27/2012...stable  [id number] RICA [id number] LICA  . CHF (congestive heart failure) ([place])   . CRAO (central retinal artery occlusion)   . Ejection fraction     EF 50-55%, echo, March, 2012... inferobasal and distal septal hypokinesis /      60%, echo, November, 2008  . GERD (gastroesophageal reflux disease)   . Hypercholesterolemia   . Hypertension   . Myocardial infarction   . Nonsustained ventricular tachycardia ([place])   . Palpitations    probable SVT, rate 170,, at home,, lasted one hour,, 2012  . Raynaud's syndrome   . TIA (transient ischemic attack)    Possible small vessel tia in the past.  . Tobacco abuse      [name]     Social History   Social History  . Marital status: Married    Spouse name: N/A  . Number of children: N/A  . Years of education:  N/A   Occupational History  . land Surveyor    Social History Main Topics  . Smoking status: Former Smoker    Packs/day: 1.00    Years: 40.00    Types: Cigarettes    Quit date: 03/05/2016  . Smokeless tobacco: Never Used     Comment: 1 pack a day  . Alcohol use 12.6 oz/week    21 Cans of beer per week     Comment: he continues to drink heavily despite my recommendation to quit  . Drug use: No  . Sexual activity: Not on file   Other Topics Concern  . Not on file   Social History Narrative  . No narrative on file     Allergies  Allergen Reactions  . Cialis [Tadalafil] Other (See Comments)    UNKNOWN  . Levitra [Vardenafil] Other (See Comments)    UNKNOWN  . Viagra [Sildenafil Citrate] Other (See Comments)    UNKNOWN     Outpatient  Medications Prior to Visit  Medication Sig Dispense Refill  . amiodarone (PACERONE) 200 MG tablet Take one tablet by mouth twice per day through 03/21/2016. Starting 03/22/2016, only take one tablet by mouth daily 60 tablet 0  . atorvastatin (LIPITOR) 40 MG tablet TAKE 1 TABLET BY MOUTH EVERY DAY 30 tablet 11  . calcium carbonate (TUMS - DOSED IN MG ELEMENTAL CALCIUM) 500 MG chewable tablet Chew 1 tablet by mouth as needed for heartburn.     . dextromethorphan-guaiFENesin (MUCINEX DM) 30-600 MG 12hr tablet Take 1 tablet by mouth 2 (two) times daily as needed for cough.    . folic acid (FOLVITE) 1 MG tablet TAKE 1 TABLET(1 MG) BY MOUTH DAILY 30 tablet 1  . furosemide (LASIX) 20 MG tablet Take 1 tablet (20 mg total) by mouth daily as needed for fluid or edema. Use if you become short of breath or experience weight gain. 30 tablet 0  . lisinopril (PRINIVIL,ZESTRIL) 5 MG tablet Take 1 tablet (5 mg total) by mouth daily. 30 tablet 11  . metoprolol succinate (TOPROL-XL) 50 MG 24 hr tablet Take 1 tablet (50 mg total) by mouth daily. Take with or immediately following a meal. 30 tablet 0  . Multiple Vitamin (MULTIVITAMIN WITH MINERALS) TABS tablet Take 1 tablet by mouth daily. 30 tablet 0  . rivaroxaban (XARELTO) 20 MG TABS tablet Take 1 tablet (20 mg total) by mouth daily with supper. 30 tablet 6  . thiamine 100 MG tablet Take 1 tablet (100 mg total) by mouth daily. 30 tablet 0  . nicotine (NICODERM CQ - DOSED IN MG/24 HOURS) 21 mg/24hr patch Place 1 patch (21 mg total) onto the skin daily as needed (Tobacco cessation). (Patient not taking: Reported on 05/06/2016) 28 patch 0   No facility-administered medications prior to visit.       Review of Systems  Constitutional: Negative for fever and unexpected weight change.  HENT: Negative for congestion, dental problem, ear pain, nosebleeds, postnasal drip, rhinorrhea, sinus pressure, sneezing, sore throat and trouble swallowing.   Eyes: Negative for redness  and itching.  Respiratory: Negative for cough, chest tightness, shortness of breath and wheezing.   Cardiovascular: Negative for palpitations and leg swelling.  Gastrointestinal: Negative for nausea and vomiting.  Genitourinary: Negative for dysuria.  Musculoskeletal: Negative for joint swelling.  Skin: Negative for rash.  Neurological: Negative for headaches.  Hematological: Does not bruise/bleed easily.  Psychiatric/Behavioral: Negative for dysphoric mood. The patient is not nervous/anxious.  Objective:   Physical Exam Vitals:   05/06/16 0858  BP: (!) 142/70  Pulse: 65  SpO2: 100%  Weight: 151 lb (68.5 kg)  Height: 6\' 2"  (1.88 m)   Walked 500 feet on room air and his O2 saturation remained above 98%  Gen: well appearing, no acute distress HENT: NCAT, OP clear, neck supple without masses Eyes: PERRL, EOMi Lymph: no cervical lymphadenopathy PULM: CTA B CV: RRR, no mgr, no JVD GI: BS+, soft, nontender, no hsm Derm: cyanosis type discoloration of the nose, fingers MSK: normal bulk and tone Neuro: A&Ox4, CN II-XII intact, strength 5/5 in all 4 extremities Psyche: normal mood and affect   Imaging: December 2017 chest x-ray images personally reviewed showing significant emphysema with diaphragm flattening  PFT: February 2018 spirometry: Ratio 54%, FEV1 1.67 L 42% predicted  Records reviewed from his January 2018 visit with his primary care physician who cared for him for hypertension with lisinopril and counseled him on the importance of staying away from cigarettes.     Assessment & Plan:  Centrilobular emphysema ([place]) His chest x-ray from December 2017 shows centrilobular emphysema. I explained to him today the concept of COPD and how it encompasses both chronic bronchitis and emphysema. From a clinical standpoint he does not have signs or symptoms of chronic bronchitis. However, his tobacco use is clearly led to centrilobular emphysema. I believe that the majority  of his dyspnea he has experienced in the last year was related to atrial fibrillation as he states that he's had resolution of his shortness of breath with his most recent cardioversion.  However, he is at increased risk for severe respiratory infections and we discussed this at length today.  Given his lack of dyspnea at this time I don't see an indication for a long-acting bronchodilator. Further, he reports no severe episodes of bronchitis in the last year.  Plan: Use Xopenex as needed for shortness of breath Spirometry testing today Prevnar vaccine today Pneumovax next visit He refuses flu shot today because he says he's already had the flu this year Follow-up 3 months  Atrial fibrillation with RVR ([place]) In sinus rhythm today.    Plan Continue f/u with Dr. [name] Avoid albuterol Rx Xopenex 2 puffs q4-6 hours prn dyspnea, educated on when/how to use    Current Outpatient Prescriptions:  .  amiodarone (PACERONE) 200 MG tablet, Take one tablet by mouth twice per day through 03/21/2016. Starting 03/22/2016, only take one tablet by mouth daily, Disp: 60 tablet, Rfl: 0 .  atorvastatin (LIPITOR) 40 MG tablet, TAKE 1 TABLET BY MOUTH EVERY DAY, Disp: 30 tablet, Rfl: 11 .  calcium carbonate (TUMS - DOSED IN MG ELEMENTAL CALCIUM) 500 MG chewable tablet, Chew 1 tablet by mouth as needed for heartburn. , Disp: , Rfl:  .  dextromethorphan-guaiFENesin (MUCINEX DM) 30-600 MG 12hr tablet, Take 1 tablet by mouth 2 (two) times daily as needed for cough., Disp: , Rfl:  .  folic acid (FOLVITE) 1 MG tablet, TAKE 1 TABLET(1 MG) BY MOUTH DAILY, Disp: 30 tablet, Rfl: 1 .  furosemide (LASIX) 20 MG tablet, Take 1 tablet (20 mg total) by mouth daily as needed for fluid or edema. Use if you become short of breath or experience weight gain., Disp: 30 tablet, Rfl: 0 .  lisinopril (PRINIVIL,ZESTRIL) 5 MG tablet, Take 1 tablet (5 mg total) by mouth daily., Disp: 30 tablet, Rfl: 11 .  metoprolol succinate (TOPROL-XL)  50 MG 24 hr tablet, Take 1 tablet (50 mg total)  by mouth daily. Take with or immediately following a meal., Disp: 30 tablet, Rfl: 0 .  Multiple Vitamin (MULTIVITAMIN WITH MINERALS) TABS tablet, Take 1 tablet by mouth daily., Disp: 30 tablet, Rfl: 0 .  rivaroxaban (XARELTO) 20 MG TABS tablet, Take 1 tablet (20 mg total) by mouth daily with supper., Disp: 30 tablet, Rfl: 6 .  thiamine 100 MG tablet, Take 1 tablet (100 mg total) by mouth daily., Disp: 30 tablet, Rfl: 0

## 2016-05-12 ENCOUNTER — Telehealth: Payer: Self-pay | Admitting: Cardiology

## 2016-05-12 NOTE — Telephone Encounter (Signed)
Printed and taken to MR to be faxed 

## 2016-05-12 NOTE — Telephone Encounter (Signed)
Pt's employer calling to get clarification on return to work note. It states 3 days a week, they need to know if there is  a limitation on how many hours a day-pls call [phone]

## 2016-05-12 NOTE — Telephone Encounter (Signed)
He may work 3 days a week for 9 hours a day. Candee Furbish, MD

## 2016-05-12 NOTE — Telephone Encounter (Signed)
Employeer calling to clarify how many hours per day the patient can work and how many per week.  Advised I will review with Dr [name] and call back.  She would like the information faxed to [phone].

## 2016-05-23 ENCOUNTER — Encounter: Payer: Self-pay | Admitting: *Deleted

## 2016-05-30 ENCOUNTER — Encounter: Payer: Self-pay | Admitting: Cardiology

## 2016-05-30 ENCOUNTER — Ambulatory Visit (INDEPENDENT_AMBULATORY_CARE_PROVIDER_SITE_OTHER): Payer: 59 | Admitting: Cardiology

## 2016-05-30 VITALS — BP 90/64 | HR 61 | Ht 74.0 in | Wt 153.0 lb

## 2016-05-30 DIAGNOSIS — I779 Disorder of arteries and arterioles, unspecified: Secondary | ICD-10-CM | POA: Diagnosis not present

## 2016-05-30 DIAGNOSIS — I2510 Atherosclerotic heart disease of native coronary artery without angina pectoris: Secondary | ICD-10-CM

## 2016-05-30 DIAGNOSIS — I10 Essential (primary) hypertension: Secondary | ICD-10-CM

## 2016-05-30 DIAGNOSIS — E7800 Pure hypercholesterolemia, unspecified: Secondary | ICD-10-CM

## 2016-05-30 DIAGNOSIS — I5022 Chronic systolic (congestive) heart failure: Secondary | ICD-10-CM

## 2016-05-30 DIAGNOSIS — Z79899 Other long term (current) drug therapy: Secondary | ICD-10-CM | POA: Diagnosis not present

## 2016-05-30 DIAGNOSIS — I739 Peripheral vascular disease, unspecified: Secondary | ICD-10-CM

## 2016-05-30 DIAGNOSIS — J438 Other emphysema: Secondary | ICD-10-CM

## 2016-05-30 DIAGNOSIS — I480 Paroxysmal atrial fibrillation: Secondary | ICD-10-CM | POA: Diagnosis not present

## 2016-05-30 MED ORDER — AMIODARONE HCL 200 MG PO TABS: 200.0000 mg | ORAL_TABLET | Freq: Every day | ORAL | 11 refills | Status: DC

## 2016-05-30 MED ORDER — METOPROLOL SUCCINATE ER 50 MG PO TB24: 50.0000 mg | ORAL_TABLET | Freq: Every day | ORAL | 11 refills | Status: DC

## 2016-05-30 NOTE — Progress Notes (Signed)
Cardiology Office Note   Date:  05/30/2016   ID:  [name], DOB 02/05/1951, MRN [id number]  PCP:  Hoyt Koch, MD  Cardiologist:   Candee Furbish, MD (Former Ron Parker)      History of Present Illness: [name] is a 66 y.o. male here for follow-up of atrial fibrillation/flutter. He had TEE cardioversion on 03/06/16 with amiodarone 200 mg twice a day for 2 weeks then daily and then had one episode of atrial fibrillation on 03/09/2016.   2 episodes sweaty, dizzy, lightheaded, 2-3 beats and quit. 10 min episodes. Watching TV at time.   Dizzy when getting up in general.   Flutter ablation 10/18/10. Currently has paroxysmal coarse atrial fibrillation. Anticoagulation, no bleeding.  Previously he felt palpitations about 1-2 times per week this is pretty standard for him. Prior question history of antiphospholipid antibody syndrome but this is not proven according to the patient.  He underwent a heart monitor and he is here today to discuss. No significant chest pain, no shortness of breath, no syncope  Has been feeling some palpitations with dizziness and lightheadedness. Wife is concerned about his eating habits and potential atrial fibrillation getting triggered at his workplace.    Past Medical History:  Diagnosis Date  . Adenomatous colon polyp   . Alcohol use   . Antiphospholipid antibody positive    ???In the past???but not proven according to patient  . Antiphospholipid antibody positive    ??? in the past, but not proven according to the patient.  . Atrial flutter ([place])    atrial flutter ablation,   Dr.Allred October 18, 2010  . BPH (benign prostatic hypertrophy)   . CAD (coronary artery disease)    Catheterization 2005 disease / catheterization July 06, 2010, chronic total occlusion distal RCA with collaterals from right and left side.  Medical therapy recommended, mild decreased LV function       . Carotid artery disease (HCC)    4-26% R. ICA, 83-41% LICA...  Doppler.... January, 2010  /  Doppler... in January, 2011 no change  /  doppler1/27/2012...stable  9-62% RICA 22-97% LICA  . CHF (congestive heart failure) ([place])   . CRAO (central retinal artery occlusion)   . Ejection fraction     EF 50-55%, echo, March, 2012... inferobasal and distal septal hypokinesis /      60%, echo, November, 2008  . GERD (gastroesophageal reflux disease)   . Hypercholesterolemia   . Hypertension   . Myocardial infarction   . Nonsustained ventricular tachycardia ([place])   . Palpitations    probable SVT, rate 170,, at home,, lasted one hour,, 2012  . Raynaud's syndrome   . TIA (transient ischemic attack)    Possible small vessel tia in the past.  . Tobacco abuse     Past Surgical History:  Procedure Laterality Date  . CARDIAC CATHETERIZATION  06/27/2003   2005.... mild irregularity of the LAD..( patient had had abnormal Myoview scan)  . CARDIOVERSION N/A 03/06/2016   Procedure: CARDIOVERSION;  Surgeon: Josue Hector, MD;  [facility] ENDOSCOPY;  Service: Cardiovascular;  Laterality: N/A;  . INGUINAL HERNIA REPAIR  2009   right with mesh.  Dr Marlou Starks  . TEE WITHOUT CARDIOVERSION N/A 03/06/2016   Procedure: TRANSESOPHAGEAL ECHOCARDIOGRAM (TEE);  Surgeon: Josue Hector, MD;  Location: [facility];  Service: Cardiovascular;  Laterality: N/A;  . THYROGLOSSAL DUCT CYST  2006   Dr. Wilburn Cornelia     Current Outpatient Prescriptions  Medication Sig Dispense  Refill  . amiodarone (PACERONE) 200 MG tablet Take 1 tablet (200 mg total) by mouth daily. 30 tablet 11  . atorvastatin (LIPITOR) 40 MG tablet TAKE 1 TABLET BY MOUTH EVERY DAY 30 tablet 11  . calcium carbonate (TUMS - DOSED IN MG ELEMENTAL CALCIUM) 500 MG chewable tablet Chew 1 tablet by mouth as needed for heartburn.     . dextromethorphan-guaiFENesin (MUCINEX DM) 30-600 MG 12hr tablet Take 1 tablet by mouth 2 (two) times daily as needed for cough.    . folic acid (FOLVITE) 1 MG tablet TAKE 1 TABLET(1 MG) BY MOUTH  DAILY 30 tablet 1  . furosemide (LASIX) 20 MG tablet Take 1 tablet (20 mg total) by mouth daily as needed for fluid or edema. Use if you become short of breath or experience weight gain. 30 tablet 0  . levalbuterol (XOPENEX HFA) 45 MCG/ACT inhaler Inhale 2 puffs into the lungs every 4 (four) hours as needed for wheezing. 1 Inhaler 2  . lisinopril (PRINIVIL,ZESTRIL) 5 MG tablet Take 1 tablet (5 mg total) by mouth daily. 30 tablet 11  . metoprolol succinate (TOPROL-XL) 50 MG 24 hr tablet Take 1 tablet (50 mg total) by mouth daily. Take with or immediately following a meal. 30 tablet 11  . Multiple Vitamin (MULTIVITAMIN WITH MINERALS) TABS tablet Take 1 tablet by mouth daily. 30 tablet 0  . rivaroxaban (XARELTO) 20 MG TABS tablet Take 1 tablet (20 mg total) by mouth daily with supper. 30 tablet 6  . thiamine 100 MG tablet Take 1 tablet (100 mg total) by mouth daily. 30 tablet 0   No current facility-administered medications for this visit.     Allergies:   Cialis [tadalafil]; Levitra [vardenafil]; and Viagra [sildenafil citrate]    Social History:  The patient  reports that he quit smoking about 2 months ago. His smoking use included Cigarettes. He has a 40.00 pack-year smoking history. He has never used smokeless tobacco. He reports that he drinks about 12.6 oz of alcohol per week . He reports that he does not use drugs.   Family History:  The patient's family history is not on file. He was adopted.    ROS:  Please see the history of present illness.   Otherwise, review of systems are positive for none.   All other systems are reviewed and negative.    PHYSICAL EXAM: VS:  BP 90/64   Pulse 61   Ht 6\' 2"  (1.88 m)   Wt 153 lb (69.4 kg)   SpO2 98%   BMI 19.64 kg/m  , BMI Body mass index is 19.64 kg/m.   GEN: Well nourished, well developed, in no acute distress  HEENT: normal except for hyperpigmented nose Neck: no JVD, carotid bruits, or masses Cardiac: regular rate and rhythm; no  murmurs, rubs, or gallops,no edema  Respiratory: Poor air movement bilaterally, no active wheezing. COPD like changes. GI: soft, nontender, nondistended, + BS MS: no deformity or atrophy  Skin: warm and dry, no rash Neuro:  Strength and sensation are intact Psych: euthymic mood, full affect  EKG:   Today 03/30/15-sinus rhythm, 71, small Q waves inferiorly, otherwise rightward axis personally viewed-no significant change from prior.   Recent Labs: 03/04/2016: B Natriuretic Peptide 322.6 03/06/2016: Magnesium 1.7 03/07/2016: BUN 16; Creatinine, Ser 1.27; Hemoglobin 16.7; Platelets 201; Potassium 4.3; Sodium 135 03/12/2016: ALT 58    Lipid Panel    Component Value Date/Time   CHOL 125 02/22/2016 1008   TRIG 74 02/22/2016 1008  HDL 52 02/22/2016 1008   CHOLHDL 2.4 02/22/2016 1008   VLDL 15 02/22/2016 1008   LDLCALC 58 02/22/2016 1008      Wt Readings from Last 3 Encounters:  05/30/16 153 lb (69.4 kg)  05/06/16 151 lb (68.5 kg)  04/18/16 145 lb (65.8 kg)      Other studies Reviewed: Additional studies/ records that were reviewed today include:  Prior labs, echocardiogram, office notes, carotid Doppler. Review of the above records demonstrates:  As above  Holter 04/21/16:  Occasional PVCs, ventricular bigeminy which do correlate with palpitations, flutter sensation.  No atrial fibrillation, no significant pauses  Overall reassuring monitor  ECHO: 03/12/16: - Left ventricle: The cavity size was normal. Wall thickness was   normal. The estimated ejection fraction was 44%. Diffuse   hypokinesis. The study is not technically sufficient to allow   evaluation of LV diastolic function. - Aortic valve: Sclerosis without stenosis. There was no   regurgitation. - Mitral valve: Mildly thickened leaflets . There was moderate   regurgitation. - Left atrium: The atrium was normal in size. - Right ventricle: The cavity size was moderately dilated. Systolic   function is mildly  reduced. - Atrial septum: No defect or patent foramen ovale was identified. - Systemic veins: The IVC measures <2.1 cm, but does not collapse   >50%, suggesting an elevated RA pressure of 8 mmHg.  Impressions:  - Compared to a prior echo on 03/05/2016, the LVEF has improved   from 20-25% up to 44%.   ASSESSMENT AND PLAN:  Chronic systolic heart failure  - Possible alcohol etiology, tachycardia etiology  - Decreasing lisinopril to 5 mg because of dizziness, low blood pressure  - Continue with Toprol  - EF originally 25% now 44% after reestablishing sinus rhythm.  - Lasix as needed  Atrial flutter  - post ablation 8/12, Dr. Rayann Heman  - had DCCV TEE 03/06/16 amiodarone. Brief episode 12/24 then NSR since.   - Overall his Holter monitor did not show any evidence of atrial flutter or fibrillation. Reassuring.  Atrial fibrillation  - Feels better in NSR.   - CHADS-VASc (HTN, TIA) - 3  - Anticoagulated previously with Pradaxa but missed several second daily doses. He is now on Xarelto. Stroke protection. We discussed the importance of taking this.  - Complete metabolic profile and CBC every 6 months as well as TSH given his amiodarone use and Xarelto.  - Ejection fraction normal-11/24/13, however decreased to 20-25% prior to cardioversion on TEE 02/2016. Repeat echo with NSR EF 44%. His EF remains mildly reduced.    Chronic anticoagulation  - Pradaxa changed to Xarelto because of compliance issues.  - No bleeding noted. Unstable  Coronary artery disease  - Nuclear stress test stable on 11/25/13. old inferior scar noted - catheterization July 06, 2010, chronic total occlusion distal RCA with collaterals from right and left side. Medical therapy recommended, mild decreased LV function  - Continue with secondary prevention  Carotid artery disease Heterogeneous plaque, bilaterally. 1-39% bilateral ICA stenosis. >50% RECA stenosis. Normal subclavian arteries, bilaterally. Patent  vertebral arteries with antegrade flow. He does not need repeat Dopplers.  Hyperlipidemia  - Atorvastatin 40  - LDL in the 70s. Excellent.   Elevtated LFTs  - Mildly elevated at previous check. We are checking again today.  - Avoid alcohol  Tobacco use  - For the most part, he has quit. He does have his moments of relapse. Emphysema noted.  COPD  - He has been seeing  Dr. [place] Bells. Inhalers.  - In part, his shortness of breath on hills is from poor air movement.  High-risk medication use  -  on amiodarone, continue.   He may return to work 3 days a week for 9 hours a day without restrictions. We have filled out a form for him and sent it.   Disposition:  Elzena Muston 6 months   Signed, Candee Furbish, MD  05/30/2016 9:09 AM    [facility] [place], [place],   [address] Phone: [phone]; Fax: [phone]

## 2016-05-30 NOTE — Patient Instructions (Addendum)
Medication Instructions:  The current medical regimen is effective;  continue present plan and medications.  Lab: Please have blood work today (CMP, CBC, TSH and Free T4).  Follow-Up: Follow up in 6 months with Dr. [name].  You will receive a letter in the mail 2 months before you are due.  Please call us when you receive this letter to schedule your follow up appointment.  If you need a refill on your cardiac medications before your next appointment, please call your pharmacy.  Thank you for choosing [facility]!!

## 2016-05-31 LAB — COMPREHENSIVE METABOLIC PANEL
A/G RATIO: 1.8 (ref 1.2–2.2)
ALT: 34 IU/L (ref 0–44)
AST: 33 IU/L (ref 0–40)
Albumin: 4.4 g/dL (ref 3.6–4.8)
Alkaline Phosphatase: 81 IU/L (ref 39–117)
BILIRUBIN TOTAL: 0.7 mg/dL (ref 0.0–1.2)
BUN / CREAT RATIO: 19 (ref 10–24)
BUN: 21 mg/dL (ref 8–27)
CALCIUM: 9.5 mg/dL (ref 8.6–10.2)
CO2: 25 mmol/L (ref 18–29)
Chloride: 98 mmol/L (ref 96–106)
Creatinine, Ser: 1.13 mg/dL (ref 0.76–1.27)
GFR calc non Af Amer: 68 mL/min/{1.73_m2} (ref >59)
GFR, EST AFRICAN AMERICAN: 79 mL/min/{1.73_m2} (ref >59)
GLUCOSE: 67 mg/dL (ref 65–99)
Globulin, Total: 2.5 g/dL (ref 1.5–4.5)
POTASSIUM: 4.7 mmol/L (ref 3.5–5.2)
Sodium: 138 mmol/L (ref 134–144)
Total Protein: 6.9 g/dL (ref 6.0–8.5)

## 2016-05-31 LAB — CBC
HEMOGLOBIN: 14.2 g/dL (ref 13.0–17.7)
Hematocrit: 42.2 % (ref 37.5–51.0)
MCH: 31.9 pg (ref 26.6–33.0)
MCHC: 33.6 g/dL (ref 31.5–35.7)
MCV: 95 fL (ref 79–97)
PLATELETS: 251 10*3/uL (ref 150–379)
RBC: 4.45 x10E6/uL (ref 4.14–5.80)
RDW: 15.2 % (ref 12.3–15.4)
WBC: 8.5 10*3/uL (ref 3.4–10.8)

## 2016-05-31 LAB — TSH: TSH: 3.83 u[IU]/mL (ref 0.450–4.500)

## 2016-05-31 LAB — T4, FREE: FREE T4: 1.58 ng/dL (ref 0.82–1.77)

## 2016-06-03 ENCOUNTER — Other Ambulatory Visit: Payer: Self-pay | Admitting: Cardiology

## 2016-06-16 ENCOUNTER — Other Ambulatory Visit: Payer: Self-pay | Admitting: Cardiology

## 2016-06-16 ENCOUNTER — Other Ambulatory Visit: Payer: 59

## 2016-06-17 NOTE — Telephone Encounter (Signed)
Medication Detail    Disp Refills Start End   amiodarone (PACERONE) 200 MG tablet 30 tablet 11 05/30/2016    Sig - Route: Take 1 tablet (200 mg total) by mouth daily. - Oral   E-Prescribing Status: Receipt confirmed by pharmacy (05/30/2016 9:00 AM EDT)   Pharmacy   [id number] - [place], [place] - [address] AT [place]

## 2016-06-20 ENCOUNTER — Telehealth: Payer: Self-pay | Admitting: Cardiology

## 2016-06-20 NOTE — Telephone Encounter (Signed)
New message   Pt is calling to find out if Dr. [name] would put him on a 4 day work week. He said right now he is on 3 days.

## 2016-06-20 NOTE — Telephone Encounter (Signed)
Spoke with pt and he is asking for Dr. [name] would approve for him to return to work 4 days a week, instead of 3.  Per OV note on 06/02/16 pt was released to work 3 days a week for 9 hrs/day with no restrictions.  Pt denies CP, SOB or other cardiac issues.  Pt states he has been doing great working 3 days a week and feels he would be ok increasing to 4 if Dr. [name] is agreeable.  Pt just needs a letter stating this if he is able to. Did not have BP readings available but states BP has been fine.  Advised I would send message to Dr. [name] for review and advisement but he would not be back in the office until next week to sign letter.  Pt verbalized understanding and was appreciative for assistance.

## 2016-06-20 NOTE — Telephone Encounter (Signed)
I'm fine with 4 days a week  Pam, would you please help with letter. Thanks.   Candee Furbish, MD

## 2016-06-23 ENCOUNTER — Encounter: Payer: Self-pay | Admitting: *Deleted

## 2016-06-23 NOTE — Telephone Encounter (Signed)
Signed and taken to MR to be faxed.

## 2016-06-23 NOTE — Telephone Encounter (Signed)
Letter completed and will be faxed as requested

## 2016-06-23 NOTE — Telephone Encounter (Signed)
New message      Calling to get a note for pt to return to work an please list any restrictions.  Please fax it to [phone] [facility].

## 2016-06-25 ENCOUNTER — Telehealth: Payer: Self-pay | Admitting: Cardiology

## 2016-06-25 NOTE — Telephone Encounter (Signed)
Spoke with [name] in MR.  She will refax letter to [facility] as requested.

## 2016-06-25 NOTE — Telephone Encounter (Signed)
[name] from pt's job,say they are still waiting for pt's letter to return to work.

## 2016-07-03 ENCOUNTER — Other Ambulatory Visit: Payer: Self-pay | Admitting: Cardiology

## 2016-08-04 ENCOUNTER — Ambulatory Visit: Payer: 59 | Admitting: Pulmonary Disease

## 2016-09-05 ENCOUNTER — Ambulatory Visit (INDEPENDENT_AMBULATORY_CARE_PROVIDER_SITE_OTHER): Payer: 59 | Admitting: Pulmonary Disease

## 2016-09-05 ENCOUNTER — Encounter: Payer: Self-pay | Admitting: Pulmonary Disease

## 2016-09-05 VITALS — BP 128/72 | HR 62 | Ht 74.0 in | Wt 157.8 lb

## 2016-09-05 DIAGNOSIS — J432 Centrilobular emphysema: Secondary | ICD-10-CM

## 2016-09-05 NOTE — Patient Instructions (Addendum)
For your COPD: Stay active, exercises regularly Practice good hand hygiene to prevent infection Get a flu shot in the fall Use xopenex as needed for chest tightness wheezing or shortness of breath Let us know if you develop shortness of breath cough or wheezing and we would be happy to see you back. Otherwise, we'll see you back on an as-needed basis

## 2016-09-05 NOTE — Progress Notes (Signed)
Subjective:    Patient ID: [name], male    DOB: 1950-11-23, 66 y.o.   MRN: [id number]  Synopsis: Referred in February 2018 for centrilobular emphysema. Smoked 2 packs of cigarettes daily for 42 years, quit smoking in December 2017. He has significant atrial fibrillation.  HPI Chief Complaint  Patient presents with  . Follow-up    pt states he is doing well, denies any breathing complaints at this time.    [name] says he is doing OK.  Really no breahting problems.  He hasn't touched an inhaler since the last visit.  No wheezing, no chest tightness.  He will cough up mucus which is related to allergic rhinitis.  No bronchitis.   Past Medical History:  Diagnosis Date  . Adenomatous colon polyp   . Alcohol use   . Antiphospholipid antibody positive    ???In the past???but not proven according to patient  . Antiphospholipid antibody positive    ??? in the past, but not proven according to the patient.  . Atrial flutter ([place])    atrial flutter ablation,   Dr.Allred October 18, 2010  . BPH (benign prostatic hypertrophy)   . CAD (coronary artery disease)    Catheterization 2005 disease / catheterization July 06, 2010, chronic total occlusion distal RCA with collaterals from right and left side.  Medical therapy recommended, mild decreased LV function       . Carotid artery disease (HCC)    1-85% R. ICA, 63-14% LICA... Doppler.... January, 2010  /  Doppler... in January, 2011 no change  /  doppler1/27/2012...stable  9-70% RICA 26-37% LICA  . CHF (congestive heart failure) ([place])   . CRAO (central retinal artery occlusion)   . Ejection fraction     EF 50-55%, echo, March, 2012... inferobasal and distal septal hypokinesis /      60%, echo, November, 2008  . GERD (gastroesophageal reflux disease)   . Hypercholesterolemia   . Hypertension   . Myocardial infarction ([place])   . Nonsustained ventricular tachycardia ([place])   . Palpitations    probable SVT, rate 170,, at home,, lasted one  hour,, 2012  . Raynaud's syndrome   . TIA (transient ischemic attack)    Possible small vessel tia in the past.  . Tobacco abuse      [name]        Review of Systems  Constitutional: Negative for fever and unexpected weight change.  HENT: Negative for congestion, dental problem, ear pain, nosebleeds, postnasal drip, rhinorrhea, sinus pressure, sneezing, sore throat and trouble swallowing.   Eyes: Negative for redness and itching.  Respiratory: Negative for cough, chest tightness, shortness of breath and wheezing.   Cardiovascular: Negative for palpitations and leg swelling.  Gastrointestinal: Negative for nausea and vomiting.  Genitourinary: Negative for dysuria.  Musculoskeletal: Negative for joint swelling.  Skin: Negative for rash.  Neurological: Negative for headaches.  Hematological: Does not bruise/bleed easily.  Psychiatric/Behavioral: Negative for dysphoric mood. The patient is not nervous/anxious.        Objective:   Physical Exam Vitals:   09/05/16 0908  BP: 128/72  Pulse: 62  SpO2: 100%  Weight: 157 lb 12.8 oz (71.6 kg)  Height: 6\' 2"  (1.88 m)   Walked 500 feet on room air and his O2 saturation remained above 98%  Gen: well appearing HENT: OP clear, TM's clear, neck supple PULM: CTA B, normal percussion CV: RRR, no mgr, trace edema GI:  BS+, soft, nontender Derm: no cyanosis or rash Psyche: normal mood and affect    Imaging: December 2017 chest x-ray images personally reviewed showing significant emphysema with diaphragm flattening  PFT: February 2018 spirometry: Ratio 54%, FEV1 1.67 L 42% predicted  Records reviewed from his January 2018 visit with his primary care physician who cared for him for hypertension with lisinopril and counseled him on the importance of staying away from cigarettes.     Assessment & Plan:  Centrilobular emphysema ([place])  Discussion: This has been a stable  interval for [name]. He has not had an exacerbation of his COPD and he has no symptoms. Though he has severe airflow obstruction he is asymptomatic. He has not needed to use his rescue inhaler since he saw [place] the last time. Based on his disease stability he can follow-up with [place] on an as-needed basis.  Plan: For your COPD: Stay active, exercises regularly Practice good hand hygiene to prevent infection Get a flu shot in the fall Use levo-albuterol as needed for chest tightness wheezing or shortness of breath Let us know if you develop shortness of breath cough or wheezing and we would be happy to see you back. Otherwise, we'll see you back on an as-needed basis  Current Outpatient Prescriptions:  .  amiodarone (PACERONE) 200 MG tablet, Take 1 tablet (200 mg total) by mouth daily., Disp: 30 tablet, Rfl: 11 .  atorvastatin (LIPITOR) 40 MG tablet, TAKE 1 TABLET BY MOUTH EVERY DAY, Disp: 30 tablet, Rfl: 11 .  calcium carbonate (TUMS - DOSED IN MG ELEMENTAL CALCIUM) 500 MG chewable tablet, Chew 1 tablet by mouth as needed for heartburn. , Disp: , Rfl:  .  dextromethorphan-guaiFENesin (MUCINEX DM) 30-600 MG 12hr tablet, Take 1 tablet by mouth 2 (two) times daily as needed for cough., Disp: , Rfl:  .  folic acid (FOLVITE) 1 MG tablet, TAKE 1 TABLET(1 MG) BY MOUTH DAILY., Disp: 30 tablet, Rfl: 11 .  furosemide (LASIX) 20 MG tablet, Take 1 tablet (20 mg total) by mouth daily as needed for fluid or edema. Use if you become short of breath or experience weight gain., Disp: 30 tablet, Rfl: 0 .  levalbuterol (XOPENEX HFA) 45 MCG/ACT inhaler, Inhale 2 puffs into the lungs every 4 (four) hours as needed for wheezing., Disp: 1 Inhaler, Rfl: 2 .  lisinopril (PRINIVIL,ZESTRIL) 5 MG tablet, Take 1 tablet (5 mg total) by mouth daily., Disp: 30 tablet, Rfl: 11 .  metoprolol succinate (TOPROL-XL) 50 MG 24 hr tablet, Take 1 tablet (50 mg total) by mouth daily. Take with or immediately following a meal., Disp: 30  tablet, Rfl: 11 .  Multiple Vitamin (MULTIVITAMIN WITH MINERALS) TABS tablet, Take 1 tablet by mouth daily., Disp: 30 tablet, Rfl: 0 .  rivaroxaban (XARELTO) 20 MG TABS tablet, Take 1 tablet (20 mg total) by mouth daily with supper., Disp: 30 tablet, Rfl: 6 .  thiamine 100 MG tablet, Take 1 tablet (100 mg total) by mouth daily., Disp: 30 tablet, Rfl: 0

## 2016-09-15 ENCOUNTER — Other Ambulatory Visit: Payer: Self-pay | Admitting: Cardiology

## 2017-01-09 ENCOUNTER — Encounter: Payer: Self-pay | Admitting: Cardiology

## 2017-01-09 ENCOUNTER — Ambulatory Visit (INDEPENDENT_AMBULATORY_CARE_PROVIDER_SITE_OTHER): Payer: 59 | Admitting: Cardiology

## 2017-01-09 VITALS — BP 142/70 | HR 58 | Ht 74.0 in | Wt 151.8 lb

## 2017-01-09 DIAGNOSIS — Z79899 Other long term (current) drug therapy: Secondary | ICD-10-CM | POA: Diagnosis not present

## 2017-01-09 DIAGNOSIS — I5022 Chronic systolic (congestive) heart failure: Secondary | ICD-10-CM

## 2017-01-09 DIAGNOSIS — I480 Paroxysmal atrial fibrillation: Secondary | ICD-10-CM | POA: Diagnosis not present

## 2017-01-09 DIAGNOSIS — I10 Essential (primary) hypertension: Secondary | ICD-10-CM

## 2017-01-09 LAB — CBC
HEMATOCRIT: 42.7 % (ref 37.5–51.0)
HEMOGLOBIN: 15 g/dL (ref 13.0–17.7)
MCH: 33.9 pg — ABNORMAL HIGH (ref 26.6–33.0)
MCHC: 35.1 g/dL (ref 31.5–35.7)
MCV: 97 fL (ref 79–97)
Platelets: 244 10*3/uL (ref 150–379)
RBC: 4.42 x10E6/uL (ref 4.14–5.80)
RDW: 13.5 % (ref 12.3–15.4)
WBC: 8.2 10*3/uL (ref 3.4–10.8)

## 2017-01-09 LAB — BASIC METABOLIC PANEL
BUN/Creatinine Ratio: 13 (ref 10–24)
BUN: 16 mg/dL (ref 8–27)
CALCIUM: 9.6 mg/dL (ref 8.6–10.2)
CO2: 24 mmol/L (ref 20–29)
Chloride: 98 mmol/L (ref 96–106)
Creatinine, Ser: 1.2 mg/dL (ref 0.76–1.27)
GFR calc Af Amer: 73 mL/min/{1.73_m2} (ref >59)
GFR, EST NON AFRICAN AMERICAN: 63 mL/min/{1.73_m2} (ref >59)
GLUCOSE: 68 mg/dL (ref 65–99)
POTASSIUM: 4.7 mmol/L (ref 3.5–5.2)
Sodium: 136 mmol/L (ref 134–144)

## 2017-01-09 LAB — TSH: TSH: 3.93 u[IU]/mL (ref 0.450–4.500)

## 2017-01-09 LAB — T4, FREE: FREE T4: 1.74 ng/dL (ref 0.82–1.77)

## 2017-01-09 NOTE — Patient Instructions (Signed)
Medication Instructions:  The current medical regimen is effective;  continue present plan and medications.  Labwork: Please have blood work today (CBC, BMP, TSH and Free T4)  Follow-Up: Follow up in 6 months with Cecilie Kicks, NP.  You will receive a letter in the mail 2 months before you are due.  Please call us when you receive this letter to schedule your follow up appointment.  Follow up in 1 year with Dr. [name].  You will receive a letter in the mail 2 months before you are due.  Please call us when you receive this letter to schedule your follow up appointment.  If you need a refill on your cardiac medications before your next appointment, please call your pharmacy.  Thank you for choosing [facility]!!

## 2017-01-09 NOTE — Progress Notes (Signed)
Cardiology Office Note   Date:  01/09/2017   ID:  [name], DOB Nov 25, 1950, MRN [id number]  PCP:  Hoyt Koch, MD  Cardiologist:   Candee Furbish, MD (Former Ron Parker)      History of Present Illness: [name] is a 66 y.o. male here for follow-up of atrial fibrillation/flutter, dilated cardiomyopathy. He had TEE cardioversion on 03/06/16 with amiodarone 200 mg twice a day for 2 weeks then daily and then had one episode of atrial fibrillation on 03/09/2016.  Overall he seems to be maintaining sinus rhythm well at this point.  Ejection fraction improved from 25% up to 45%.  Overall is been challenging to increase his lisinopril or other traditional heart failure medications because of hypotension.  He is quite thin.  Sometimes may feel some dizziness when standing up.  Flutter ablation 10/18/10.   Prior question history of antiphospholipid antibody syndrome but this is not proven according to the patient.  Reviewed pulmonary notes.   Past Medical History:  Diagnosis Date  . Adenomatous colon polyp   . Alcohol use   . Antiphospholipid antibody positive    ???In the past???but not proven according to patient  . Antiphospholipid antibody positive    ??? in the past, but not proven according to the patient.  . Atrial flutter ([place])    atrial flutter ablation,   Dr.Allred October 18, 2010  . BPH (benign prostatic hypertrophy)   . CAD (coronary artery disease)    Catheterization 2005 disease / catheterization July 06, 2010, chronic total occlusion distal RCA with collaterals from right and left side.  Medical therapy recommended, mild decreased LV function       . Carotid artery disease (HCC)    4-09% R. ICA, 81-19% LICA... Doppler.... January, 2010  /  Doppler... in January, 2011 no change  /  doppler1/27/2012...stable  1-47% RICA 82-95% LICA  . CHF (congestive heart failure) ([place])   . CRAO (central retinal artery occlusion)   . Ejection fraction     EF 50-55%,  echo, March, 2012... inferobasal and distal septal hypokinesis /      60%, echo, November, 2008  . GERD (gastroesophageal reflux disease)   . Hypercholesterolemia   . Hypertension   . Myocardial infarction ([place])   . Nonsustained ventricular tachycardia ([place])   . Palpitations    probable SVT, rate 170,, at home,, lasted one hour,, 2012  . Raynaud's syndrome   . TIA (transient ischemic attack)    Possible small vessel tia in the past.  . Tobacco abuse     Past Surgical History:  Procedure Laterality Date  . CARDIAC CATHETERIZATION  06/27/2003   2005.... mild irregularity of the LAD..( patient had had abnormal Myoview scan)  . CARDIOVERSION N/A 03/06/2016   Procedure: CARDIOVERSION;  Surgeon: Josue Hector, MD;  Location: [facility] ENDOSCOPY;  Service: Cardiovascular;  Laterality: N/A;  . INGUINAL HERNIA REPAIR  2009   right with mesh.  Dr Marlou Starks  . TEE WITHOUT CARDIOVERSION N/A 03/06/2016   Procedure: TRANSESOPHAGEAL ECHOCARDIOGRAM (TEE);  Surgeon: Josue Hector, MD;  Location: [facility] ENDOSCOPY;  Service: Cardiovascular;  Laterality: N/A;  . THYROGLOSSAL DUCT CYST  2006   Dr. Wilburn Cornelia     Current Outpatient Prescriptions  Medication Sig Dispense Refill  . amiodarone (PACERONE) 200 MG tablet Take 1 tablet (200 mg total) by mouth daily. 30 tablet 11  . atorvastatin (LIPITOR) 40 MG tablet TAKE 1 TABLET BY MOUTH EVERY DAY 30 tablet 11  .  calcium carbonate (TUMS - DOSED IN MG ELEMENTAL CALCIUM) 500 MG chewable tablet Chew 1 tablet by mouth daily as needed for heartburn.     . dextromethorphan-guaiFENesin (MUCINEX DM) 30-600 MG 12hr tablet Take 1 tablet by mouth 2 (two) times daily as needed for cough.    . folic acid (FOLVITE) 1 MG tablet TAKE 1 TABLET(1 MG) BY MOUTH DAILY. 30 tablet 11  . furosemide (LASIX) 20 MG tablet Take 1 tablet (20 mg total) by mouth daily as needed for fluid or edema. Use if you become short of breath or experience weight gain. 30 tablet 0  . levalbuterol (XOPENEX HFA) 45  MCG/ACT inhaler Inhale 2 puffs into the lungs every 4 (four) hours as needed for wheezing. 1 Inhaler 2  . lisinopril (PRINIVIL,ZESTRIL) 5 MG tablet Take 1 tablet (5 mg total) by mouth daily. 30 tablet 11  . metoprolol succinate (TOPROL-XL) 50 MG 24 hr tablet Take 1 tablet (50 mg total) by mouth daily. Take with or immediately following a meal. 30 tablet 11  . Multiple Vitamin (MULTIVITAMIN WITH MINERALS) TABS tablet Take 1 tablet by mouth daily. 30 tablet 0  . thiamine 100 MG tablet Take 1 tablet (100 mg total) by mouth daily. 30 tablet 0  . XARELTO 20 MG TABS tablet TAKE 1 TABLET(20 MG) BY MOUTH DAILY WITH SUPPER 30 tablet 5   No current facility-administered medications for this visit.     Allergies:   Cialis [tadalafil]; Levitra [vardenafil]; and Viagra [sildenafil citrate]    Social History:  The patient  reports that he quit smoking about 10 months ago. His smoking use included Cigarettes. He has a 40.00 pack-year smoking history. He has never used smokeless tobacco. He reports that he drinks about 12.6 oz of alcohol per week . He reports that he does not use drugs.   Family History:  The patient's family history is not on file. He was adopted.    ROS:  Please see the history of present illness.   Otherwise, review of systems are positive for none.   All other systems are reviewed and negative.    PHYSICAL EXAM: VS:  BP (!) 142/70   Pulse (!) 58   Ht 6\' 2"  (1.88 m)   Wt 151 lb 12.8 oz (68.9 kg)   BMI 19.49 kg/m  , BMI Body mass index is 19.49 kg/m.   GEN: Well nourished, well developed, in no acute distress  HEENT: normal, hyperpigmented nose Neck: no JVD, carotid bruits, or masses Cardiac: RRR; no murmurs, rubs, or gallops,no edema  Respiratory:  clear to auscultation bilaterally, normal work of breathing, somewhat poor air movement bilaterally GI: soft, nontender, nondistended, + BS MS: no deformity or atrophy  Skin: warm and dry, no rash Neuro:  Alert and Oriented x 3,  Strength and sensation are intact Psych: euthymic mood, full affect   EKG:   Today 03/30/15-sinus rhythm, 71, small Q waves inferiorly, otherwise rightward axis personally viewed-no significant change from prior.   Recent Labs: 03/04/2016: B Natriuretic Peptide 322.6 03/06/2016: Magnesium 1.7 05/30/2016: ALT 34; BUN 21; Creatinine, Ser 1.13; Hemoglobin 14.2; Platelets 251; Potassium 4.7; Sodium 138; TSH 3.830    Lipid Panel    Component Value Date/Time   CHOL 125 02/22/2016 1008   TRIG 74 02/22/2016 1008   HDL 52 02/22/2016 1008   CHOLHDL 2.4 02/22/2016 1008   VLDL 15 02/22/2016 1008   LDLCALC 58 02/22/2016 1008      Wt Readings from Last 3 Encounters:  01/09/17 151 lb 12.8 oz (68.9 kg)  09/05/16 157 lb 12.8 oz (71.6 kg)  05/30/16 153 lb (69.4 kg)      Other studies Reviewed: Additional studies/ records that were reviewed today include: Denies any fevers chills nausea vomiting syncope bleeding review of the above records demonstrates:  As above  Holter 04/21/16:  Occasional PVCs, ventricular bigeminy which do correlate with palpitations, flutter sensation.  No atrial fibrillation, no significant pauses  Overall reassuring monitor  ECHO: 03/12/16: - Left ventricle: The cavity size was normal. Wall thickness was   normal. The estimated ejection fraction was 44%. Diffuse   hypokinesis. The study is not technically sufficient to allow   evaluation of LV diastolic function. - Aortic valve: Sclerosis without stenosis. There was no   regurgitation. - Mitral valve: Mildly thickened leaflets . There was moderate   regurgitation. - Left atrium: The atrium was normal in size. - Right ventricle: The cavity size was moderately dilated. Systolic   function is mildly reduced. - Atrial septum: No defect or patent foramen ovale was identified. - Systemic veins: The IVC measures <2.1 cm, but does not collapse   >50%, suggesting an elevated RA pressure of 8  mmHg.  Impressions:  - Compared to a prior echo on 03/05/2016, the LVEF has improved   from 20-25% up to 44%.   ASSESSMENT AND PLAN:  Chronic systolic heart failure  - Possible alcohol etiology, tachycardia etiology, it has improved.  - Low-dose lisinopril to 5 mg because of dizziness, low blood pressure in the past  - Continue with Toprol 50  - EF originally 25% now 44% after reestablishing sinus rhythm.  - Lasix as needed  Atrial flutter  - post ablation 8/12, Dr. Rayann Heman  - had DCCV TEE 03/06/16 amiodarone. Brief episode 12/24 then NSR since.   - Overall his Holter monitor did not show any evidence of atrial flutter or fibrillation. Reassuring.  Atrial fibrillation  - Feels better in NSR.  He seems to be continuing in normal sinus rhythm doing well.  - CHADS-VASc (HTN, TIA) - 3  - Anticoagulated previously with Pradaxa but missed several second daily doses. He is now on Xarelto. Stroke protection. We discussed the importance of taking this.  He states that he is compliant with this.  - Complete metabolic profile and CBC every 6 months as well as TSH given his amiodarone use and Xarelto.  Checking today  - Ejection fraction normal-11/24/13, however decreased to 20-25% prior to cardioversion on TEE 02/2016. Repeat echo with NSR EF 44%. His EF remains mildly reduced.   Chronic anticoagulation  - Pradaxa changed to Xarelto because of compliance issues.  - No bleeding noted.  Doing well.  Coronary artery disease  - Nuclear stress test stable on 11/25/13. old inferior scar noted - catheterization July 06, 2010, chronic total occlusion distal RCA with collaterals from right and left side. Medical therapy recommended, mild decreased LV function  - Continue with secondary prevention, no anginal symptoms.  Carotid artery disease Heterogeneous plaque, bilaterally. 1-39% bilateral ICA stenosis. >50% RECA stenosis. Normal subclavian arteries, bilaterally. Patent vertebral arteries  with antegrade flow. He does not need repeat Dopplers.  No changes  Hyperlipidemia  - Atorvastatin 40  - LDL in the 70s. Excellent.  No changes, no myalgias  Elevtated LFTs  - Mildly elevated at previous check. We are checking again today.  - Avoid alcohol  Tobacco use  - For the most part, he has quit. He does have his moments of  relapse. Emphysema noted.  Still struggling at times.  COPD  - He has been seeing Dr. Lake Bells. Inhalers.  - In part, his shortness of breath on hills is from poor air movement.  High-risk medication use  -  on amiodarone, continue.  We will check a complete metabolic profile, TSH and free T4 as well as CBC.  Disposition:  APP 6 months, 12 with me  Signed, Candee Furbish, MD  01/09/2017 8:25 AM    [address], [place], [place]  [address] Phone: [phone]; Fax: [phone]

## 2017-04-15 ENCOUNTER — Other Ambulatory Visit: Payer: Self-pay | Admitting: Cardiology

## 2017-04-19 ENCOUNTER — Other Ambulatory Visit: Payer: Self-pay | Admitting: Cardiology

## 2017-06-07 ENCOUNTER — Other Ambulatory Visit: Payer: Self-pay | Admitting: Cardiology

## 2017-06-08 NOTE — Telephone Encounter (Signed)
Xarelto 20mg  refill request received; pt is 67 yrs old, wt-68.9kg, Crea-1.20 on 01/09/17, last seen by Dr. [name] on 01/09/17, CrCl-59.81; will send in refill to requested pharmacy.

## 2017-06-11 ENCOUNTER — Other Ambulatory Visit: Payer: Self-pay | Admitting: Cardiology

## 2017-06-11 MED ORDER — METOPROLOL SUCCINATE ER 50 MG PO TB24: 50.0000 mg | ORAL_TABLET | Freq: Every day | ORAL | 1 refills | Status: DC

## 2017-06-11 MED ORDER — AMIODARONE HCL 200 MG PO TABS: 200.0000 mg | ORAL_TABLET | Freq: Every day | ORAL | 1 refills | Status: DC

## 2017-06-11 MED ORDER — ATORVASTATIN CALCIUM 40 MG PO TABS: 40.0000 mg | ORAL_TABLET | Freq: Every day | ORAL | 1 refills | Status: DC

## 2017-06-11 MED ORDER — RIVAROXABAN 20 MG PO TABS: ORAL_TABLET | ORAL | 2 refills | Status: DC

## 2017-06-11 MED ORDER — LISINOPRIL 5 MG PO TABS: ORAL_TABLET | ORAL | 1 refills | Status: DC

## 2017-06-11 MED ORDER — FOLIC ACID 1 MG PO TABS: ORAL_TABLET | ORAL | 1 refills | Status: DC

## 2017-06-11 NOTE — Telephone Encounter (Signed)
Pt's pharmacy [facility] is requesting that pt's Xarelto be resent to their pharmacy. Please address

## 2017-06-11 NOTE — Telephone Encounter (Signed)
Xarelto 20mg  refill requested; pt is 67yrs old, Wt-68.9kg, Crea-1.20, last seen by Dr. [name] on 01/09/17; will resend to Mail Order as requested, CrCl-41ml/min.

## 2017-06-16 ENCOUNTER — Other Ambulatory Visit: Payer: Self-pay | Admitting: Cardiology

## 2017-06-17 ENCOUNTER — Other Ambulatory Visit: Payer: Self-pay | Admitting: Cardiology

## 2017-06-17 ENCOUNTER — Other Ambulatory Visit: Payer: Self-pay | Admitting: *Deleted

## 2017-06-17 MED ORDER — FOLIC ACID 1 MG PO TABS: ORAL_TABLET | ORAL | 0 refills | Status: DC

## 2017-06-17 NOTE — Telephone Encounter (Signed)
Okay to refill? Please advise. Thanks, MI 

## 2017-06-17 NOTE — Telephone Encounter (Signed)
Spoke with patient as we received an rx request from [facility] for the folic acid.  This was sent to his mail order pharmacy but the patient states that he is out. He is aware that I will send a short term rx to his local pharmacy. He stated that the mail order has received the order and they are processing it.

## 2017-07-01 NOTE — Progress Notes (Signed)
Cardiology Office Note   Date:  07/06/2017   ID:  [name], DOB 29-Sep-1950, MRN [id number]  PCP:  Hoyt Koch, MD  Cardiologist:  Dr. [name]     Chief Complaint  Patient presents with  . Coronary Artery Disease      History of Present Illness: [name] is a 67 y.o. male who presents for PAF cardiomyopathy, CAD.    He has a hx of atrial fibrillation/flutter, dilated cardiomyopathy. He had TEE cardioversion on 03/06/16 with amiodarone 200 mg twice a day for 2 weeks then daily and then had one episode of atrial fibrillation on 03/09/2016.  Overall he seems to be maintaining sinus rhythm well at this point.  + tobacco use.   Ejection fraction improved from 25% up to 45%.    He had catheterization July 06, 2010, chronic total occlusion distal RCA with collaterals from right and left side. Medical therapy recommended, mild decreased LV function    Overall is been challenging to increase his lisinopril or other traditional heart failure medications because of hypotension.  He is quite thin.  Sometimes may feel some dizziness when standing up. Flutter ablation 10/18/10.   Prior question history of antiphospholipid antibody syndrome but this is not proven according to the patient.  Last visit 01/09/17 lasix prn, and HF with decreased EF though improved to 444% after DCCV for a fib and ablation in past for a flutter.  He is on xarelto.   Today maintaining SR.  One episode with rushing he became sweaty and HR was 48, did feel better after 30 min.  No chest pain.  Has some DOE but not severe.  He is traveling some and enjoying this.  His BP is up some but he tells me it varies at times and looking back it is up and down.  He is active outside walking, landscaping and tries to eat healthy.  His PCP follows his statins.           Past Medical History:  Diagnosis Date  . Adenomatous colon polyp   . Alcohol use   . Antiphospholipid antibody positive    ???In the  past???but not proven according to patient  . Antiphospholipid antibody positive    ??? in the past, but not proven according to the patient.  . Atrial flutter ([name])    atrial flutter ablation,   Dr.Allred October 18, 2010  . BPH (benign prostatic hypertrophy)   . CAD (coronary artery disease)    Catheterization 2005 disease / catheterization July 06, 2010, chronic total occlusion distal RCA with collaterals from right and left side.  Medical therapy recommended, mild decreased LV function       . Carotid artery disease (HCC)    4-43% R. ICA, 15-40% LICA... Doppler.... January, 2010  /  Doppler... in January, 2011 no change  /  doppler1/27/2012...stable  0-86% RICA 76-19% LICA  . CHF (congestive heart failure) ([place])   . CRAO (central retinal artery occlusion)   . Ejection fraction     EF 50-55%, echo, March, 2012... inferobasal and distal septal hypokinesis /      60%, echo, November, 2008  . GERD (gastroesophageal reflux disease)   . Hypercholesterolemia   . Hypertension   . Myocardial infarction ([place])   . Nonsustained ventricular tachycardia ([place])   . Palpitations    probable SVT, rate 170,, at home,, lasted one hour,, 2012  . Raynaud's syndrome   . TIA (transient ischemic attack)  Possible small vessel tia in the past.  . Tobacco abuse     Past Surgical History:  Procedure Laterality Date  . CARDIAC CATHETERIZATION  06/27/2003   2005.... mild irregularity of the LAD..( patient had had abnormal Myoview scan)  . CARDIOVERSION N/A 03/06/2016   Procedure: CARDIOVERSION;  Surgeon: Josue Hector, MD;  Location: [facility] ENDOSCOPY;  Service: Cardiovascular;  Laterality: N/A;  . INGUINAL HERNIA REPAIR  2009   right with mesh.  Dr Marlou Starks  . TEE WITHOUT CARDIOVERSION N/A 03/06/2016   Procedure: TRANSESOPHAGEAL ECHOCARDIOGRAM (TEE);  Surgeon: Josue Hector, MD;  Location: [facility] ENDOSCOPY;  Service: Cardiovascular;  Laterality: N/A;  . THYROGLOSSAL DUCT CYST  2006   Dr. Wilburn Cornelia     Current  Outpatient Medications  Medication Sig Dispense Refill  . amiodarone (PACERONE) 200 MG tablet Take 1 tablet (200 mg total) by mouth daily. 90 tablet 1  . atorvastatin (LIPITOR) 40 MG tablet Take 1 tablet (40 mg total) by mouth daily. 90 tablet 1  . calcium carbonate (TUMS - DOSED IN MG ELEMENTAL CALCIUM) 500 MG chewable tablet Chew 1 tablet by mouth daily as needed for heartburn.     . dextromethorphan-guaiFENesin (MUCINEX DM) 30-600 MG 12hr tablet Take 1 tablet by mouth 2 (two) times daily as needed for cough.    . folic acid (FOLVITE) 1 MG tablet TAKE 1 TABLET(1 MG) BY MOUTH DAILY. 14 tablet 0  . furosemide (LASIX) 20 MG tablet Take 1 tablet (20 mg total) by mouth daily as needed for fluid or edema. Use if you become short of breath or experience weight gain. 30 tablet 0  . lisinopril (PRINIVIL,ZESTRIL) 5 MG tablet TAKE 1 TABLET(5 MG) BY MOUTH DAILY 90 tablet 1  . metoprolol succinate (TOPROL-XL) 50 MG 24 hr tablet Take 1 tablet (50 mg total) by mouth daily. Take with or immediately following a meal. 90 tablet 1  . Multiple Vitamin (MULTIVITAMIN WITH MINERALS) TABS tablet Take 1 tablet by mouth daily. 30 tablet 0  . rivaroxaban (XARELTO) 20 MG TABS tablet TAKE 1 TABLET(20 MG) BY MOUTH DAILY WITH SUPPER 90 tablet 2  . thiamine 100 MG tablet Take 1 tablet (100 mg total) by mouth daily. 30 tablet 0  . levalbuterol (XOPENEX HFA) 45 MCG/ACT inhaler Inhale 2 puffs into the lungs every 4 (four) hours as needed for wheezing. 1 Inhaler 2   No current facility-administered medications for this visit.     Allergies:   Cialis [tadalafil]; Levitra [vardenafil]; and Viagra [sildenafil citrate]    Social History:  The patient  reports that he quit smoking about 16 months ago. His smoking use included cigarettes. He has a 40.00 pack-year smoking history. He has never used smokeless tobacco. He reports that he drinks about 12.6 oz of alcohol per week. He reports that he does not use drugs.   Family History:   The patient's family history is not on file. He was adopted.    ROS:  General:no colds or fevers, no weight changes Skin:no rashes or ulcers HEENT:no blurred vision, no congestion CV:see HPI PUL:see HPI GI:no diarrhea constipation or melena, no indigestion GU:no hematuria, no dysuria MS:no joint pain, no claudication Neuro:no syncope, no lightheadedness Endo:no diabetes, no thyroid disease  Wt Readings from Last 3 Encounters:  07/06/17 151 lb 9.6 oz (68.8 kg)  01/09/17 151 lb 12.8 oz (68.9 kg)  09/05/16 157 lb 12.8 oz (71.6 kg)     PHYSICAL EXAM: VS:  BP (!) 158/80   Pulse (!) 53  Ht 6\' 2"  (1.88 m)   Wt 151 lb 9.6 oz (68.8 kg)   SpO2 96%   BMI 19.46 kg/m  , BMI Body mass index is 19.46 kg/m. General:Pleasant affect, NAD Skin:Warm and dry, brisk capillary refill HEENT:normocephalic, sclera clear, mucus membranes moist Neck:supple, no JVD, no bruits  Heart:S1S2 RRR without murmur, gallup, rub or click Lungs:clear without rales, rhonchi, or wheezes WGN:FAOZ, non tender, + BS, do not palpate liver spleen or masses Ext:no lower ext edema, 2+ pedal pulses, 2+ radial pulses Neuro:alert and oriented, MAE, follows commands, + facial symmetry    EKG:  EKG is ordered today. The ekg ordered today demonstrates SB at 41, lower than last visit.  No acute changes otherwise.     Recent Labs: 01/09/2017: BUN 16; Creatinine, Ser 1.20; Hemoglobin 15.0; Platelets 244; Potassium 4.7; Sodium 136; TSH 3.930    Lipid Panel    Component Value Date/Time   CHOL 125 02/22/2016 1008   TRIG 74 02/22/2016 1008   HDL 52 02/22/2016 1008   CHOLHDL 2.4 02/22/2016 1008   VLDL 15 02/22/2016 1008   LDLCALC 58 02/22/2016 1008       Other studies Reviewed: Additional studies/ records that were reviewed today include: . Echo 03/12/16 Study Conclusions  - Left ventricle: The cavity size was normal. Wall thickness was   normal. The estimated ejection fraction was 44%. Diffuse    hypokinesis. The study is not technically sufficient to allow   evaluation of LV diastolic function. - Aortic valve: Sclerosis without stenosis. There was no   regurgitation. - Mitral valve: Mildly thickened leaflets . There was moderate   regurgitation. - Left atrium: The atrium was normal in size. - Right ventricle: The cavity size was moderately dilated. Systolic   function is mildly reduced. - Atrial septum: No defect or patent foramen ovale was identified. - Systemic veins: The IVC measures <2.1 cm, but does not collapse   >50%, suggesting an elevated RA pressure of 8 mmHg.  Impressions:  - Compared to a prior echo on 03/05/2016, the LVEF has improved   from 20-25% up to 44%.   ASSESSMENT AND PLAN:  1.  Bradycardia with decrease since last visit and episode of symptomatic bradycardia 3 weeks ago.  Will decrease amiodarone to 100 mg daily and continue BB.   Last DCCV was 02/2016 and he denies any irregular heart beat.  Had a flutter ablation 10/2010.  Follow up with Dr. [name] in 6 months.  Will check labs today on amiodarone.    2.  PAF on xarelto with no bleeding, has not missed any.    3.  Cardiomyopathy with most recent EF now 44% up from 20-25%, euvolemic today   Has not needed lasix in some time.   4.   CAD last cath 2012 with  chronic total occlusion distal RCA with collaterals from right and left side-- medical therapy planned, last nuc 11/2013 with no ischemia   5.   Carotid disease 1-39% stenosis on statin.  6.   Tobacco use, no using now.    7.   HLD stable. On statin followed by PCP    Current medicines are reviewed with the patient today.  The patient Has no concerns regarding medicines.  The following changes have been made:  See above Labs/ tests ordered today include:see above  Disposition:   FU:  see above  Signed, Cecilie Kicks, NP  07/06/2017 8:27 AM    [address] [address],  [place], [place], [place] Phone: [phone]; Fax: [phone]

## 2017-07-06 ENCOUNTER — Ambulatory Visit: Payer: Medicare HMO | Admitting: Cardiology

## 2017-07-06 ENCOUNTER — Encounter: Payer: Self-pay | Admitting: Cardiology

## 2017-07-06 VITALS — BP 158/80 | HR 53 | Ht 74.0 in | Wt 151.6 lb

## 2017-07-06 DIAGNOSIS — I428 Other cardiomyopathies: Secondary | ICD-10-CM | POA: Diagnosis not present

## 2017-07-06 DIAGNOSIS — I480 Paroxysmal atrial fibrillation: Secondary | ICD-10-CM

## 2017-07-06 DIAGNOSIS — E7800 Pure hypercholesterolemia, unspecified: Secondary | ICD-10-CM | POA: Diagnosis not present

## 2017-07-06 DIAGNOSIS — I6523 Occlusion and stenosis of bilateral carotid arteries: Secondary | ICD-10-CM | POA: Diagnosis not present

## 2017-07-06 DIAGNOSIS — R001 Bradycardia, unspecified: Secondary | ICD-10-CM

## 2017-07-06 DIAGNOSIS — Z79899 Other long term (current) drug therapy: Secondary | ICD-10-CM

## 2017-07-06 DIAGNOSIS — I10 Essential (primary) hypertension: Secondary | ICD-10-CM

## 2017-07-06 LAB — TSH: TSH: 3.75 u[IU]/mL (ref 0.450–4.500)

## 2017-07-06 LAB — HEPATIC FUNCTION PANEL
ALBUMIN: 4.6 g/dL (ref 3.6–4.8)
ALT: 20 IU/L (ref 0–44)
AST: 21 IU/L (ref 0–40)
Alkaline Phosphatase: 84 IU/L (ref 39–117)
Bilirubin Total: 0.5 mg/dL (ref 0.0–1.2)
Bilirubin, Direct: 0.17 mg/dL (ref 0.00–0.40)
TOTAL PROTEIN: 6.8 g/dL (ref 6.0–8.5)

## 2017-07-06 MED ORDER — AMIODARONE HCL 200 MG PO TABS: 100.0000 mg | ORAL_TABLET | Freq: Every day | ORAL | 3 refills | Status: DC

## 2017-07-06 NOTE — Patient Instructions (Signed)
Medication Instructions:  Your physician has recommended you make the following change in your medication: 1.  DECREASE the Amiodarone to 100 mg daily (cut the 200 mg tablets in half and call us when you need a refill)   Labwork: TODAY: HEPATIC & TSH  Testing/Procedures: None ordered  Follow-Up: Your physician wants you to follow-up in: [place] will receive a reminder letter in the mail two months in advance. If you don't receive a letter, please call our office to schedule the follow-up appointment.   Any Other Special Instructions Will Be Listed Below (If Applicable).     If you need a refill on your cardiac medications before your next appointment, please call your pharmacy.

## 2017-07-07 NOTE — Progress Notes (Signed)
Pt has been made aware of normal result and verbalized understanding.  jw 07/07/17

## 2017-07-27 ENCOUNTER — Encounter: Payer: Self-pay | Admitting: Internal Medicine

## 2017-07-27 ENCOUNTER — Other Ambulatory Visit (INDEPENDENT_AMBULATORY_CARE_PROVIDER_SITE_OTHER): Payer: Medicare HMO

## 2017-07-27 ENCOUNTER — Ambulatory Visit (INDEPENDENT_AMBULATORY_CARE_PROVIDER_SITE_OTHER): Payer: Medicare HMO | Admitting: Internal Medicine

## 2017-07-27 VITALS — BP 150/80 | HR 66 | Temp 97.7°F | Ht 74.0 in | Wt 149.0 lb

## 2017-07-27 DIAGNOSIS — F17200 Nicotine dependence, unspecified, uncomplicated: Secondary | ICD-10-CM | POA: Diagnosis not present

## 2017-07-27 DIAGNOSIS — Z23 Encounter for immunization: Secondary | ICD-10-CM

## 2017-07-27 DIAGNOSIS — I10 Essential (primary) hypertension: Secondary | ICD-10-CM

## 2017-07-27 DIAGNOSIS — I480 Paroxysmal atrial fibrillation: Secondary | ICD-10-CM | POA: Diagnosis not present

## 2017-07-27 DIAGNOSIS — N401 Enlarged prostate with lower urinary tract symptoms: Secondary | ICD-10-CM

## 2017-07-27 DIAGNOSIS — Z0000 Encounter for general adult medical examination without abnormal findings: Secondary | ICD-10-CM

## 2017-07-27 DIAGNOSIS — F17210 Nicotine dependence, cigarettes, uncomplicated: Secondary | ICD-10-CM | POA: Diagnosis not present

## 2017-07-27 DIAGNOSIS — R351 Nocturia: Secondary | ICD-10-CM

## 2017-07-27 DIAGNOSIS — J432 Centrilobular emphysema: Secondary | ICD-10-CM | POA: Diagnosis not present

## 2017-07-27 DIAGNOSIS — I5022 Chronic systolic (congestive) heart failure: Secondary | ICD-10-CM | POA: Diagnosis not present

## 2017-07-27 DIAGNOSIS — E7800 Pure hypercholesterolemia, unspecified: Secondary | ICD-10-CM | POA: Diagnosis not present

## 2017-07-27 DIAGNOSIS — F1010 Alcohol abuse, uncomplicated: Secondary | ICD-10-CM

## 2017-07-27 LAB — COMPREHENSIVE METABOLIC PANEL
ALBUMIN: 4.6 g/dL (ref 3.5–5.2)
ALK PHOS: 71 U/L (ref 39–117)
ALT: 18 U/L (ref 0–53)
AST: 19 U/L (ref 0–37)
BILIRUBIN TOTAL: 0.7 mg/dL (ref 0.2–1.2)
BUN: 15 mg/dL (ref 6–23)
CALCIUM: 9.7 mg/dL (ref 8.4–10.5)
CO2: 27 mEq/L (ref 19–32)
Chloride: 101 mEq/L (ref 96–112)
Creatinine, Ser: 1.15 mg/dL (ref 0.40–1.50)
GFR: 67.62 mL/min (ref >60.00)
GLUCOSE: 105 mg/dL — AB (ref 70–99)
POTASSIUM: 4.5 meq/L (ref 3.5–5.1)
Sodium: 137 mEq/L (ref 135–145)
TOTAL PROTEIN: 7.1 g/dL (ref 6.0–8.3)

## 2017-07-27 LAB — VITAMIN B12: Vitamin B-12: 548 pg/mL (ref 211–911)

## 2017-07-27 LAB — CBC
HCT: 46.3 % (ref 39.0–52.0)
HEMOGLOBIN: 16 g/dL (ref 13.0–17.0)
MCHC: 34.6 g/dL (ref 30.0–36.0)
MCV: 97.4 fl (ref 78.0–100.0)
PLATELETS: 217 10*3/uL (ref 150.0–400.0)
RBC: 4.75 Mil/uL (ref 4.22–5.81)
RDW: 13.6 % (ref 11.5–15.5)
WBC: 8.7 10*3/uL (ref 4.0–10.5)

## 2017-07-27 LAB — LIPID PANEL
CHOLESTEROL: 137 mg/dL (ref 0–200)
HDL: 73.1 mg/dL (ref >39.00)
LDL Cholesterol: 52 mg/dL (ref 0–99)
NONHDL: 64.21
TRIGLYCERIDES: 61 mg/dL (ref 0.0–149.0)
Total CHOL/HDL Ratio: 2
VLDL: 12.2 mg/dL (ref 0.0–40.0)

## 2017-07-27 LAB — PSA: PSA: 3.79 ng/mL (ref 0.10–4.00)

## 2017-07-27 NOTE — Progress Notes (Signed)
   Subjective:    Patient ID: [name], male    DOB: 1951-02-16, 67 y.o.   MRN: [id number]  HPI Here for medicare wellness and physical, no new complaints. Please see A/P for status and treatment of chronic medical problems. Is drinking more alcohol than last year. Smokes intermittently but not regular and <1/2 ppd when smoking.  Diet: heart healthy Physical activity: sedentary Depression/mood screen: negative Hearing: intact to whispered voice, some mild loss bilaterally Visual acuity: grossly normal with lens, does not perform annual eye exam  ADLs: capable Fall risk: none Home safety: good Cognitive evaluation: intact to orientation, naming, recall and repetition EOL planning: adv directives discussed  I have personally reviewed and have noted 1. The patient's medical and social history - reviewed today no changes 2. Their use of alcohol, tobacco or illicit drugs 3. Their current medications and supplements 4. The patient's functional ability including ADL's, fall risks, home safety risks and hearing or visual impairment. 5. Diet and physical activities 6. Evidence for depression or mood disorders 7. Care team reviewed and updated (available in snapshot)  Review of Systems  Constitutional: Negative.   HENT: Negative.   Eyes: Negative.   Respiratory: Negative for cough, chest tightness and shortness of breath.   Cardiovascular: Negative for chest pain, palpitations and leg swelling.  Gastrointestinal: Negative for abdominal distention, abdominal pain, constipation, diarrhea, nausea and vomiting.  Genitourinary: Positive for enuresis.  Musculoskeletal: Negative.   Skin: Negative.   Neurological: Negative.   Psychiatric/Behavioral: Negative.       Objective:   Physical Exam  Constitutional: He is oriented to person, place, and time. He appears well-developed and well-nourished.  thin  HENT:  Head: Normocephalic and atraumatic.  Eyes: EOM are normal.  Neck: Normal  range of motion.  Cardiovascular: Normal rate and regular rhythm.  Pulmonary/Chest: Effort normal and breath sounds normal. No respiratory distress. He has no wheezes. He has no rales.  Abdominal: Soft. Bowel sounds are normal. He exhibits no distension. There is no tenderness. There is no rebound.  Musculoskeletal: He exhibits no edema.  Neurological: He is alert and oriented to person, place, and time. Coordination normal.  Skin: Skin is warm and dry.  Psychiatric: He has a normal mood and affect.   Vitals:   07/27/17 0831 07/27/17 0905  BP: (!) 176/82 (!) 150/80  Pulse: 66   Temp: 97.7 F (36.5 C)   TempSrc: Oral   SpO2: 99%   Weight: 149 lb (67.6 kg)   Height: 6\' 2"  (1.88 m)       Assessment & Plan:  Pneumonia 23 given at visit

## 2017-07-27 NOTE — Assessment & Plan Note (Signed)
Has xopenex inahler prn but he has not used that lately. He is continuing to smoke and we talked about how this will cause worsening disease and lung problems. Has chronic cough.

## 2017-07-27 NOTE — Assessment & Plan Note (Signed)
Chronic systolic heart failure most recent EF 45%. He is smoking still and continuing to use alcohol. Takes lasix prn. On ACE-I and beta blocker. On statin.

## 2017-07-27 NOTE — Patient Instructions (Signed)

## 2017-07-27 NOTE — Assessment & Plan Note (Signed)
Is drinking more alcohol than previous. He did not quantify drinks but more than when he cut back to 1 drink daily. 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

## 2017-07-27 NOTE — Assessment & Plan Note (Signed)
He is smoking again intermittently. We talked about how this is better than 1 PPD regular smoking but studies have shown that intermittent smoking often leads back to regular usage and that complete abstinence is better for his health. He does have some SOB and chronic cough which is related. Time spent counseling about tobacco usage: 3 minutes. I have asked about smoking and is smoking more than usual. The patient is advised to quit. The patient is not willing to quit. They would like to try to quit in the next 12 months. We will follow up with them in 1 year.

## 2017-07-27 NOTE — Assessment & Plan Note (Signed)
Taking lipitor 40 mg daily and checking lipid panel today for goal LDL <70.

## 2017-07-27 NOTE — Assessment & Plan Note (Signed)
Referral to urology for recurrent and worsening symptoms of 4-5 times per night urination. Is taking lasix prn only and not related to frequency of symptoms.

## 2017-07-27 NOTE — Assessment & Plan Note (Signed)
Taking amiodarone and metoprolol with rate control today. Sounds regular on exam. On xarelto and checking CBC for blood counts today.

## 2017-07-27 NOTE — Assessment & Plan Note (Signed)
Flu shot reminded. Pneumonia 23 given at visit to complete series. Shingrix counseled. Colonoscopy up to date. Tetanus up to date. Counseled about sun safety and mole surveillance. Counseled about the dangers of distracted driving. Counseled about tobacco and alcohol usage. Given 10 year screening recommendations.

## 2017-07-27 NOTE — Assessment & Plan Note (Signed)
BP slightly high today and taking lisinopril and metoprolol and amiodarone. In the past has had low BP and will monitor at home. Checking CMP today and adjust if needed.

## 2017-10-15 ENCOUNTER — Encounter: Payer: Self-pay | Admitting: Internal Medicine

## 2017-11-18 DIAGNOSIS — N401 Enlarged prostate with lower urinary tract symptoms: Secondary | ICD-10-CM | POA: Diagnosis not present

## 2017-11-18 DIAGNOSIS — R972 Elevated prostate specific antigen [PSA]: Secondary | ICD-10-CM | POA: Diagnosis not present

## 2017-11-18 DIAGNOSIS — R351 Nocturia: Secondary | ICD-10-CM | POA: Diagnosis not present

## 2017-11-18 DIAGNOSIS — R8271 Bacteriuria: Secondary | ICD-10-CM | POA: Diagnosis not present

## 2017-11-19 DIAGNOSIS — L218 Other seborrheic dermatitis: Secondary | ICD-10-CM | POA: Diagnosis not present

## 2017-11-19 DIAGNOSIS — D225 Melanocytic nevi of trunk: Secondary | ICD-10-CM | POA: Diagnosis not present

## 2017-11-19 DIAGNOSIS — L570 Actinic keratosis: Secondary | ICD-10-CM | POA: Diagnosis not present

## 2017-11-19 DIAGNOSIS — B078 Other viral warts: Secondary | ICD-10-CM | POA: Diagnosis not present

## 2017-11-19 DIAGNOSIS — L821 Other seborrheic keratosis: Secondary | ICD-10-CM | POA: Diagnosis not present

## 2017-12-15 DIAGNOSIS — B070 Plantar wart: Secondary | ICD-10-CM | POA: Diagnosis not present

## 2017-12-17 DIAGNOSIS — R972 Elevated prostate specific antigen [PSA]: Secondary | ICD-10-CM | POA: Diagnosis not present

## 2017-12-17 LAB — PSA: PSA: 3.54

## 2017-12-21 ENCOUNTER — Other Ambulatory Visit: Payer: Self-pay | Admitting: Cardiology

## 2017-12-21 MED ORDER — AMIODARONE HCL 200 MG PO TABS: 100.0000 mg | ORAL_TABLET | Freq: Every day | ORAL | 1 refills | Status: DC

## 2017-12-28 DIAGNOSIS — R972 Elevated prostate specific antigen [PSA]: Secondary | ICD-10-CM | POA: Diagnosis not present

## 2017-12-28 DIAGNOSIS — R351 Nocturia: Secondary | ICD-10-CM | POA: Diagnosis not present

## 2017-12-28 DIAGNOSIS — N401 Enlarged prostate with lower urinary tract symptoms: Secondary | ICD-10-CM | POA: Diagnosis not present

## 2017-12-28 DIAGNOSIS — R800 Isolated proteinuria: Secondary | ICD-10-CM | POA: Diagnosis not present

## 2017-12-31 NOTE — Progress Notes (Signed)
Cardiology Office Note:    Date:  01/01/2018   ID:  [name], DOB 12/05/1950, MRN [id number]  PCP:  Hoyt Koch, MD  Cardiologist:  No primary care provider on file.  Electrophysiologist:  None   Referring MD: Hoyt Koch, *     History of Present Illness:    [name] is a 67 y.o. male here for follow-up of paroxysmal atrial fibrillation, coronary artery disease with dilated cardiomyopathy.  Prior TEE cardioversion 03/06/2016 with amiodarone, seems to be maintaining sinus rhythm.  Ejection fraction improved from 25-45.  Cardiac catheterization 07/06/2010 showed chronic total occlusion of distal RCA with collaterals from right to left.  Medical management.  Has had hypotension in the past.  Also had atrial flutter ablation 10/18/2010.  Hyponatremia, urology  No troubles, no palps, no CP, mild SOB.  Had some episodes of excessive sweating and weakness over the past couple weeks.  Trouble maintaining his weight.-Recommend he discuss this with Dr. Sharlet Salina.  Past Medical History:  Diagnosis Date  . Adenomatous colon polyp   . Alcohol use   . Antiphospholipid antibody positive    ???In the past???but not proven according to patient  . Antiphospholipid antibody positive    ??? in the past, but not proven according to the patient.  . Atrial flutter ([place])    atrial flutter ablation,   Dr.Allred October 18, 2010  . BPH (benign prostatic hypertrophy)   . CAD (coronary artery disease)    Catheterization 2005 disease / catheterization July 06, 2010, chronic total occlusion distal RCA with collaterals from right and left side.  Medical therapy recommended, mild decreased LV function       . Carotid artery disease (HCC)    4-00% R. ICA, 86-76% LICA... Doppler.... January, 2010  /  Doppler... in January, 2011 no change  /  doppler1/27/2012...stable  1-95% RICA 09-32% LICA  . CHF (congestive heart failure) ([place])   . CRAO (central retinal artery occlusion)   .  Ejection fraction     EF 50-55%, echo, March, 2012... inferobasal and distal septal hypokinesis /      60%, echo, November, 2008  . GERD (gastroesophageal reflux disease)   . Hypercholesterolemia   . Hypertension   . Myocardial infarction ([place])   . Nonsustained ventricular tachycardia ([place])   . Palpitations    probable SVT, rate 170,, at home,, lasted one hour,, 2012  . Raynaud's syndrome   . TIA (transient ischemic attack)    Possible small vessel tia in the past.  . Tobacco abuse     Past Surgical History:  Procedure Laterality Date  . CARDIAC CATHETERIZATION  06/27/2003   2005.... mild irregularity of the LAD..( patient had had abnormal Myoview scan)  . CARDIOVERSION N/A 03/06/2016   Procedure: CARDIOVERSION;  Surgeon: Josue Hector, MD;  Location: [facility] ENDOSCOPY;  Service: Cardiovascular;  Laterality: N/A;  . INGUINAL HERNIA REPAIR  2009   right with mesh.  Dr Marlou Starks  . TEE WITHOUT CARDIOVERSION N/A 03/06/2016   Procedure: TRANSESOPHAGEAL ECHOCARDIOGRAM (TEE);  Surgeon: Josue Hector, MD;  Location[facility] ENDOSCOPY;  Service: Cardiovascular;  Laterality: N/A;  . THYROGLOSSAL DUCT CYST  2006   Dr. Wilburn Cornelia    Current Medications: Current Meds  Medication Sig  . amiodarone (PACERONE) 200 MG tablet Take 0.5 tablets (100 mg total) by mouth daily.  Marland Kitchen atorvastatin (LIPITOR) 40 MG tablet Take 1 tablet (40 mg total) by mouth daily.  . calcium carbonate (TUMS - DOSED IN MG  ELEMENTAL CALCIUM) 500 MG chewable tablet Chew 1 tablet by mouth daily as needed for heartburn.   . Desmopressin Acetate (NOCDURNA) 27.7 MCG SUBL Place 27.7 mcg under the tongue daily.  Marland Kitchen dextromethorphan-guaiFENesin (MUCINEX DM) 30-600 MG 12hr tablet Take 1 tablet by mouth 2 (two) times daily as needed for cough.  . folic acid (FOLVITE) 1 MG tablet TAKE 1 TABLET BY MOUTH EVERY DAY  . furosemide (LASIX) 20 MG tablet Take 1 tablet (20 mg total) by mouth daily as needed for fluid or edema. Use if you become short of  breath or experience weight gain.  Marland Kitchen lisinopril (PRINIVIL,ZESTRIL) 5 MG tablet TAKE 1 TABLET(5 MG) BY MOUTH DAILY  . metoprolol succinate (TOPROL-XL) 50 MG 24 hr tablet TAKE 1 TABLET BY MOUTH EVERY DAY WITH OR IMMEDIATELY FOLLOWING A MEAL  . Multiple Vitamin (MULTIVITAMIN WITH MINERALS) TABS tablet Take 1 tablet by mouth daily.  . rivaroxaban (XARELTO) 20 MG TABS tablet TAKE 1 TABLET(20 MG) BY MOUTH DAILY WITH SUPPER  . thiamine 100 MG tablet Take 1 tablet (100 mg total) by mouth daily.     Allergies:   Cialis [tadalafil]; Levitra [vardenafil]; and Viagra [sildenafil citrate]   Social History   Socioeconomic History  . Marital status: Married    Spouse name: Not on file  . Number of children: Not on file  . Years of education: Not on file  . Highest education level: Not on file  Occupational History  . Occupation: Artist  . Financial resource strain: Not on file  . Food insecurity:    Worry: Not on file    Inability: Not on file  . Transportation needs:    Medical: Not on file    Non-medical: Not on file  Tobacco Use  . Smoking status: Current Some Day Smoker    Packs/day: 0.50    Years: 40.00    Pack years: 20.00    Types: Cigarettes    Last attempt to quit: 03/05/2016    Years since quitting: 1.8  . Smokeless tobacco: Never Used  . Tobacco comment: 1 pack a day  Substance and Sexual Activity  . Alcohol use: Yes    Alcohol/week: 21.0 standard drinks    Types: 21 Cans of beer per week    Comment: he continues to drink heavily despite my recommendation to quit  . Drug use: No  . Sexual activity: Not on file  Lifestyle  . Physical activity:    Days per week: Not on file    Minutes per session: Not on file  . Stress: Not on file  Relationships  . Social connections:    Talks on phone: Not on file    Gets together: Not on file    Attends religious service: Not on file    Active member of club or organization: Not on file    Attends meetings of  clubs or organizations: Not on file    Relationship status: Not on file  Other Topics Concern  . Not on file  Social History Narrative  . Not on file     Family History: The patient's family history is not on file. He was adopted.  ROS:   Please see the history of present illness.     All other systems reviewed and are negative.  EKGs/Labs/Other Studies Reviewed:    The following studies were reviewed today: Prior office notes, lab work including thyroid and liver functions have been normal, EKG  EKG:  EKG is  not ordered today.  Prior EKG 07/06/2017 shows heart rate of 53 sinus rhythm borderline intraventricular conduction delay.  Personally reviewed and interpreted  Recent Labs: 07/06/2017: TSH 3.750 07/27/2017: ALT 18; BUN 15; Creatinine, Ser 1.15; Hemoglobin 16.0; Platelets 217.0; Potassium 4.5; Sodium 137  Recent Lipid Panel    Component Value Date/Time   CHOL 137 07/27/2017 0906   TRIG 61.0 07/27/2017 0906   HDL 73.10 07/27/2017 0906   CHOLHDL 2 07/27/2017 0906   VLDL 12.2 07/27/2017 0906   LDLCALC 52 07/27/2017 0906    Physical Exam:    VS:  BP 120/68   Pulse 71   Ht 6\' 2"  (1.88 m)   Wt 147 lb 6.4 oz (66.9 kg)   SpO2 96%   BMI 18.93 kg/m     Wt Readings from Last 3 Encounters:  01/01/18 147 lb 6.4 oz (66.9 kg)  07/27/17 149 lb (67.6 kg)  07/06/17 151 lb 9.6 oz (68.8 kg)     GEN: Thin in no acute distress HEENT: Normal, bluish hue to nose NECK: No JVD; No carotid bruits LYMPHATICS: No lymphadenopathy CARDIAC: RRR, no murmurs, rubs, gallops RESPIRATORY:  Clear to auscultation without rales, positive bilateral lower lobe wheezing no rhonchi  ABDOMEN: Soft, non-tender, non-distended MUSCULOSKELETAL:  No edema; No deformity  SKIN: Warm and dry NEUROLOGIC:  Alert and oriented x 3 PSYCHIATRIC:  Normal affect   ASSESSMENT:    1. Paroxysmal atrial fibrillation (HCC)   2. On amiodarone therapy   3. Pure hypercholesterolemia   4. Coronary artery disease  involving native coronary artery of native heart without angina pectoris   5. Bilateral carotid artery occlusion   6. Bradycardia   7. Chronic systolic heart failure (HCC)    PLAN:    In order of problems listed above:  Symptomatic bradycardia - At prior visit decreased amiodarone to 100 mg.  Continue with beta-blocker.  Overall doing well.  Heart rate today 71.  Paroxysmal atrial fibrillation - Amiodarone-lab work recently done at urology office. -Xarelto, no bleeding.  Prior atrial flutter ablation - Stable.  No recurrent issues  Coronary artery disease - Chronic total occlusion RCA with collaterals.  Prior nuclear stress test September 2015 showed no evidence of significant ischemia.  Continue with medical management.  Bilateral carotid artery disease mild -Less than 39%.  Continue with aggressive secondary prevention, statin.  We may repeat his carotid ultrasound in 2 or 3 years.  Hyperlipidemia -Continue with statin therapy high intensity.  Dilated cardiomyopathy -Ejection fraction has improved from 20% up to 45%.  Euvolemic.  Consider repeating echocardiogram at later date to continue to help guide therapy.  COPD - Saw Dr. Lake Bells previously 09/05/2016.  I am comfortable with him being on inhalers if necessary.  Wheezing heard on exam.  Nocturia -Has been seen by urology, 24-hour urine and extensive blood work has been performed.  He has been given nocdura (desmopressin) to help with nightly urination and potentially with hyponatremia.  Continue to discuss weight, hyponatremia with Dr. Sharlet Salina  Medication Adjustments/Labs and Tests Ordered: Current medicines are reviewed at length with the patient today.  Concerns regarding medicines are outlined above.  No orders of the defined types were placed in this encounter.  No orders of the defined types were placed in this encounter.   Patient Instructions  Medication Instructions:  The current medical regimen is  effective;  continue present plan and medications.  If you need a refill on your cardiac medications before your next appointment, please call your  pharmacy.   Follow-Up: At [facility], you and your health needs are our priority.  As part of our continuing mission to provide you with exceptional heart care, we have created designated Provider Care Teams.  These Care Teams include your primary Cardiologist (physician) and Advanced Practice Providers (APPs -  Physician Assistants and Nurse Practitioners) who all work together to provide you with the care you need, when you need it. You will need a follow up appointment in 6 months with Cecilie Kicks, NP and 1 yr with Dr [name].  Please call our office 2 months in advance to schedule this appointment.  You may see Dr [name] or one of the following Advanced Practice Providers on your designated Care Team:   Truitt Merle, NP Cecilie Kicks, NP . Kathyrn Drown, NP  Thank you for choosing [facility]!!         Signed, Candee Furbish, MD  01/01/2018 10:39 AM    [place]

## 2018-01-01 ENCOUNTER — Encounter: Payer: Self-pay | Admitting: Cardiology

## 2018-01-01 ENCOUNTER — Ambulatory Visit: Payer: Medicare HMO | Admitting: Cardiology

## 2018-01-01 VITALS — BP 120/68 | HR 71 | Ht 74.0 in | Wt 147.4 lb

## 2018-01-01 DIAGNOSIS — I480 Paroxysmal atrial fibrillation: Secondary | ICD-10-CM | POA: Diagnosis not present

## 2018-01-01 DIAGNOSIS — R001 Bradycardia, unspecified: Secondary | ICD-10-CM

## 2018-01-01 DIAGNOSIS — I5022 Chronic systolic (congestive) heart failure: Secondary | ICD-10-CM | POA: Diagnosis not present

## 2018-01-01 DIAGNOSIS — Z79899 Other long term (current) drug therapy: Secondary | ICD-10-CM | POA: Diagnosis not present

## 2018-01-01 DIAGNOSIS — I6523 Occlusion and stenosis of bilateral carotid arteries: Secondary | ICD-10-CM | POA: Diagnosis not present

## 2018-01-01 DIAGNOSIS — E7800 Pure hypercholesterolemia, unspecified: Secondary | ICD-10-CM | POA: Diagnosis not present

## 2018-01-01 DIAGNOSIS — I2510 Atherosclerotic heart disease of native coronary artery without angina pectoris: Secondary | ICD-10-CM | POA: Diagnosis not present

## 2018-01-01 NOTE — Patient Instructions (Addendum)
Medication Instructions:  The current medical regimen is effective;  continue present plan and medications.  If you need a refill on your cardiac medications before your next appointment, please call your pharmacy.   Follow-Up: At [facility], you and your health needs are our priority.  As part of our continuing mission to provide you with exceptional heart care, we have created designated Provider Care Teams.  These Care Teams include your primary Cardiologist (physician) and Advanced Practice Providers (APPs -  Physician Assistants and Nurse Practitioners) who all work together to provide you with the care you need, when you need it. You will need a follow up appointment in 6 months with Cecilie Kicks, NP and 1 yr with Dr [name].  Please call our office 2 months in advance to schedule this appointment.  You may see Dr [name] or one of the following Advanced Practice Providers on your designated Care Team:   Truitt Merle, NP Cecilie Kicks, NP . Kathyrn Drown, NP  Thank you for choosing  [facility]!!

## 2018-01-05 DIAGNOSIS — E871 Hypo-osmolality and hyponatremia: Secondary | ICD-10-CM | POA: Diagnosis not present

## 2018-01-05 DIAGNOSIS — L720 Epidermal cyst: Secondary | ICD-10-CM | POA: Diagnosis not present

## 2018-01-05 DIAGNOSIS — B070 Plantar wart: Secondary | ICD-10-CM | POA: Diagnosis not present

## 2018-01-06 ENCOUNTER — Encounter: Payer: Self-pay | Admitting: Internal Medicine

## 2018-01-06 NOTE — Progress Notes (Signed)
Abstracted and sent to scan  

## 2018-01-13 ENCOUNTER — Ambulatory Visit (INDEPENDENT_AMBULATORY_CARE_PROVIDER_SITE_OTHER): Payer: Medicare HMO | Admitting: Internal Medicine

## 2018-01-13 ENCOUNTER — Encounter: Payer: Self-pay | Admitting: Internal Medicine

## 2018-01-13 ENCOUNTER — Ambulatory Visit: Payer: Medicare HMO | Admitting: Cardiology

## 2018-01-13 VITALS — BP 130/70 | HR 66 | Temp 97.5°F | Ht 74.0 in | Wt 149.0 lb

## 2018-01-13 DIAGNOSIS — J432 Centrilobular emphysema: Secondary | ICD-10-CM | POA: Diagnosis not present

## 2018-01-13 DIAGNOSIS — Z23 Encounter for immunization: Secondary | ICD-10-CM

## 2018-01-13 LAB — PROTEIN,TOTAL,URINE: Total Protein, Urine: 299

## 2018-01-13 MED ORDER — UMECLIDINIUM-VILANTEROL 62.5-25 MCG/INH IN AEPB: 1.0000 | INHALATION_SPRAY | Freq: Every day | RESPIRATORY_TRACT | 3 refills | Status: DC

## 2018-01-13 NOTE — Progress Notes (Signed)
   Subjective:    Patient ID: [name], male    DOB: 11-03-1950, 67 y.o.   MRN: [id number]  HPI The patient is a 67 YO man coming in for SOB. He has had spirometry in the past with severe airway obstruction and FEV1 42 % expected. He is still smoking and is not sure he can quit now. He is having SOB more in the morning. He feels that this is causing loss of QOL. He is limiting activities because he cannot breathe well. Denies fevers or chills. Denies more cough recently. Overall this has been a gradual change in the last several years.   Review of Systems  Constitutional: Negative.   HENT: Negative.   Eyes: Negative.   Respiratory: Positive for shortness of breath. Negative for cough and chest tightness.   Cardiovascular: Negative for chest pain, palpitations and leg swelling.  Gastrointestinal: Negative for abdominal distention, abdominal pain, constipation, diarrhea, nausea and vomiting.  Musculoskeletal: Negative.   Skin: Negative.   Neurological: Negative.   Psychiatric/Behavioral: Negative.       Objective:   Physical Exam  Constitutional: He is oriented to person, place, and time. He appears well-developed and well-nourished.  HENT:  Head: Normocephalic and atraumatic.  Eyes: EOM are normal.  Neck: Normal range of motion.  Cardiovascular: Normal rate and regular rhythm.  Pulmonary/Chest: Effort normal. No respiratory distress. He has wheezes. He has no rales.  Mild expiratory wheezing  Abdominal: Soft. Bowel sounds are normal. He exhibits no distension. There is no tenderness. There is no rebound.  Musculoskeletal: He exhibits no edema.  Neurological: He is alert and oriented to person, place, and time. Coordination normal.  Skin: Skin is warm and dry.  Psychiatric: He has a normal mood and affect.   Vitals:   01/13/18 0851  BP: 130/70  Pulse: 66  Temp: (!) 97.5 F (36.4 C)  TempSrc: Oral  Weight: 149 lb (67.6 kg)  Height: 6\' 2"  (1.88 m)      Assessment &  Plan:  Flu shot given at visit

## 2018-01-13 NOTE — Assessment & Plan Note (Signed)
Severe on PFTs and he is ready to try an inhaler to help. Rx for anoro. Sample given for breo to try (no anoro available). Instructed on usage of the device in the office.

## 2018-01-13 NOTE — Patient Instructions (Signed)
We have given you a sample to try 1 puff daily to see if this helps your breathing.   We have sent in a prescription for anoro to your pharmacy to help long term with breathing.

## 2018-01-19 ENCOUNTER — Telehealth: Payer: Self-pay

## 2018-01-19 NOTE — Telephone Encounter (Signed)
Labs received and placed in MD folder

## 2018-01-19 NOTE — Telephone Encounter (Signed)
Copied from [place] [id number]. Topic: General - Other >> Jan 19, 2018 10:18 AM Carolyn Stare wrote:  Dr Jeffie Pollock with [facility] fax over some labs report to Dr Sharlet Salina and is asking if those labs were received. He has something going on

## 2018-01-25 ENCOUNTER — Encounter: Payer: Self-pay | Admitting: Internal Medicine

## 2018-01-25 DIAGNOSIS — R351 Nocturia: Secondary | ICD-10-CM | POA: Diagnosis not present

## 2018-01-25 NOTE — Telephone Encounter (Signed)
Pt's wife, Eustaquio Maize, calling.  States that they have been waiting to hear from Dr. Sharlet Salina regarding the reports that were sent over from [facility].

## 2018-01-25 NOTE — Telephone Encounter (Signed)
Can you please call and make patient an office visit to discuss. Thank you

## 2018-01-25 NOTE — Telephone Encounter (Signed)
Why don't they just come in for visit to discuss so we can discuss in detail so she can get the whole picture.

## 2018-01-25 NOTE — Telephone Encounter (Signed)
Called wife back and she is wanting to know what could cause his protein numbers to be spilling over, patient's wife is worried and just wants to make sure her husband is okay, and if she should not worry why should she not worry.

## 2018-01-27 NOTE — Telephone Encounter (Signed)
Trid to call patient x2, no VM set up

## 2018-02-03 ENCOUNTER — Ambulatory Visit (INDEPENDENT_AMBULATORY_CARE_PROVIDER_SITE_OTHER): Payer: Medicare HMO | Admitting: Internal Medicine

## 2018-02-03 ENCOUNTER — Encounter: Payer: Self-pay | Admitting: Internal Medicine

## 2018-02-03 DIAGNOSIS — R809 Proteinuria, unspecified: Secondary | ICD-10-CM | POA: Diagnosis not present

## 2018-02-03 MED ORDER — LISINOPRIL 10 MG PO TABS: 10.0000 mg | ORAL_TABLET | Freq: Every day | ORAL | 3 refills | Status: DC

## 2018-02-03 NOTE — Assessment & Plan Note (Signed)
Discussed results of urology's 24 hour urine of 299. We talked about how 30-300 is micro amounts of protein in urine and we will increase lisinopril to 10 mg daily for renal protection. Likely from past blood pressure and alcohol and heart related health problems. BP at goal and no diabetes.

## 2018-02-03 NOTE — Progress Notes (Signed)
   Subjective:    Patient ID: [name], male    DOB: 04/24/1950, 67 y.o.   MRN: [id number]  HPI The patient is a 67 YO man coming in for discussion about urine results from urology of 299 protein in 24 hour urine. They did this test as he had persistent protein in urine. Denies foam in urine. Denies missing BP medications.   Review of Systems  Constitutional: Negative.   HENT: Negative.   Eyes: Negative.   Respiratory: Negative for cough, chest tightness and shortness of breath.   Cardiovascular: Negative for chest pain, palpitations and leg swelling.  Gastrointestinal: Negative for abdominal distention, abdominal pain, constipation, diarrhea, nausea and vomiting.  Musculoskeletal: Negative.   Skin: Negative.   Neurological: Negative.   Psychiatric/Behavioral: Negative.       Objective:   Physical Exam  Constitutional: He is oriented to person, place, and time. He appears well-developed and well-nourished.  HENT:  Head: Normocephalic and atraumatic.  Eyes: EOM are normal.  Neck: Normal range of motion.  Cardiovascular: Normal rate and regular rhythm.  Pulmonary/Chest: Effort normal and breath sounds normal. No respiratory distress. He has no wheezes. He has no rales.  Abdominal: Soft. Bowel sounds are normal. He exhibits no distension. There is no tenderness. There is no rebound.  Musculoskeletal: He exhibits no edema.  Neurological: He is alert and oriented to person, place, and time. Coordination normal.  Skin: Skin is warm and dry.  Psychiatric: He has a normal mood and affect.   Vitals:   02/03/18 0829  BP: 130/82  Pulse: 60  Temp: 97.7 F (36.5 C)  TempSrc: Oral  Weight: 150 lb (68 kg)  Height: 6\' 2"  (1.88 m)      Assessment & Plan:  Visit time 25 minutes: greater than 50% of that time was spent in face to face counseling and coordination of care with the patient: counseled about nature of kidney function, protein in urine, health conditions that cause this,  degrees, changes to his health due to results.

## 2018-02-03 NOTE — Patient Instructions (Signed)
We will increase the lisinopril to 10 mg daily. We have sent in a new prescription but you can take 2 pills daily of the ones you have left.

## 2018-02-08 ENCOUNTER — Other Ambulatory Visit: Payer: Self-pay | Admitting: Cardiology

## 2018-02-15 DIAGNOSIS — R351 Nocturia: Secondary | ICD-10-CM | POA: Diagnosis not present

## 2018-03-02 DIAGNOSIS — R800 Isolated proteinuria: Secondary | ICD-10-CM | POA: Diagnosis not present

## 2018-03-02 DIAGNOSIS — N401 Enlarged prostate with lower urinary tract symptoms: Secondary | ICD-10-CM | POA: Diagnosis not present

## 2018-03-02 DIAGNOSIS — R351 Nocturia: Secondary | ICD-10-CM | POA: Diagnosis not present

## 2018-03-02 DIAGNOSIS — R972 Elevated prostate specific antigen [PSA]: Secondary | ICD-10-CM | POA: Diagnosis not present

## 2018-04-13 DIAGNOSIS — R351 Nocturia: Secondary | ICD-10-CM | POA: Diagnosis not present

## 2018-05-03 ENCOUNTER — Other Ambulatory Visit: Payer: Self-pay | Admitting: Cardiology

## 2018-05-19 DIAGNOSIS — N401 Enlarged prostate with lower urinary tract symptoms: Secondary | ICD-10-CM | POA: Diagnosis not present

## 2018-05-19 DIAGNOSIS — R351 Nocturia: Secondary | ICD-10-CM | POA: Diagnosis not present

## 2018-05-19 DIAGNOSIS — R972 Elevated prostate specific antigen [PSA]: Secondary | ICD-10-CM | POA: Diagnosis not present

## 2018-06-21 ENCOUNTER — Other Ambulatory Visit: Payer: Self-pay | Admitting: Cardiology

## 2018-07-05 ENCOUNTER — Other Ambulatory Visit: Payer: Self-pay | Admitting: Cardiology

## 2018-07-06 ENCOUNTER — Telehealth (INDEPENDENT_AMBULATORY_CARE_PROVIDER_SITE_OTHER): Payer: Medicare HMO | Admitting: Cardiology

## 2018-07-06 ENCOUNTER — Encounter: Payer: Self-pay | Admitting: Cardiology

## 2018-07-06 ENCOUNTER — Other Ambulatory Visit: Payer: Self-pay

## 2018-07-06 VITALS — BP 117/56 | HR 57 | Ht 74.0 in | Wt 154.8 lb

## 2018-07-06 DIAGNOSIS — J438 Other emphysema: Secondary | ICD-10-CM

## 2018-07-06 DIAGNOSIS — I480 Paroxysmal atrial fibrillation: Secondary | ICD-10-CM

## 2018-07-06 DIAGNOSIS — I10 Essential (primary) hypertension: Secondary | ICD-10-CM

## 2018-07-06 DIAGNOSIS — I2510 Atherosclerotic heart disease of native coronary artery without angina pectoris: Secondary | ICD-10-CM

## 2018-07-06 DIAGNOSIS — E785 Hyperlipidemia, unspecified: Secondary | ICD-10-CM

## 2018-07-06 DIAGNOSIS — I5022 Chronic systolic (congestive) heart failure: Secondary | ICD-10-CM

## 2018-07-06 DIAGNOSIS — Z79899 Other long term (current) drug therapy: Secondary | ICD-10-CM

## 2018-07-06 NOTE — Progress Notes (Signed)
Virtual Visit via Video Note   This visit type was conducted due to national recommendations for restrictions regarding the COVID-19 Pandemic (e.g. social distancing) in an effort to limit this patient's exposure and mitigate transmission in our community.  Due to his co-morbid illnesses, this patient is at least at moderate risk for complications without adequate follow up.  This format is felt to be most appropriate for this patient at this time.  All issues noted in this document were discussed and addressed.  A limited physical exam was performed with this format.  Please refer to the patient's chart for his consent to telehealth for [facility].   Evaluation Performed:  Follow-up visit  Date:  07/06/2018   ID:  [name], DOB [birth year], MRN [id number]  Patient Location: Home Provider Location: Home  PCP:  Hoyt Koch, MD  Cardiologist:  Candee Furbish, MD  Electrophysiologist:  None   Chief Complaint: Follow-up of atrial fibrillation  History of Present Illness:    [name] is a 68 y.o. male with paroxysmal atrial fibrillation, coronary artery disease, dilated cardiomyopathy with ejection fraction improving from 25% up to 45% here for follow-up.  Prior atrial flutter ablation in 2012.  Prior TEE cardioversion on 03/06/2016 with amiodarone seems to be maintaining sinus rhythm.  Cardiac catheterization 07/06/2010 showed chronic total occlusion of distal RCA with collaterals from right to left, medical management.  He has had problems with hypotension in the past.  Overall he has been doing quite well.  Exercising still.  Walking the dog 3 miles a day.  He feels better since he has started the Anoro inhaler.  Still smoking.  We discussed.  Denies chills nausea vomiting  The patient does not have symptoms concerning for COVID-19 infection (fever, chills, cough, or new shortness of breath).    Past Medical History:  Diagnosis Date  . Adenomatous colon polyp    . Alcohol use   . Antiphospholipid antibody positive    ???In the past???but not proven according to patient  . Antiphospholipid antibody positive    ??? in the past, but not proven according to the patient.  . Atrial flutter ([place])    atrial flutter ablation,   Dr.Allred October 18, 2010  . BPH (benign prostatic hypertrophy)   . CAD (coronary artery disease)    Catheterization 2005 disease / catheterization July 06, 2010, chronic total occlusion distal RCA with collaterals from right and left side.  Medical therapy recommended, mild decreased LV function       . Carotid artery disease (HCC)    4-66% R. ICA, 59-93% LICA... Doppler.... January, 2010  /  Doppler... in January, 2011 no change  /  doppler1/27/2012...stable  5-70% RICA 17-79% LICA  . CHF (congestive heart failure) ([place])   . CRAO (central retinal artery occlusion)   . Ejection fraction     EF 50-55%, echo, March, 2012... inferobasal and distal septal hypokinesis /      60%, echo, November, 2008  . GERD (gastroesophageal reflux disease)   . Hypercholesterolemia   . Hypertension   . Myocardial infarction ([place])   . Nonsustained ventricular tachycardia ([place])   . Palpitations    probable SVT, rate 170,, at home,, lasted one hour,, 2012  . Raynaud's syndrome   . TIA (transient ischemic attack)    Possible small vessel tia in the past.  . Tobacco abuse    Past Surgical History:  Procedure Laterality Date  . CARDIAC CATHETERIZATION  06/27/2003  2005.... mild irregularity of the LAD..( patient had had abnormal Myoview scan)  . CARDIOVERSION N/A 03/06/2016   Procedure: CARDIOVERSION;  Surgeon: Josue Hector, MD;  Location[facility] ENDOSCOPY;  Service: Cardiovascular;  Laterality: N/A;  . INGUINAL HERNIA REPAIR  2009   right with mesh.  Dr Marlou Starks  . TEE WITHOUT CARDIOVERSION N/A 03/06/2016   Procedure: TRANSESOPHAGEAL ECHOCARDIOGRAM (TEE);  Surgeon: Josue Hector, MD;  Location[facility]COPY;  Service: Cardiovascular;  Laterality: N/A;   . THYROGLOSSAL DUCT CYST  2006   Dr. Wilburn Cornelia     Current Meds  Medication Sig  . amiodarone (PACERONE) 200 MG tablet Take 0.5 tablets (100 mg total) by mouth daily.  Marland Kitchen atorvastatin (LIPITOR) 40 MG tablet TAKE 1 TABLET BY MOUTH EVERY DAY  . calcium carbonate (TUMS - DOSED IN MG ELEMENTAL CALCIUM) 500 MG chewable tablet Chew 1 tablet by mouth daily as needed for heartburn.   . Desmopressin Acetate (NOCDURNA) 27.7 MCG SUBL Place 27.7 mcg under the tongue daily.  . folic acid (FOLVITE) 1 MG tablet TAKE 1 TABLET BY MOUTH EVERY DAY  . furosemide (LASIX) 20 MG tablet Take 1 tablet (20 mg total) by mouth daily as needed for fluid or edema. Use if you become short of breath or experience weight gain.  Marland Kitchen lisinopril (PRINIVIL,ZESTRIL) 10 MG tablet Take 1 tablet (10 mg total) by mouth daily. TAKE 1 TABLET(5 MG) BY MOUTH DAILY  . metoprolol succinate (TOPROL-XL) 50 MG 24 hr tablet TAKE 1 TABLET BY MOUTH EVERY DAY WITH OR IMMEDIATELY FOLLOWING A MEAL  . Multiple Vitamin (MULTIVITAMIN WITH MINERALS) TABS tablet Take 1 tablet by mouth daily.  Marland Kitchen umeclidinium-vilanterol (ANORO ELLIPTA) 62.5-25 MCG/INH AEPB Inhale 1 puff into the lungs daily.  Alveda Reasons 20 MG TABS tablet TAKE 1 TABLET(20 MG) BY MOUTH DAILY WITH SUPPER     Allergies:   Cialis [tadalafil]; Levitra [vardenafil]; and Viagra [sildenafil citrate]   Social History   Tobacco Use  . Smoking status: Current Some Day Smoker    Packs/day: 0.50    Years: 40.00    Pack years: 20.00    Types: Cigarettes    Last attempt to quit: 03/05/2016    Years since quitting: 2.3  . Smokeless tobacco: Never Used  . Tobacco comment: 1 pack a day  Substance Use Topics  . Alcohol use: Yes    Alcohol/week: 21.0 standard drinks    Types: 21 Cans of beer per week    Comment: he continues to drink heavily despite my recommendation to quit  . Drug use: No     Family Hx: The patient's family history is not on file. He was adopted.  ROS:   Please see the  history of present illness.    Denies any fever chills nausea vomiting syncope bleeding. All other systems reviewed and are negative.   Prior CV studies:   The following studies were reviewed today:  Echocardiogram 2017-EF 45%  Event monitor 04/21/2016-no atrial fibrillation, occasional PVCs with bigeminy pattern that correlated with symptoms.  Labs/Other Tests and Data Reviewed:    EKG:  An ECG dated 07/06/17 was personally reviewed today and demonstrated:  Sinus rhythm incomplete left bundle branch block  Recent Labs: 07/27/2017: ALT 18; BUN 15; Creatinine, Ser 1.15; Hemoglobin 16.0; Platelets 217.0; Potassium 4.5; Sodium 137   Recent Lipid Panel Lab Results  Component Value Date/Time   CHOL 137 07/27/2017 09:06 AM   TRIG 61.0 07/27/2017 09:06 AM   HDL 73.10 07/27/2017 09:06 AM  CHOLHDL 2 07/27/2017 09:06 AM   LDLCALC 52 07/27/2017 09:06 AM    Wt Readings from Last 3 Encounters:  07/06/18 154 lb 12.8 oz (70.2 kg)  02/03/18 150 lb (68 kg)  01/13/18 149 lb (67.6 kg)     Objective:    Vital Signs:  BP (!) 117/56   Pulse (!) 57   Ht 6\' 2"  (1.88 m)   Wt 154 lb 12.8 oz (70.2 kg)   BMI 19.88 kg/m    VITAL SIGNS:  reviewed GEN:  no acute distress EYES:  sclerae anicteric, EOMI - Extraocular Movements Intact RESPIRATORY:  normal respiratory effort, symmetric expansion CARDIOVASCULAR:  no peripheral edema SKIN:  Mild hyperpigmentation noted on nose, this was seen prior to amiodarone use MUSCULOSKELETAL:  no obvious deformities. NEURO:  alert and oriented x 3, no obvious focal deficit PSYCH:  normal affect  ASSESSMENT & PLAN:    Paroxysmal atrial fibrillation - On amiodarone, 100 mg a day, beta-blocker, doing well.  Maintaining sinus rhythm.  Continuing to check liver functions thyroid functions and pulmonary status. -In 6 weeks, I will set him up for lab work, CBC, TSH, free T4, complete metabolic profile, lipid panel  Prior atrial flutter ablation -No recurrence   Coronary artery disease -Chronic total occlusion of RCA.  Medical management.  On atorvastatin 40 mg.  Last LDL 58.  Dilated cardiomyopathy - EF increased from 20% up to 45%.  Medications reviewed, stable.  COPD FEV1 42% - Previously has been seen by pulmonary.  Tobacco use -Continue to encourage cessation.  Proteinuria - On ACE inhibitor  COVID-19 Education: The signs and symptoms of COVID-19 were discussed with the patient and how to seek care for testing (follow up with PCP or arrange E-visit).  The importance of social distancing was discussed today.  Time:   Today, I have spent 11 minutes with the patient with telehealth technology discussing the above problems.     Medication Adjustments/Labs and Tests Ordered: Current medicines are reviewed at length with the patient today.  Concerns regarding medicines are outlined above.   Tests Ordered: Orders Placed This Encounter  Procedures  . CBC  . Comprehensive metabolic panel  . T4, free  . TSH  . Lipid panel    Medication Changes: No orders of the defined types were placed in this encounter.   Disposition:  Follow up in 6 month(s)  Signed, Candee Furbish, MD  07/06/2018 12:43 PM    [place]

## 2018-07-06 NOTE — Patient Instructions (Addendum)
Medication Instructions:  The current medical regimen is effective;  continue present plan and medications.  If you need a refill on your cardiac medications before your next appointment, please call your pharmacy.   Lab work: Please have fasting blood work August 18, 2018.  (Lipid, TSH,Free T4, CMP and CBC)  I have scheduled your for blood work this day however, if this is not a good day for you, you may reschedule according to your schedule.  If you have labs (blood work) drawn today and your tests are completely normal, you will receive your results only by: Marland Kitchen MyChart Message (if you have MyChart) OR . A paper copy in the mail If you have any lab test that is abnormal or we need to change your treatment, we will call you to review the results.  Follow-Up: Follow up in 6 months with Dr. [name].  You will receive a letter in the mail 2 months before you are due.  Please call us when you receive this letter to schedule your follow up appointment.  Thank you for choosing [facility]!!

## 2018-07-09 ENCOUNTER — Ambulatory Visit: Payer: Medicare HMO | Admitting: Cardiology

## 2018-08-02 NOTE — Progress Notes (Signed)
Subjective:   [name] is a 68 y.o. male who presents for Medicare Annual/Subsequent preventive examination.  Review of Systems:  No ROS.  Medicare Wellness Virtual Visit.  Visual/audio telehealth visit, UTA vital signs.   See social history for additional risk factors. I connected with patient by a telephone and verified that I am speaking with the correct person using two identifiers. Patient stated full name and DOB. Patient gave permission to continue with telephonic visit. Patient's location was at home and Nurse's location was at [place] office.  Cardiac Risk Factors include: advanced age (>24men, >57 women);dyslipidemia;male gender;hypertension;smoking/ tobacco exposure  Sleep patterns: feels rested on waking, gets up 2-3 times nightly to void and sleeps 6-7 hours nightly.    Home Safety/Smoke Alarms: Feels safe in home. Smoke alarms in place.  Living environment; residence and Firearm Safety: 1-story house/ trailer. Lives with wife, no needs for DME, good support system Seat Belt Safety/Bike Helmet: Wears seat belt.   PSA-  Lab Results  Component Value Date   PSA 3.54 12/17/2017   PSA 3.79 07/27/2017   PSA 2.12 08/20/2013       Objective:    Vitals: There were no vitals taken for this visit.  There is no height or weight on file to calculate BMI.  Advanced Directives 08/03/2018 03/06/2016 03/05/2016  Does Patient Have a Medical Advance Directive? No;Yes No No  Type of Paramedic of [place];Living will - -  Copy of [place] in Chart? No - copy requested - -  Would patient like information on creating a medical advance directive? - No - Patient declined No - Patient declined    Tobacco Social History   Tobacco Use  Smoking Status Current Some Day Smoker  . Packs/day: 0.50  . Years: 40.00  . Pack years: 20.00  . Types: Cigarettes  . Last attempt to quit: 03/05/2016  . Years since quitting: 2.4  Smokeless Tobacco  Never Used  Tobacco Comment   .5 pack     Ready to quit: No Counseling given: No Comment: .5 pack  Past Medical History:  Diagnosis Date  . Adenomatous colon polyp   . Alcohol use   . Antiphospholipid antibody positive    ???In the past???but not proven according to patient  . Antiphospholipid antibody positive    ??? in the past, but not proven according to the patient.  . Atrial flutter ([place])    atrial flutter ablation,   Dr.Allred October 18, 2010  . BPH (benign prostatic hypertrophy)   . CAD (coronary artery disease)    Catheterization 2005 disease / catheterization July 06, 2010, chronic total occlusion distal RCA with collaterals from right and left side.  Medical therapy recommended, mild decreased LV function       . Carotid artery disease (HCC)    5-40% R. ICA, 98-11% LICA... Doppler.... January, 2010  /  Doppler... in January, 2011 no change  /  doppler1/27/2012...stable  9-14% RICA 78-29% LICA  . CHF (congestive heart failure) ([place])   . CRAO (central retinal artery occlusion)   . Ejection fraction     EF 50-55%, echo, March, 2012... inferobasal and distal septal hypokinesis /      60%, echo, November, 2008  . GERD (gastroesophageal reflux disease)   . Hypercholesterolemia   . Hypertension   . Myocardial infarction ([place])   . Nonsustained ventricular tachycardia ([place])   . Palpitations    probable SVT, rate 170,, at home,, lasted one  hour,, 2012  . Raynaud's syndrome   . TIA (transient ischemic attack)    Possible small vessel tia in the past.  . Tobacco abuse    Past Surgical History:  Procedure Laterality Date  . CARDIAC CATHETERIZATION  06/27/2003   2005.... mild irregularity of the LAD..( patient had had abnormal Myoview scan)  . CARDIOVERSION N/A 03/06/2016   Procedure: CARDIOVERSION;  Surgeon: Josue Hector, MD;  Location: [facility] ENDOSCOPY;  Service: Cardiovascular;  Laterality: N/A;  . INGUINAL HERNIA REPAIR  2009   right with mesh.  Dr Marlou Starks  . TEE WITHOUT  CARDIOVERSION N/A 03/06/2016   Procedure: TRANSESOPHAGEAL ECHOCARDIOGRAM (TEE);  Surgeon: Josue Hector, MD;  Location: [facility] ENDOSCOPY;  Service: Cardiovascular;  Laterality: N/A;  . THYROGLOSSAL DUCT CYST  2006   Dr. Wilburn Cornelia   Family History  Adopted: Yes   Social History   Socioeconomic History  . Marital status: Married    Spouse name: Not on file  . Number of children: Not on file  . Years of education: Not on file  . Highest education level: Not on file  Occupational History  . 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 Financial resource strain: Not hard at all  . Food insecurity:    Worry: Never true    Inability: Never true  . Transportation needs:    Medical: No    Non-medical: No  Tobacco Use  . Smoking status: Current Some Day Smoker    Packs/day: 0.50    Years: 40.00    Pack years: 20.00    Types: Cigarettes    Last attempt to quit: 03/05/2016    Years since quitting: 2.4  . Smokeless tobacco: Never Used  . Tobacco comment: .5 pack  Substance and Sexual Activity  . Alcohol use: Yes    Alcohol/week: 21.0 standard drinks    Types: 21 Cans of beer per week    Comment: 3-4 beers nightly  . Drug use: No  . Sexual activity: Not Currently  Lifestyle  . Physical activity:    Days per week: 6 days    Minutes per session: 50 min  . Stress: Not at all  Relationships  . Social connections:    Talks on phone: More than three times a week    Gets together: More than three times a week    Attends religious service: Not on file    Active member of club or organization: Not on file    Attends meetings of clubs or organizations: Not on file    Relationship status: Married  Other Topics Concern  . Not on file  Social History Narrative  . Not on file    Outpatient Encounter Medications as of 08/03/2018  Medication Sig  . amiodarone (PACERONE) 200 MG tablet Take 0.5 tablets (100 mg total) by mouth daily.  Marland Kitchen atorvastatin (LIPITOR) 40 MG tablet TAKE 1  TABLET BY MOUTH EVERY DAY  . calcium carbonate (TUMS - DOSED IN MG ELEMENTAL CALCIUM) 500 MG chewable tablet Chew 1 tablet by mouth daily as needed for heartburn.   . Desmopressin Acetate (NOCDURNA) 27.7 MCG SUBL Place 27.7 mcg under the tongue daily.  . folic acid (FOLVITE) 1 MG tablet TAKE 1 TABLET BY MOUTH EVERY DAY  . furosemide (LASIX) 20 MG tablet Take 1 tablet (20 mg total) by mouth daily as needed for fluid or edema. Use if you become short of breath or experience weight gain.  Marland Kitchen lisinopril (PRINIVIL,ZESTRIL) 10 MG tablet Take 1  tablet (10 mg total) by mouth daily. TAKE 1 TABLET(5 MG) BY MOUTH DAILY  . metoprolol succinate (TOPROL-XL) 50 MG 24 hr tablet TAKE 1 TABLET BY MOUTH EVERY DAY WITH OR IMMEDIATELY FOLLOWING A MEAL  . Multiple Vitamin (MULTIVITAMIN WITH MINERALS) TABS tablet Take 1 tablet by mouth daily.  . Pyridoxine HCl (VITAMIN B6 PO) Take 1 tablet by mouth.  . umeclidinium-vilanterol (ANORO ELLIPTA) 62.5-25 MCG/INH AEPB Inhale 1 puff into the lungs daily.  Alveda Reasons 20 MG TABS tablet TAKE 1 TABLET(20 MG) BY MOUTH DAILY WITH SUPPER   No facility-administered encounter medications on file as of 08/03/2018.     Activities of Daily Living In your present state of health, do you have any difficulty performing the following activities: 08/03/2018  Hearing? N  Vision? N  Difficulty concentrating or making decisions? N  Walking or climbing stairs? N  Dressing or bathing? N  Doing errands, shopping? N  Preparing Food and eating ? N  Using the Toilet? N  In the past six months, have you accidently leaked urine? N  Do you have problems with loss of bowel control? N  Managing your Medications? N  Managing your Finances? N  Housekeeping or managing your Housekeeping? N  Some recent data might be hidden    Patient Care Team: Hoyt Koch, MD as PCP - General (Internal Medicine) Jerline Pain, MD as PCP - Cardiology (Cardiology) Mayans, Shanon Brow (Inactive) as Physician  Assistant (Neurology) Michael Boston, MD as Attending Physician (General Surgery) Jerline Pain, MD as Consulting Physician (Cardiology)   Assessment:   This is a routine wellness examination for Jaidon. Physical assessment deferred to PCP.  Exercise Activities and Dietary recommendations Current Exercise Habits: Home exercise routine, Type of exercise: walking, Time (Minutes): 45, Frequency (Times/Week): 6, Weekly Exercise (Minutes/Week): 270, Intensity: Mild, Exercise limited by: None identified  Diet (meal preparation, eat out, water intake, caffeinated beverages, dairy products, fruits and vegetables): in general, a "healthy" diet  , well balanced   Reviewed heart healthy diet. Encouraged patient to increase daily water and healthy fluid intake.  Goals    . Patient Stated     I will increase the amount of water I drink. I want to play golf and go fishing more often.       Fall Risk Fall Risk  08/03/2018 07/27/2017 02/20/2016  Falls in the past year? 0 No No  Number falls in past yr: 0 - -    Depression Screen PHQ 2/9 Scores 08/03/2018 07/27/2017 02/20/2016  PHQ - 2 Score 0 0 0    Cognitive Function       Ad8 score reviewed for issues:  Issues making decisions: no  Less interest in hobbies / activities: no  Repeats questions, stories (family complaining): no  Trouble using ordinary gadgets (microwave, computer, phone):no  Forgets the month or year: no  Mismanaging finances: no  Remembering appts: no  Daily problems with thinking and/or memory: no Ad8 score is= 0  Immunization History  Administered Date(s) Administered  . Influenza, High Dose Seasonal PF 01/13/2018  . Pneumococcal Conjugate-13 05/06/2016  . Pneumococcal Polysaccharide-23 07/27/2017  . Tdap 02/27/2014   Screening Tests Health Maintenance  Topic Date Due  . INFLUENZA VACCINE  10/16/2018  . COLONOSCOPY  07/03/2020  . TETANUS/TDAP  02/28/2024  . Hepatitis C Screening  Completed  . PNA vac  Low Risk Adult  Completed       Plan:    Reviewed health maintenance screenings with patient today  and relevant education, vaccines, and/or referrals were provided.   Continue to eat heart healthy diet (full of fruits, vegetables, whole grains, lean protein, water--limit salt, fat, and sugar intake) and increase physical activity as tolerated.  Continue doing brain stimulating activities (puzzles, reading, adult coloring books, staying active) to keep memory sharp.   I have personally reviewed and noted the following in the patient's chart:   . Medical and social history . Use of alcohol, tobacco or illicit drugs  . Current medications and supplements . Functional ability and status . Nutritional status . Physical activity . Advanced directives . List of other physicians . Screenings to include cognitive, depression, and falls . Referrals and appointments  In addition, I have reviewed and discussed with patient certain preventive protocols, quality metrics, and best practice recommendations. A written personalized care plan for preventive services as well as general preventive health recommendations were provided to patient.     Michiel Cowboy, RN  08/03/2018

## 2018-08-03 ENCOUNTER — Ambulatory Visit (INDEPENDENT_AMBULATORY_CARE_PROVIDER_SITE_OTHER): Payer: Medicare HMO | Admitting: *Deleted

## 2018-08-03 DIAGNOSIS — Z0000 Encounter for general adult medical examination without abnormal findings: Secondary | ICD-10-CM | POA: Diagnosis not present

## 2018-08-03 NOTE — Progress Notes (Signed)
Medical screening examination/treatment/procedure(s) were performed by non-physician practitioner and as supervising physician I was immediately available for consultation/collaboration. I agree with above. Elizabeth A Crawford, MD 

## 2018-08-03 NOTE — Patient Instructions (Addendum)
Continue doing brain stimulating activities (puzzles, reading, adult coloring books, staying active) to keep memory sharp.   Continue to eat heart healthy diet (full of fruits, vegetables, whole grains, lean protein, water--limit salt, fat, and sugar intake) and increase physical activity as tolerated.   [name] , Thank you for taking time to come for your Medicare Wellness Visit. I appreciate your ongoing commitment to your health goals. Please review the following plan we discussed and let me know if I can assist you in the future.   These are the goals we discussed: Goals    . Patient Stated     I will increase the amount of water I drink. I want to play golf and go fishing more often.       This is a list of the screening recommended for you and due dates:  Health Maintenance  Topic Date Due  . Flu Shot  10/16/2018  . Colon Cancer Screening  07/03/2020  . Tetanus Vaccine  02/28/2024  .  Hepatitis C: One time screening is recommended by Center for Disease Control  (CDC) for  adults born from [birth year] through 1964.   Completed  . Pneumonia vaccines  Completed   LinkMoves[employer] LiveWell Line at [phone]  1-800-QUIT-NOW (641)625-1609). http://bell-fernandez.com/   Steps to Quit Smoking  Smoking tobacco can be bad for your health. It can also affect almost every organ in your body. Smoking puts you and people around you at risk for many serious long-lasting (chronic) diseases. Quitting smoking is hard, but it is one of the best things that you can do for your health. It is never too late to quit. What are the benefits of quitting smoking? When you quit smoking, you lower your risk for getting serious diseases and conditions. They can include:  Lung cancer or lung disease.  Heart disease.  Stroke.  Heart attack.  Not being able to have children (infertility).  Weak bones (osteoporosis) and  broken bones (fractures). If you have coughing, wheezing, and shortness of breath, those symptoms may get better when you quit. You may also get sick less often. If you are pregnant, quitting smoking can help to lower your chances of having a baby of low birth weight. What can I do to help me quit smoking? Talk with your doctor about what can help you quit smoking. Some things you can do (strategies) include:  Quitting smoking totally, instead of slowly cutting back how much you smoke over a period of time.  Going to in-person counseling. You are more likely to quit if you go to many counseling sessions.  Using resources and support systems, such as: ? Database administrator with a Social worker. ? Phone quitlines. ? Careers information officer. ? Support groups or group counseling. ? Text messaging programs. ? Mobile phone apps or applications.  Taking medicines. Some of these medicines may have nicotine in them. If you are pregnant or breastfeeding, do not take any medicines to quit smoking unless your doctor says it is okay. Talk with your doctor about counseling or other things that can help you. Talk with your doctor about using more than one strategy at the same time, such as taking medicines while you are also going to in-person counseling. This can help make quitting easier. What things can I do to make it easier to quit? Quitting smoking might feel very hard at first, but there is a lot that you can do to make it easier. Take these steps:  Talk to your family  and friends. Ask them to support and encourage you.  Call phone quitlines, reach out to support groups, or work with a Social worker.  Ask people who smoke to not smoke around you.  Avoid places that make you want (trigger) to smoke, such as: ? Bars. ? Parties. ? Smoke-break areas at work.  Spend time with people who do not smoke.  Lower the stress in your life. Stress can make you want to smoke. Try these things to help your stress:  ? Getting regular exercise. ? Deep-breathing exercises. ? Yoga. ? Meditating. ? Doing a body scan. To do this, close your eyes, focus on one area of your body at a time from head to toe, and notice which parts of your body are tense. Try to relax the muscles in those areas.  Download or buy apps on your mobile phone or tablet that can help you stick to your quit plan. There are many free apps, such as QuitGuide from the [employer] Office manager for Disease Control and Prevention). You can find more support from smokefree.gov and other websites. This information is not intended to replace advice given to you by your health care provider. Make sure you discuss any questions you have with your health care provider. Document Released: 12/28/2008 Document Revised: 10/30/2015 Document Reviewed: 07/18/2014 Elsevier Interactive Patient Education  2019 [employer].

## 2018-08-16 ENCOUNTER — Telehealth: Payer: Self-pay | Admitting: *Deleted

## 2018-08-16 NOTE — Telephone Encounter (Signed)
    COVID-19 Pre-Screening Questions:  . In the past 7 to 10 days have you had a cough,  shortness of breath, headache, congestion, fever (100 or greater) body aches, chills, sore throat, or sudden loss of taste or sense of smell? . Have you been around anyone with known Covid 19. . Have you been around anyone who is awaiting Covid 19 test results in the past 7 to 10 days? . Have you been around anyone who has been exposed to Covid 19, or has mentioned symptoms of Covid 19 within the past 7 to 10 days?  If you have any concerns/questions about symptoms patients report during screening (either on the phone or at threshold). Contact the provider seeing the patient or DOD for further guidance.  If neither are available contact a member of the leadership team.   Contacted patient via telephone call. All questions to the Covid 19 questionaire were no. Patient has a mask. KB

## 2018-08-18 ENCOUNTER — Other Ambulatory Visit: Payer: Medicare HMO | Admitting: *Deleted

## 2018-08-18 ENCOUNTER — Other Ambulatory Visit: Payer: Self-pay

## 2018-08-18 DIAGNOSIS — Z79899 Other long term (current) drug therapy: Secondary | ICD-10-CM

## 2018-08-18 DIAGNOSIS — I5022 Chronic systolic (congestive) heart failure: Secondary | ICD-10-CM | POA: Diagnosis not present

## 2018-08-18 DIAGNOSIS — E785 Hyperlipidemia, unspecified: Secondary | ICD-10-CM

## 2018-08-18 DIAGNOSIS — I10 Essential (primary) hypertension: Secondary | ICD-10-CM

## 2018-08-18 LAB — COMPREHENSIVE METABOLIC PANEL
ALT: 22 IU/L (ref 0–44)
AST: 23 IU/L (ref 0–40)
Albumin/Globulin Ratio: 2.3 — ABNORMAL HIGH (ref 1.2–2.2)
Albumin: 4.4 g/dL (ref 3.8–4.8)
Alkaline Phosphatase: 68 IU/L (ref 39–117)
BUN/Creatinine Ratio: 15 (ref 10–24)
BUN: 17 mg/dL (ref 8–27)
Bilirubin Total: 0.4 mg/dL (ref 0.0–1.2)
CO2: 22 mmol/L (ref 20–29)
Calcium: 9.5 mg/dL (ref 8.6–10.2)
Chloride: 99 mmol/L (ref 96–106)
Creatinine, Ser: 1.14 mg/dL (ref 0.76–1.27)
GFR calc Af Amer: 77 mL/min/{1.73_m2} (ref >59)
GFR calc non Af Amer: 66 mL/min/{1.73_m2} (ref >59)
Globulin, Total: 1.9 g/dL (ref 1.5–4.5)
Glucose: 78 mg/dL (ref 65–99)
Potassium: 4.7 mmol/L (ref 3.5–5.2)
Sodium: 138 mmol/L (ref 134–144)
Total Protein: 6.3 g/dL (ref 6.0–8.5)

## 2018-08-18 LAB — CBC
Hematocrit: 43.2 % (ref 37.5–51.0)
Hemoglobin: 14.7 g/dL (ref 13.0–17.7)
MCH: 32.7 pg (ref 26.6–33.0)
MCHC: 34 g/dL (ref 31.5–35.7)
MCV: 96 fL (ref 79–97)
Platelets: 244 10*3/uL (ref 150–450)
RBC: 4.5 x10E6/uL (ref 4.14–5.80)
RDW: 12.8 % (ref 11.6–15.4)
WBC: 10 10*3/uL (ref 3.4–10.8)

## 2018-08-18 LAB — T4, FREE: Free T4: 1.46 ng/dL (ref 0.82–1.77)

## 2018-08-18 LAB — LIPID PANEL
Chol/HDL Ratio: 2 ratio (ref 0.0–5.0)
Cholesterol, Total: 124 mg/dL (ref 100–199)
HDL: 62 mg/dL (ref >39)
LDL Calculated: 46 mg/dL (ref 0–99)
Triglycerides: 81 mg/dL (ref 0–149)
VLDL Cholesterol Cal: 16 mg/dL (ref 5–40)

## 2018-08-18 LAB — TSH: TSH: 3.16 u[IU]/mL (ref 0.450–4.500)

## 2018-08-19 ENCOUNTER — Telehealth: Payer: Self-pay

## 2018-08-19 NOTE — Telephone Encounter (Signed)
Notes recorded by Frederik Schmidt, RN on 08/19/2018 at 12:39 PM EDT The patient has been notified of the result and verbalized understanding. All questions (if any) were answered. Frederik Schmidt, RN 08/19/2018 12:39 PM

## 2018-08-19 NOTE — Telephone Encounter (Signed)
-----   Message from Jerline Pain, MD sent at 08/19/2018 12:35 PM EDT ----- Excellent labs, lipids, thyroid.  On amiodarone.  No changes.  Candee Furbish, MD

## 2018-08-22 IMAGING — DX DG CHEST 2V
3 series · 3 of 3 positions shown · non-contrast
Comparison: None.

CLINICAL DATA: Unexplained night sweats and fatigue

EXAM:
CHEST  2 VIEW

[chest pa (1 of 2)]
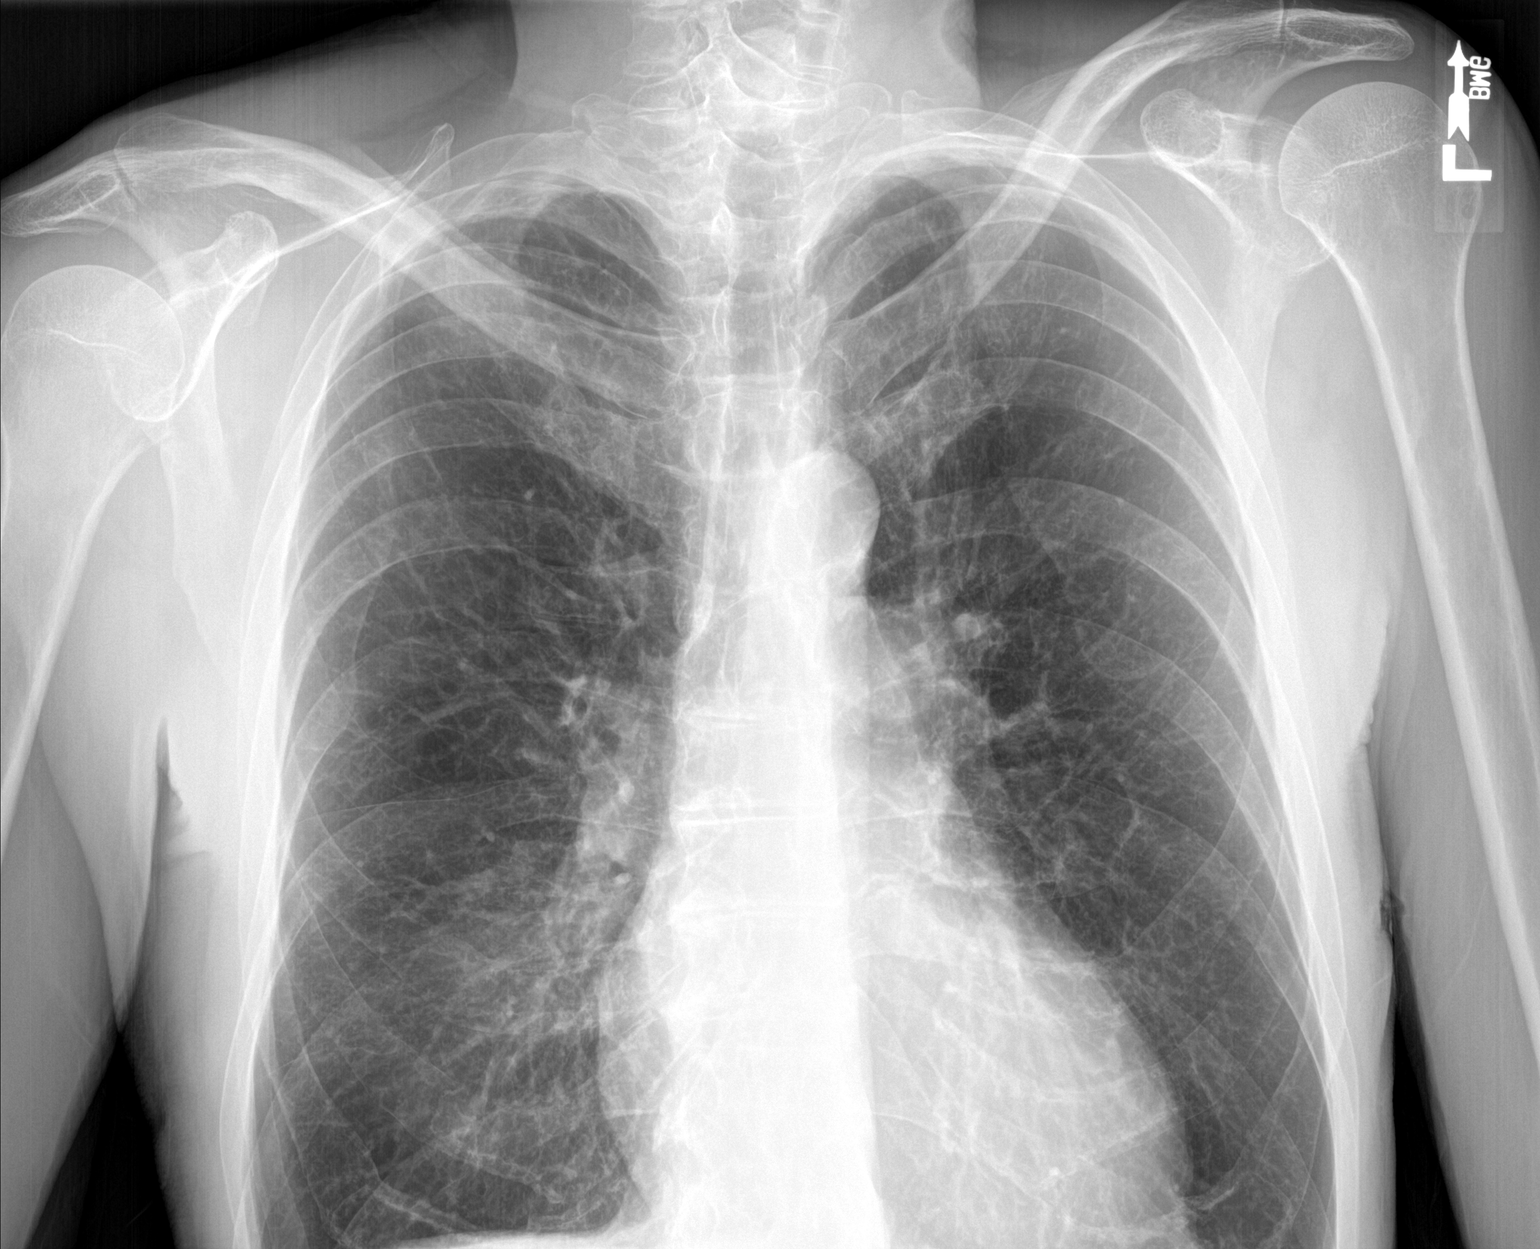

[chest lat]
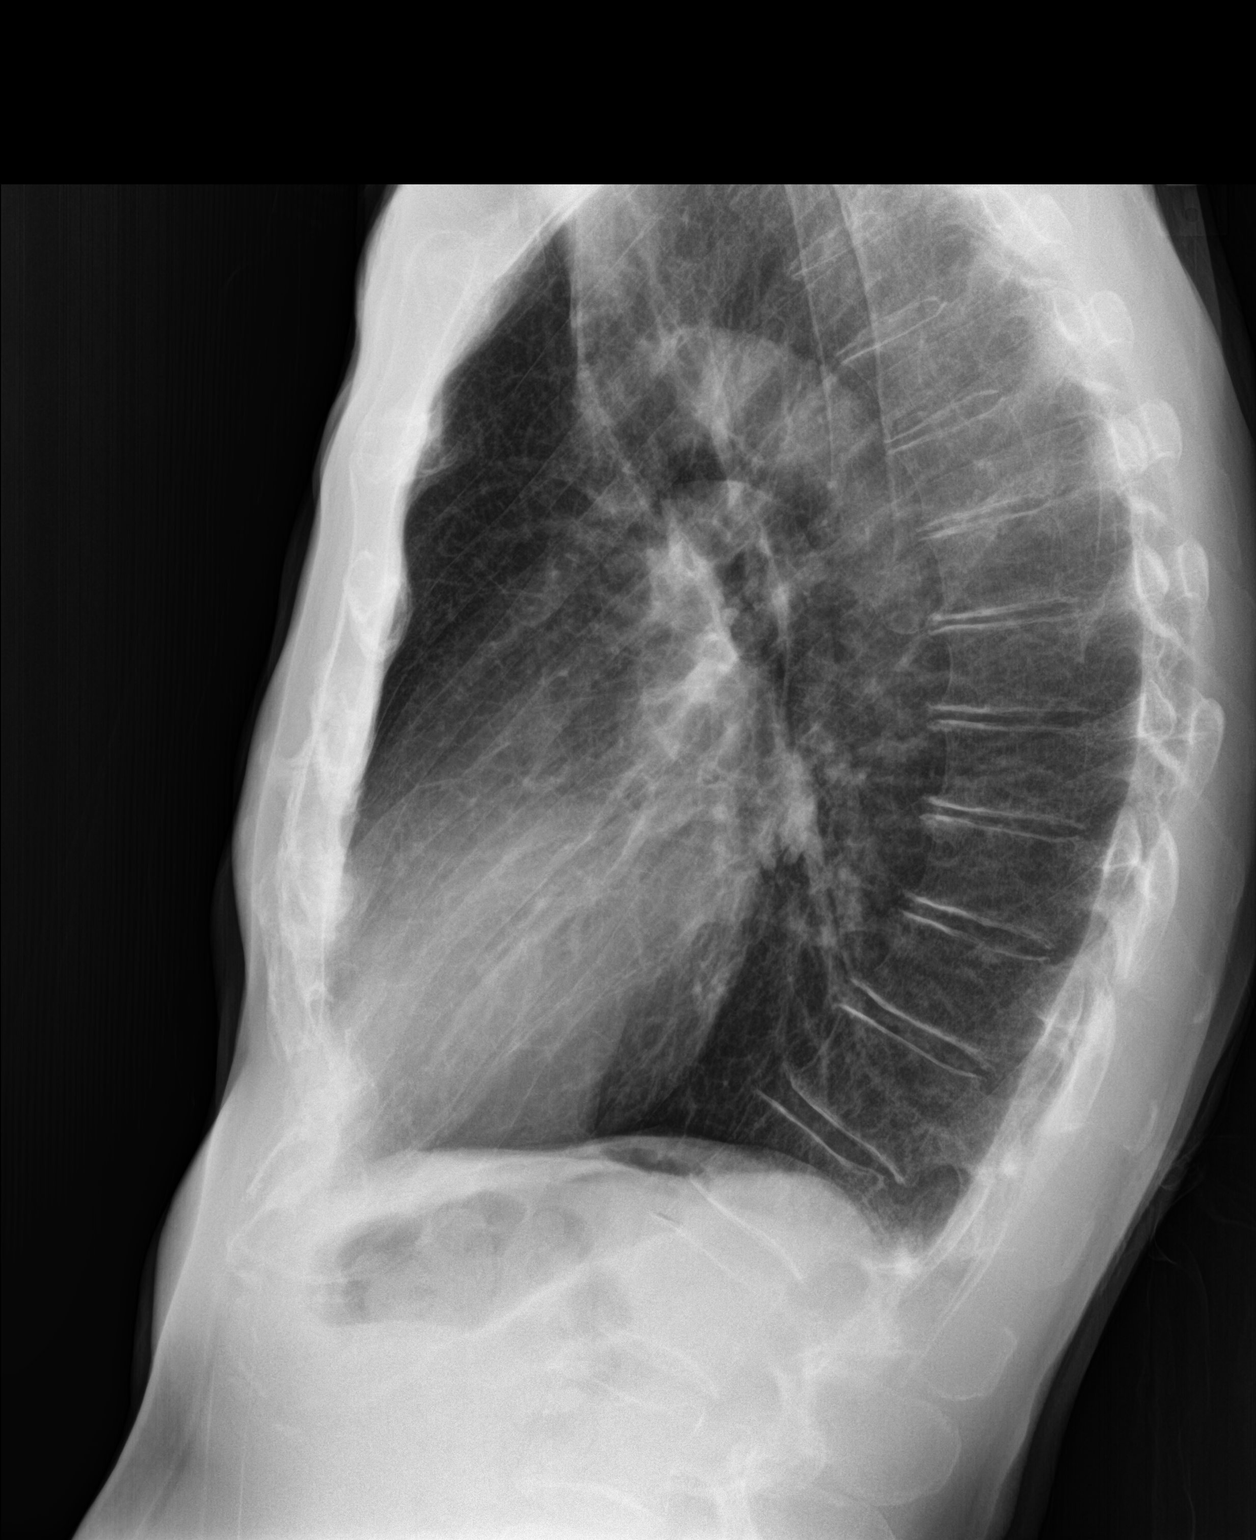

[chest pa (2 of 2)]
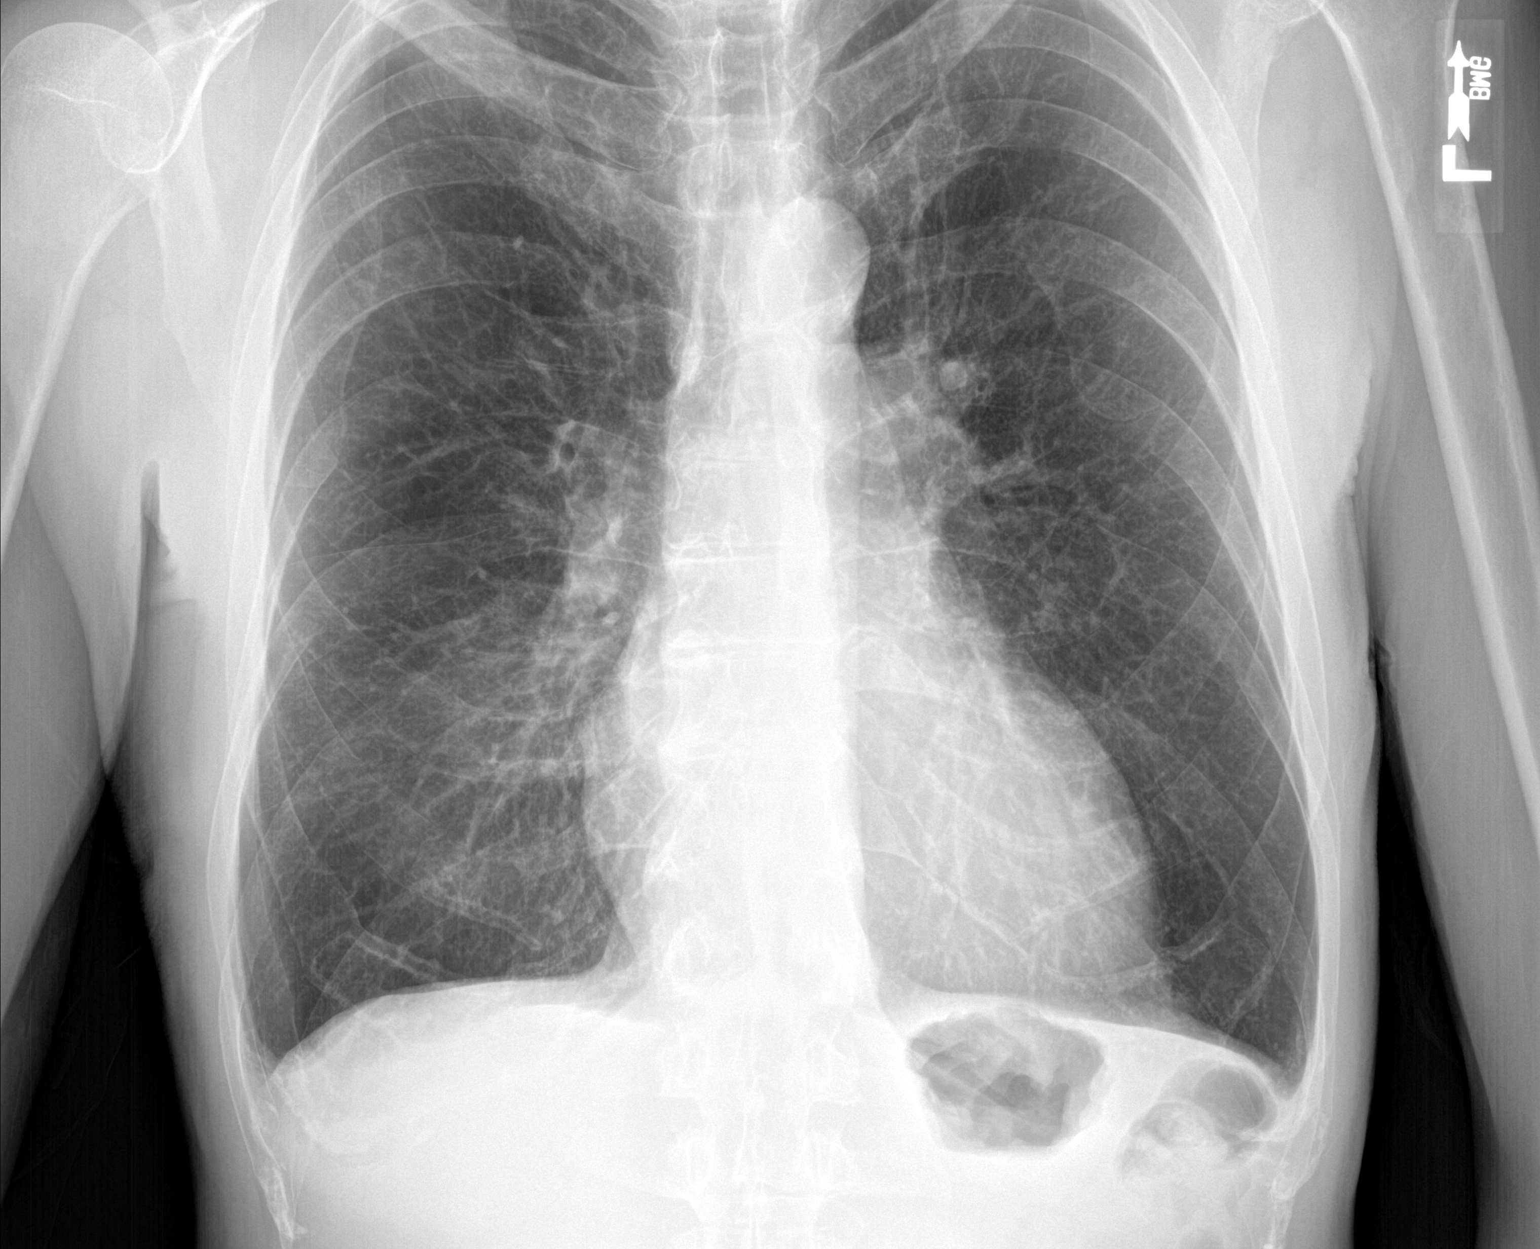

[3 of 3 positions shown; findings below may reference images not displayed]

FINDINGS: The heart size is normal. Emphysematous changes are again noted. No
focal airspace disease is present. There is no edema or effusion.
IMPRESSION: 1. Emphysema.
2. No acute cardiopulmonary disease.

## 2018-10-15 ENCOUNTER — Other Ambulatory Visit: Payer: Self-pay | Admitting: Cardiology

## 2018-10-15 NOTE — Telephone Encounter (Signed)
SCr 1.14 on 08/18/18, age 68, weight 70kg, CrCl 58mL/min, last OV April 2020, afib indication

## 2018-12-14 ENCOUNTER — Other Ambulatory Visit: Payer: Self-pay | Admitting: Cardiology

## 2018-12-14 ENCOUNTER — Other Ambulatory Visit: Payer: Self-pay

## 2018-12-14 MED ORDER — ANORO ELLIPTA 62.5-25 MCG/INH IN AEPB: 1.0000 | INHALATION_SPRAY | Freq: Every day | RESPIRATORY_TRACT | 3 refills | Status: DC

## 2019-01-13 ENCOUNTER — Other Ambulatory Visit: Payer: Self-pay | Admitting: Internal Medicine

## 2019-01-13 ENCOUNTER — Other Ambulatory Visit: Payer: Self-pay

## 2019-01-13 ENCOUNTER — Other Ambulatory Visit: Payer: Self-pay | Admitting: Cardiology

## 2019-01-13 MED ORDER — LISINOPRIL 10 MG PO TABS: 10.0000 mg | ORAL_TABLET | Freq: Every day | ORAL | 0 refills | Status: DC

## 2019-01-27 ENCOUNTER — Ambulatory Visit (INDEPENDENT_AMBULATORY_CARE_PROVIDER_SITE_OTHER): Payer: Medicare HMO

## 2019-01-27 ENCOUNTER — Other Ambulatory Visit: Payer: Self-pay

## 2019-01-27 DIAGNOSIS — Z23 Encounter for immunization: Secondary | ICD-10-CM | POA: Diagnosis not present

## 2019-02-15 ENCOUNTER — Encounter: Payer: Self-pay | Admitting: Cardiology

## 2019-02-18 ENCOUNTER — Ambulatory Visit: Payer: Medicare HMO | Admitting: Cardiology

## 2019-02-24 ENCOUNTER — Ambulatory Visit: Payer: Medicare HMO | Admitting: Cardiology

## 2019-02-24 ENCOUNTER — Encounter: Payer: Self-pay | Admitting: Cardiology

## 2019-02-24 ENCOUNTER — Other Ambulatory Visit: Payer: Self-pay

## 2019-02-24 VITALS — BP 120/60 | HR 65 | Ht 74.0 in | Wt 170.8 lb

## 2019-02-24 DIAGNOSIS — I779 Disorder of arteries and arterioles, unspecified: Secondary | ICD-10-CM | POA: Diagnosis not present

## 2019-02-24 DIAGNOSIS — I480 Paroxysmal atrial fibrillation: Secondary | ICD-10-CM

## 2019-02-24 DIAGNOSIS — I10 Essential (primary) hypertension: Secondary | ICD-10-CM | POA: Diagnosis not present

## 2019-02-24 DIAGNOSIS — J449 Chronic obstructive pulmonary disease, unspecified: Secondary | ICD-10-CM | POA: Diagnosis not present

## 2019-02-24 DIAGNOSIS — Z79899 Other long term (current) drug therapy: Secondary | ICD-10-CM

## 2019-02-24 NOTE — Patient Instructions (Signed)
Medication Instructions:  The current medical regimen is effective;  continue present plan and medications.  *If you need a refill on your cardiac medications before your next appointment, please call your pharmacy*  Follow-Up: At [facility], you and your health needs are our priority.  As part of our continuing mission to provide you with exceptional heart care, we have created designated Provider Care Teams.  These Care Teams include your primary Cardiologist (physician) and Advanced Practice Providers (APPs -  Physician Assistants and Nurse Practitioners) who all work together to provide you with the care you need, when you need it.  Your next appointment:   6 month(s)  The format for your next appointment:   In Person  Provider:   Laura Ingold, NP and 1 year with Dr Skains.  Thank you for choosing [facility]!!      

## 2019-02-24 NOTE — Progress Notes (Signed)
Cardiology Office Note:    Date:  02/24/2019   ID:  [name], DOB Aug 22, 1950, MRN [id number]  PCP:  Hoyt Koch, MD  Cardiologist:  Candee Furbish, MD  Electrophysiologist:  None   Referring MD: Hoyt Koch, *     History of Present Illness:    [name] is a 68 y.o. male here for follow-up of paroxysmal atrial fibrillation, coronary artery disease with dilated cardiomyopathy.  Prior TEE cardioversion 03/06/2016 with amiodarone, seems to be maintaining sinus rhythm.  Ejection fraction improved from 25-45.  Cardiac catheterization 07/06/2010 showed chronic total occlusion of distal RCA with collaterals from right to left.  Medical management.  Has had hypotension in the past.  Also had atrial flutter ablation 10/18/2010.  Hyponatremia, urology  No troubles, no palps, no CP, mild SOB.  Had some episodes of excessive sweating and weakness over the past couple weeks.  Trouble maintaining his weight.-Recommend he discuss this with Dr. Sharlet Salina.  02/24/2019 -Here for atrial fibrillation coronary disease follow-up.  Doing well without any anginal symptoms.  No palpitations.  Amiodarone seems to be holding him steady.  Most recent lab work in June was all normal thyroid liver etc.  Has an upcoming physical with Dr. Sharlet Salina soon. Denies any fevers chills nausea vomiting syncope  Past Medical History:  Diagnosis Date  . Adenomatous colon polyp   . Alcohol use   . Antiphospholipid antibody positive    ???In the past???but not proven according to patient  . Antiphospholipid antibody positive    ??? in the past, but not proven according to the patient.  . Atrial flutter ([place])    atrial flutter ablation,   Dr.Allred October 18, 2010  . BPH (benign prostatic hypertrophy)   . CAD (coronary artery disease)    Catheterization 2005 disease / catheterization July 06, 2010, chronic total occlusion distal RCA with collaterals from right and left side.  Medical therapy  recommended, mild decreased LV function       . Carotid artery disease (HCC)    [id number] R. ICA, [id number] LICA... Doppler.... January, 2010  /  Doppler... in January, 2011 no change  /  doppler1/27/2012...stable  [id number] RICA [id number] LICA  . CHF (congestive heart failure) ([place])   . CRAO (central retinal artery occlusion)   . Ejection fraction     EF 50-55%, echo, March, 2012... inferobasal and distal septal hypokinesis /      60%, echo, November, 2008  . GERD (gastroesophageal reflux disease)   . Hypercholesterolemia   . Hypertension   . Myocardial infarction ([place])   . Nonsustained ventricular tachycardia ([place])   . Palpitations    probable SVT, rate 170,, at home,, lasted one hour,, 2012  . Raynaud's syndrome   . TIA (transient ischemic attack)    Possible small vessel tia in the past.  . Tobacco abuse     Past Surgical History:  Procedure Laterality Date  . CARDIAC CATHETERIZATION  06/27/2003   2005.... mild irregularity of the LAD..( patient had had abnormal Myoview scan)  . CARDIOVERSION N/A 03/06/2016   Procedure: CARDIOVERSION;  Surgeon: Josue Hector, MD;  Location[facility] ENDOSCOPY;  Service: Cardiovascular;  Laterality: N/A;  . INGUINAL HERNIA REPAIR  2009   right with mesh.  Dr Marlou Starks  . TEE WITHOUT CARDIOVERSION N/A 03/06/2016   Procedure: TRANSESOPHAGEAL ECHOCARDIOGRAM (TEE);  Surgeon: Josue Hector, MD;  Location: [place];  Service: Cardiovascular;  Laterality: N/A;  . [place] DUCT CYST  2006  Dr. Wilburn Cornelia    Current Medications: Current Meds  Medication Sig  . amiodarone (PACERONE) 200 MG tablet TAKE 1/2 TABLET(100 MG) BY MOUTH DAILY  . atorvastatin (LIPITOR) 40 MG tablet TAKE 1 TABLET BY MOUTH EVERY DAY  . calcium carbonate (TUMS - DOSED IN MG ELEMENTAL CALCIUM) 500 MG chewable tablet Chew 1 tablet by mouth daily as needed for heartburn.   . Desmopressin Acetate (NOCDURNA) 27.7 MCG SUBL Place 27.7 mcg under the tongue daily.  . folic acid (FOLVITE) 1 MG tablet  TAKE 1 TABLET BY MOUTH EVERY DAY  . furosemide (LASIX) 20 MG tablet Take 1 tablet (20 mg total) by mouth daily as needed for fluid or edema. Use if you become short of breath or experience weight gain.  Marland Kitchen lisinopril (ZESTRIL) 10 MG tablet Take 1 tablet (10 mg total) by mouth daily. NEED OFFICE VISIT FOR FURTHER REFILLS  . metoprolol succinate (TOPROL-XL) 50 MG 24 hr tablet TAKE 1 TABLET BY MOUTH DAILY WITH A MEAL OR IMMEDIATELY FOLLOWING  . Multiple Vitamin (MULTIVITAMIN WITH MINERALS) TABS tablet Take 1 tablet by mouth daily.  . Pyridoxine HCl (VITAMIN B6 PO) Take 1 tablet by mouth.  . umeclidinium-vilanterol (ANORO ELLIPTA) 62.5-25 MCG/INH AEPB Inhale 1 puff into the lungs daily.  Alveda Reasons 20 MG TABS tablet TAKE 1 TABLET(20 MG) BY MOUTH DAILY WITH SUPPER     Allergies:   Cialis [tadalafil], Levitra [vardenafil], and Viagra [sildenafil citrate]   Social History   Socioeconomic History  . Marital status: Married    Spouse name: Not on file  . Number of children: Not on file  . Years of education: Not on file  . Highest education level: Not on file  Occupational History  . Occupation: land Teacher, English as a foreign language retired  Tobacco Use  . Smoking status: Current Some Day Smoker    Packs/day: 0.50    Years: 40.00    Pack years: 20.00    Types: Cigarettes    Last attempt to quit: 03/05/2016    Years since quitting: 2.9  . Smokeless tobacco: Never Used  . Tobacco comment: .5 pack, thinks about quitting but not ready yet  Substance and Sexual Activity  . Alcohol use: Yes    Alcohol/week: 21.0 standard drinks    Types: 21 Cans of beer per week    Comment: 3-4 beers nightly  . Drug use: No  . Sexual activity: Not Currently  Other Topics Concern  . Not on file  Social History Narrative  . Not on file   Social Determinants of Health   Financial Resource Strain: Low Risk   . Difficulty of Paying Living Expenses: Not hard at all  Food Insecurity: No Food Insecurity  . Worried About Ship broker in the Last Year: Never true  . Ran Out of Food in the Last Year: Never true  Transportation Needs: No Transportation Needs  . Lack of Transportation (Medical): No  . Lack of Transportation (Non-Medical): No  Physical Activity: Sufficiently Active  . Days of Exercise per Week: 6 days  . Minutes of Exercise per Session: 50 min  Stress: No Stress Concern Present  . Feeling of Stress : Not at all  Social Connections: Unknown  . Frequency of Communication with Friends and Family: More than three times a week  . Frequency of Social Gatherings with Friends and Family: More than three times a week  . Attends Religious Services: Not on file  . Active Member of Clubs or Organizations: Not on file  .  Attends Archivist Meetings: Not on file  . Marital Status: Married     Family History: The patient's family history is not on file. He was adopted.  ROS:   Please see the history of present illness.     All other systems reviewed and are negative.  EKGs/Labs/Other Studies Reviewed:    The following studies were reviewed today: Prior office notes, lab work including thyroid and liver functions have been normal, EKG  EKG:  EKG is not ordered today.  Prior EKG 07/06/2017 shows heart rate of 53 sinus rhythm borderline intraventricular conduction delay.  Personally reviewed and interpreted  Recent Labs: 08/18/2018: ALT 22; BUN 17; Creatinine, Ser 1.14; Hemoglobin 14.7; Platelets 244; Potassium 4.7; Sodium 138; TSH 3.160  Recent Lipid Panel    Component Value Date/Time   CHOL 124 08/18/2018 1025   TRIG 81 08/18/2018 1025   HDL 62 08/18/2018 1025   CHOLHDL 2.0 08/18/2018 1025   CHOLHDL 2 07/27/2017 0906   VLDL 12.2 07/27/2017 0906   LDLCALC 46 08/18/2018 1025    Physical Exam:    VS:  BP 120/60   Pulse 65   Ht 6\' 2"  (1.88 m)   Wt 170 lb 12.8 oz (77.5 kg)   SpO2 (!) 88%   BMI 21.93 kg/m     Wt Readings from Last 3 Encounters:  02/24/19 170 lb 12.8 oz (77.5 kg)   07/06/18 154 lb 12.8 oz (70.2 kg)  02/03/18 150 lb (68 kg)     GEN: Thin in no acute distress weight however HEENT: Normal, bluish hue to nose, no change NECK: No JVD; No carotid bruits LYMPHATICS: No lymphadenopathy CARDIAC: RRR, no murmurs, rubs, gallops RESPIRATORY:  Clear to auscultation without rales, positive bilateral lower lobe wheezing no rhonchi  ABDOMEN: Soft, non-tender, non-distended MUSCULOSKELETAL:  No edema; No deformity  SKIN: Warm and dry NEUROLOGIC:  Alert and oriented x 3 PSYCHIATRIC:  Normal affect   ASSESSMENT:    1. Paroxysmal atrial fibrillation (HCC)   2. On amiodarone therapy   3. Essential hypertension    PLAN:    In order of problems listed above:  Symptomatic bradycardia - At prior visit decreased amiodarone to 100 mg.  Continue with beta-blocker.  Overall doing well.  No changes made today.  Seems stable.  Paroxysmal atrial fibrillation - Amiodarone-lab work recently done at urology office. -Xarelto, no bleeding.  Hemoglobin reassuring.  Prior atrial flutter ablation - Stable.  No recurrent issues, reassuring  Coronary artery disease - Chronic total occlusion RCA with collaterals.  Prior nuclear stress test September 2015 showed no evidence of significant ischemia.  Continue with medical management.  Discussed again  Bilateral carotid artery disease mild -Less than 39%.  Continue with aggressive secondary prevention, statin.  We may repeat his carotid ultrasound in 2 years.  Hyperlipidemia -Continue with statin therapy high intensity.  Excellent use.  No myalgias.  Dilated cardiomyopathy -Ejection fraction has improved from 20% up to 45%.  Euvolemic.  Consider repeating echocardiogram at later date to continue to help guide therapy.  Doing well.  COPD - Saw Dr. [place] Bells previously 09/05/2016.  I am comfortable with him being on inhalers if necessary.  Wheezing heard on exam.  Nocturia -Has been seen by urology, 24-hour urine and  extensive blood work has been performed.  He has been given nocdura (desmopressin) to help with nightly urination and potentially with hyponatremia.  No recent issues today.  Has increased weight  Medication Adjustments/Labs and Tests Ordered: Current medicines  are reviewed at length with the patient today.  Concerns regarding medicines are outlined above.  No orders of the defined types were placed in this encounter.  No orders of the defined types were placed in this encounter.   Patient Instructions  Medication Instructions:  The current medical regimen is effective;  continue present plan and medications.  *If you need a refill on your cardiac medications before your next appointment, please call your pharmacy*  Follow-Up: At [facility], you and your health needs are our priority.  As part of our continuing mission to provide you with exceptional heart care, we have created designated Provider Care Teams.  These Care Teams include your primary Cardiologist (physician) and Advanced Practice Providers (APPs -  Physician Assistants and Nurse Practitioners) who all work together to provide you with the care you need, when you need it.  Your next appointment:   6 month(s)  The format for your next appointment:   In Person  Provider:   Cecilie Kicks, NP and 1 year with Dr [name].  Thank you for choosing [facility]!!        Signed, Candee Furbish, MD  02/24/2019 12:13 PM    [place]

## 2019-03-29 DIAGNOSIS — L821 Other seborrheic keratosis: Secondary | ICD-10-CM | POA: Diagnosis not present

## 2019-03-29 DIAGNOSIS — L720 Epidermal cyst: Secondary | ICD-10-CM | POA: Diagnosis not present

## 2019-03-29 DIAGNOSIS — L570 Actinic keratosis: Secondary | ICD-10-CM | POA: Diagnosis not present

## 2019-03-31 ENCOUNTER — Other Ambulatory Visit: Payer: Self-pay | Admitting: Internal Medicine

## 2019-03-31 ENCOUNTER — Telehealth: Payer: Self-pay | Admitting: Internal Medicine

## 2019-03-31 MED ORDER — LISINOPRIL 10 MG PO TABS: 10.0000 mg | ORAL_TABLET | Freq: Every day | ORAL | 0 refills | Status: DC

## 2019-03-31 NOTE — Telephone Encounter (Signed)
Pt called and made an appt but it is not until 1/21  He is completely out and wants ot know if he can get one refill  CB# [phone]

## 2019-03-31 NOTE — Telephone Encounter (Signed)
Already sent.

## 2019-03-31 NOTE — Telephone Encounter (Signed)
Requested medication (s) are due for refill today: yes  Requested medication (s) are on the active medication list: yes  Last refill:  01/13/2019  Future visit scheduled: no  Notes to clinic:  Spoke with  patient and he will contact Dr Sharlet Salina office to schedule appointment . Patient has been out of medication for 1 week. Please advise    Requested Prescriptions  Pending Prescriptions Disp Refills   lisinopril (ZESTRIL) 10 MG tablet 30 tablet 0    Sig: Take 1 tablet (10 mg total) by mouth daily. NEED OFFICE VISIT FOR FURTHER REFILLS      Cardiovascular:  ACE Inhibitors Failed - 03/31/2019  2:16 PM      Failed - Cr in normal range and within 180 days    Creat  Date Value Ref Range Status  02/22/2016 1.11 0.70 - 1.25 mg/dL Final    Comment:      For patients > or = 68 years of age: The upper reference limit for Creatinine is approximately 13% higher for people identified as African-American.      Creatinine, Ser  Date Value Ref Range Status  08/18/2018 1.14 0.76 - 1.27 mg/dL Final          Failed - K in normal range and within 180 days    Potassium  Date Value Ref Range Status  08/18/2018 4.7 3.5 - 5.2 mmol/L Final          Failed - Valid encounter within last 6 months    Recent Outpatient Visits           1 year ago Proteinuria, unspecified type   [place], Elizabeth A, MD   1 year ago Centrilobular emphysema [facility])   [facility] -Chuck Hint, MD   1 year ago Routine general medical examination at a health care facility   [place], Elizabeth A, MD   3 years ago Alcohol abuse   [place], MD   3 years ago Paroxysmal atrial fibrillation [facility])   [facility] at [place], [place] [facility], [place] - Patient is not pregnant      Passed - Last BP in normal range    BP Readings from Last  1 Encounters:  02/24/19 120/60

## 2019-03-31 NOTE — Telephone Encounter (Signed)
Pt needs a refill on lisinopril. [employer] garden. Pt said pharm has called

## 2019-04-07 ENCOUNTER — Ambulatory Visit (INDEPENDENT_AMBULATORY_CARE_PROVIDER_SITE_OTHER): Payer: Medicare HMO | Admitting: Internal Medicine

## 2019-04-07 ENCOUNTER — Other Ambulatory Visit: Payer: Self-pay

## 2019-04-07 ENCOUNTER — Encounter: Payer: Self-pay | Admitting: Internal Medicine

## 2019-04-07 DIAGNOSIS — I5022 Chronic systolic (congestive) heart failure: Secondary | ICD-10-CM

## 2019-04-07 DIAGNOSIS — I10 Essential (primary) hypertension: Secondary | ICD-10-CM

## 2019-04-07 DIAGNOSIS — F1010 Alcohol abuse, uncomplicated: Secondary | ICD-10-CM | POA: Diagnosis not present

## 2019-04-07 DIAGNOSIS — J432 Centrilobular emphysema: Secondary | ICD-10-CM | POA: Diagnosis not present

## 2019-04-07 DIAGNOSIS — F17200 Nicotine dependence, unspecified, uncomplicated: Secondary | ICD-10-CM | POA: Diagnosis not present

## 2019-04-07 MED ORDER — PREDNISONE 20 MG PO TABS: 40.0000 mg | ORAL_TABLET | Freq: Every day | ORAL | 0 refills | Status: DC

## 2019-04-07 MED ORDER — DOXYCYCLINE HYCLATE 100 MG PO TABS: 100.0000 mg | ORAL_TABLET | Freq: Two times a day (BID) | ORAL | 0 refills | Status: DC

## 2019-04-07 MED ORDER — ALBUTEROL SULFATE HFA 108 (90 BASE) MCG/ACT IN AERS: 2.0000 | INHALATION_SPRAY | RESPIRATORY_TRACT | 2 refills | Status: DC | PRN

## 2019-04-07 MED ORDER — LISINOPRIL 10 MG PO TABS: ORAL_TABLET | ORAL | 3 refills | Status: DC

## 2019-04-07 NOTE — Assessment & Plan Note (Signed)
With flare today, rx prednisone and doxycycline and albuterol inhaler. Advised to stop smoking. He is interested in lung cancer screening so referral done for that. If no improvement needs visit again. Continue anoro.

## 2019-04-07 NOTE — Assessment & Plan Note (Signed)
Symptoms do not sound consistent with flare at this time.

## 2019-04-07 NOTE — Progress Notes (Signed)
Virtual Visit via Audio Note  I connected with [name] on 04/07/19 at  9:40 AM EST by an audio-only enabled telemedicine application and verified that I am speaking with the correct person using two identifiers.  The patient and the provider were at separate locations throughout the entire encounter.   I discussed the limitations of evaluation and management by telemedicine and the availability of in person appointments. The patient expressed understanding and agreed to proceed. The patient and the provider were the only parties present for the visit unless noted in HPI below.  History of Present Illness: The patient is a 69 y.o. man with visit for cough and SOB for several weeks. Started when grand child was staying with them. He has not recovered well. Using his anoro daily for his COPD. Still smoking but down to 3 cigarettes per day. Denies fevers or chills. Denies headaches. Having some SOB with coughing fits and using wife's albuterol inhaler up to twice a day. Has not been worsening but not improving. Currently trying mucinex otc but this has not helped much. Overall it is stable.   Observations/Objective: voice strong, no dyspnea or coughing during visit, A and O times 3  Assessment and Plan: See problem oriented charting  Follow Up Instructions: rx prednisone, doxycycline, albuterol inhaler  Visit time 12 minutes in face to face communication with patient and coordination of care.  I discussed the assessment and treatment plan with the patient. The patient was provided an opportunity to ask questions and all were answered. The patient agreed with the plan and demonstrated an understanding of the instructions.   The patient was advised to call back or seek an in-person evaluation if the symptoms worsen or if the condition fails to improve as anticipated.  Hoyt Koch, MD

## 2019-04-11 DIAGNOSIS — Z20828 Contact with and (suspected) exposure to other viral communicable diseases: Secondary | ICD-10-CM | POA: Diagnosis not present

## 2019-04-14 ENCOUNTER — Other Ambulatory Visit: Payer: Self-pay | Admitting: *Deleted

## 2019-04-14 DIAGNOSIS — F17210 Nicotine dependence, cigarettes, uncomplicated: Secondary | ICD-10-CM

## 2019-04-14 DIAGNOSIS — Z87891 Personal history of nicotine dependence: Secondary | ICD-10-CM

## 2019-04-15 ENCOUNTER — Ambulatory Visit: Payer: Medicare HMO

## 2019-04-23 ENCOUNTER — Ambulatory Visit: Payer: Medicare HMO | Attending: Internal Medicine

## 2019-04-23 DIAGNOSIS — Z23 Encounter for immunization: Secondary | ICD-10-CM | POA: Insufficient documentation

## 2019-04-23 NOTE — Progress Notes (Signed)
   Covid-19 Vaccination Clinic  Name:  [name]    MRN: [id number] DOB: 1950/06/09  04/23/2019  [name] was observed post Covid-19 immunization for 15 minutes without incidence. He was provided with Vaccine Information Sheet and instruction to access the V-Safe system.   [name] was instructed to call 911 with any severe reactions post vaccine: Marland Kitchen Difficulty breathing  . Swelling of your face and throat  . A fast heartbeat  . A bad rash all over your body  . Dizziness and weakness    Immunizations Administered    Name Date Dose VIS Date Route   Pfizer COVID-19 Vaccine 04/23/2019 12:01 AM 0.3 mL 02/25/2019 Intramuscular   Manufacturer: [place]   Lot: [id number]   [place]: [id number]

## 2019-04-27 ENCOUNTER — Encounter: Payer: Self-pay | Admitting: Acute Care

## 2019-04-27 ENCOUNTER — Other Ambulatory Visit: Payer: Self-pay

## 2019-04-27 ENCOUNTER — Ambulatory Visit (INDEPENDENT_AMBULATORY_CARE_PROVIDER_SITE_OTHER)
Admission: RE | Admit: 2019-04-27 | Discharge: 2019-04-27 | Disposition: A | Discharge status: Home / Self Care | Payer: Medicare HMO | Source: Ambulatory Visit | Attending: Acute Care | Admitting: Acute Care

## 2019-04-27 ENCOUNTER — Ambulatory Visit (INDEPENDENT_AMBULATORY_CARE_PROVIDER_SITE_OTHER): Payer: Medicare HMO | Admitting: Acute Care

## 2019-04-27 VITALS — BP 140/70 | HR 60 | Temp 97.5°F | Ht 74.0 in | Wt 166.4 lb

## 2019-04-27 DIAGNOSIS — F17210 Nicotine dependence, cigarettes, uncomplicated: Secondary | ICD-10-CM | POA: Diagnosis not present

## 2019-04-27 DIAGNOSIS — Z87891 Personal history of nicotine dependence: Secondary | ICD-10-CM | POA: Diagnosis not present

## 2019-04-27 NOTE — Progress Notes (Signed)
Shared Decision Making Visit Lung Cancer Screening Program [phone])   Eligibility:  Age 69 y.o.  Pack Years Smoking History Calculation 51 pack year smoking history (# packs/per year x # years smoked)  Recent History of coughing up blood  no  Unexplained weight loss? no ( >Than 15 pounds within the last 6 months )  Prior History Lung / other cancer no (Diagnosis within the last 5 years already requiring surveillance chest CT Scans).  Smoking Status Current Smoker  Former Smokers: Years since quit: NA  Quit Date: NA  Visit Components:  Discussion included one or more decision making aids. yes  Discussion included risk/benefits of screening. yes  Discussion included potential follow up diagnostic testing for abnormal scans. yes  Discussion included meaning and risk of over diagnosis. yes  Discussion included meaning and risk of False Positives. yes  Discussion included meaning of total radiation exposure. yes  Counseling Included:  Importance of adherence to annual lung cancer LDCT screening. yes  Impact of comorbidities on ability to participate in the program. yes  Ability and willingness to under diagnostic treatment. yes  Smoking Cessation Counseling:  Current Smokers:   Discussed importance of smoking cessation. yes  Information about tobacco cessation classes and interventions provided to patient. yes  Patient provided with "ticket" for LDCT Scan. yes  Symptomatic Patient. no  Counseling  Diagnosis Code: Tobacco Use Z72.0  Asymptomatic Patient yes  Counseling (Intermediate counseling: > three minutes counseling) [id number]  Former Smokers:   Discussed the importance of maintaining cigarette abstinence. yes  Diagnosis Code: Personal History of Nicotine Dependence. [id number]  Information about tobacco cessation classes and interventions provided to patient. Yes  Patient provided with "ticket" for LDCT Scan. yes  Written Order for Lung Cancer  Screening with LDCT placed in Epic. Yes (CT Chest Lung Cancer Screening Low Dose W/O CM) [id number] Z12.2-Screening of respiratory organs Z87.891-Personal history of nicotine dependence  BP 140/70 (BP Location: Left Arm, Cuff Size: Normal)   Pulse 60   Temp (!) 97.5 F (36.4 C) (Oral)   Ht 6\' 2"  (1.88 m)   Wt 166 lb 6.4 oz (75.5 kg)   SpO2 100%   BMI 21.36 kg/m   I have spent 25 minutes of face to face time with [name] discussing the risks and benefits of lung cancer screening. We viewed a power point together that explained in detail the above noted topics. We paused at intervals to allow for questions to be asked and answered to ensure understanding.We discussed that the single most powerful action that he can take to decrease his risk of developing lung cancer is to quit smoking. We discussed whether or not he is ready to commit to setting a quit date. We discussed options for tools to aid in quitting smoking including nicotine replacement therapy, non-nicotine medications, support groups, Quit Smart classes, and behavior modification. We discussed that often times setting smaller, more achievable goals, such as eliminating 1 cigarette a day for a week and then 2 cigarettes a day for a week can be helpful in slowly decreasing the number of cigarettes smoked. This allows for a sense of accomplishment as well as providing a clinical benefit. I gave him the " Be Stronger Than Your Excuses" card with contact information for community resources, classes, free nicotine replacement therapy, and access to mobile apps, text messaging, and on-line smoking cessation help. I have also given him my card and contact information in the event he needs to contact me. We discussed the  time and location of the scan, and that either Doroteo Glassman RN or I will call with the results within 24-48 hours of receiving them. I have offered him  a copy of the power point we viewed  as a resource in the event they need  reinforcement of the concepts we discussed today in the office. The patient verbalized understanding of all of  the above and had no further questions upon leaving the office. They have my contact information in the event they have any further questions.  I spent 4 minutes counseling on smoking cessation and the health risks of continued tobacco abuse.  I explained to the patient that there has been a high incidence of coronary artery disease noted on these exams. I explained that this is a non-gated exam therefore degree or severity cannot be determined. This patient is currently on statin therapy. I have asked the patient to follow-up with their PCP regarding any incidental finding of coronary artery disease and management with diet or medication as their PCP  feels is clinically indicated. The patient verbalized understanding of the above and had no further questions upon completion of the visit.   Pt. Would like referral to Pulmonary for management of his suspected COPD. He has never had PFT's.  We will refer to Pulmonary   Magdalen Spatz, NP 04/27/2019 11:12 AM

## 2019-04-27 NOTE — Patient Instructions (Signed)
Thank you for participating in the [place] Scre[facility]. It was our pleasure to meet you today. We will call you with the results of your scan within the next few days. Your scan will be assigned a Lung RADS category score by the physicians reading the scans.  This Lung RADS score determines follow up scanning.  See below for description of categories, and follow up screening recommendations. We will be in touch to schedule your follow up screening annually or based on recommendations of our providers. We will fax a copy of your scan results to your Primary Care Physician, or the physician who referred you to the program, to ensure they have the results. Please call the office if you have any questions or concerns regarding your scanning experience or results.  Our office number is [phone]. Please speak with Denise Phelps, RN. She is our Lung Cancer Screening RN. If she is unavailable when you call, please have the office staff send her a message. She will return your call at her earliest convenience. Remember, if your scan is normal, we will scan you annually as long as you continue to meet the criteria for the program. (Age 55-77, Current smoker or smoker who has quit within the last 15 years). If you are a smoker, remember, quitting is the single most powerful action that you can take to decrease your risk of lung cancer and other pulmonary, breathing related problems. We know quitting is hard, and we are here to help.  Please let us know if there is anything we can do to help you meet your goal of quitting. If you are a former smoker, congratulations. We are proud of you! Remain smoke free! Remember you can refer friends or family members through the number above.  We will screen them to make sure they meet criteria for the program. Thank you for helping us take better care of you by participating in Lung Screening.  Lung RADS Categories:  Lung RADS 1: no nodules  or definitely non-concerning nodules.  Recommendation is for a repeat annual scan in 12 months.  Lung RADS 2:  nodules that are non-concerning in appearance and behavior with a very low likelihood of becoming an active cancer. Recommendation is for a repeat annual scan in 12 months.  Lung RADS 3: nodules that are probably non-concerning , includes nodules with a low likelihood of becoming an active cancer.  Recommendation is for a 6-month repeat screening scan. Often noted after an upper respiratory illness. We will be in touch to make sure you have no questions, and to schedule your 6-month scan.  Lung RADS 4 A: nodules with concerning findings, recommendation is most often for a follow up scan in 3 months or additional testing based on our provider's assessment of the scan. We will be in touch to make sure you have no questions and to schedule the recommended 3 month follow up scan.  Lung RADS 4 B:  indicates findings that are concerning. We will be in touch with you to schedule additional diagnostic testing based on our provider's  assessment of the scan.   

## 2019-04-28 DIAGNOSIS — L720 Epidermal cyst: Secondary | ICD-10-CM | POA: Diagnosis not present

## 2019-05-04 ENCOUNTER — Telehealth: Payer: Self-pay | Admitting: Acute Care

## 2019-05-04 DIAGNOSIS — F17210 Nicotine dependence, cigarettes, uncomplicated: Secondary | ICD-10-CM

## 2019-05-04 DIAGNOSIS — Z87891 Personal history of nicotine dependence: Secondary | ICD-10-CM

## 2019-05-04 NOTE — Telephone Encounter (Signed)
I have called [name] with the results of his low-dose CT.  I explained that his scan was read as a lung RADS 3. Lung  RADS 3, nodules that are probably benign findings, short term follow up suggested: includes nodules with a low likelihood of becoming a clinically active cancer. Radiology recommends a 6 month repeat LDCT follow up. I reviewed the scan with Dr. Ernest Mallick as there is a left upper lobe pulmonary nodule that is spiculated in appearance and we feel warrants a 52-month follow-up. I have explained this to the patient.  He is in agreement with a 73-month follow-up.  Additionally we have referred him to the pulmonary practice for consultation with one of the pulmonologists for evaluation of his COPD.  Denise please schedule follow-up low-dose CT for May 2021. Please fax results to his PCP  Thank you

## 2019-05-04 NOTE — Telephone Encounter (Signed)
[name], pt is calling for LDCT results from 04/27/19. Pt is new to screening program. CT was a LR3. Can you call him with results?

## 2019-05-05 NOTE — Progress Notes (Signed)
These results have been called to the patient. See telephone note dated 05/04/2019.  Langley Gauss, please place follow up scan for 3 months>> 07/2019. I reviewed the scn with Dr. Carlis Abbott, and we both feel it warrants a 3 month follow up rather than a 6 month follow up. Thanks so much

## 2019-05-06 ENCOUNTER — Ambulatory Visit: Payer: Medicare HMO

## 2019-05-09 NOTE — Telephone Encounter (Signed)
Results faxed to PCP. Order placed for 3 mth f/u low dose chest ct.

## 2019-05-09 NOTE — Telephone Encounter (Signed)
See other telephone note from 05/04/19. Results faxed to PCP. Order placed for 3 mth f/u low dose chest ct.

## 2019-05-18 ENCOUNTER — Ambulatory Visit: Payer: Medicare HMO | Attending: Internal Medicine

## 2019-05-18 DIAGNOSIS — Z23 Encounter for immunization: Secondary | ICD-10-CM | POA: Insufficient documentation

## 2019-05-18 NOTE — Progress Notes (Signed)
   Covid-19 Vaccination Clinic  Name:  [name]    MRN: [id number] DOB: [birth year]  05/18/2019  [name] was observed post Covid-19 immunization for 15 minutes without incident. He was provided with Vaccine Information Sheet and instruction to access the V-Safe system.   [name] was instructed to call 911 with any severe reactions post vaccine: Marland Kitchen Difficulty breathing  . Swelling of face and throat  . A fast heartbeat  . A bad rash all over body  . Dizziness and weakness   Immunizations Administered    Name Date Dose VIS Date Route   Pfizer COVID-19 Vaccine 05/18/2019  8:58 AM 0.3 mL 02/25/2019 Intramuscular   Manufacturer: [place]   Lot: [id number]   [place]: [id number]

## 2019-06-04 ENCOUNTER — Other Ambulatory Visit: Payer: Self-pay | Admitting: Cardiology

## 2019-06-06 NOTE — Telephone Encounter (Signed)
Prescription refill request for Xarelto received.   Last office visit: 02/24/2019, [place] Weight: 75.5 kg Age: 69 y.o. Scr: 1.14, 08/18/2018 CrCl: 67 ml/min   Prescription refill sent.

## 2019-06-12 ENCOUNTER — Other Ambulatory Visit: Payer: Self-pay | Admitting: Cardiology

## 2019-06-13 ENCOUNTER — Telehealth: Payer: Self-pay | Admitting: Internal Medicine

## 2019-06-13 DIAGNOSIS — I2510 Atherosclerotic heart disease of native coronary artery without angina pectoris: Secondary | ICD-10-CM

## 2019-06-13 NOTE — Chronic Care Management (AMB) (Signed)
  Chronic Care Management   Note  06/13/2019 Name: [name] MRN: [id number] DOB: [birth year]  [name] is a 69 y.o. year old male who is a primary care patient of Hoyt Koch, MD. I reached out to [name] by phone today in response to a referral sent by [name] Care's PCP, Hoyt Koch, MD.   [name] was given information about Chronic Care Management services today including:  1. CCM service includes personalized support from designated clinical staff supervised by his physician, including individualized plan of care and coordination with other care providers 2. 24/7 contact phone numbers for assistance for urgent and routine care needs. 3. Service will only be billed when office clinical staff spend 20 minutes or more in a month to coordinate care. 4. Only one practitioner may furnish and bill the service in a calendar month. 5. The patient may stop CCM services at any time (effective at the end of the month) by phone call to the office staff.   Patient agreed to services and verbal consent obtained.   Follow up plan:   Raynicia Dukes [facility]

## 2019-07-04 DIAGNOSIS — R972 Elevated prostate specific antigen [PSA]: Secondary | ICD-10-CM | POA: Diagnosis not present

## 2019-07-05 ENCOUNTER — Telehealth: Payer: Self-pay | Admitting: Cardiology

## 2019-07-05 ENCOUNTER — Other Ambulatory Visit: Payer: Self-pay

## 2019-07-05 ENCOUNTER — Encounter: Payer: Self-pay | Admitting: Pharmacist

## 2019-07-05 ENCOUNTER — Other Ambulatory Visit: Payer: Self-pay | Admitting: Internal Medicine

## 2019-07-05 ENCOUNTER — Ambulatory Visit: Payer: Medicare HMO | Admitting: Pharmacist

## 2019-07-05 DIAGNOSIS — I480 Paroxysmal atrial fibrillation: Secondary | ICD-10-CM

## 2019-07-05 DIAGNOSIS — I10 Essential (primary) hypertension: Secondary | ICD-10-CM

## 2019-07-05 DIAGNOSIS — F17200 Nicotine dependence, unspecified, uncomplicated: Secondary | ICD-10-CM

## 2019-07-05 DIAGNOSIS — E7800 Pure hypercholesterolemia, unspecified: Secondary | ICD-10-CM

## 2019-07-05 MED ORDER — STIOLTO RESPIMAT 2.5-2.5 MCG/ACT IN AERS: 2.0000 | INHALATION_SPRAY | Freq: Every day | RESPIRATORY_TRACT | 3 refills | Status: DC

## 2019-07-05 MED ORDER — METOPROLOL SUCCINATE ER 50 MG PO TB24: ORAL_TABLET | ORAL | 2 refills | Status: DC

## 2019-07-05 MED ORDER — AMIODARONE HCL 200 MG PO TABS: ORAL_TABLET | ORAL | 2 refills | Status: DC

## 2019-07-05 NOTE — Telephone Encounter (Signed)
Pt's medications were sent to pt's pharmacy as requested. Confirmation received.  

## 2019-07-05 NOTE — Telephone Encounter (Signed)
New message   Pt c/o medication issue:  1. Name of Medication: xarelto 20 mg, metoprolol succinate (TOPROL-XL) 50 MG 24 hr tablet   amiodarone (PACERONE) 200 MG tablet  2. How are you currently taking this medication (dosage and times per day)? As written  3. Are you having a reaction (difficulty breathing--STAT)?no   4. What is your medication issue? Per [facility] need new prescription for these medications sent to their pharmacy.

## 2019-07-05 NOTE — Chronic Care Management (AMB) (Signed)
Chronic Care Management Pharmacy  Name: [name]  MRN: [id number] DOB: [birth year]  Chief Complaint/ HPI  [name],  69 y.o. , male presents for their Initial CCM visit with the clinical pharmacist via telephone due to COVID-19 Pandemic.  PCP : Hoyt Koch, MD  Their chronic conditions include: HTN, CAD (no stents), Afib, CHF, HLD, GERD, BPH, tobacco use  Taking granddaughter during the week. Wife is retired Marine scientist.  Office Visits: 04/07/19 Dr Sharlet Salina VV: cough/SOB, rx'd prednisone and doxycyline for COPD exacerbation. Referred for lung cancer screening  Consult Visit: 04/27/19 NP Elie Confer (pulmonary): lung cancer screening, smoking cessation counseling, referred to pulmonary for PFTs  02/24/19 Dr [name] (cardiology): bradycardia - improved after amiodarone decreased to 100 mg. CAD - stress test 2015 no evidence of ischemia, medical mgmt only.   Medications: Outpatient Encounter Medications as of 07/05/2019  Medication Sig  . albuterol (VENTOLIN HFA) 108 (90 Base) MCG/ACT inhaler Inhale 2 puffs into the lungs every 4 (four) hours as needed for wheezing or shortness of breath.  Marland Kitchen amiodarone (PACERONE) 200 MG tablet TAKE 1/2 TABLET(100 MG) BY MOUTH DAILY  . atorvastatin (LIPITOR) 40 MG tablet TAKE 1 TABLET BY MOUTH EVERY DAY  . calcium carbonate (TUMS - DOSED IN MG ELEMENTAL CALCIUM) 500 MG chewable tablet Chew 1 tablet by mouth daily as needed for heartburn.   . folic acid (FOLVITE) 1 MG tablet TAKE 1 TABLET BY MOUTH EVERY DAY  . lisinopril (ZESTRIL) 10 MG tablet TAKE 1 TABLET(10 MG) BY MOUTH DAILY  . metoprolol succinate (TOPROL-XL) 50 MG 24 hr tablet TAKE 1 TABLET BY MOUTH DAILY WITH A MEAL OR IMMEDIATELY FOLLOWING  . Multiple Vitamin (MULTIVITAMIN WITH MINERALS) TABS tablet Take 1 tablet by mouth daily.  . Pyridoxine HCl (VITAMIN B6 PO) Take 1 tablet by mouth.  Alveda Reasons 20 MG TABS tablet TAKE 1 TABLET(20 MG) BY MOUTH DAILY WITH SUPPER  . Desmopressin Acetate  (NOCDURNA) 27.7 MCG SUBL Place 27.7 mcg under the tongue daily.  Marland Kitchen doxycycline (VIBRA-TABS) 100 MG tablet Take 1 tablet (100 mg total) by mouth 2 (two) times daily. (Patient not taking: Reported on 07/05/2019)  . furosemide (LASIX) 20 MG tablet Take 1 tablet (20 mg total) by mouth daily as needed for fluid or edema. Use if you become short of breath or experience weight gain. (Patient not taking: Reported on 07/05/2019)  . predniSONE (DELTASONE) 20 MG tablet Take 2 tablets (40 mg total) by mouth daily with breakfast. (Patient not taking: Reported on 07/05/2019)  . umeclidinium-vilanterol (ANORO ELLIPTA) 62.5-25 MCG/INH AEPB Inhale 1 puff into the lungs daily. (Patient not taking: Reported on 07/05/2019)   No facility-administered encounter medications on file as of 07/05/2019.    Current Diagnosis/Assessment:  SDOH Interventions     Most Recent Value  SDOH Interventions  SDOH Interventions for the Following Domains  Financial Strain  Financial Strain Interventions  Other (Comment) [manufacturer assistance for Xarelto and Stiolto in coverage gap]      Goals Addressed            This Visit's Progress   . Afib: Xarelto cost       CARE PLAN ENTRY (see longitudinal plan of care for additional care plan information)  Current Barriers:  . Financial Barriers: patient has [employer] and reports copay for Xarelto is cost prohibitive during the coverage gap  Pharmacist Clinical Goal(s):  Marland Kitchen Over the next 120 days, patient will work with PharmD and providers to relieve medication access  concerns  Interventions: . Comprehensive medication review completed; medication list updated in electronic medical record.  [name] care team collaboration (see longitudinal plan of care) . Xarelto by [name]: Patient informed of medication access program [name], patient will be able to obtain Xarelto for $85 per month during the coverage gap . Patient does not meet minimum OOP spend  to qualify for [facility] patient assistance  Patient Self Care Activities:  . Patient will contact pharmacist when he enters coverage gap  Initial goal documentation     . Cholesterol: goal LDL < 70       CARE PLAN ENTRY (see longitudinal plan of care for additional care plan information)  Current Barriers:  . Controlled hyperlipidemia, complicated by CAD, CHF, HTN . Current antihyperlipidemic regimen: atorvastatin 40 mg daily . Previous antihyperlipidemic medications tried n/a . Most recent lipid panel:     Component Value Date/Time   CHOL 124 08/18/2018 1025   TRIG 81 08/18/2018 1025   HDL 62 08/18/2018 1025   CHOLHDL 2.0 08/18/2018 1025   CHOLHDL 2 07/27/2017 0906   VLDL 12.2 07/27/2017 0906   LDLCALC 46 08/18/2018 1025   . ASCVD risk enhancing conditions: age >31, HTN, CHF, current smoker  Pharmacist Clinical Goal(s):  Marland Kitchen Over the next 120 days, patient will work with PharmD and providers towards optimized antihyperlipidemic therapy  Interventions: . Comprehensive medication review performed; medication list updated in electronic medical record.  [name] care team collaboration (see longitudinal plan of care) . Discussed importance of medication compliance and healthy diet to maintain cholesterol at goal  Patient Self Care Activities:  . Patient will focus on medication adherence by pill box . Patient will focus on maintaining healthy low-cholesterol diet  Initial goal documentation     . COPD: Smoking Cessation and Inhaler accessibility       CARE PLAN ENTRY (see longitudinal plan of care for additional care plan information)  Current Barriers:  . Tobacco abuse of 51 years; currently smoking 1/2 ppd . Previous quit attempts, unsuccessful using nicotine gum - never quit for longer than a few days . Reports motivation to quit smoking includes: lung cancer concerns . Anoro is cost-prohibitive due to non-preferred insurance status  Pharmacist Clinical  Goal(s):  Marland Kitchen Over the next 120 days, patient will work with PharmD and provider towards tobacco cessation  Interventions: . Comprehensive medication review performed, medication list in electronic medical record updated . Inter-disciplinary care team collaboration (see longitudinal plan of care) . Recommend NRT patches + gum/lozenges. Recommend 21 mg patch and 2 mg gum/lozenge based on current tobacco use.  Marland Kitchen Provided contact information for [place] [facility]uit [facility] (1-800-QUIT-NOW). Patient will outreach this group for support. [name] is preferred LAMA/LABA with his insurance plan, coordinating with PCP to send Rx . Discussed proper use of albuterol HFA as rescue inhaler, as needed for shortness of breath/wheezing  Patient Self Care Activities:  . Patient will take Stiolto - 2 puffs once daily as directed . Patient will use albuterol as rescue inhaler . Patient will contact [place] Quitline when he wants help obtaining NRT products . Patient will contact pharmacist with medication concerns or questions  Initial goal documentation     . Hypertension: goal BP < 130/80       CARE PLAN ENTRY (see longitudinal plan of care for additional care plan information)  Current Barriers:  . Uncontrolled hypertension, complicated by CHF, CAD, Afib, HLD . Current antihypertensive regimen: lisinopril 10 mg daily, metoprolol succinate 50 mg  daily, furosemide 20 mg PRN . Previous antihypertensives tried: n/a . Last practice recorded BP readings:  BP Readings from Last 3 Encounters:  04/27/19 140/70  02/24/19 120/60  07/06/18 (!) 117/56   . Current home BP readings: not checking Kidney Function Lab Results  Component Value Date   CREATININE 1.14 08/18/2018   CREATININE 1.15 07/27/2017   CREATININE 1.20 01/09/2017      Component Value Date/Time   GFR 67.62 07/27/2017 0906   GFRNONAA 66 08/18/2018 1025   GFRAA 77 08/18/2018 1025    Pharmacist Clinical Goal(s):  Marland Kitchen Over the next 120 days, patient will work  with PharmD and providers to optimize antihypertensive regimen  Interventions: . Inter-disciplinary care team collaboration (see longitudinal plan of care) . Comprehensive medication review performed; medication list updated in the electronic medical record.  . Discussed BP goal < 130/80 and benefits of medications  Patient Self Care Activities:  . Patient will continue to check BP 1-2 times weekly , document, and provide at future appointments with goal < 130/80 . Patient will focus on medication adherence by pill box  Initial goal documentation        AFIB   Patient is currently rate controlled.  Patient has failed these meds in past: n/a Patient is currently controlled on the following medications: amiodarone 100 mg daily (1/2 of 200 mg), metoprolol succinate 50 mg daily, Xarelto 20 mg daily  We discussed:  Pt denies palpitations, does bruise easily with Xarelto but denies major bleeding. Discussed importance of Xarelto compliance for stroke prevention.   Plan  Continue current medications   Heart Failure/Hypertension   Type: Left Ventricular Failure  Last ejection fraction: 44% (2017) NYHA Class: I (no actitivty limitation) AHA HF Stage: B (Heart disease present - no symptoms present)   Kidney Function Lab Results  Component Value Date   CREATININE 1.14 08/18/2018   CREATININE 1.15 07/27/2017   CREATININE 1.20 01/09/2017      Component Value Date/Time   GFR 67.62 07/27/2017 0906   GFRNONAA 66 08/18/2018 1025   GFRAA 77 08/18/2018 1025    Office blood pressures are  BP Readings from Last 3 Encounters:  04/27/19 140/70  02/24/19 120/60  07/06/18 (!) 117/56   Patient checks BP at home infrequently  Patient home BP readings are ranging: n/a  Patient has failed these meds in past: n/a  Patient is currently controlled on the following medications: furosemide 20 mg daily prn, lisinopril 10 mg daily, metoprolol succinate 50 mg daily  We discussed BP goals,  benefits of monitoring BP at home. Pt has not taken furosemide in many month, denies swelling, SOB or weight gain.  Plan  Continue current medications and control with diet and exercise   Hyperlipidemia/CAD   Lipid Panel     Component Value Date/Time   CHOL 124 08/18/2018 1025   TRIG 81 08/18/2018 1025   HDL 62 08/18/2018 1025   CHOLHDL 2.0 08/18/2018 1025   CHOLHDL 2 07/27/2017 0906   VLDL 12.2 07/27/2017 0906   LDLCALC 46 08/18/2018 1025   LABVLDL 16 08/18/2018 1025     The ASCVD Risk score (Goff DC Jr., et al., 2013) failed to calculate for the following reasons:   The valid total cholesterol range is 130 to 320 mg/dL   Patient has failed these meds in past: n/a Patient is currently controlled on the following medications: atorvastatin 40 mg daily  We discussed:  diet and exercise extensively, benefits of statin and maintaining cholesterol at goal.  Plan  Continue current medications and control with diet and exercise    COPD /  Tobacco   Last spirometry score: 2018 pre-only: FEV1 42% pred, FEV/FVC 0.72  Gold Grade: Gold 3 (FEV1 30-49%) Current COPD Classification:  B (high sx, <2 exacerbations/yr)  Eosinophil count:   Lab Results  Component Value Date/Time   EOSPCT 2 03/04/2016 11:36 PM  %                               Eos (Absolute):  Lab Results  Component Value Date/Time   EOSABS 0.2 03/04/2016 11:36 PM   Tobacco Status:  Social History   Tobacco Use  Smoking Status Current Some Day Smoker  . Packs/day: 1.00  . Years: 51.00  . Pack years: 51.00  . Types: Cigarettes  . Last attempt to quit: 03/05/2016  . Years since quitting: 3.3  Smokeless Tobacco Never Used  Tobacco Comment   .5 pack, thinks about quitting but not ready yet   Patient has failed these meds in past: Symbicort, Xoponex, Anoro Patient is currently uncontrolled on the following medications:albuterol HFA  Using maintenance inhaler regularly? No - Anoro no longer covered by  insurance Frequency of rescue inhaler use:  multiple times per day  We discussed:  Anoro is not on formulary in 2021, pt has been using albuterol 2-3 times daily. Pt is audibly short of breath on phone. He plans to follow up next month for repeat CT scan for lung cancer screening. Per 2021 [facility] is the preferred LAMA/LABA for $45/month.  Discussed smoking cessation, pt has not tried to quit before for longer than a day, he is down to ~1/2 ppd and does want to quit, but not quite ready yet. Discussed NRT and Chantix,  gave patient [place] quitline number to obtain free patches and gum when he is ready.  Plan  Recommended Stiolto - 2 puffs once daily based on insurance Recommended nicotine patch + gum for smoking cessation  Nocturia   Patient has failed these meds in past: desmopressin Patient is currently controlled on the following medications: no meds  We discussed:  Pt is no longer taking desmopressin, he stopped a while ago due to side effects and lack of benefit.  Plan  Continue to monitor for sx  GERD   Patient has failed these meds in past: n/a Patient is currently controlled on the following medications: Tums prn  We discussed:  Pt take Tums rarely  Plan  Continue current medications   Health Maintenance   Patient is currently controlled on the following medications: multivitamin, Vitamin B6, folic acid 1 mg  We discussed:  Patient is satisfied with current OTC regimen and denies issues  Plan  Continue current medications   Medication Management   Pt uses [facility] for all medications Uses pill box Pt endorses 90% compliance - forgets PM doses occasionally  We discussed: Verbal consent obtained for [facility] enhanced pharmacy services (medication synchronization, adherence packaging, delivery coordination). A medication sync plan was created to allow patient to get all medications delivered once every 30 to 90 days per patient  preference. Patient understands they have freedom to choose pharmacy and clinical pharmacist will coordinate care between all prescribers and [facility].   Plan  Utilize [facility] for medication synchronization, packaging and delivery      Follow up: 4 month phone visit  Charlene Brooke, PharmD Clinical Pharmacist [facility]  at [place]

## 2019-07-05 NOTE — Patient Instructions (Addendum)
Visit Information  Thank you for meeting with me to discuss your medications! I look forward to working with you to achieve your health care goals. Below is a summary of what we talked about during the visit:  Goals Addressed            This Visit's Progress   . Afib: Xarelto cost       CARE PLAN ENTRY (see longitudinal plan of care for additional care plan information)  Current Barriers:  . Financial Barriers: patient has [employer] and reports copay for Xarelto is cost prohibitive during the coverage gap  Pharmacist Clinical Goal(s):  Marland Kitchen Over the next 90 days, patient will work with PharmD and providers to relieve medication access concerns  Interventions: . Comprehensive medication review completed; medication list updated in electronic medical record.  [name] care team collaboration (see longitudinal plan of care) . Xarelto by [name]: Patient informed of medication access program [name], patient will be able to obtain Xarelto for $85 per month during the coverage gap . Patient does not meet minimum OOP spend to qualify for [name] patient assistance  Patient Self Care Activities:  . Patient will contact pharmacist when he enters coverage gap  Initial goal documentation     . Cholesterol: goal LDL < 70       CARE PLAN ENTRY (see longitudinal plan of care for additional care plan information)  Current Barriers:  . Controlled hyperlipidemia, complicated by CAD, CHF, HTN . Current antihyperlipidemic regimen: atorvastatin 40 mg daily . Previous antihyperlipidemic medications tried n/a . Most recent lipid panel:     Component Value Date/Time   CHOL 124 08/18/2018 1025   TRIG 81 08/18/2018 1025   HDL 62 08/18/2018 1025   CHOLHDL 2.0 08/18/2018 1025   CHOLHDL 2 07/27/2017 0906   VLDL 12.2 07/27/2017 0906   LDLCALC 46 08/18/2018 1025   . ASCVD risk enhancing conditions: age >67, HTN, CHF, current smoker  Pharmacist Clinical Goal(s):  Marland Kitchen Over  the next 90 days, patient will work with PharmD and providers towards optimized antihyperlipidemic therapy  Interventions: . Comprehensive medication review performed; medication list updated in electronic medical record.  [name] care team collaboration (see longitudinal plan of care) . Discussed importance of medication compliance and healthy diet to maintain cholesterol at goal  Patient Self Care Activities:  . Patient will focus on medication adherence by pill box . Patient will focus on maintaining healthy low-cholesterol diet  Initial goal documentation     . COPD: Smoking Cessation and Inhaler accessibility       CARE PLAN ENTRY (see longitudinal plan of care for additional care plan information)  Current Barriers:  . Tobacco abuse of 51 years; currently smoking 1/2 ppd . Previous quit attempts, unsuccessful using nicotine gum - never quit for longer than a few days . Reports motivation to quit smoking includes: lung cancer concerns . Anoro is cost-prohibitive due to non-preferred insurance status  Pharmacist Clinical Goal(s):  Marland Kitchen Over the next 90 days, patient will work with PharmD and provider towards tobacco cessation  Interventions: . Comprehensive medication review performed, medication list in electronic medical record updated . Inter-disciplinary care team collaboration (see longitudinal plan of care) . Recommend NRT patches + gum/lozenges. Recommend 21 mg patch and 2 mg gum/lozenge based on current tobacco use.  Marland Kitchen Provided contact information for [name] (1-800-QUIT-NOW). Patient will outreach this group for support. [name] is preferred LAMA/LABA with his insurance plan, coordinating with PCP to send  Rx . Discussed proper use of albuterol HFA as rescue inhaler, as needed for shortness of breath/wheezing  Patient Self Care Activities:  . Patient will take Stiolto - 2 puffs once daily as directed . Patient will use albuterol as rescue inhaler .  Patient will contact [name] when he wants help obtaining NRT products . Patient will contact pharmacist with medication concerns or questions  Initial goal documentation     . Hypertension: goal BP < 130/80       CARE PLAN ENTRY (see longitudinal plan of care for additional care plan information)  Current Barriers:  . Uncontrolled hypertension, complicated by CHF, CAD, Afib, HLD . Current antihypertensive regimen: lisinopril 10 mg daily, metoprolol succinate 50 mg daily, furosemide 20 mg PRN . Previous antihypertensives tried: n/a . Last practice recorded BP readings:  BP Readings from Last 3 Encounters:  04/27/19 140/70  02/24/19 120/60  07/06/18 (!) 117/56   . Current home BP readings: not checking Kidney Function Lab Results  Component Value Date   CREATININE 1.14 08/18/2018   CREATININE 1.15 07/27/2017   CREATININE 1.20 01/09/2017      Component Value Date/Time   GFR 67.62 07/27/2017 0906   GFRNONAA 66 08/18/2018 1025   GFRAA 77 08/18/2018 1025    Pharmacist Clinical Goal(s):  Marland Kitchen Over the next 90 days, patient will work with PharmD and providers to optimize antihypertensive regimen  Interventions: . Inter-disciplinary care team collaboration (see longitudinal plan of care) . Comprehensive medication review performed; medication list updated in the electronic medical record.  . Discussed BP goal < 130/80 and benefits of medications  Patient Self Care Activities:  . Patient will continue to check BP 1-2 times weekly , document, and provide at future appointments with goal < 130/80 . Patient will focus on medication adherence by pill box  Initial goal documentation        [name] was given information about Chronic Care Management services today including:  1. CCM service includes personalized support from designated clinical staff supervised by his physician, including individualized plan of care and coordination with other care providers 2. 24/7 contact  phone numbers for assistance for urgent and routine care needs. 3. Standard insurance, coinsurance, copays and deductibles apply for chronic care management only during months in which we provide at least 20 minutes of these services. Most insurances cover these services at 100%, however patients may be responsible for any copay, coinsurance and/or deductible if applicable. This service may help you avoid the need for more expensive face-to-face services. 4. Only one practitioner may furnish and bill the service in a calendar month. 5. The patient may stop CCM services at any time (effective at the end of the month) by phone call to the office staff.  Patient agreed to services and verbal consent obtained.   The patient verbalized understanding of instructions provided today and agreed to receive a mailed copy of patient instruction and/or educational materials. Telephone follow up appointment with pharmacy team member scheduled for: 4 months  Charlene Brooke, PharmD Clinical Pharmacist [facility] [phone]   How to Use a Soft Mist Inhaler  A soft mist inhaler is a handheld device for taking medicine that you breathe (inhale) into your lungs. The device changes a liquid medicine into a mist that can be inhaled. You may need a soft mist inhaler if you have a disease that causes your breathing tubes to narrow (bronchospasm). Using a soft mist inhaler helps prevent bronchospasm and  keeps your airway open. A soft mist inhaler may be part of your long-term treatment for asthma or chronic obstructive pulmonary disease (COPD). The usual dosage is two inhalations every day. What are the risks?  If you do not use your inhaler correctly, medicine might not reach your lungs to help you breathe.  The medicine in the inhaler can cause side effects, such as: ? Chest tightness or difficulty breathing. ? Eye redness, eye pain, or vision changes. ? Difficulty passing urine.  ? Dry mouth. ? Sore throat. ? Cough. ? Headache. ? Sinus congestion (sinusitis). Supplies needed:  Inhaler. The medicine that you will need comes in the inhaler. Each device contains the amount of medicine needed for 60 uses (30 daily doses of 2 inhalations). How to use a soft mist inhaler Using an inhaler for the first time 1. Remove the clear base of the inhaler by pressing the safety catch on the cap with your thumb and pulling off the clear base with your other hand. 2. On the label, write down the date that will be three months from now. This is the date you should throw away the inhaler. 3. Place the medicine cartridge that comes with the inhaler into the base of the inhaler. Press the cartridge on a flat surface to click it into place. Click the clear plastic base back into place over the cartridge. 4. Turn the clear base in the direction of the arrows on the label until you hear a click. 5. Open the cap on top of the inhaler. It should snap open all the way to show you the mouthpiece. 6. Prepare (prime) the inhaler for use. To do this, point the inhaler toward the ground and press on the dose-release button below the mouthpiece. You should see the release of some mist. Be careful not to get any mist into your eyes. If you do not see mist, turn the base, open the cap, and prime the inhaler again until you see the mist. If you still do not see the mist, return the inhaler to your pharmacist for help. Taking an inhaled dose 1. Hold the inhaler upright. 2. Use your thumb and pointer finger to turn the base of the inhaler until you hear a click. This means the dose chamber is ready to deliver the medicine. 3. Open the cap until you hear a click. 4. Hold the inhaler in one hand with your pointer finger over the dose-release button. 5. Turn your head away from the inhaler and breathe out slowly. 6. Close your lips around the mouthpiece. 7. Point the inhaler toward the back of your mouth. 8.  Press the dose-release button while taking a slow, deep breath through your mouth. 9. Hold your breath for 10 seconds, or as long as you can. 10. Turn your head away from the inhaler and breath out slowly through pursed lips. 11. Take a second inhalation, if your health care provider told you to. Do not take extra doses if you do not feel the mist as you inhale. Follow these instructions at home: General instructions  Check the indicator on the inhaler to keep track of your doses. When the indicator is in the red zone, you have 7 days left. Get a refill at this time. The inhaler will lock when it is empty.  Throw away your inhaler if you have not used it in more than 30 days.  Do not use any products that contain nicotine or tobacco, such as cigarettes and e-cigarettes.  If you need help quitting, ask your health care provider.  Tell your provider about: ? All your medical conditions. Use soft mist medicine with caution if you have glaucoma, an enlarged prostate, or kidney disease. ? If you are or may become pregnant. ? All medicines you take. Some medicines can affect (interact with) the medicines in your inhaler.  Keep all follow-up visits as told by your health care provider. This is important. Using your inhaler  Use your soft mist inhaler only as told by your health care provider.  Do inhalations at about the same time each day.  If you have not used your inhaler for more than 3 days, release a mist dose toward the ground before using.  If you have not used your inhaler for more than 21 days, open the cap, turn the base, and prime your inhaler until you see mist. Repeat these steps three more times before using the inhaler. Caring for your inhaler  Store your soft mist inhaler at room temperature and keep it out of reach of children.  Clean the mouthpiece of your inhaler with a damp, clean, cloth once every week. Contact a health care provider if:  You have a very dry mouth or  sore throat.  You have a fever.  You have stuffy nose (nasal congestion) or nasal discharge.  You have a cough that does not go away (is persistent).  You have a headache.  You have trouble passing urine.  You are not sure how to use your inhaler or your inhaler is not working properly. Get help right away if:  You have an allergic reaction. Some symptoms of an allergic reaction are an itchy rash, swelling of your face or tongue, or difficulty breathing.  You have severe and sudden eye pain or changes in your vision. Summary  A soft mist inhaler is a treatment for COPD or asthma.  You may have to take two inhalations each day on a long-term basis to prevent bronchospasm.  Follow instructions carefully in order to use your inhaler properly.  Common side effects include throat or sinus infection, dry mouth, cough, and headache.  Get help right away if you have an allergic reaction, sudden eye pain, or changes in vision. This information is not intended to replace advice given to you by your health care provider. Make sure you discuss any questions you have with your health care provider. Document Revised: 02/13/2017 Document Reviewed: 02/21/2016 Elsevier Patient Education  [place].

## 2019-07-07 ENCOUNTER — Telehealth: Payer: Self-pay

## 2019-07-07 MED ORDER — ALBUTEROL SULFATE HFA 108 (90 BASE) MCG/ACT IN AERS: 2.0000 | INHALATION_SPRAY | RESPIRATORY_TRACT | 2 refills | Status: DC | PRN

## 2019-07-07 MED ORDER — ATORVASTATIN CALCIUM 40 MG PO TABS: 40.0000 mg | ORAL_TABLET | Freq: Every day | ORAL | 1 refills | Status: DC

## 2019-07-07 MED ORDER — LISINOPRIL 10 MG PO TABS: ORAL_TABLET | ORAL | 3 refills | Status: DC

## 2019-07-07 NOTE — Telephone Encounter (Signed)
-----   Message from [place], [facility] sent at 07/05/2019 11:17 AM EDT ----- Regarding: Med refills - [place] Pt is switching to [facility], can you order his refills?  Albuterol HFA Atorvastatin 40 mg Lisinopril 10 mg

## 2019-07-07 NOTE — Telephone Encounter (Signed)
Erx sent as requested.  

## 2019-07-08 NOTE — Addendum Note (Signed)
Addended by: Karle Barr on: 07/08/2019 09:12 AM   Modules accepted: Orders

## 2019-07-11 ENCOUNTER — Telehealth: Payer: Self-pay | Admitting: *Deleted

## 2019-07-11 DIAGNOSIS — N401 Enlarged prostate with lower urinary tract symptoms: Secondary | ICD-10-CM | POA: Diagnosis not present

## 2019-07-11 DIAGNOSIS — R972 Elevated prostate specific antigen [PSA]: Secondary | ICD-10-CM | POA: Diagnosis not present

## 2019-07-11 DIAGNOSIS — R351 Nocturia: Secondary | ICD-10-CM | POA: Diagnosis not present

## 2019-07-11 DIAGNOSIS — R3912 Poor urinary stream: Secondary | ICD-10-CM | POA: Diagnosis not present

## 2019-07-11 NOTE — Telephone Encounter (Signed)
   [facility] Pre-operative Risk Assessment    Request for surgical clearance:  1. What type of surgery is being performed? PROSTATE BIOPSY   2. When is this surgery scheduled? 09/15/19   3. What type of clearance is required (medical clearance vs. Pharmacy clearance to hold med vs. Both)? BOTH  4. Are there any medications that need to be held prior to surgery and how long? [place]   5. Practice name and name of physician performing surgery? [facility]; DR. Jenny Reichmann [place]   6. What is your office phone number [phone]    7.   What is your office fax number [phone]  8.   Anesthesia type (None, local, MAC, general) ? PER [facility] NO ANESTHESIA TO BE USED   Julaine Hua 07/11/2019, 11:02 AM  _________________________________________________________________   (provider comments below)

## 2019-07-12 ENCOUNTER — Other Ambulatory Visit: Payer: Self-pay | Admitting: Cardiology

## 2019-07-12 NOTE — Telephone Encounter (Signed)
Outpatient Medication Detail   Disp Refills Start End   amiodarone (PACERONE) 200 MG tablet 45 tablet 2 07/05/2019    Sig: TAKE 1/2 TABLET(100 MG) BY MOUTH DAILY   Sent to pharmacy as: amiodarone (PACERONE) 200 MG tablet   E-Prescribing Status: Receipt confirmed by pharmacy (07/05/2019 12:41 PM EDT)   [place], [place] [address]    Outpatient Medication Detail   Disp Refills Start End   atorvastatin (LIPITOR) 40 MG tablet 90 tablet 1 07/07/2019    Sig - Route: Take 1 tablet (40 mg total) by mouth daily. - Oral   Sent to pharmacy as: atorvastatin (LIPITOR) 40 MG tablet   Notes to Pharmacy: Pt needs appt for future refills 1st attempt Thank you   E-Prescribing Status: Receipt confirmed by pharmacy (07/07/2019 11:28 AM EDT)   Pharmacy  [place], [place] [address]

## 2019-07-13 NOTE — Telephone Encounter (Signed)
   Primary Cardiologist: Candee Furbish, MD  Chart reviewed as part of pre-operative protocol coverage. Patient was contacted 07/13/2019 in reference to pre-operative risk assessment for pending surgery as outlined below.  [name] was last seen on 02/2019 by Dr. [name]. Since that day, [name] has done well. He reports chronic unchanged dyspnea which is stable. He has not had any angina or changes in his cardiac status. Overall he feels like he is doing very well. RCRI is >11% indicating higher risk of CV complications but he has been clinically stable. Therefore, based on ACC/AHA guidelines, the patient would be at acceptable risk for the planned procedure without further cardiovascular testing.   Per Dr. [name], he states "I would be ok with him holding his Xarelto for 2 days prior to prostate procedure. Resume when felt comfortable from urology standpoint." Since procedure is scheduled for July, please let us know if there are any clinical changes between now and surgery. The patient is also aware to call us if he has any changes in his health status.   I will route this recommendation to the requesting party via Epic fax function and remove from pre-op pool.  Please call with questions.  Charlie Pitter, PA-C 07/13/2019, 1:16 PM

## 2019-07-13 NOTE — Telephone Encounter (Signed)
   Primary Cardiologist: Candee Furbish, MD  Chart reviewed as part of pre-operative protocol coverage. Patient has history of PAF, bradycardia, CAD, dilated cardiomyopathy, hypotension, atrial flutter, hyponatremia, BPH, carotid artery disease, central retinal artery occlusion, HTN, HLD, GERD, NSVT, SVT, Raynaud's, TIA, tobacco abuse. At last in person OV O2 sat documented at 88% but 100% at more recent pulmonology visit. Will route to pharm for input on Xarelto then pt will need call.  Charlie Pitter, PA-C 07/13/2019, 11:14 AM

## 2019-07-13 NOTE — Telephone Encounter (Signed)
Patient with diagnosis of PAF/atrial flutter on Xarelto for anticoagulation.    Procedure: Prostate biospy Date of procedure: 09/15/19  CHADS2-VASc score of  6 (CHF, HTN, AGE, stroke/tia x 2, CAD, AGE,)  CrCl 67 mL/min.  Last SCr 08/2018.  Patient is high risk due to history of TIA.  Will defer to MD

## 2019-07-13 NOTE — Telephone Encounter (Signed)
I would be ok with him holding his Xarelto for 2 days prior to prostate procedure.  Bleeding risks are to high to continue through needed procedure.  Resume when felt comfortable from urology standpoint.  Risk of stroke should be small given short length of stoppage.   Candee Furbish, MD

## 2019-07-26 ENCOUNTER — Ambulatory Visit (INDEPENDENT_AMBULATORY_CARE_PROVIDER_SITE_OTHER)
Admission: RE | Admit: 2019-07-26 | Discharge: 2019-07-26 | Disposition: A | Discharge status: Home / Self Care | Payer: Medicare HMO | Source: Ambulatory Visit | Attending: Acute Care | Admitting: Acute Care

## 2019-07-26 ENCOUNTER — Other Ambulatory Visit: Payer: Self-pay

## 2019-07-26 DIAGNOSIS — J432 Centrilobular emphysema: Secondary | ICD-10-CM | POA: Diagnosis not present

## 2019-07-26 DIAGNOSIS — R918 Other nonspecific abnormal finding of lung field: Secondary | ICD-10-CM

## 2019-07-26 DIAGNOSIS — Z87891 Personal history of nicotine dependence: Secondary | ICD-10-CM

## 2019-07-26 DIAGNOSIS — F17210 Nicotine dependence, cigarettes, uncomplicated: Secondary | ICD-10-CM

## 2019-07-26 NOTE — Progress Notes (Signed)
Please call patient and let them  know their  low dose Ct was read as a Lung  RADS 3, nodules that are probably benign findings, short term follow up suggested: includes nodules with a low likelihood of becoming a clinically active cancer. Radiology recommends a 6 month repeat LDCT follow up. .Please let them  know we will order and schedule their  annual screening scan for 01/2020 Please let them  know there was notation of CAD on their  scan.  Please remind the patient  that this is a non-gated exam therefore degree or severity of disease  cannot be determined. Please have them  follow up with their PCP regarding potential risk factor modification, dietary therapy or pharmacologic therapy if clinically indicated. Pt.  is  currently on statin therapy. Please place order for 6 month follow up  screening scan for  01/2020 and fax results to PCP. Thanks so much.

## 2019-07-26 NOTE — Progress Notes (Signed)
I have called this results to the patient. He verbalized understanding . I explained that the nodules noted in his previous scan were stable, but that there are 2 new small nodules that we will need to check in 6 months. He verbalized understanding and agrees to repeat scan in 6 months to follow these two new nodules.  Langley Gauss, please place order for 6 month follow up and fax results to PCP. Thanks so much

## 2019-07-28 ENCOUNTER — Other Ambulatory Visit: Payer: Self-pay | Admitting: *Deleted

## 2019-07-28 DIAGNOSIS — Z87891 Personal history of nicotine dependence: Secondary | ICD-10-CM

## 2019-07-28 DIAGNOSIS — F17210 Nicotine dependence, cigarettes, uncomplicated: Secondary | ICD-10-CM

## 2019-09-07 ENCOUNTER — Telehealth: Payer: Self-pay | Admitting: Pharmacist

## 2019-09-07 NOTE — Telephone Encounter (Signed)
Received message from [facility] pharmacy that patient is now in the donut hole. Stiolto is $115 and Xarelto is $130 for 30-day supplies. Contacted patient to discuss options. He will pay for this month so he has medication, for future refills he will contact [employer], maintenance for Xarelto refills at $85/month while he is in the donut hole. I will pursue patient assistance via [place] for [place]. Patient agreed to come in to office tomorrow to sign paperwork.  Pt voiced understanding of plan. No further questions or concerns.  Charlton Haws, [facility]

## 2019-09-14 ENCOUNTER — Ambulatory Visit: Payer: Self-pay | Admitting: Pharmacist

## 2019-09-14 DIAGNOSIS — E7800 Pure hypercholesterolemia, unspecified: Secondary | ICD-10-CM

## 2019-09-14 DIAGNOSIS — I10 Essential (primary) hypertension: Secondary | ICD-10-CM

## 2019-09-14 NOTE — Chronic Care Management (AMB) (Signed)
  Chronic Care Management   Outreach Note  09/14/2019 Name: SHELDON SEM MRN: [id number] DOB: 06-Jul-1950  Referred by: Hoyt Koch, MD Reason for referral : Chronic Care Management and Medication Management   Reviewed chart for medication changes ahead of medication coordination call. . No Office visits, Consults, or Hospital visits since last care coordination call/Pharmacist visit.  . No medication changes indicated  BP Readings from Last 3 Encounters:  04/27/19 140/70  02/24/19 120/60  07/06/18 (!) 117/56    No results found for: HGBA1C   Medication management via [facility] pharmacy: Patient obtains medications through V[facility]  90 Days   Patient is due for next adherence delivery on: 09/16/19. Called patient and reviewed medications and coordinated delivery.  This delivery to include:  Atorvastatin 40 mg daily  Lisinopril 10 mg daily   Metoprolol Succinate ER 50 mg daily   Patient declined the following meds: -amiodarone (plenty on hand) -albuterol (PRN)  Confirmed delivery date of 09/16/19, advised patient that pharmacy will contact them the morning of delivery.   Charlene Brooke, PharmD

## 2019-09-15 DIAGNOSIS — N4232 Atypical small acinar proliferation of prostate: Secondary | ICD-10-CM | POA: Diagnosis not present

## 2019-09-15 DIAGNOSIS — R972 Elevated prostate specific antigen [PSA]: Secondary | ICD-10-CM | POA: Diagnosis not present

## 2019-09-15 DIAGNOSIS — C61 Malignant neoplasm of prostate: Secondary | ICD-10-CM | POA: Diagnosis not present

## 2019-09-21 NOTE — Progress Notes (Addendum)
Cardiology Office Note   Date:  09/22/2019   ID:  [name], DOB 05/06/1950, MRN [id number]  PCP:  Hoyt Koch, MD  Cardiologist:  Dr. [name]    Chief Complaint  Patient presents with  . Atrial Fibrillation  . Coronary Artery Disease      History of Present Illness: [name] is a 69 y.o. male who presents for PAF and CAD   He has a hx of paroxysmal atrial fibrillation, coronary artery disease with dilated cardiomyopathy.  Prior TEE cardioversion 03/06/2016 with amiodarone, seems to be maintaining sinus rhythm.  Ejection fraction improved from 25-45. Last echo 2017   Cardiac catheterization 07/06/2010 showed chronic total occlusion of distal RCA with collaterals from right to left.  Medical management.  Has had hypotension in the past.  Also had atrial flutter ablation 10/18/2010.  Hyponatremia, urology  02/24/2019 -Here for atrial fibrillation coronary disease follow-up.  Doing well without any anginal symptoms.  No palpitations.  Amiodarone seems to be holding him steady.  Most recent lab work in June was all normal thyroid liver etc.  Has an upcoming physical with Dr. Sharlet Salina soon.  Had bradycardia with higher dose of amiodarone    Prostate biopsy 06/2019 CT chest with CAD and known occlusion of distal RCA.   Today pt is doing well no awareness of atrial fib. No chest pain and just stained his deck.  He does continue to smoke half a PPD, we discussed cutting to 7 cigarettes per day.  No lightheadedness or dizziness.  Except maybe briefly when standing up.  He has not yet rec'd results of prostate biopsy and he may need more surgery. Will check echo to make sure stable.  He has no SOB.   No edema.  He also is worried about lung cancer, his nodules had increased in size.      Past Medical History:  Diagnosis Date  . Adenomatous colon polyp   . Alcohol use   . Antiphospholipid antibody positive    ???In the past???but not proven according to patient   . Antiphospholipid antibody positive    ??? in the past, but not proven according to the patient.  . Atrial flutter ([place])    atrial flutter ablation,   Dr.Allred October 18, 2010  . BPH (benign prostatic hypertrophy)   . CAD (coronary artery disease)    Catheterization 2005 disease / catheterization July 06, 2010, chronic total occlusion distal RCA with collaterals from right and left side.  Medical therapy recommended, mild decreased LV function       . Carotid artery disease (H[facility])    4-28% R. ICA, 76-81% LICA... Doppler.... January, 2010  /  Doppler... in January, 2011 no change  /  doppler1/27/2012...stable  1-57% RICA 26-20% LICA  . CHF (congestive heart failure) ([place])   . CRAO (central retinal artery occlusion)   . Ejection fraction     EF 50-55%, echo, March, 2012... inferobasal and distal septal hypokinesis /      60%, echo, November, 2008  . GERD (gastroesophageal reflux disease)   . Hypercholesterolemia   . Hypertension   . Myocardial infarction ([place])   . Nonsustained ventricular tachycardia ([place])   . Palpitations    probable SVT, rate 170,, at home,, lasted one hour,, 2012  . Raynaud's syndrome   . TIA (transient ischemic attack)    Possible small vessel tia in the past.  . Tobacco abuse     Past Surgical History:  Procedure Laterality Date  .  CARDIAC CATHETERIZATION  06/27/2003   2005.... mild irregularity of the LAD..( patient had had abnormal Myoview scan)  . CARDIOVERSION N/A 03/06/2016   Procedure: CARDIOVERSION;  Surgeon: Josue Hector, MD;  Location: [facility] ENDOSCOPY;  Service: Cardiovascular;  Laterality: N/A;  . INGUINAL HERNIA REPAIR  2009   right with mesh.  Dr Marlou Starks  . TEE WITHOUT CARDIOVERSION N/A 03/06/2016   Procedure: TRANSESOPHAGEAL ECHOCARDIOGRAM (TEE);  Surgeon: Josue Hector, MD;  Location: [facility] ENDOSCOPY;  Service: Cardiovascular;  Laterality: N/A;  . THYROGLOSSAL DUCT CYST  2006   Dr. Wilburn Cornelia     Current Outpatient Medications  Medication Sig  Dispense Refill  . albuterol (VENTOLIN HFA) 108 (90 Base) MCG/ACT inhaler Inhale 2 puffs into the lungs every 4 (four) hours as needed for wheezing or shortness of breath. 6.7 g 2  . amiodarone (PACERONE) 200 MG tablet TAKE 1/2 TABLET(100 MG) BY MOUTH DAILY 45 tablet 2  . atorvastatin (LIPITOR) 40 MG tablet Take 1 tablet (40 mg total) by mouth daily. 90 tablet 1  . calcium carbonate (TUMS - DOSED IN MG ELEMENTAL CALCIUM) 500 MG chewable tablet Chew 1 tablet by mouth daily as needed for heartburn.     . folic acid (FOLVITE) 1 MG tablet TAKE 1 TABLET BY MOUTH EVERY DAY 90 tablet 1  . lisinopril (ZESTRIL) 10 MG tablet TAKE 1 TABLET(10 MG) BY MOUTH DAILY 90 tablet 3  . metoprolol succinate (TOPROL-XL) 50 MG 24 hr tablet TAKE 1 TABLET BY MOUTH DAILY WITH A MEAL OR IMMEDIATELY FOLLOWING 90 tablet 2  . Multiple Vitamin (MULTIVITAMIN WITH MINERALS) TABS tablet Take 1 tablet by mouth daily. 30 tablet 0  . Pyridoxine HCl (VITAMIN B6 PO) Take 1 tablet by mouth.    . Tiotropium Bromide-Olodaterol (STIOLTO RESPIMAT) 2.5-2.5 MCG/ACT AERS Inhale 2 puffs into the lungs daily. 12 g 3  . XARELTO 20 MG TABS tablet TAKE 1 TABLET(20 MG) BY MOUTH DAILY WITH SUPPER 30 tablet 5   No current facility-administered medications for this visit.    Allergies:   Cialis [tadalafil], Levitra [vardenafil], and Viagra [sildenafil citrate]    Social History:  The patient  reports that he has been smoking cigarettes. He has a 51.00 pack-year smoking history. He has never used smokeless tobacco. He reports current alcohol use of about 21.0 standard drinks of alcohol per week. He reports that he does not use drugs.   Family History:  The patient's family history is not on file. He was adopted.    ROS:  General:no colds or fevers, no weight changes Skin:no rashes or ulcers HEENT:no blurred vision, no congestion CV:see HPI PUL:see HPI GI:no diarrhea constipation or melena, no indigestion GU:no hematuria, no dysuria MS:no  joint pain, no claudication Neuro:no syncope, no lightheadedness Endo:no diabetes, no thyroid disease  Wt Readings from Last 3 Encounters:  09/22/19 161 lb (73 kg)  04/27/19 166 lb 6.4 oz (75.5 kg)  02/24/19 170 lb 12.8 oz (77.5 kg)     PHYSICAL EXAM: VS:  BP 130/68   Pulse 63   Ht 6\' 2"  (1.88 m)   Wt 161 lb (73 kg)   SpO2 100%   BMI 20.67 kg/m  , BMI Body mass index is 20.67 kg/m. General:Pleasant affect, NAD Skin:Warm and dry, brisk capillary refill HEENT:normocephalic, sclera clear, mucus membranes moist Neck:supple, no JVD, no bruits  Heart:S1S2 RRR without murmur, gallup, rub or click Lungs:clear without rales, rhonchi, or wheezes WCB:JSEG, non tender, + BS, do not palpate liver spleen or masses Ext:no  lower ext edema, 2+ pedal pulses, 2+ radial pulses Neuro:alert and oriented, MAE, follows commands, + facial symmetry    EKG:  EKG is ordered today. The ekg ordered today demonstrates SR at 58, large S wave in ant leads old, no changes from 2019 stable EKG   Recent Labs: No results found for requested labs within last 8760 hours.    Lipid Panel    Component Value Date/Time   CHOL 124 08/18/2018 1025   TRIG 81 08/18/2018 1025   HDL 62 08/18/2018 1025   CHOLHDL 2.0 08/18/2018 1025   CHOLHDL 2 07/27/2017 0906   VLDL 12.2 07/27/2017 0906   LDLCALC 46 08/18/2018 1025       Other studies Reviewed: Additional studies/ records that were reviewed today include: . TTE 03/12/16 Study Conclusions   - Left ventricle: The cavity size was normal. Wall thickness was  normal. The estimated ejection fraction was 44%. Diffuse  hypokinesis. The study is not technically sufficient to allow  evaluation of LV diastolic function.  - Aortic valve: Sclerosis without stenosis. There was no  regurgitation.  - Mitral valve: Mildly thickened leaflets . There was moderate  regurgitation.  - Left atrium: The atrium was normal in size.  - Right ventricle: The cavity  size was moderately dilated. Systolic  function is mildly reduced.  - Atrial septum: No defect or patent foramen ovale was identified.  - Systemic veins: The IVC measures <2.1 cm, but does not collapse  >50%, suggesting an elevated RA pressure of 8 mmHg.   Impressions:   - Compared to a prior echo on 03/05/2016, the LVEF has improved  from 20-25% up to 44%.   Cardiac cath 07/08/10 The patient has a chronic total occlusion of his distal right coronary artery with collaterals both from the right side and from the left side.  Plan will be medical treatment.  His EF was mildly decreased. Given his runs of supraventricular tachycardia, I am going to change his beta-blocker to Toprol XL 25 mg twice a day.  ASSESSMENT AND PLAN:  1.  PAF on amiodarone (hx of a flutter ablation) will check lifver function and TSH on amiodarone.  Now awareness of a fib and in SR today. 2. Anticoagulation on xarelto no bleeding 3.  CAD with chronic total occlusion RCA with collaterals and stress test in 11/2013 with no ischemia - no angina and is active with yard work and just stained his deck.  If surgery is needed will check echo and if stable or improved no further testing for ischemia 4.  Dilated CM EF improved from 20 to 45%, recheck echo last one 2017 and possible up coming surgery 5.  HLD on statin last lipids 08/2018 with LDL 46  Will recheck today.  6. Tobacco use smokes half PPD we discussed importance of stopping and at least to cut further back.  Follow up with Dr. [name] in 6 months.       Current medicines are reviewed with the patient today.  The patient Has no concerns regarding medicines.  The following changes have been made:  See above Labs/ tests ordered today include:see above  Disposition:   FU:  see above  Signed, Cecilie Kicks, NP  09/22/2019 9:01 AM    [address], [place], [place],  [place] Phone: [phone]; Fax: [phone]

## 2019-09-22 ENCOUNTER — Other Ambulatory Visit: Payer: Self-pay

## 2019-09-22 ENCOUNTER — Encounter: Payer: Self-pay | Admitting: Cardiology

## 2019-09-22 ENCOUNTER — Ambulatory Visit: Payer: Medicare HMO | Admitting: Cardiology

## 2019-09-22 VITALS — BP 130/68 | HR 63 | Ht 74.0 in | Wt 161.0 lb

## 2019-09-22 DIAGNOSIS — I2510 Atherosclerotic heart disease of native coronary artery without angina pectoris: Secondary | ICD-10-CM | POA: Diagnosis not present

## 2019-09-22 DIAGNOSIS — I480 Paroxysmal atrial fibrillation: Secondary | ICD-10-CM | POA: Diagnosis not present

## 2019-09-22 DIAGNOSIS — E7800 Pure hypercholesterolemia, unspecified: Secondary | ICD-10-CM

## 2019-09-22 DIAGNOSIS — I5022 Chronic systolic (congestive) heart failure: Secondary | ICD-10-CM | POA: Insufficient documentation

## 2019-09-22 DIAGNOSIS — I420 Dilated cardiomyopathy: Secondary | ICD-10-CM | POA: Diagnosis not present

## 2019-09-22 LAB — LIPID PANEL
Chol/HDL Ratio: 2 ratio (ref 0.0–5.0)
Cholesterol, Total: 124 mg/dL (ref 100–199)
HDL: 62 mg/dL (ref >39)
LDL Chol Calc (NIH): 48 mg/dL (ref 0–99)
Triglycerides: 66 mg/dL (ref 0–149)
VLDL Cholesterol Cal: 14 mg/dL (ref 5–40)

## 2019-09-22 LAB — CBC
Hematocrit: 45.3 % (ref 37.5–51.0)
Hemoglobin: 15.3 g/dL (ref 13.0–17.7)
MCH: 32.9 pg (ref 26.6–33.0)
MCHC: 33.8 g/dL (ref 31.5–35.7)
MCV: 97 fL (ref 79–97)
Platelets: 236 10*3/uL (ref 150–450)
RBC: 4.65 x10E6/uL (ref 4.14–5.80)
RDW: 13 % (ref 11.6–15.4)
WBC: 8 10*3/uL (ref 3.4–10.8)

## 2019-09-22 LAB — HEPATIC FUNCTION PANEL
ALT: 15 IU/L (ref 0–44)
AST: 19 IU/L (ref 0–40)
Albumin: 4.5 g/dL (ref 3.8–4.8)
Alkaline Phosphatase: 88 IU/L (ref 48–121)
Bilirubin Total: 0.5 mg/dL (ref 0.0–1.2)
Bilirubin, Direct: 0.19 mg/dL (ref 0.00–0.40)
Total Protein: 6.9 g/dL (ref 6.0–8.5)

## 2019-09-22 LAB — TSH: TSH: 2.99 u[IU]/mL (ref 0.450–4.500)

## 2019-09-22 NOTE — Patient Instructions (Signed)
Medication Instructions:  Your physician recommends that you continue on your current medications as directed. Please refer to the Current Medication list given to you today.  *If you need a refill on your cardiac medications before your next appointment, please call your pharmacy*   Lab Work: Hepatic, Lipid, TSH, CBC Today  If you have labs (blood work) drawn today and your tests are completely normal, you will receive your results only by: Marland Kitchen MyChart Message (if you have MyChart) OR . A paper copy in the mail If you have any lab test that is abnormal or we need to change your treatment, we will call you to review the results.   Testing/Procedures: Your physician has requested that you have an echocardiogram. Echocardiography is a painless test that uses sound waves to create images of your heart. It provides your doctor with information about the size and shape of your heart and how well your heart's chambers and valves are working. This procedure takes approximately one hour. There are no restrictions for this procedure.     Follow-Up: At [facility], you and your health needs are our priority.  As part of our continuing mission to provide you with exceptional heart care, we have created designated Provider Care Teams.  These Care Teams include your primary Cardiologist (physician) and Advanced Practice Providers (APPs -  Physician Assistants and Nurse Practitioners) who all work together to provide you with the care you need, when you need it.  We recommend signing up for the patient portal called "MyChart".  Sign up information is provided on this After Visit Summary.  MyChart is used to connect with patients for Virtual Visits (Telemedicine).  Patients are able to view lab/test results, encounter notes, upcoming appointments, etc.  Non-urgent messages can be sent to your provider as well.   To learn more about what you can do with MyChart, go to [email or web address].    Your  next appointment:   6 month(s)  The format for your next appointment:   In Person  Provider:   You may see Candee Furbish, MD or one of the following Advanced Practice Providers on your designated Care Team:    Truitt Merle, NP  Cecilie Kicks, NP  Kathyrn Drown, NP    Other Instructions None

## 2019-10-05 DIAGNOSIS — N401 Enlarged prostate with lower urinary tract symptoms: Secondary | ICD-10-CM | POA: Diagnosis not present

## 2019-10-05 DIAGNOSIS — R3912 Poor urinary stream: Secondary | ICD-10-CM | POA: Diagnosis not present

## 2019-10-05 DIAGNOSIS — N5201 Erectile dysfunction due to arterial insufficiency: Secondary | ICD-10-CM | POA: Diagnosis not present

## 2019-10-05 DIAGNOSIS — R351 Nocturia: Secondary | ICD-10-CM | POA: Diagnosis not present

## 2019-10-05 DIAGNOSIS — C61 Malignant neoplasm of prostate: Secondary | ICD-10-CM | POA: Diagnosis not present

## 2019-10-07 ENCOUNTER — Telehealth: Payer: Self-pay | Admitting: Cardiology

## 2019-10-07 NOTE — Telephone Encounter (Signed)
Called Stacy back and informed her there was only one order in patient's chart for echocardiogram. Marzetta Board thanked me for the call.

## 2019-10-07 NOTE — Telephone Encounter (Signed)
Received a call from [facility], calling for Dr. Uvaldo Bristle from [facility]. Stated that she needed to speak with Dr. [name] about 2 Echo orders and to see which order he wants. Direct number to call is [phone].

## 2019-10-17 ENCOUNTER — Ambulatory Visit: Payer: Medicare HMO | Admitting: Pharmacist

## 2019-10-17 ENCOUNTER — Other Ambulatory Visit: Payer: Self-pay

## 2019-10-17 DIAGNOSIS — I480 Paroxysmal atrial fibrillation: Secondary | ICD-10-CM

## 2019-10-17 DIAGNOSIS — I5022 Chronic systolic (congestive) heart failure: Secondary | ICD-10-CM

## 2019-10-17 DIAGNOSIS — J432 Centrilobular emphysema: Secondary | ICD-10-CM

## 2019-10-17 DIAGNOSIS — I10 Essential (primary) hypertension: Secondary | ICD-10-CM

## 2019-10-17 DIAGNOSIS — E7800 Pure hypercholesterolemia, unspecified: Secondary | ICD-10-CM

## 2019-10-17 DIAGNOSIS — F17200 Nicotine dependence, unspecified, uncomplicated: Secondary | ICD-10-CM

## 2019-10-17 DIAGNOSIS — I2510 Atherosclerotic heart disease of native coronary artery without angina pectoris: Secondary | ICD-10-CM

## 2019-10-17 NOTE — Chronic Care Management (AMB) (Signed)
Chronic Care Management Pharmacy  Name: [name]  MRN: [id number] DOB: 12-05-1950  Chief Complaint/ HPI  [name],  69 y.o. , male presents for their Follow-Up CCM visit with the clinical pharmacist via telephone due to COVID-19 Pandemic.  PCP : Hoyt Koch, MD  Their chronic conditions include: HTN, CAD (no stents), Afib, CHF, HLD, GERD, BPH, tobacco use  Patient is taking care of granddaughter during the week. His wife is a retired Marine scientist.  Office Visits: 04/07/19 Dr Sharlet Salina VV: cough/SOB, rx'd prednisone and doxycyline for COPD exacerbation. Referred for lung cancer screening  Consult Visit: 09/22/19 NP Cecilie Kicks (cardiology): checked LFT and TSH on amiodarone - both WNL. Discussed smoking cessation. No med changes.  04/27/19 NP Groce (pulmonary): lung cancer screening, smoking cessation counseling, referred to pulmonary for PFTs  02/24/19 Dr [name] (cardiology): bradycardia - improved after amiodarone decreased to 100 mg. CAD - stress test 2015 no evidence of ischemia, medical mgmt only.   Medications: Outpatient Encounter Medications as of 10/17/2019  Medication Sig  . albuterol (VENTOLIN HFA) 108 (90 Base) MCG/ACT inhaler Inhale 2 puffs into the lungs every 4 (four) hours as needed for wheezing or shortness of breath.  Marland Kitchen amiodarone (PACERONE) 200 MG tablet TAKE 1/2 TABLET(100 MG) BY MOUTH DAILY  . atorvastatin (LIPITOR) 40 MG tablet Take 1 tablet (40 mg total) by mouth daily.  . calcium carbonate (TUMS - DOSED IN MG ELEMENTAL CALCIUM) 500 MG chewable tablet Chew 1 tablet by mouth daily as needed for heartburn.   . folic acid (FOLVITE) 1 MG tablet TAKE 1 TABLET BY MOUTH EVERY DAY  . lisinopril (ZESTRIL) 10 MG tablet TAKE 1 TABLET(10 MG) BY MOUTH DAILY  . metoprolol succinate (TOPROL-XL) 50 MG 24 hr tablet TAKE 1 TABLET BY MOUTH DAILY WITH A MEAL OR IMMEDIATELY FOLLOWING  . Multiple Vitamin (MULTIVITAMIN WITH MINERALS) TABS tablet Take 1 tablet by mouth  daily.  . Pyridoxine HCl (VITAMIN B6 PO) Take 1 tablet by mouth.  . Tiotropium Bromide-Olodaterol (STIOLTO RESPIMAT) 2.5-2.5 MCG/ACT AERS Inhale 2 puffs into the lungs daily.  Alveda Reasons 20 MG TABS tablet TAKE 1 TABLET(20 MG) BY MOUTH DAILY WITH SUPPER   No facility-administered encounter medications on file as of 10/17/2019.    Current Diagnosis/Assessment:    SDOH Interventions     Most Recent Value  SDOH Interventions  Financial Strain Interventions --  [Stiolto PAP,  Xarelto Janssen select]      Goals Addressed            This Visit's Progress   . Pharmacy Care Plan       CARE PLAN ENTRY (see longitudinal plan of care for additional care plan information)  Current Barriers:  . Chronic Disease Management support, education, and care coordination needs related to Hypertension, Hyperlipidemia, Atrial Fibrillation, Heart Failure, Coronary Artery Disease, COPD, and Tobacco use   Hypertension / Heart Failure BP Readings from Last 3 Encounters:  09/22/19 130/68  04/27/19 140/70  02/24/19 120/60 .  Pharmacist Clinical Goal(s): o Over the next 180 days, patient will work with PharmD and providers to maintain BP goal <130/80 . Current regimen:  o furosemide 20 mg daily prn, o lisinopril 10 mg daily,  o metoprolol succinate 50 mg daily . Interventions: o Discussed BP goals and benefits of medication for prevention of heart attack / stroke . Patient self care activities - Over the next 180 days, patient will: o Check BP as needed, document, and provide at future appointments  o Ensure daily salt intake < 2300 mg/day  Hyperlipidemia / CAD Lab Results  Component Value Date/Time   LDLCALC 48 09/22/2019 09:03 AM .  Pharmacist Clinical Goal(s): o Over the next 180 days, patient will work with PharmD and providers to maintain LDL goal < 70 . Current regimen:  o Atorvastatin 40 mg daily . Interventions: o Discussed cholesterol goals and benefits of medication for prevention of  heart attack / stroke . Patient self care activities - Over the next 180 days, patient will: o Continue medication as prescribed  Atrial Fibrillation . Pharmacist Clinical Goal(s) o Over the next 180 days, patient will work with PharmD and providers to optimize therapy . Current regimen:   amiodarone 100 mg daily (1/2 of 200 mg)  metoprolol succinate 50 mg daily  Xarelto 20 mg daily . Interventions: o Xarelto through [name] during donut hole - for $85/month - will not qualify for Xarelto patient assistance until 2021 prescription out-of-pocket costs exceed $2280 . Patient self care activities - Over the next 180 days, patient will: o Continue medication as prescribed o [employer] for Xarelto refills during donut hole coverage gap  COPD / Tobacco use . Pharmacist Clinical Goal(s) o Over the next 180 days, patient will work with PharmD and providers to optimize therapy . Current regimen:  o Stiolto Respimat - 2 puffs once daily o Albuterol HFA as needed . Interventions: o Pursue patient assistance through [employer] for [employer] due to high cost in coverage gap . Patient self care activities - Over the next 180 days, patient will: o Take medications as prescribed   Medication management . Pharmacist Clinical Goal(s): o Over the next 180 days, patient will work with PharmD and providers to achieve optimal medication adherence . Current pharmacy: [facility] . Interventions o Comprehensive medication review performed. o Utilize [facility] pharmacy for medication synchronization, packaging and delivery . Patient self care activities - Over the next 180 days, patient will: o Focus on medication adherence by pill pack o Take medications as prescribed o Report any questions or concerns to PharmD and/or provider(s)  Please see past updates related to this goal by clicking on the "Past Updates" button in the selected goal        AFIB   Patient is currently rate  controlled. Pulse Readings from Last 3 Encounters:  09/22/19 63  04/27/19 60  02/24/19 65   Patient has failed these meds in past: n/a Patient is currently controlled on the following medications:   amiodarone 100 mg daily (1/2 of 200 mg),   metoprolol succinate 50 mg daily,   Xarelto 20 mg daily  We discussed:  Pt is getting Xarelto through [name] for $85/month during donut hole. He does not qualify for Xarelto patient assistance ([employer] requires 4% out-of-pocket spend on rx's).    Plan  Continue current medications  Heart Failure/Hypertension   Type: Left Ventricular Failure  Last ejection fraction: 44% (2017) NYHA Class: I (no actitivty limitation) AHA HF Stage: B (Heart disease present - no symptoms present)   BP goal < 130/80  Office blood pressures are  BP Readings from Last 3 Encounters:  09/22/19 130/68  04/27/19 140/70  02/24/19 120/60   Kidney Function Lab Results  Component Value Date/Time   CREATININE 1.14 08/18/2018 10:25 AM   CREATININE 1.15 07/27/2017 09:06 AM   CREATININE 1.11 02/22/2016 10:08 AM   CREATININE 0.98 08/20/2013 09:03 AM   GFR 67.62 07/27/2017 09:06 AM   GFRNONAA 66 08/18/2018  10:25 AM   GFRAA 77 08/18/2018 10:25 AM   K 4.7 08/18/2018 10:25 AM   K 4.5 07/27/2017 09:06 AM   Patient checks BP at home infrequently  Patient home BP readings are ranging: n/a  Patient has failed these meds in past: n/a  Patient is currently controlled on the following medications:   furosemide 20 mg daily prn,  lisinopril 10 mg daily,   metoprolol succinate 50 mg daily  We discussed: pt denies swelling or SOB, endorses compliance with medications, has not needed furosemide regularly.  Plan  Continue current medications and control with diet and exercise  Hyperlipidemia / CAD   LDL goal < 70 Hx CAD  Lipid Panel     Component Value Date/Time   CHOL 124 09/22/2019 0903   TRIG 66 09/22/2019 0903   HDL 62 09/22/2019 0903   CHOLHDL  2.0 09/22/2019 0903   CHOLHDL 2 07/27/2017 0906   VLDL 12.2 07/27/2017 0906   LDLCALC 48 09/22/2019 0903   LABVLDL 14 09/22/2019 0903    The ASCVD Risk score (Goff DC Jr., et al., 2013) failed to calculate for the following reasons:   The valid total cholesterol range is 130 to 320 mg/dL   Patient has failed these meds in past: n/a Patient is currently controlled on the following medications:   atorvastatin 40 mg daily  We discussed:  diet and exercise extensively, cholesterol goals; benefit of statin for ASCVD prevention  Plan  Continue current medications and control with diet and exercise   COPD    Last spirometry score: 2018 pre-only: FEV1 42% pred, FEV/FVC 0.72  Gold Grade: Gold 3 (FEV1 30-49%) Current COPD Classification:  B (high sx, <2 exacerbations/yr)  Patient has failed these meds in past: Symbicort, Xoponex, Anoro Patient is currently uncontrolled on the following medications:  albuterol HFA  Stiolto Respimat 2 puffs daily  Using maintenance inhaler regularly? Yes  Frequency of rescue inhaler use:  daily  We discussed:  Pt is doing well on Stiolto; denies SOB; pt was initially denies patient assistance via [employer], we will appeal using OOP costs for Stiolto which exceed $300 for the year.   Plan  Continue current medications Pursue Stiolto PAP appeal  Tobacco Abuse   Tobacco Status:  Social History   Tobacco Use  Smoking Status Current Every Day Smoker  . Packs/day: 1.00  . Years: 51.00  . Pack years: 51.00  . Types: Cigarettes  Smokeless Tobacco Never Used  Tobacco Comment   .5 pack, thinks about quitting but not ready yet    reports MOTIVATION to quit is high  reports CONFIDENCE in quitting is low  Previous quit attempts included: n/a Patient is currently uncontrolled on the following medications:  . No medications  We discussed:  Provided contact information for  Quit Line (1-800-QUIT-NOW) and encouraged patient to reach out to this  group for support. Pt wants to quit but wants to do it with his wife; again discussed free nicotine patches/gum available through [facility] Quitline. We have discussed Chantix in the past, pt not willing to try right now.  Plan  Continue to monitor and encourage cessation  Medication Management   Pt uses [facility] for all medications Uses pill box Pt endorses 90% compliance - forgets PM doses occasionally  We discussed:Reviewed patient's [facility] medication and Epic medication profile assuring there are no discrepancies or gaps in therapy. Confirmed all fill dates appropriate and verified with patient that there is a sufficient quantity of all prescribed  medications at home. Informed patient to call me any time if needing medications before scheduled deliveries.     Plan  Utilize [facility] pharmacy for medication synchronization, packaging and delivery      Follow up: 6 month phone visit  Charlene Brooke, PharmD Clinical Pharmacist [facility] [phone]

## 2019-10-17 NOTE — Patient Instructions (Addendum)
Visit Information  Phone number for Pharmacist: [phone]  Goals Addressed            This Visit's Progress   . Pharmacy Care Plan       CARE PLAN ENTRY (see longitudinal plan of care for additional care plan information)  Current Barriers:  . Chronic Disease Management support, education, and care coordination needs related to Hypertension, Hyperlipidemia, Atrial Fibrillation, Heart Failure, Coronary Artery Disease, COPD, and Tobacco use   Hypertension / Heart Failure BP Readings from Last 3 Encounters:  09/22/19 130/68  04/27/19 140/70  02/24/19 120/60 .  Pharmacist Clinical Goal(s): o Over the next 180 days, patient will work with PharmD and providers to maintain BP goal <130/80 . Current regimen:  o furosemide 20 mg daily prn, o lisinopril 10 mg daily,  o metoprolol succinate 50 mg daily . Interventions: o Discussed BP goals and benefits of medication for prevention of heart attack / stroke . Patient self care activities - Over the next 180 days, patient will: o Check BP as needed, document, and provide at future appointments o Ensure daily salt intake < 2300 mg/day  Hyperlipidemia / CAD Lab Results  Component Value Date/Time   LDLCALC 48 09/22/2019 09:03 AM .  Pharmacist Clinical Goal(s): o Over the next 180 days, patient will work with PharmD and providers to maintain LDL goal < 70 . Current regimen:  o Atorvastatin 40 mg daily . Interventions: o Discussed cholesterol goals and benefits of medication for prevention of heart attack / stroke . Patient self care activities - Over the next 180 days, patient will: o Continue medication as prescribed  Atrial Fibrillation . Pharmacist Clinical Goal(s) o Over the next 180 days, patient will work with PharmD and providers to optimize therapy . Current regimen:   amiodarone 100 mg daily (1/2 of 200 mg)  metoprolol succinate 50 mg daily  Xarelto 20 mg daily . Interventions: o Xarelto through [name]  during donut hole - for $85/month - will not qualify for Xarelto patient assistance until 2021 prescription out-of-pocket costs exceed $2280 . Patient self care activities - Over the next 180 days, patient will: o Continue medication as prescribed o Designer, jewellery for Xarelto refills during donut hole coverage gap  COPD / Tobacco use . Pharmacist Clinical Goal(s) o Over the next 180 days, patient will work with PharmD and providers to optimize therapy . Current regimen:  o Stiolto Respimat - 2 puffs once daily o Albuterol HFA as needed . Interventions: o Pursue patient assistance through [employer] for [employer] due to high cost in coverage gap . Patient self care activities - Over the next 180 days, patient will: o Take medications as prescribed   Medication management . Pharmacist Clinical Goal(s): o Over the next 180 days, patient will work with PharmD and providers to achieve optimal medication adherence . Current pharmacy: [facility] . Interventions o Comprehensive medication review performed. o Utilize [facility] pharmacy for medication synchronization, packaging and delivery . Patient self care activities - Over the next 180 days, patient will: o Focus on medication adherence by pill pack o Take medications as prescribed o Report any questions or concerns to PharmD and/or provider(s)  Please see past updates related to this goal by clicking on the "Past Updates" button in the selected goal       Patient verbalizes understanding of instructions provided today.   Telephone follow up appointment with pharmacy team member scheduled for: 6 months  Charlene Brooke, PharmD Clinical Pharmacist [facility]  Steps to Quit Smoking Smoking tobacco is the leading cause of preventable death. It can affect almost every organ in the body. Smoking puts you and those around you at risk for developing many serious chronic diseases. Quitting  smoking can be difficult, but it is one of the best things that you can do for your health. It is never too late to quit. How do I get ready to quit? When you decide to quit smoking, create a plan to help you succeed. Before you quit:  Pick a date to quit. Set a date within the next 2 weeks to give you time to prepare.  Write down the reasons why you are quitting. Keep this list in places where you will see it often.  Tell your family, friends, and co-workers that you are quitting. Support from your loved ones can make quitting easier.  Talk with your health care provider about your options for quitting smoking.  Find out what treatment options are covered by your health insurance.  Identify people, places, things, and activities that make you want to smoke (triggers). Avoid them. What first steps can I take to quit smoking?  Throw away all cigarettes at home, at work, and in your car.  Throw away smoking accessories, such as Scientist, research (medical).  Clean your car. Make sure to empty the ashtray.  Clean your home, including curtains and carpets. What strategies can I use to quit smoking? Talk with your health care provider about combining strategies, such as taking medicines while you are also receiving in-person counseling. Using these two strategies together makes you more likely to succeed in quitting than if you used either strategy on its own.  If you are pregnant or breastfeeding, talk with your health care provider about finding counseling or other support strategies to quit smoking. Do not take medicine to help you quit smoking unless your health care provider tells you to do so. To quit smoking: Quit right away  Quit smoking completely, instead of gradually reducing how much you smoke over a period of time. Research shows that stopping smoking right away is more successful than gradually quitting.  Attend in-person counseling to help you build problem-solving skills. You are  more likely to succeed in quitting if you attend counseling sessions regularly. Even short sessions of 10 minutes can be effective. Take medicine You may take medicines to help you quit smoking. Some medicines require a prescription and some you can purchase over-the-counter. Medicines may have nicotine in them to replace the nicotine in cigarettes. Medicines may:  Help to stop cravings.  Help to relieve withdrawal symptoms. Your health care provider may recommend:  Nicotine patches, gum, or lozenges.  Nicotine inhalers or sprays.  Non-nicotine medicine that is taken by mouth. Find resources Find resources and support systems that can help you to quit smoking and remain smoke-free after you quit. These resources are most helpful when you use them often. They include:  Online chats with a Social worker.  Telephone quitlines.  Printed Furniture conservator/restorer.  Support groups or group counseling.  Text messaging programs.  Mobile phone apps or applications. Use apps that can help you stick to your quit plan by providing reminders, tips, and encouragement. There are many free apps for mobile devices as well as websites. Examples include Quit Guide from the [employer] and smokefree.gov What things can I do to make it easier to quit?   Reach out to your family and friends for  support and encouragement. Call telephone quitlines (1-800-QUIT-NOW), reach out to support groups, or work with a counselor for support.  Ask people who smoke to avoid smoking around you.  Avoid places that trigger you to smoke, such as bars, parties, or smoke-break areas at work.  Spend time with people who do not smoke.  Lessen the stress in your life. Stress can be a smoking trigger for some people. To lessen stress, try: ? Exercising regularly. ? Doing deep-breathing exercises. ? Doing yoga. ? Meditating. ? Performing a body scan. This involves closing your eyes, scanning your body from head to toe, and noticing which  parts of your body are particularly tense. Try to relax the muscles in those areas. How will I feel when I quit smoking? Day 1 to 3 weeks Within the first 24 hours of quitting smoking, you may start to feel withdrawal symptoms. These symptoms are usually most noticeable 2-3 days after quitting, but they usually do not last for more than 2-3 weeks. You may experience these symptoms:  Mood swings.  Restlessness, anxiety, or irritability.  Trouble concentrating.  Dizziness.  Strong cravings for sugary foods and nicotine.  Mild weight gain.  Constipation.  Nausea.  Coughing or a sore throat.  Changes in how the medicines that you take for unrelated issues work in your body.  Depression.  Trouble sleeping (insomnia). Week 3 and afterward After the first 2-3 weeks of quitting, you may start to notice more positive results, such as:  Improved sense of smell and taste.  Decreased coughing and sore throat.  Slower heart rate.  Lower blood pressure.  Clearer skin.  The ability to breathe more easily.  Fewer sick days. Quitting smoking can be very challenging. Do not get discouraged if you are not successful the first time. Some people need to make many attempts to quit before they achieve long-term success. Do your best to stick to your quit plan, and talk with your health care provider if you have any questions or concerns. Summary  Smoking tobacco is the leading cause of preventable death. Quitting smoking is one of the best things that you can do for your health.  When you decide to quit smoking, create a plan to help you succeed.  Quit smoking right away, not slowly over a period of time.  When you start quitting, seek help from your health care provider, family, or friends. This information is not intended to replace advice given to you by your health care provider. Make sure you discuss any questions you have with your health care provider. Document Revised:  11/26/2018 Document Reviewed: 05/22/2018 Elsevier Patient Education  [place].

## 2019-10-19 ENCOUNTER — Ambulatory Visit (HOSPITAL_COMMUNITY): Payer: Medicare HMO | Attending: Cardiovascular Disease

## 2019-10-19 ENCOUNTER — Other Ambulatory Visit: Payer: Self-pay

## 2019-10-19 DIAGNOSIS — E7800 Pure hypercholesterolemia, unspecified: Secondary | ICD-10-CM | POA: Insufficient documentation

## 2019-10-19 DIAGNOSIS — I420 Dilated cardiomyopathy: Secondary | ICD-10-CM | POA: Diagnosis not present

## 2019-10-19 DIAGNOSIS — I480 Paroxysmal atrial fibrillation: Secondary | ICD-10-CM | POA: Insufficient documentation

## 2019-10-19 LAB — ECHOCARDIOGRAM COMPLETE
Area-P 1/2: 4.31 cm2
S' Lateral: 2.9 cm

## 2019-10-31 ENCOUNTER — Telehealth: Payer: Self-pay | Admitting: Cardiology

## 2019-10-31 MED ORDER — AMIODARONE HCL 200 MG PO TABS: ORAL_TABLET | ORAL | 3 refills | Status: DC

## 2019-10-31 NOTE — Telephone Encounter (Signed)
*  STAT* If patient is at the pharmacy, call can be transferred to refill team.   1. Which medications need to be refilled? (please list name of each medication and dose if known) Amiodarone 100 mg  2. Which pharmacy/location (including street and city if local pharmacy) is medication to be sent to?* [facility]   3. Do they need a 30 day or 90 day supply? [place]

## 2019-10-31 NOTE — Telephone Encounter (Signed)
Pt's medication was sent to pt's pharmacy as requested. Confirmation received.  °

## 2019-11-02 ENCOUNTER — Telehealth: Payer: Self-pay | Admitting: Pharmacist

## 2019-11-02 NOTE — Progress Notes (Signed)
Contacted [employer] [facility] to determine status of Stiolto PAP appeal. Per [employer] representative, pt still does not qualify after OOP prescription costs were provided.

## 2019-11-14 ENCOUNTER — Telehealth: Payer: Self-pay | Admitting: Pharmacist

## 2019-11-14 NOTE — Progress Notes (Signed)
Chronic Care Management Pharmacy Assistant   Name: ENDRE COUTTS  MRN: [id number] DOB: 1950/10/13  Reason for Encounter: Medication Review  Patient Questions:  1.  Have you seen any other providers since your last visit? No  2.  Any changes in your medicines or health? No   PCP : Hoyt Koch, MD  Allergies:   Allergies  Allergen Reactions  . Cialis [Tadalafil] Other (See Comments)    UNKNOWN  . Levitra [Vardenafil] Other (See Comments)    UNKNOWN  . Viagra [Sildenafil Citrate] Other (See Comments)    UNKNOWN    Medications: Outpatient Encounter Medications as of 11/14/2019  Medication Sig  . albuterol (VENTOLIN HFA) 108 (90 Base) MCG/ACT inhaler Inhale 2 puffs into the lungs every 4 (four) hours as needed for wheezing or shortness of breath.  Marland Kitchen amiodarone (PACERONE) 200 MG tablet TAKE 1/2 TABLET(100 MG) BY MOUTH DAILY  . atorvastatin (LIPITOR) 40 MG tablet Take 1 tablet (40 mg total) by mouth daily.  . calcium carbonate (TUMS - DOSED IN MG ELEMENTAL CALCIUM) 500 MG chewable tablet Chew 1 tablet by mouth daily as needed for heartburn.   . folic acid (FOLVITE) 1 MG tablet TAKE 1 TABLET BY MOUTH EVERY DAY  . lisinopril (ZESTRIL) 10 MG tablet TAKE 1 TABLET(10 MG) BY MOUTH DAILY  . metoprolol succinate (TOPROL-XL) 50 MG 24 hr tablet TAKE 1 TABLET BY MOUTH DAILY WITH A MEAL OR IMMEDIATELY FOLLOWING  . Multiple Vitamin (MULTIVITAMIN WITH MINERALS) TABS tablet Take 1 tablet by mouth daily.  . Pyridoxine HCl (VITAMIN B6 PO) Take 1 tablet by mouth.  . Tiotropium Bromide-Olodaterol (STIOLTO RESPIMAT) 2.5-2.5 MCG/ACT AERS Inhale 2 puffs into the lungs daily.  Alveda Reasons 20 MG TABS tablet TAKE 1 TABLET(20 MG) BY MOUTH DAILY WITH SUPPER   No facility-administered encounter medications on file as of 11/14/2019.    Current Diagnosis: Patient Active Problem List   Diagnosis Date Noted  . Chronic systolic heart failure ([place]) 09/22/2019  . Protein in urine 02/03/2018  .  Centrilobular emphysema ([place]) 05/06/2016  . CHF (congestive heart failure) ([place]) 03/05/2016  . Routine general medical examination at a health care facility 02/28/2014  . Paroxysmal atrial fibrillation ([place]) 12/05/2013  . Nonsustained ventricular tachycardia ([place])   . 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[place]) 04/22/2013  . Atrial fibrillation with RVR ([place])   . CAD (coronary artery disease)   . Raynaud's syndrome   . CRAO (central retinal artery occlusion)   . Carotid artery disease ([place])   . TOBACCO ABUSE 04/12/2009  . BPH (benign prostatic hyperplasia) 12/19/2008  . THYROID CYST 07/05/2007  . Hyperlipidemia 07/05/2007  . Alcohol abuse 07/05/2007  . Essential hypertension 07/05/2007  . GERD 07/05/2007    Goals Addressed   None     Follow-Up:  Coordination of Enhanced Pharmacy Services   Reviewed chart for medication changes ahead of medication coordination call.  No OVs, Consults, or hospital visits since last care coordination call/Pharmacist visit.   No medication changes indicated.  BP Readings from Last 3 Encounters:  09/22/19 130/68  04/27/19 140/70  02/24/19 120/60    No results found for: HGBA1C   Patient obtains medications through [facility]  30 Days   Last adherence delivery included:  Stiolto Aer 2.5-2.5 Albuterol Aer Hfa  Lisinopril 10mg   Metoprolol Succinate ER   Patient is due for next adherence delivery on: 11/17/2019. Called patient and reviewed medications and coordinated delivery.  This delivery to include: Amiodarone 200mg   Atrovastatin 40mg   Xarelto 20mg    Confirmed delivery date of 11/17/2019, advised patient that pharmacy will contact them the morning of delivery.   Rosendo Gros, [place] Pharmacist Assistant  [phone]

## 2019-11-15 ENCOUNTER — Telehealth: Payer: Self-pay | Admitting: Pharmacist

## 2019-11-15 NOTE — Progress Notes (Signed)
A user error has taken place: encounter opened in error, closed for administrative reasons.

## 2019-11-15 NOTE — Progress Notes (Signed)
Patient called back and he would like to get Xarelto through [facility], he is aware of cost in donut hole. Updated pharmacy of patient's request.

## 2019-11-15 NOTE — Progress Notes (Addendum)
Patient is getting Xarelto refills through the [facility] while he is in the donut hole ($85/month or $240/3 months filled via [facility]). Patient informed to call [phone] for refill. I have removed Xarelto from this [facility] delivery.

## 2019-11-30 ENCOUNTER — Other Ambulatory Visit: Payer: Self-pay

## 2019-11-30 ENCOUNTER — Encounter: Payer: Self-pay | Admitting: Pulmonary Disease

## 2019-11-30 ENCOUNTER — Ambulatory Visit: Payer: Medicare HMO | Admitting: Pulmonary Disease

## 2019-11-30 VITALS — BP 130/64 | HR 60 | Temp 97.3°F | Ht 74.0 in | Wt 162.8 lb

## 2019-11-30 DIAGNOSIS — J432 Centrilobular emphysema: Secondary | ICD-10-CM | POA: Diagnosis not present

## 2019-11-30 DIAGNOSIS — R911 Solitary pulmonary nodule: Secondary | ICD-10-CM | POA: Diagnosis not present

## 2019-11-30 DIAGNOSIS — F17200 Nicotine dependence, unspecified, uncomplicated: Secondary | ICD-10-CM

## 2019-11-30 NOTE — Progress Notes (Signed)
Patient ID: [name], male    DOB: [birth year], 69 y.o.   MRN: [id number]  Chief Complaint  Patient presents with  . Consult    lung scans, out of breath when going up an incline, smoker    Referring provider: Magdalen Spatz, NP  HPI:   [name] is a 69 year old man with past medical history of ongoing tobacco abuse and severe COPD based on PFTs in 2018 number seen in consultation for evaluation of COPD.  Cardiology notes and notes from referring provider reviewed.  Today, he says he feels well.  He denies any significant respiratory issues.  No significant or limiting exertional dyspnea.  Has been taking dual bronchodilators for some time.  Was on Anoro but insurance would no longer pay so has been on Stiolto since the spring.  Feels like he gets more breath when he uses the bronchodilators.  Does not use albuterol.  No cough.  He has been undergoing surveillance with lung cancer screening of 6 mm nodules.  These are stable in 3 months scan 07/2019 with 2 smaller lower lobe nodules noted compared to 04/2019.  He is due for repeat scan 01/2020.  Discussed at length the nature of pulmonary nodules and the rationale for ongoing surveillance given his seeming smoking history, and concurrent COPD which puts him at high risk for lung cancer.  He continues to smoke.  Discussed rationale and benefits of smoking station including halting progression of COPD and decreasing risk of lung cancer in addition to cardiovascular benefits.  He knows he should quit but is precontemplative, not ready.  Recent echocardiogram reviewed with normal EF and reported normal diastolic function, normal RV function.  Reviewed the CT scans with my interpretation as follows: Small nodules bilaterally, severe diffuse centrilobular emphysema throughout.  PMH: Tobacco abuse, congestive heart failure Surgical history: Hernia repair, Family history: Adopted, unsure Social history: Current smoker, lives in [place],  retired  Licensed conveyancer / Pulmonary Flowsheets:   ACT:  No flowsheet data found.  MMRC: No flowsheet data found.  Epworth:  No flowsheet data found.  Tests:   FENO:  No results found for: NITRICOXIDE  PFT: No flowsheet data found.  WALK:  SIX MIN WALK 05/06/2016  Supplimental Oxygen during Test? (L/min) No  Tech Comments: pt walked a moderate pace, tolerated walk well.     Imaging: Reviewed as per EMR and discussion in this note  Lab Results: Reviewed and as per EMR CBC    Component Value Date/Time   WBC 8.0 09/22/2019 0903   WBC 8.7 07/27/2017 0906   RBC 4.65 09/22/2019 0903   RBC 4.75 07/27/2017 0906   HGB 15.3 09/22/2019 0903   HCT 45.3 09/22/2019 0903   PLT 236 09/22/2019 0903   MCV 97 09/22/2019 0903   MCH 32.9 09/22/2019 0903   MCH 34.0 03/07/2016 0532   MCHC 33.8 09/22/2019 0903   MCHC 34.6 07/27/2017 0906   RDW 13.0 09/22/2019 0903   LYMPHSABS 2.6 03/04/2016 2336   MONOABS 0.6 03/04/2016 2336   EOSABS 0.2 03/04/2016 2336   BASOSABS 0.1 03/04/2016 2336    BMET    Component Value Date/Time   NA 138 08/18/2018 1025   K 4.7 08/18/2018 1025   CL 99 08/18/2018 1025   CO2 22 08/18/2018 1025   GLUCOSE 78 08/18/2018 1025   GLUCOSE 105 (H) 07/27/2017 0906   BUN 17 08/18/2018 1025   CREATININE 1.14 08/18/2018 1025   CREATININE 1.11 02/22/2016 1008   CALCIUM  9.5 08/18/2018 1025   GFRNONAA 66 08/18/2018 1025   GFRAA 77 08/18/2018 1025    BNP    Component Value Date/Time   BNP 322.6 (H) 03/04/2016 2336    ProBNP No results found for: PROBNP  Specialty Problems      Pulmonary Problems   Centrilobular emphysema (HCC)      Allergies  Allergen Reactions  . Cialis [Tadalafil] Other (See Comments)    UNKNOWN  . Levitra [Vardenafil] Other (See Comments)    UNKNOWN  . Viagra [Sildenafil Citrate] Other (See Comments)    UNKNOWN    Immunization History  Administered Date(s) Administered  . Fluad Quad(high Dose 68+) 01/27/2019  .  Influenza, High Dose Seasonal PF 01/13/2018  . PFIZER SARS-COV-2 Vaccination 04/23/2019, 05/18/2019  . Pneumococcal Conjugate-13 05/06/2016  . Pneumococcal Polysaccharide-23 07/27/2017  . Tdap 02/27/2014    Past Medical History:  Diagnosis Date  . Adenomatous colon polyp   . Alcohol use   . Antiphospholipid antibody positive    ???In the past???but not proven according to patient  . Antiphospholipid antibody positive    ??? in the past, but not proven according to the patient.  . Atrial flutter ([place])    atrial flutter ablation,   Dr.Allred October 18, 2010  . BPH (benign prostatic hypertrophy)   . CAD (coronary artery disease)    Catheterization 2005 disease / catheterization July 06, 2010, chronic total occlusion distal RCA with collaterals from right and left side.  Medical therapy recommended, mild decreased LV function       . Carotid artery disease (HCC)    2-70% R. ICA, 62-37% LICA... Doppler.... January, 2010  /  Doppler... in January, 2011 no change  /  doppler1/27/2012...stable  6-28% RICA 31-51% LICA  . CHF (congestive heart failure) ([place])   . CRAO (central retinal artery occlusion)   . Ejection fraction     EF 50-55%, echo, March, 2012... inferobasal and distal septal hypokinesis /      60%, echo, November, 2008  . GERD (gastroesophageal reflux disease)   . Hypercholesterolemia   . Hypertension   . Myocardial infarction ([place])   . Nonsustained ventricular tachycardia ([place])   . Palpitations    probable SVT, rate 170,, at home,, lasted one hour,, 2012  . Prostate cancer ([place])   . Raynaud's syndrome   . TIA (transient ischemic attack)    Possible small vessel tia in the past.  . Tobacco abuse     Tobacco History: Social History   Tobacco Use  Smoking Status Current Every Day Smoker  . Packs/day: 2.00  . Years: 51.00  . Pack years: 102.00  . Types: Cigarettes  . Start date: 1970  Smokeless Tobacco Never Used  Tobacco Comment   11/30/19 .5-1pack per day    Ready to quit: Not Answered Counseling given: Not Answered Comment: 11/30/19 .5-1pack per day   Continue to not smoke  Outpatient Encounter Medications as of 11/30/2019  Medication Sig  . albuterol (VENTOLIN HFA) 108 (90 Base) MCG/ACT inhaler Inhale 2 puffs into the lungs every 4 (four) hours as needed for wheezing or shortness of breath.  Marland Kitchen amiodarone (PACERONE) 200 MG tablet TAKE 1/2 TABLET(100 MG) BY MOUTH DAILY  . atorvastatin (LIPITOR) 40 MG tablet Take 1 tablet (40 mg total) by mouth daily.  . folic acid (FOLVITE) 1 MG tablet TAKE 1 TABLET BY MOUTH EVERY DAY  . lisinopril (ZESTRIL) 10 MG tablet TAKE 1 TABLET(10 MG) BY MOUTH DAILY  . metoprolol succinate (TOPROL-XL)  50 MG 24 hr tablet TAKE 1 TABLET BY MOUTH DAILY WITH A MEAL OR IMMEDIATELY FOLLOWING  . Multiple Vitamin (MULTIVITAMIN WITH MINERALS) TABS tablet Take 1 tablet by mouth daily.  . Tiotropium Bromide-Olodaterol (STIOLTO RESPIMAT) 2.5-2.5 MCG/ACT AERS Inhale 2 puffs into the lungs daily.  Alveda Reasons 20 MG TABS tablet TAKE 1 TABLET(20 MG) BY MOUTH DAILY WITH SUPPER  . Pyridoxine HCl (VITAMIN B6 PO) Take 1 tablet by mouth. (Patient not taking: Reported on 11/30/2019)  . [DISCONTINUED] calcium carbonate (TUMS - DOSED IN MG ELEMENTAL CALCIUM) 500 MG chewable tablet Chew 1 tablet by mouth daily as needed for heartburn.    No facility-administered encounter medications on file as of 11/30/2019.     Review of Systems  Review of Systems  No chest pain with exertion.  No fever chills.  No cough.  No orthopnea or PND.  Comprehensive review of systems otherwise negative. Physical Exam  BP 130/64 (BP Location: Left Arm, Cuff Size: Normal)   Pulse 60   Temp (!) 97.3 F (36.3 C) (Temporal)   Ht 6\' 2"  (1.88 m)   Wt 162 lb 12.8 oz (73.8 kg)   SpO2 100%   BMI 20.90 kg/m   Wt Readings from Last 5 Encounters:  11/30/19 162 lb 12.8 oz (73.8 kg)  09/22/19 161 lb (73 kg)  04/27/19 166 lb 6.4 oz (75.5 kg)  02/24/19 170 lb 12.8 oz  (77.5 kg)  07/06/18 154 lb 12.8 oz (70.2 kg)    BMI Readings from Last 5 Encounters:  11/30/19 20.90 kg/m  09/22/19 20.67 kg/m  04/27/19 21.36 kg/m  02/24/19 21.93 kg/m  07/06/18 19.88 kg/m     Physical Exam General: Well-appearing, sitting in exam chair Eyes: EOMI, no icterus Neck: Supple, no JVD appreciated Respiratory: Clear aspiration bilaterally, no wheeze or crackle Cardiovascular: Regular rhythm, no murmurs Abdomen: Nondistended, bowel sounds present MSK: Ready red to purple discoloration of bilateral hands and feet, mild swelling of fingers, no frank joint effusions Neuro: Normal gait, no weakness Psych: Normal mood, full affect   Assessment & Plan:   Severe COPD: Gold A.  There was improved symptoms on dual bronchodilators.  To continue Stiolto.  Pulmonary nodules: Bilateral 6 mm nodules.  Ongoing surveillance, next scan due 01/2020.  Tobacco abuse: Ongoing.  Provided smoking cessation counseling.  Patient is precontemplative.   Return in about 6 months (around 05/29/2020).   Lanier Clam, MD 11/30/2019

## 2019-11-30 NOTE — Patient Instructions (Signed)
Nice to meet you!  We will get you scheduled for that repeat CT scan in mid November. The longer the spots stay stable the better and more re-assuring it is!  Continue to use Stiolto.  Come back in 6 months with Dr. Silas Flood or sooner as needed

## 2019-12-16 ENCOUNTER — Telehealth: Payer: Self-pay | Admitting: Pharmacist

## 2019-12-16 NOTE — Progress Notes (Signed)
Chronic Care Management Pharmacy Assistant   Name: [name]  MRN: [id number] DOB: 1950/07/20  Reason for Encounter: Medication Review  Patient Questions:  1.  Have you seen any other providers since your last visit? No  2.  Any changes in your medicines or health? No     PCP : Hoyt Koch, MD  Allergies:   Allergies  Allergen Reactions  . Cialis [Tadalafil] Other (See Comments)    UNKNOWN  . Levitra [Vardenafil] Other (See Comments)    UNKNOWN  . Viagra [Sildenafil Citrate] Other (See Comments)    UNKNOWN    Medications: Outpatient Encounter Medications as of 12/16/2019  Medication Sig  . albuterol (VENTOLIN HFA) 108 (90 Base) MCG/ACT inhaler Inhale 2 puffs into the lungs every 4 (four) hours as needed for wheezing or shortness of breath.  Marland Kitchen amiodarone (PACERONE) 200 MG tablet TAKE 1/2 TABLET(100 MG) BY MOUTH DAILY  . atorvastatin (LIPITOR) 40 MG tablet Take 1 tablet (40 mg total) by mouth daily.  . folic acid (FOLVITE) 1 MG tablet TAKE 1 TABLET BY MOUTH EVERY DAY  . lisinopril (ZESTRIL) 10 MG tablet TAKE 1 TABLET(10 MG) BY MOUTH DAILY  . metoprolol succinate (TOPROL-XL) 50 MG 24 hr tablet TAKE 1 TABLET BY MOUTH DAILY WITH A MEAL OR IMMEDIATELY FOLLOWING  . Multiple Vitamin (MULTIVITAMIN WITH MINERALS) TABS tablet Take 1 tablet by mouth daily.  . Pyridoxine HCl (VITAMIN B6 PO) Take 1 tablet by mouth. (Patient not taking: Reported on 11/30/2019)  . Tiotropium Bromide-Olodaterol (STIOLTO RESPIMAT) 2.5-2.5 MCG/ACT AERS Inhale 2 puffs into the lungs daily.  Alveda Reasons 20 MG TABS tablet TAKE 1 TABLET(20 MG) BY MOUTH DAILY WITH SUPPER   No facility-administered encounter medications on file as of 12/16/2019.    Current Diagnosis: Patient Active Problem List   Diagnosis Date Noted  . Chronic systolic heart failure ([place]) 09/22/2019  . Protein in urine 02/03/2018  . Centrilobular emphysema ([place]) 05/06/2016  . CHF (congestive heart failure) ([place]) 03/05/2016  .  Routine general medical examination at a health care facility 02/28/2014  . Paroxysmal atrial fibrillation ([place]) 12/05/2013  . Nonsustained ventricular tachycardia ([place])   . 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[place]) 04/22/2013  . Atrial fibrillation with RVR ([place])   . CAD (coronary artery disease)   . Raynaud's syndrome   . CRAO (central retinal artery occlusion)   . Carotid artery disease ([place])   . TOBACCO ABUSE 04/12/2009  . BPH (benign prostatic hyperplasia) 12/19/2008  . THYROID CYST 07/05/2007  . Hyperlipidemia 07/05/2007  . Alcohol abuse 07/05/2007  . Essential hypertension 07/05/2007  . GERD 07/05/2007    Goals Addressed   None     Follow-Up:  Coordination of Enhanced Pharmacy Services    Reviewed chart for medication changes ahead of medication coordination call.  No OVs, Consults, or hospital visits since last care coordination call/Pharmacist visit. No medication changes indicated. BP Readings from Last 3 Encounters:  11/30/19 130/64  09/22/19 130/68  04/27/19 140/70    No results found for: HGBA1C   Patient obtains medications through Vials  90 Days   Last adherence delivery included:   Lisinopril 10mg  daily Metoprolol Succinate Er 50mg  daily Amiodarone 200mg   tablet daily  Xarelto 20mg  daily  Atorvastatin 40mg  daily    Patient is due for next adherence delivery on: 12/20/2019. Called patient and reviewed medications and coordinated delivery.  This delivery to include:  Lisinopril 10mg  daily Metoprolol Succinate Er 50mg  daily Amiodarone 200mg   tablet daily  Xarelto  20mg  daily  Atorvastatin 40mg  daily   Confirmed delivery date of 10/052021, advised patient that pharmacy will contact them the morning of delivery.  Rosendo Gros, [facility]  Practice Team Manager/ CPA (Clinical Pharmacist Assistant) [phone]

## 2019-12-19 ENCOUNTER — Other Ambulatory Visit: Payer: Self-pay | Admitting: Internal Medicine

## 2019-12-26 ENCOUNTER — Other Ambulatory Visit: Payer: Self-pay | Admitting: Internal Medicine

## 2020-01-27 ENCOUNTER — Other Ambulatory Visit: Payer: Self-pay

## 2020-01-27 ENCOUNTER — Ambulatory Visit (INDEPENDENT_AMBULATORY_CARE_PROVIDER_SITE_OTHER)
Admission: RE | Admit: 2020-01-27 | Discharge: 2020-01-27 | Disposition: A | Discharge status: Home / Self Care | Payer: Medicare HMO | Source: Ambulatory Visit | Attending: Acute Care | Admitting: Acute Care

## 2020-01-27 DIAGNOSIS — F17210 Nicotine dependence, cigarettes, uncomplicated: Secondary | ICD-10-CM

## 2020-01-27 DIAGNOSIS — Z87891 Personal history of nicotine dependence: Secondary | ICD-10-CM | POA: Diagnosis not present

## 2020-01-27 DIAGNOSIS — J432 Centrilobular emphysema: Secondary | ICD-10-CM | POA: Diagnosis not present

## 2020-01-27 DIAGNOSIS — R918 Other nonspecific abnormal finding of lung field: Secondary | ICD-10-CM | POA: Diagnosis not present

## 2020-01-27 DIAGNOSIS — I700 Atherosclerosis of aorta: Secondary | ICD-10-CM | POA: Diagnosis not present

## 2020-01-27 DIAGNOSIS — I2510 Atherosclerotic heart disease of native coronary artery without angina pectoris: Secondary | ICD-10-CM | POA: Diagnosis not present

## 2020-01-30 DIAGNOSIS — C61 Malignant neoplasm of prostate: Secondary | ICD-10-CM | POA: Diagnosis not present

## 2020-01-30 NOTE — Progress Notes (Signed)
Please call patient and let them  know their  low dose Ct was read as a Lung RADS 2: nodules that are benign in appearance and behavior with a very low likelihood of becoming a clinically active cancer due to size or lack of growth. Recommendation per radiology is for a repeat LDCT in 12 months. .Please let them  know we will order and schedule their  annual screening scan for 01/2021. Please let them  know there was notation of CAD on their  scan.  Please remind the patient  that this is a non-gated exam therefore degree or severity of disease  cannot be determined. Please have them  follow up with their PCP regarding potential risk factor modification, dietary therapy or pharmacologic therapy if clinically indicated. Pt.  is  currently on statin therapy. Please place order for annual  screening scan for  01/2021 and fax results to PCP. Thanks so much.  Langley Gauss, this patient is followed by cardiology and has had a recent 2 D echo.

## 2020-02-06 DIAGNOSIS — R351 Nocturia: Secondary | ICD-10-CM | POA: Diagnosis not present

## 2020-02-06 DIAGNOSIS — C61 Malignant neoplasm of prostate: Secondary | ICD-10-CM | POA: Diagnosis not present

## 2020-02-06 DIAGNOSIS — R3912 Poor urinary stream: Secondary | ICD-10-CM | POA: Diagnosis not present

## 2020-02-06 DIAGNOSIS — N401 Enlarged prostate with lower urinary tract symptoms: Secondary | ICD-10-CM | POA: Diagnosis not present

## 2020-02-13 ENCOUNTER — Encounter: Payer: Self-pay | Admitting: *Deleted

## 2020-02-13 ENCOUNTER — Telehealth: Payer: Self-pay | Admitting: Pharmacist

## 2020-02-13 ENCOUNTER — Other Ambulatory Visit: Payer: Self-pay | Admitting: *Deleted

## 2020-02-13 DIAGNOSIS — F17210 Nicotine dependence, cigarettes, uncomplicated: Secondary | ICD-10-CM

## 2020-02-13 DIAGNOSIS — Z87891 Personal history of nicotine dependence: Secondary | ICD-10-CM

## 2020-02-13 NOTE — Progress Notes (Signed)
    Chronic Care Management Pharmacy Assistant   Name: [name]  MRN: [id number] DOB: [birth year]  Reason for Encounter: Medication Review   PCP : Hoyt Koch, MD  Allergies:   Allergies  Allergen Reactions  . Cialis [Tadalafil] Other (See Comments)    UNKNOWN  . Levitra [Vardenafil] Other (See Comments)    UNKNOWN  . Viagra [Sildenafil Citrate] Other (See Comments)    UNKNOWN    Medications: Outpatient Encounter Medications as of 02/13/2020  Medication Sig  . albuterol (VENTOLIN HFA) 108 (90 Base) MCG/ACT inhaler INHALE TWO PUFFS into THE lungs EVERY 4 HOURS AS NEEDED FOR SHORTNESS OF BREATH OR wheezing  . amiodarone (PACERONE) 200 MG tablet TAKE 1/2 TABLET(100 MG) BY MOUTH DAILY  . atorvastatin (LIPITOR) 40 MG tablet TAKE ONE TABLET BY MOUTH ONCE DAILY  . folic acid (FOLVITE) 1 MG tablet TAKE 1 TABLET BY MOUTH EVERY DAY  . lisinopril (ZESTRIL) 10 MG tablet TAKE 1 TABLET(10 MG) BY MOUTH DAILY  . metoprolol succinate (TOPROL-XL) 50 MG 24 hr tablet TAKE 1 TABLET BY MOUTH DAILY WITH A MEAL OR IMMEDIATELY FOLLOWING  . Multiple Vitamin (MULTIVITAMIN WITH MINERALS) TABS tablet Take 1 tablet by mouth daily.  . Pyridoxine HCl (VITAMIN B6 PO) Take 1 tablet by mouth. (Patient not taking: Reported on 11/30/2019)  . Tiotropium Bromide-Olodaterol (STIOLTO RESPIMAT) 2.5-2.5 MCG/ACT AERS Inhale 2 puffs into the lungs daily.  Alveda Reasons 20 MG TABS tablet TAKE 1 TABLET(20 MG) BY MOUTH DAILY WITH SUPPER   No facility-administered encounter medications on file as of 02/13/2020.    Current Diagnosis: Patient Active Problem List   Diagnosis Date Noted  . Chronic systolic heart failure ([place]) 09/22/2019  . Protein in urine 02/03/2018  . Centrilobular emphysema ([place]) 05/06/2016  . CHF (congestive heart failure) ([place]) 03/05/2016  . Routine general medical examination at a health care facility 02/28/2014  . Paroxysmal atrial fibrillation ([place]) 12/05/2013  . Nonsustained ventricular  tachycardia ([place])   . 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[place]) 04/22/2013  . Atrial fibrillation with RVR ([place])   . CAD (coronary artery disease)   . Raynaud's syndrome   . CRAO (central retinal artery occlusion)   . Carotid artery disease ([place])   . TOBACCO ABUSE 04/12/2009  . BPH (benign prostatic hyperplasia) 12/19/2008  . THYROID CYST 07/05/2007  . Hyperlipidemia 07/05/2007  . Alcohol abuse 07/05/2007  . Essential hypertension 07/05/2007  . GERD 07/05/2007    Goals Addressed   None     Follow-Up:  Pharmacist Review    Received call from patient regarding medication management via [facility].  Patient requested an acute fill for Xarelto 20mg  to be delivered: 02/24/2020 Pharmacy needs refills? Yes  Confirmed delivery date of 02/24/2020, advised patient that pharmacy will contact them the morning of delivery.  Rosendo Gros, [facility]  Practice Team Manager/ CPA (Clinical Pharmacist Assistant) [phone]

## 2020-02-14 ENCOUNTER — Other Ambulatory Visit: Payer: Self-pay | Admitting: Cardiology

## 2020-02-14 DIAGNOSIS — I4891 Unspecified atrial fibrillation: Secondary | ICD-10-CM

## 2020-02-14 NOTE — Telephone Encounter (Signed)
Pt last saw Cecilie Kicks, NP on 09/22/19, last labs 08/18/18 Creat 1.14.  Pt is overdue for labwork.  Pt has lab orders in for 01/21 by PCP, but pt has not had labwork drawn. Age 69, weight 73.8kg,need recent labs to calculate CrCl to confirm pt on appropriate dosage of Xarelto 20mg  QD.  Will await call back from pt.

## 2020-02-17 NOTE — Telephone Encounter (Signed)
Pt called and scheduled an appt for Dr. [name] on 03/26/2020. Added labs for the pt to have done on the same day and made an appt as well and sent in a 30 day supply of Xarelto.

## 2020-03-05 ENCOUNTER — Telehealth: Payer: Self-pay | Admitting: Pharmacist

## 2020-03-08 ENCOUNTER — Other Ambulatory Visit: Payer: Self-pay | Admitting: Cardiology

## 2020-03-12 ENCOUNTER — Other Ambulatory Visit: Payer: Self-pay

## 2020-03-12 DIAGNOSIS — I4891 Unspecified atrial fibrillation: Secondary | ICD-10-CM

## 2020-03-12 MED ORDER — RIVAROXABAN 20 MG PO TABS: 20.0000 mg | ORAL_TABLET | Freq: Every day | ORAL | 0 refills | Status: DC

## 2020-03-12 NOTE — Progress Notes (Addendum)
Chronic Care Management Pharmacy Assistant   Name: [name]  MRN: [id number] DOB: October 26, 1950  Reason for Encounter: Medication Review  Patient Questions:  1.  Have you seen any other providers since your last visit? No  2.  Any changes in your medicines or health? No   PCP : Myrlene Broker, MD  Allergies:   Allergies  Allergen Reactions   Cialis [Tadalafil] Other (See Comments)    UNKNOWN   Levitra [Vardenafil] Other (See Comments)    UNKNOWN   Viagra [Sildenafil Citrate] Other (See Comments)    UNKNOWN    Medications: Outpatient Encounter Medications as of 03/05/2020  Medication Sig   albuterol (VENTOLIN HFA) 108 (90 Base) MCG/ACT inhaler INHALE TWO PUFFS into THE lungs EVERY 4 HOURS AS NEEDED FOR SHORTNESS OF BREATH OR wheezing   amiodarone (PACERONE) 200 MG tablet TAKE 1/2 TABLET(100 MG) BY MOUTH DAILY   atorvastatin (LIPITOR) 40 MG tablet TAKE ONE TABLET BY MOUTH ONCE DAILY   folic acid (FOLVITE) 1 MG tablet TAKE 1 TABLET BY MOUTH EVERY DAY   lisinopril (ZESTRIL) 10 MG tablet TAKE 1 TABLET(10 MG) BY MOUTH DAILY   Multiple Vitamin (MULTIVITAMIN WITH MINERALS) TABS tablet Take 1 tablet by mouth daily.   Pyridoxine HCl (VITAMIN B6 PO) Take 1 tablet by mouth. (Patient not taking: Reported on 11/30/2019)   Tiotropium Bromide-Olodaterol (STIOLTO RESPIMAT) 2.5-2.5 MCG/ACT AERS Inhale 2 puffs into the lungs daily.   [DISCONTINUED] metoprolol succinate (TOPROL-XL) 50 MG 24 hr tablet TAKE 1 TABLET BY MOUTH DAILY WITH A MEAL OR IMMEDIATELY FOLLOWING   [DISCONTINUED] XARELTO 20 MG TABS tablet TAKE ONE TABLET BY MOUTH ONCE DAILY   No facility-administered encounter medications on file as of 03/05/2020.    Current Diagnosis: Patient Active Problem List   Diagnosis Date Noted   Chronic systolic heart failure (HCC) 09/22/2019   Protein in urine 02/03/2018   Centrilobular emphysema ([facility]) 05/06/2016   CHF (congestive heart failure) ([facility]) 03/05/2016   Routine general  medical examination at a health care facility 02/28/2014   Paroxysmal atrial fibrillation ([facility]) 12/05/2013   Nonsustained ventricular tachycardia ([facility])    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[facility]) 04/22/2013   Atrial fibrillation with RVR ([facility])    CAD (coronary artery disease)    Raynaud's syndrome    CRAO (central retinal artery occlusion)    Carotid artery disease (H[facility])    TOBACCO ABUSE 04/12/2009   BPH (benign prostatic hyperplasia) 12/19/2008   THYROID CYST 07/05/2007   Hyperlipidemia 07/05/2007   Alcohol abuse 07/05/2007   Essential hypertension 07/05/2007   GERD 07/05/2007    Goals Addressed   None     Follow-Up:  Coordination of Enhanced Pharmacy Services    Reviewed chart for medication changes ahead of medication coordination call.  No OVs, Consults, or hospital visits since last care coordination call/Pharmacist visit.  No medication changes indicated. BP Readings from Last 3 Encounters:  11/30/19 130/64  09/22/19 130/68  04/27/19 140/70    No results found for: HGBA1C   Patient obtains medications through Vials  90 Days   Last adherence delivery included:  Amiodarone 200mg   table daily  Lisinopril 10mg  daily Metoprolol 50mg  Er daily w/ food  Stiolto Aerosol 2.5-2.5 two puffs daily  Xarelto 20mg  daily -30-day supply    Patient is due for next adherence delivery on: 03/20/2020. Called patient and reviewed medications and coordinated delivery.  This delivery to include:  Amiodarone 200mg   table daily  Lisinopril 10mg  daily Metoprolol 50mg  Er daily  w/ food  Stiolto Aerosol 2.5-2.5 two puffs daily  Xarelto 20mg  daily -30-day supply   Patient declined the following medications Atorvastatin 40mg  the patient has a 30-day supply bottle left from [employer].  Finasteride 5mg  a 90-day supply was delivered to the patient on 02/07/2020. Albuterol Aer Hfa the patient stated that he has enough to last for a while.   Patient needs refills for : Xarelto 20mg   prescribed by Dr. [name]. Called [phone] spoke with April she sent the request to the coumadin clinic they will send refills to [facility].   Confirmed delivery date of 03/20/2020, advised patient that pharmacy will contact them the morning of delivery.  Rosendo Gros, [facility]  Practice Team Manager/ CPA (Clinical Pharmacist Assistant) [phone]    10 minutes spent in review, coordination, and documentation.  Doristine Section Clinical Pharmacist [facility]  [phone]

## 2020-03-12 NOTE — Telephone Encounter (Signed)
Xarelto 20mg  refill request received. Pt is 69 years old, weight-73.8kg, Crea-1.14 on 08/18/2018-NEEDS LABS and has an appt for labs on 03/26/20, last seen by 05/24/20 on 09/22/2019 and pending an appt on 03/26/20 with Dr. 05/24/20, Diagnosis-Afib, CrCl-73.52ml/min; Dose is appropriate based on dosing criteria. Will send in refill to requested pharmacy for 30 day supply only and noted on refill.

## 2020-03-15 ENCOUNTER — Telehealth: Payer: Self-pay | Admitting: Pharmacist

## 2020-03-15 NOTE — Progress Notes (Signed)
    Chronic Care Management Pharmacy Assistant   Name: AUDIE STAYER  MRN: [id number] DOB: 12-18-1950  Reason for Encounter: Chart Review   PCP : Myrlene Broker, MD  Allergies:   Allergies  Allergen Reactions  . Cialis [Tadalafil] Other (See Comments)    UNKNOWN  . Levitra [Vardenafil] Other (See Comments)    UNKNOWN  . Viagra [Sildenafil Citrate] Other (See Comments)    UNKNOWN    Medications: Outpatient Encounter Medications as of 03/15/2020  Medication Sig  . albuterol (VENTOLIN HFA) 108 (90 Base) MCG/ACT inhaler INHALE TWO PUFFS into THE lungs EVERY 4 HOURS AS NEEDED FOR SHORTNESS OF BREATH OR wheezing  . amiodarone (PACERONE) 200 MG tablet TAKE 1/2 TABLET(100 MG) BY MOUTH DAILY  . atorvastatin (LIPITOR) 40 MG tablet TAKE ONE TABLET BY MOUTH ONCE DAILY  . folic acid (FOLVITE) 1 MG tablet TAKE 1 TABLET BY MOUTH EVERY DAY  . lisinopril (ZESTRIL) 10 MG tablet TAKE 1 TABLET(10 MG) BY MOUTH DAILY  . metoprolol succinate (TOPROL-XL) 50 MG 24 hr tablet TAKE ONE TABLET BY MOUTH ONCE DAILY WITH A MEAL OR immediately following  . Multiple Vitamin (MULTIVITAMIN WITH MINERALS) TABS tablet Take 1 tablet by mouth daily.  . Pyridoxine HCl (VITAMIN B6 PO) Take 1 tablet by mouth. (Patient not taking: Reported on 11/30/2019)  . rivaroxaban (XARELTO) 20 MG TABS tablet Take 1 tablet (20 mg total) by mouth daily.  . Tiotropium Bromide-Olodaterol (STIOLTO RESPIMAT) 2.5-2.5 MCG/ACT AERS Inhale 2 puffs into the lungs daily.   No facility-administered encounter medications on file as of 03/15/2020.    Current Diagnosis: Patient Active Problem List   Diagnosis Date Noted  . Chronic systolic heart failure (HCC) 09/22/2019  . Protein in urine 02/03/2018  . Centrilobular emphysema (HCC) 05/06/2016  . CHF (congestive heart failure) (HCC) 03/05/2016  . Routine general medical examination at a health care facility 02/28/2014  . Paroxysmal atrial fibrillation (HCC) 12/05/2013  . Nonsustained  ventricular tachycardia (HCC)   . 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HCC) 04/22/2013  . Atrial fibrillation with RVR (HCC)   . CAD (coronary artery disease)   . Raynaud's syndrome   . CRAO (central retinal artery occlusion)   . Carotid artery disease (HCC)   . TOBACCO ABUSE 04/12/2009  . BPH (benign prostatic hyperplasia) 12/19/2008  . THYROID CYST 07/05/2007  . Hyperlipidemia 07/05/2007  . Alcohol abuse 07/05/2007  . Essential hypertension 07/05/2007  . GERD 07/05/2007    Goals Addressed   None     Follow-Up:  Pharmacist Review   Reviewed chart for medication changes and adherence.   No gaps in adherence identified. Patient has follow up scheduled with pharmacy team. No further action required.   Horald Pollen, Clinical Pharmacist Assistant [facility]

## 2020-03-26 ENCOUNTER — Ambulatory Visit: Payer: Medicare HMO | Admitting: Cardiology

## 2020-03-26 ENCOUNTER — Other Ambulatory Visit: Payer: Self-pay

## 2020-03-26 ENCOUNTER — Encounter: Payer: Self-pay | Admitting: Cardiology

## 2020-03-26 ENCOUNTER — Other Ambulatory Visit: Payer: Medicare HMO | Admitting: *Deleted

## 2020-03-26 VITALS — BP 128/68 | HR 68 | Ht 74.0 in | Wt 168.0 lb

## 2020-03-26 DIAGNOSIS — I420 Dilated cardiomyopathy: Secondary | ICD-10-CM

## 2020-03-26 DIAGNOSIS — I480 Paroxysmal atrial fibrillation: Secondary | ICD-10-CM | POA: Diagnosis not present

## 2020-03-26 DIAGNOSIS — E7800 Pure hypercholesterolemia, unspecified: Secondary | ICD-10-CM | POA: Diagnosis not present

## 2020-03-26 DIAGNOSIS — I4891 Unspecified atrial fibrillation: Secondary | ICD-10-CM

## 2020-03-26 DIAGNOSIS — I6523 Occlusion and stenosis of bilateral carotid arteries: Secondary | ICD-10-CM

## 2020-03-26 DIAGNOSIS — I5022 Chronic systolic (congestive) heart failure: Secondary | ICD-10-CM

## 2020-03-26 DIAGNOSIS — Z79899 Other long term (current) drug therapy: Secondary | ICD-10-CM

## 2020-03-26 DIAGNOSIS — I2510 Atherosclerotic heart disease of native coronary artery without angina pectoris: Secondary | ICD-10-CM

## 2020-03-26 LAB — COMPREHENSIVE METABOLIC PANEL
ALT: 20 IU/L (ref 0–44)
AST: 23 IU/L (ref 0–40)
Albumin/Globulin Ratio: 2.1 (ref 1.2–2.2)
Albumin: 4.8 g/dL (ref 3.8–4.8)
Alkaline Phosphatase: 84 IU/L (ref 44–121)
BUN/Creatinine Ratio: 15 (ref 10–24)
BUN: 18 mg/dL (ref 8–27)
Bilirubin Total: 0.4 mg/dL (ref 0.0–1.2)
CO2: 22 mmol/L (ref 20–29)
Calcium: 9.8 mg/dL (ref 8.6–10.2)
Chloride: 100 mmol/L (ref 96–106)
Creatinine, Ser: 1.24 mg/dL (ref 0.76–1.27)
GFR calc Af Amer: 69 mL/min/{1.73_m2} (ref >59)
GFR calc non Af Amer: 59 mL/min/{1.73_m2} — ABNORMAL LOW (ref >59)
Globulin, Total: 2.3 g/dL (ref 1.5–4.5)
Glucose: 85 mg/dL (ref 65–99)
Potassium: 4.6 mmol/L (ref 3.5–5.2)
Sodium: 136 mmol/L (ref 134–144)
Total Protein: 7.1 g/dL (ref 6.0–8.5)

## 2020-03-26 LAB — CBC
Hematocrit: 44.9 % (ref 37.5–51.0)
Hemoglobin: 15.8 g/dL (ref 13.0–17.7)
MCH: 33.2 pg — ABNORMAL HIGH (ref 26.6–33.0)
MCHC: 35.2 g/dL (ref 31.5–35.7)
MCV: 94 fL (ref 79–97)
Platelets: 258 10*3/uL (ref 150–450)
RBC: 4.76 x10E6/uL (ref 4.14–5.80)
RDW: 12.2 % (ref 11.6–15.4)
WBC: 10.8 10*3/uL (ref 3.4–10.8)

## 2020-03-26 LAB — TSH: TSH: 2.82 u[IU]/mL (ref 0.450–4.500)

## 2020-03-26 NOTE — Patient Instructions (Addendum)
Medication Instructions:  The current medical regimen is effective;  continue present plan and medications.  *If you need a refill on your cardiac medications before your next appointment, please call your pharmacy*  Lab Work: Please have blood work today (CBC, TSH and CMP) If you have labs (blood work) drawn today and your tests are completely normal, you will receive your results only by: Marland Kitchen MyChart Message (if you have MyChart) OR . A paper copy in the mail If you have any lab test that is abnormal or we need to change your treatment, we will call you to review the results.  Testing/Procedures: Your physician has requested that you have a carotid duplex. This test is an ultrasound of the carotid arteries in your neck. It looks at blood flow through these arteries that supply the brain with blood. Allow one hour for this exam. There are no restrictions or special instructions.  Follow-Up: At [facility], you and your health needs are our priority.  As part of our continuing mission to provide you with exceptional heart care, we have created designated Provider Care Teams.  These Care Teams include your primary Cardiologist (physician) and Advanced Practice Providers (APPs -  Physician Assistants and Nurse Practitioners) who all work together to provide you with the care you need, when you need it.  We recommend signing up for the patient portal called "MyChart".  Sign up information is provided on this After Visit Summary.  MyChart is used to connect with patients for Virtual Visits (Telemedicine).  Patients are able to view lab/test results, encounter notes, upcoming appointments, etc.  Non-urgent messages can be sent to your provider as well.   To learn more about what you can do with MyChart, go to [email or web address].    Your next appointment:   6 month(s)  The format for your next appointment:   In Person  Provider:   Candee Furbish, MD   Thank you for choosing [facility]!!

## 2020-03-26 NOTE — Progress Notes (Signed)
Cardiology Office Note:    Date:  03/26/2020   ID:  [name], DOB May 12, 1950, MRN [id number]  PCP:  Dennis Koch, MD  [facility] Cardiologist:  Dennis Furbish, MD  [facility] Electrophysiologist:  None   Referring MD: [name], *     History of Present Illness:    [name] is a 70 y.o. male here for the follow-up of paroxysmal atrial fibrillation and coronary artery disease.  Dilated cardiomyopathy.  Prior flutter ablation 2012.  Had prior TEE cardioversion in 2017 with amiodarone.  Maintaining sinus rhythm.  EF 25 to 45%.  Cardiac catheterization 07/06/2010 showed chronic total occlusion of distal RCA with collaterals from right to left.  He underwent medical management.  Has had some trouble with hypotension in the past.  Overall he feels well having no difficulties no shortness of breath no chest pain no syncope.  Past Medical History:  Diagnosis Date  . Adenomatous colon polyp   . Alcohol use   . Antiphospholipid antibody positive    ???In the past???but not proven according to patient  . Antiphospholipid antibody positive    ??? in the past, but not proven according to the patient.  . Atrial flutter ([place])    atrial flutter ablation,   Dr.Allred October 18, 2010  . BPH (benign prostatic hypertrophy)   . CAD (coronary artery disease)    Catheterization 2005 disease / catheterization July 06, 2010, chronic total occlusion distal RCA with collaterals from right and left side.  Medical therapy recommended, mild decreased LV function       . Carotid artery disease (HCC)    [id number] R. ICA, [id number] LICA... Doppler.... January, 2010  /  Doppler... in January, 2011 no change  /  doppler1/27/2012...stable  [id number] RICA [id number] LICA  . CHF (congestive heart failure) ([place])   . CRAO (central retinal artery occlusion)   . Ejection fraction     EF 50-55%, echo, March, 2012... inferobasal and distal septal hypokinesis /      60%, echo, November, 2008  . GERD  (gastroesophageal reflux disease)   . Hypercholesterolemia   . Hypertension   . Myocardial infarction ([place])   . Nonsustained ventricular tachycardia ([place])   . Palpitations    probable SVT, rate 170,, at home,, lasted one hour,, 2012  . Prostate cancer ([place])   . Raynaud's syndrome   . TIA (transient ischemic attack)    Possible small vessel tia in the past.  . Tobacco abuse     Past Surgical History:  Procedure Laterality Date  . CARDIAC CATHETERIZATION  06/27/2003   2005.... mild irregularity of the LAD..( patient had had abnormal Myoview scan)  . CARDIOVERSION N/A 03/06/2016   Procedure: CARDIOVERSION;  Surgeon: Dennis Hector, MD;  Location[facility] ENDOSCOPY;  Service: Cardiovascular;  Laterality: N/A;  . INGUINAL HERNIA REPAIR  2009   right with mesh.  Dr [name]  . TEE WITHOUT CARDIOVERSION N/A 03/06/2016   Procedure: TRANSESOPHAGEAL ECHOCARDIOGRAM (TEE);  Surgeon: Dennis Hector, MD;  Location[facility] ENDOSCOPY;  Service: Cardiovascular;  Laterality: N/A;  . THYROGLOSSAL DUCT CYST  2006   Dr. Wilburn Cross    Current Medications: Current Meds  Medication Sig  . albuterol (VENTOLIN HFA) 108 (90 Base) MCG/ACT inhaler INHALE TWO PUFFS into THE lungs EVERY 4 HOURS AS NEEDED FOR SHORTNESS OF BREATH OR wheezing  . amiodarone (PACERONE) 200 MG tablet TAKE 1/2 TABLET(100 MG) BY MOUTH DAILY  . atorvastatin (LIPITOR) 40 MG tablet TAKE ONE  TABLET BY MOUTH ONCE DAILY  . finasteride (PROSCAR) 5 MG tablet Take 5 mg by mouth daily.  . folic acid (FOLVITE) 1 MG tablet TAKE 1 TABLET BY MOUTH EVERY DAY  . lisinopril (ZESTRIL) 10 MG tablet TAKE 1 TABLET(10 MG) BY MOUTH DAILY  . metoprolol succinate (TOPROL-XL) 50 MG 24 hr tablet TAKE ONE TABLET BY MOUTH ONCE DAILY WITH A MEAL OR immediately following  . Multiple Vitamin (MULTIVITAMIN WITH MINERALS) TABS tablet Take 1 tablet by mouth daily.  . Pyridoxine HCl (VITAMIN B6 PO) Take 1 tablet by mouth.  . rivaroxaban (XARELTO) 20 MG TABS tablet Take 1 tablet (20  mg total) by mouth daily.  . Tiotropium Bromide-Olodaterol (STIOLTO RESPIMAT) 2.5-2.5 MCG/ACT AERS Inhale 2 puffs into the lungs daily.     Allergies:   Cialis [tadalafil], Levitra [vardenafil], and Viagra [sildenafil citrate]   Social History   Socioeconomic History  . Marital status: Married    Spouse name: Not on file  . Number of children: Not on file  . Years of education: Not on file  . Highest education level: Not on file  Occupational History  . Occupation: land Teacher, English as a foreign language retired  Tobacco Use  . Smoking status: Current Every Day Smoker    Packs/day: 2.00    Years: 51.00    Pack years: 102.00    Types: Cigarettes    Start date: 17  . Smokeless tobacco: Never Used  . Tobacco comment: 11/30/19 .5-1pack per day  Vaping Use  . Vaping Use: Never used  Substance and Sexual Activity  . Alcohol use: Yes    Alcohol/week: 21.0 standard drinks    Types: 21 Cans of beer per week    Comment: 3-4 beers nightly  . Drug use: No  . Sexual activity: Not Currently  Other Topics Concern  . Not on file  Social History Narrative  . Not on file   Social Determinants of Health   Financial Resource Strain: Medium Risk  . Difficulty of Paying Living Expenses: Somewhat hard  Food Insecurity: Not on file  Transportation Needs: Not on file  Physical Activity: Not on file  Stress: Not on file  Social Connections: Not on file     Family History: The patient's family history is not on file. He was adopted.  ROS:   Please see the history of present illness.     All other systems reviewed and are negative.  EKGs/Labs/Other Studies Reviewed:    The following studies were reviewed today:  CT scan of chest lung cancer screening 04/27/2019: - Personally reviewed and interpreted shows aortic atherosclerosis as well as coronary artery calcification  ECHO 2021:  1. Left ventricular ejection fraction, by estimation, is 60 to 65%. The  left ventricle has normal function. The left  ventricle has no regional  wall motion abnormalities. Left ventricular diastolic parameters were  normal.  2. Right ventricular systolic function is normal. The right ventricular  size is normal.  3. The mitral valve is grossly normal. Trivial mitral valve  regurgitation.  4. The aortic valve is normal in structure. Aortic valve regurgitation is  not visualized. No aortic stenosis is present.   EKG:  EKG is not ordered today.  Prior EKG showed sinus rhythm 62 with nonspecific ST-T wave changes  Recent Labs: 09/22/2019: ALT 15; Hemoglobin 15.3; Platelets 236; TSH 2.990  Recent Lipid Panel    Component Value Date/Time   CHOL 124 09/22/2019 0903   TRIG 66 09/22/2019 0903   HDL 62 09/22/2019 0903  CHOLHDL 2.0 09/22/2019 0903   CHOLHDL 2 07/27/2017 0906   VLDL 12.2 07/27/2017 0906   LDLCALC 48 09/22/2019 0903     Physical Exam:    VS:  BP 128/68   Pulse 68   Ht 6\' 2"  (1.88 m)   Wt 168 lb (76.2 kg)   BMI 21.57 kg/m     Wt Readings from Last 3 Encounters:  03/26/20 168 lb (76.2 kg)  11/30/19 162 lb 12.8 oz (73.8 kg)  09/22/19 161 lb (73 kg)     GEN:  Well nourished, well developed in no acute distress HEENT: Normal NECK: No JVD; right-sided carotid bruits LYMPHATICS: No lymphadenopathy CARDIAC: RRR, no murmurs, rubs, gallops RESPIRATORY:  Clear to auscultation without rales, wheezing or rhonchi  ABDOMEN: Soft, non-tender, non-distended MUSCULOSKELETAL:  No edema; No deformity  SKIN: Warm and dry NEUROLOGIC:  Alert and oriented x 3 PSYCHIATRIC:  Normal affect   ASSESSMENT:    1. Paroxysmal atrial fibrillation (HCC)   2. Dilated cardiomyopathy ([place])   3. Pure hypercholesterolemia   4. Coronary artery disease involving native coronary artery of native heart without angina pectoris   5. On amiodarone therapy   6. Chronic systolic heart failure ([place])   7. Long-term use of high-risk medication   8. Bilateral carotid artery stenosis    PLAN:    In order of  problems listed above:  Paroxysmal atrial fibrillation - Continuing on amiodarone, continue to monitor liver function TSH.  No awareness of atrial fibrillation.  Chronic anticoagulation - On Xarelto no bleeding continue to monitor hemoglobin.  Ordering labs today.  Coronary artery disease with chronic total occlusion of RCA with collaterals - Prior stress test in 2015 showed no significant ischemia.  No recent angina.  Recent echocardiogram stable.  Aortic atherosclerosis - Continue with goal-directed therapy.  Creatinine 1.14 potassium 4.7  Dilated cardiomyopathy - EF has ranged between 20 and 45%.  Even though his echocardiogram was reported out at 60 to 65% EF, I personally reviewed and there are definitely some views that appear to be more in the 45% range as we were seeing previously.  Continuing with lisinopril and Toprol.  NYHA class I.  Hyperlipidemia - Continues with statin therapy.  LDL 48.  No myalgias.  TSH 2.9  Tobacco use - Half pack per day use.  Discussed the importance of stopping.  Carotid artery disease - Previously mild bilaterally internal carotid with 50% or greater right external carotid artery.  I will go ahead and repeat carotid Dopplers.  I do hear a bruit on the right side.  Continue with statin therapy.        Medication Adjustments/Labs and Tests Ordered: Current medicines are reviewed at length with the patient today.  Concerns regarding medicines are outlined above.  Orders Placed This Encounter  Procedures  . Comprehensive metabolic panel  . TSH  . VAS US CAROTID   No orders of the defined types were placed in this encounter.   Patient Instructions  Medication Instructions:  The current medical regimen is effective;  continue present plan and medications.  *If you need a refill on your cardiac medications before your next appointment, please call your pharmacy*  Lab Work: Please have blood work today (CBC, TSH and CMP) If you have labs  (blood work) drawn today and your tests are completely normal, you will receive your results only by: Marland Kitchen MyChart Message (if you have MyChart) OR . A paper copy in the mail If you have any lab  test that is abnormal or we need to change your treatment, we will call you to review the results.  Testing/Procedures: Your physician has requested that you have a carotid duplex. This test is an ultrasound of the carotid arteries in your neck. It looks at blood flow through these arteries that supply the brain with blood. Allow one hour for this exam. There are no restrictions or special instructions.  Follow-Up: At [facility], you and your health needs are our priority.  As part of our continuing mission to provide you with exceptional heart care, we have created designated Provider Care Teams.  These Care Teams include your primary Cardiologist (physician) and Advanced Practice Providers (APPs -  Physician Assistants and Nurse Practitioners) who all work together to provide you with the care you need, when you need it.  We recommend signing up for the patient portal called "MyChart".  Sign up information is provided on this After Visit Summary.  MyChart is used to connect with patients for Virtual Visits (Telemedicine).  Patients are able to view lab/test results, encounter notes, upcoming appointments, etc.  Non-urgent messages can be sent to your provider as well.   To learn more about what you can do with MyChart, go to [email or web address].    Your next appointment:   6 month(s)  The format for your next appointment:   In Person  Provider:   Candee Furbish, MD   Thank you for choosing [facility]!!        Signed, Dennis Furbish, MD  03/26/2020 10:19 AM    [place]

## 2020-04-05 ENCOUNTER — Ambulatory Visit (HOSPITAL_COMMUNITY)
Admission: RE | Admit: 2020-04-05 | Discharge: 2020-04-05 | Disposition: A | Discharge status: Home / Self Care | Payer: Medicare HMO | Source: Ambulatory Visit | Attending: Cardiology | Admitting: Cardiology

## 2020-04-05 ENCOUNTER — Other Ambulatory Visit: Payer: Self-pay

## 2020-04-05 DIAGNOSIS — I6523 Occlusion and stenosis of bilateral carotid arteries: Secondary | ICD-10-CM | POA: Insufficient documentation

## 2020-04-16 ENCOUNTER — Telehealth: Payer: Self-pay | Admitting: Pharmacist

## 2020-04-16 NOTE — Progress Notes (Signed)
° ° °  Chronic Care Management Pharmacy Assistant   Name: [name]  MRN: [id number] DOB: 26-Dec-1950  Reason for Encounter: Medication Review  Patient Questions:  1.  Have you seen any other providers since your last visit? Yes, patient last seen Dr. Candee Furbish on 03/26/2020  2.  Any changes in your medicines or health? No     PCP : Hoyt Koch, MD  Allergies:   Allergies  Allergen Reactions   Cialis [Tadalafil] Other (See Comments)    UNKNOWN   Levitra [Vardenafil] Other (See Comments)    UNKNOWN   Viagra [Sildenafil Citrate] Other (See Comments)    UNKNOWN    Medications: Outpatient Encounter Medications as of 04/16/2020  Medication Sig   albuterol (VENTOLIN HFA) 108 (90 Base) MCG/ACT inhaler INHALE TWO PUFFS into THE lungs EVERY 4 HOURS AS NEEDED FOR SHORTNESS OF BREATH OR wheezing   amiodarone (PACERONE) 200 MG tablet TAKE 1/2 TABLET(100 MG) BY MOUTH DAILY   atorvastatin (LIPITOR) 40 MG tablet TAKE ONE TABLET BY MOUTH ONCE DAILY   finasteride (PROSCAR) 5 MG tablet Take 5 mg by mouth daily.   folic acid (FOLVITE) 1 MG tablet TAKE 1 TABLET BY MOUTH EVERY DAY   lisinopril (ZESTRIL) 10 MG tablet TAKE 1 TABLET(10 MG) BY MOUTH DAILY   metoprolol succinate (TOPROL-XL) 50 MG 24 hr tablet TAKE ONE TABLET BY MOUTH ONCE DAILY WITH A MEAL OR immediately following   Multiple Vitamin (MULTIVITAMIN WITH MINERALS) TABS tablet Take 1 tablet by mouth daily.   Pyridoxine HCl (VITAMIN B6 PO) Take 1 tablet by mouth.   rivaroxaban (XARELTO) 20 MG TABS tablet Take 1 tablet (20 mg total) by mouth daily.   Tiotropium Bromide-Olodaterol (STIOLTO RESPIMAT) 2.5-2.5 MCG/ACT AERS Inhale 2 puffs into the lungs daily.   No facility-administered encounter medications on file as of 04/16/2020.    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  Patient requested an acute fill for Stiolto Aer and Albuterol Hfa  to be delivered: 04/16/2020 Pharmacy needs refills? No  Confirmed delivery date of 04/16/2020, advised patient that pharmacy will contact them the morning of delivery.  Wendy Poet, [place] [phone]

## 2020-04-17 ENCOUNTER — Ambulatory Visit: Payer: Medicare HMO

## 2020-04-17 NOTE — Progress Notes (Unsigned)
Chronic Care Management Pharmacy Note  04/17/2020 Name:  JARRICK FJELD MRN:  [id number] DOB:  [birth year]  Subjective: [name] is an 70 y.o. year old male who is a primary patient of Hoyt Koch, MD.  The CCM team was consulted for assistance with disease management and care coordination needs.    Engaged with patient by telephone for follow up visit in response to provider referral for pharmacy case management and/or care coordination services.   Consent to Services:  The patient was given the following information about Chronic Care Management services today, agreed to services, and gave verbal consent: 1. CCM service includes personalized support from designated clinical staff supervised by the primary care provider, including individualized plan of care and coordination with other care providers 2. 24/7 contact phone numbers for assistance for urgent and routine care needs. 3. Service will only be billed when office clinical staff spend 20 minutes or more in a month to coordinate care. 4. Only one practitioner may furnish and bill the service in a calendar month. 5.The patient may stop CCM services at any time (effective at the end of the month) by phone call to the office staff. 6. The patient will be responsible for cost sharing (co-pay) of up to 20% of the service fee (after annual deductible is met). Patient agreed to services and consent obtained.  Patient Care Team: Hoyt Koch, MD as PCP - General (Internal Medicine) Jerline Pain, MD as PCP - Cardiology (Cardiology) Alexander Mt (Inactive) as Physician Assistant (Neurology) Michael Boston, MD as Attending Physician (General Surgery) Jerline Pain, MD as Consulting Physician (Cardiology) Charlton Haws, [facility] as Pharmacist (Pharmacist)  Recent office visits: 04/07/19 Dr Sharlet Salina [facility]: cough/SOB, rx'd prednisone and doxycyline for COPD exacerbation. Referred for lung cancer screening  Recent consult  visits: 03/26/20 Dr [name] (cardiology): f/u Afib, HF, CAD. No med changes. Repeat carotid dopplers.  02/06/20 Dr Jeffie Pollock (urology): f/u for prostate cancer, BPH.  11/30/19 NP Matt Hunsucker (pulmonary): COPD, no med changes. Smoking cessation counseling - precontemplative.  Objective:  Lab Results  Component Value Date   CREATININE 1.24 03/26/2020   BUN 18 03/26/2020   GFR 67.62 07/27/2017   GFRNONAA 59 (L) 03/26/2020   GFRAA 69 03/26/2020   NA 136 03/26/2020   K 4.6 03/26/2020   CALCIUM 9.8 03/26/2020   CO2 22 03/26/2020    Lab Results  Component Value Date/Time   GFR 67.62 07/27/2017 09:06 AM   GFR 81.94 03/06/2015 08:31 AM    Last diabetic Eye exam: No results found for: HMDIABEYEEXA  Last diabetic Foot exam: No results found for: HMDIABFOOTEX   Lab Results  Component Value Date   CHOL 124 09/22/2019   HDL 62 09/22/2019   LDLCALC 48 09/22/2019   TRIG 66 09/22/2019   CHOLHDL 2.0 09/22/2019    Hepatic Function Latest Ref Rng & Units 03/26/2020 09/22/2019 08/18/2018  Total Protein 6.0 - 8.5 g/dL 7.1 6.9 6.3  Albumin 3.8 - 4.8 g/dL 4.8 4.5 4.4  AST 0 - 40 IU/L _0 ALT 0 - 44 IU/L _1 Alk Phosphatase 44 - 121 IU/L 84 88 68  Total Bilirubin 0.0 - 1.2 mg/dL 0.4 0.5 0.4  Bilirubin, Direct 0.00 - 0.40 mg/dL - 0.19 -    Lab Results  Component Value Date/Time   TSH 2.820 03/26/2020 10:15 AM   TSH 2.990 09/22/2019 09:03 AM   FREET4 1.46 08/18/2018 10:25 AM   FREET4 1.74  01/09/2017 08:40 AM    CBC Latest Ref Rng & Units 03/26/2020 09/22/2019 08/18/2018  WBC 3.4 - 10.8 x10E3/uL 10.8 8.0 10.0  Hemoglobin 13.0 - 17.7 g/dL 15.8 15.3 14.7  Hematocrit 37.5 - 51.0 % 44.9 45.3 43.2  Platelets 150 - 450 x10E3/uL 258 236 244    Lab Results  Component Value Date/Time   VD25OH 59 10/02/2011 09:41 AM    Clinical ASCVD: Yes  The ASCVD Risk score (Goff DC Jr., et al., 2013) failed to calculate for the following reasons:   The valid total cholesterol range is 130 to 320  mg/dL    CHA2DS2-VASc Score = 4  The patient's score is based upon: CHF History: Yes HTN History: Yes Diabetes History: No Stroke History: No Vascular Disease History: Yes Age Score: 1 Gender Score: 0  BP Readings from Last 3 Encounters:  03/26/20 128/68  11/30/19 130/64  09/22/19 130/68   Pulse Readings from Last 3 Encounters:  03/26/20 68  11/30/19 60  09/22/19 63   Wt Readings from Last 3 Encounters:  03/26/20 168 lb (76.2 kg)  11/30/19 162 lb 12.8 oz (73.8 kg)  09/22/19 161 lb (73 kg)    Assessment/Interventions: Review of patient past medical history, allergies, medications, health status, including review of consultants reports, laboratory and other test data, was performed as part of comprehensive evaluation and provision of chronic care management services.   SDOH:  (Social Determinants of Health) assessments and interventions performed:    CCM Care Plan  Allergies  Allergen Reactions  . Cialis [Tadalafil] Other (See Comments)    UNKNOWN  . Levitra [Vardenafil] Other (See Comments)    UNKNOWN  . Viagra [Sildenafil Citrate] Other (See Comments)    UNKNOWN    Medications Reviewed Today    Reviewed by Pfohl, Concetta (Clinical Support Staff) on 03/26/20 at 0947  Med List Status: <None>  Medication Order Taking? Sig Documenting Provider Last Dose Status Informant  albuterol (VENTOLIN HFA) 108 (90 Base) MCG/ACT inhaler [id number] Yes INHALE TWO PUFFS into THE lungs EVERY 4 HOURS AS NEEDED FOR SHORTNESS OF BREATH OR wheezing Crawford, Elizabeth A, MD Taking Active   amiodarone (PACERONE) 200 MG tablet [id number] Yes TAKE 1/2 TABLET(100 MG) BY MOUTH DAILY Skains, Mark C, MD Taking Active   atorvastatin (LIPITOR) 40 MG tablet [id number] Yes TAKE ONE TABLET BY MOUTH ONCE DAILY Crawford, Elizabeth A, MD Taking Active   finasteride (PROSCAR) 5 MG tablet [id number] Yes Take 5 mg by mouth daily. [provider] Taking Active   folic acid (FOLVITE) 1 MG tablet  [id number] Yes TAKE 1 TABLET BY MOUTH EVERY DAY Skains, Mark C, MD Taking Active   lisinopril (ZESTRIL) 10 MG tablet [id number] Yes TAKE 1 TABLET(10 MG) BY MOUTH DAILY Crawford, Elizabeth A, MD Taking Active   metoprolol succinate (TOPROL-XL) 50 MG 24 hr tablet [id number] Yes TAKE ONE TABLET BY MOUTH ONCE DAILY WITH A MEAL OR immediately following Skains, Mark C, MD Taking Active   Multiple Vitamin (MULTIVITAMIN WITH MINERALS) TABS tablet [id number] Yes Take 1 tablet by mouth daily. Mikhail, Maryann, DO Taking Active   Pyridoxine HCl (VITAMIN B6 PO) [id number] Yes Take 1 tablet by mouth. [provider] Taking Active Self  rivaroxaban (XARELTO) 20 MG TABS tablet [id number] Yes Take 1 tablet (20 mg total) by mouth daily. Skains, Mark C, MD Taking Active   Tiotropium Bromide-Olodaterol (STIOLTO RESPIMAT) 2.5-2.5 MCG/ACT AERS [id number] Yes Inhale 2 puffs into the lungs daily. Crawford, Elizabeth A, MD Taking Active             Patient Active Problem List   Diagnosis Date Noted  . Chronic systolic heart failure (HCC) 09/22/2019  . Protein in urine 02/03/2018  . Centrilobular emphysema (HCC) 05/06/2016  . CHF (congestive heart failure) (HCC) 03/05/2016  . Routine general medical examination at a health care facility 02/28/2014  . Paroxysmal atrial fibrillation (HCC) 12/05/2013  . Nonsustained ventricular tachycardia (HCC)   . 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HCC) 04/22/2013  . Atrial fibrillation with RVR (HCC)   . CAD (coronary artery disease)   . Raynaud's syndrome   . CRAO (central retinal artery occlusion)   . Carotid artery disease (HCC)   . TOBACCO ABUSE 04/12/2009  . BPH (benign prostatic hyperplasia) 12/19/2008  . THYROID CYST 07/05/2007  . Hyperlipidemia 07/05/2007  . Alcohol abuse 07/05/2007  . Essential hypertension 07/05/2007  . GERD 07/05/2007    Immunization History  Administered Date(s) Administered  . Fluad Quad(high Dose 65+) 01/27/2019  . Influenza, High Dose Seasonal  PF 01/13/2018  . PFIZER(Purple Top)SARS-COV-2 Vaccination 04/23/2019, 05/18/2019  . Pneumococcal Conjugate-13 05/06/2016  . Pneumococcal Polysaccharide-23 07/27/2017  . Tdap 02/27/2014    Conditions to be addressed/monitored:  {CCM ASSESSMENT DZ OPTIONS:25047}  There are no care plans to display for this patient.   Medication Assistance: {MEDASSISTANCEINFO:25044}  Patient's preferred pharmacy is:  [facility] - [place], [place] - [address] [place] [address] Phone: [phone] Fax: [phone]  Uses pill box? {Yes or If no, why not?:20788} Pt endorses ***% compliance  We discussed: {Pharmacy options:24294}  Plan: {US Pharmacy Plan:23885}    Follow Up:  {FOLLOWUP:24991}  Plan: {CM FOLLOW UP PLAN:25073}  ***    Current Barriers:  . {pharmacybarriers:24917} . ***  Pharmacist Clinical Goal(s):  . Over the next *** days, patient will {PHARMACYGOALCHOICES:24921} through collaboration with PharmD and provider.  . ***  Interventions: . 1:1 collaboration with Crawford, Elizabeth A, MD regarding development and update of comprehensive plan of care as evidenced by provider attestation and co-signature . Inter-disciplinary care team collaboration (see longitudinal plan of care) . Comprehensive medication review performed; medication list updated in electronic medical record  Hypertension (BP goal <130/80) -controlled -Current treatment: . Lisinopril 10 mg daily . Metoprolol succinate 50 mg daily -Current home readings: *** -Current dietary habits: *** -Current exercise habits: *** -{ACTIONS;DENIES/REPORTS:21021675::"Denies"} hypotensive/hypertensive symptoms -Educated on {CCM BP Counseling:25124} -Counseled to monitor BP at home ***, document, and provide log at future appointments -{CCMPHARMDINTERVENTION:25122}  Hyperlipidemia/ CAD (LDL goal < 70) -controlled -Current treatment: . Atorvastatin 40  mg daily -Educated on {CCM HLD Counseling:25126} -{CCMPHARMDINTERVENTION:25122}  Atrial Fibrillation (Goal: prevent stroke and major bleeding) -{CHL Controlled/Uncontrolled:2109141014}  -CHADSVASC: 4 -Current treatment: . Rate control: metoprolol succinate 50 mg, amiodarone 200 mg - 1/2 tab daily . Anticoagulation: Xarelto 20 mg dialy -Home BP and HR readings: ***  -Counseled on {CCMAFIBCOUNSELING:25120} -{CCMPHARMDINTERVENTION:25122}  Heart Failure (Goal: control symptoms and prevent exacerbations) {CHL Controlled/Uncontrolled:2109141014} Type: Diastolic -NYHA Class: I (no actitivty limitation) -Ejection fraction: 60-65% (Date: 10/19/2019) -Current treatment:  Lisinopril 10 mg daily  Metoprolol succinate 50 mg daily -Educated on {CCM HF Counseling:25125} -{CCMPHARMDINTERVENTION:25122}  COPD (Goal: control symptoms and prevent exacerbations) -{CHL Controlled/Uncontrolled:2109141014} -Current treatment  . Stiolto 2.5-2.5 mcg/act 2 puffs daily -Medications previously tried: Anoro  -Gold Grade: Gold 3 (FEV1 30-49%) -Current COPD Classification:  A (low sx, <2 exacerbations/yr) -MMRC/CAT score: *** -Pulmonary function testing: FEV1 42% predicted, FEV1/FVC 0.72 -Exacerbations requiring treatment in last 6 months: 0 -Patient reports consistent use of maintenance inhaler -Frequency of rescue   inhaler use: rare -Counseled on {CCMINHALERCOUNSELING:25121} -{CCMPHARMDINTERVENTION:25122}  Tobacco use (Goal ***) -{CHL Controlled/Uncontrolled:[phone]} -Patient smokes {Time to first cigarette:23873} -Patient triggers include: {Smoking Triggers:23882} -reports MOTIVATION to quit is high -reports CONFIDENCE in quitting is low -Previous quit attempts: n/a -{Smoking Cessation Counseling:23883} -{CCMPHARMDINTERVENTION:25122}  Patient Goals/Self-Care Activities . Over the next *** days, patient will:  - {pharmacypatientgoals:24919}  Follow Up Plan: {CM FOLLOW UP CHYI:50277}

## 2020-04-24 DIAGNOSIS — R972 Elevated prostate specific antigen [PSA]: Secondary | ICD-10-CM | POA: Diagnosis not present

## 2020-04-30 ENCOUNTER — Telehealth: Payer: Self-pay | Admitting: Cardiology

## 2020-04-30 ENCOUNTER — Other Ambulatory Visit: Payer: Self-pay | Admitting: Urology

## 2020-04-30 DIAGNOSIS — N401 Enlarged prostate with lower urinary tract symptoms: Secondary | ICD-10-CM | POA: Diagnosis not present

## 2020-04-30 DIAGNOSIS — R351 Nocturia: Secondary | ICD-10-CM | POA: Diagnosis not present

## 2020-04-30 DIAGNOSIS — R3912 Poor urinary stream: Secondary | ICD-10-CM | POA: Diagnosis not present

## 2020-04-30 DIAGNOSIS — R972 Elevated prostate specific antigen [PSA]: Secondary | ICD-10-CM | POA: Diagnosis not present

## 2020-04-30 DIAGNOSIS — C61 Malignant neoplasm of prostate: Secondary | ICD-10-CM | POA: Diagnosis not present

## 2020-04-30 NOTE — Telephone Encounter (Signed)
° °  [facility] Pre-operative Risk Assessment    HEARTCARE STAFF: - Please ensure there is not already an duplicate clearance open for this procedure. - Under Visit Info/Reason for Call, type in Other and utilize the format Clearance MM/DD/YY or Clearance TBD. Do not use dashes or single digits. - If request is for dental extraction, please clarify the # of teeth to be extracted.  Request for surgical clearance:  1. What type of surgery is being performed?  TRANSURETHRAL RESECTION OF THE PROSTATE  TURP  2. When is this surgery scheduled? 05/15/2020  3. What type of clearance is required (medical clearance vs. Pharmacy clearance to hold med vs. Both)? Both   4. Are there any medications that need to be held prior to surgery and how long?   rivaroxaban (XARELTO) 20 MG TABS tablet [[id number]]   5. Practice name and name of physician performing surgery? [facility]  6. What is the office phone number? [place]   7.   What is the office fax number? [phone]  8.   Anesthesia type (None, local, MAC, general) ? General   [name] 04/30/2020, 10:36 AM  _________________________________________________________________   (provider comments below)

## 2020-05-01 ENCOUNTER — Other Ambulatory Visit: Payer: Self-pay | Admitting: Cardiology

## 2020-05-01 DIAGNOSIS — I4891 Unspecified atrial fibrillation: Secondary | ICD-10-CM

## 2020-05-01 NOTE — Telephone Encounter (Signed)
Patient with diagnosis of afib on Xarelto for anticoagulation.    Procedure: TRANSURETHRAL RESECTION OF THE PROSTATE  TURP Date of procedure: 05/15/20  CHA2DS2-VASc Score = 6  This indicates a 9.7% annual risk of stroke. The patient's score is based upon: CHF History: Yes HTN History: Yes Diabetes History: No Stroke History: Yes Vascular Disease History: Yes Age Score: 1 Gender Score: 0   Also has antiphospholipid antibody positive listed on PMH but then "not proven according to patient" listed below.  CrCl 43mL/min Platelet count 258K  Typically require 2 day DOAC hold prior to TURP. In setting of elevated CV risk, will defer to MD for input.

## 2020-05-01 NOTE — Telephone Encounter (Signed)
Sending to PharmD for rec's re: anticoagulation. Richardson Dopp, PA-C    05/01/2020 12:03 PM

## 2020-05-01 NOTE — Progress Notes (Signed)
DUE TO COVID-19 ONLY ONE VISITOR IS ALLOWED TO COME WITH YOU AND STAY IN THE WAITING ROOM ONLY DURING PRE OP AND PROCEDURE DAY OF SURGERY. THE 1 VISITOR  MAY VISIT WITH YOU AFTER SURGERY IN YOUR PRIVATE ROOM DURING VISITING HOURS ONLY!  YOU NEED TO HAVE A COVID 19 TEST ON___2/25/2022 ____ @_______ , THIS TEST MUST BE DONE BEFORE SURGERY,  COVID TESTING SITE [address] [place] [address], IT IS ON THE RIGHT GOING OUT [address] APPROXIMATELY  2 MINUTES PAST [employer] ON THE RIGHT. ONCE YOUR COVID TEST IS COMPLETED,  PLEASE BEGIN THE QUARANTINE INSTRUCTIONS AS OUTLINED IN YOUR HANDOUT.                [name]  05/01/2020   Your procedure is scheduled on: 05/15/2020   Report to [facility] Main  Entrance   Report to admitting at   0700 AM     Call this number if you have problems the morning of surgery [phone]    Remember: Do not eat food , candy gum or mints :After Midnight. You may have clear liquids from midnight until 0600AM     CLEAR LIQUID DIET   Foods Allowed                                                                       Coffee and tea, regular and decaf                              Plain Jell-O any favor except red or purple                                            Fruit ices (not with fruit pulp)                                      Iced Popsicles                                     Carbonated beverages, regular and diet                                    Cranberry, grape and apple juices Sports drinks like Gatorade Lightly seasoned clear broth or consume(fat free) Sugar, honey syrup   _____________________________________________________________________    BRUSH YOUR TEETH MORNING OF SURGERY AND RINSE YOUR MOUTH OUT, NO CHEWING GUM CANDY OR MINTS.     Take these medicines the morning of surgery with A SIP OF WATER: iNHALERS AS USUAL AND BRING, AMIODARONE, PROSCAR, TOPRO L  DO NOT TAKE ANY DIABETIC MEDICATIONS DAY  OF YOUR SURGERY                               You may not  have any metal on your body including hair pins and              piercings  Do not wear jewelry, make-up, lotions, powders or perfumes, deodorant             Do not wear nail polish on your fingernails.  Do not shave  48 hours prior to surgery.              Men may shave face and neck.   Do not bring valuables to the hospital. [place].  Contacts, dentures or bridgework may not be worn into surgery.  Leave suitcase in the car. After surgery it may be brought to your room.     Patients discharged the day of surgery will not be allowed to drive home. IF YOU ARE HAVING SURGERY AND GOING HOME THE SAME DAY, YOU MUST HAVE AN ADULT TO DRIVE YOU HOME AND BE WITH YOU FOR 24 HOURS. YOU MAY GO HOME BY TAXI OR UBER OR ORTHERWISE, BUT AN ADULT MUST ACCOMPANY YOU HOME AND STAY WITH YOU FOR 24 HOURS.  Name and phone number of your driver:  Special Instructions: N/A              Please read over the following fact sheets you were given: _____________________________________________________________________  [facility] - Preparing for Surgery Before surgery, you can play an important role.  Because skin is not sterile, your skin needs to be as free of germs as possible.  You can reduce the number of germs on your skin by washing with CHG (chlorahexidine gluconate) soap before surgery.  CHG is an antiseptic cleaner which kills germs and bonds with the skin to continue killing germs even after washing. Please DO NOT use if you have an allergy to CHG or antibacterial soaps.  If your skin becomes reddened/irritated stop using the CHG and inform your nurse when you arrive at Short Stay. Do not shave (including legs and underarms) for at least 48 hours prior to the first CHG shower.  You may shave your face/neck. Please follow these instructions carefully:  1.  Shower with CHG Soap the night before surgery  and the  morning of Surgery.  2.  If you choose to wash your hair, wash your hair first as usual with your  normal  shampoo.  3.  After you shampoo, rinse your hair and body thoroughly to remove the  shampoo.                           4.  Use CHG as you would any other liquid soap.  You can apply chg directly  to the skin and wash                       Gently with a scrungie or clean washcloth.  5.  Apply the CHG Soap to your body ONLY FROM THE NECK DOWN.   Do not use on face/ open                           Wound or open sores. Avoid contact with eyes, ears mouth and genitals (private parts).  Wash face,  Genitals (private parts) with your normal soap.             6.  Wash thoroughly, paying special attention to the area where your surgery  will be performed.  7.  Thoroughly rinse your body with warm water from the neck down.  8.  DO NOT shower/wash with your normal soap after using and rinsing off  the CHG Soap.                9.  Pat yourself dry with a clean towel.            10.  Wear clean pajamas.            11.  Place clean sheets on your bed the night of your first shower and do not  sleep with pets. Day of Surgery : Do not apply any lotions/deodorants the morning of surgery.  Please wear clean clothes to the hospital/surgery center.  FAILURE TO FOLLOW THESE INSTRUCTIONS MAY RESULT IN THE CANCELLATION OF YOUR SURGERY PATIENT SIGNATURE_________________________________  NURSE SIGNATURE__________________________________  ________________________________________________________________________

## 2020-05-01 NOTE — Telephone Encounter (Signed)
Xarelto 20mg  refill request received. Pt is 70 years old, weight-76.2kg, Crea-1.24 on 03/26/20, last seen by Dr. [name] on 03/26/20, Diagnosis-Afib, Rose.81ml/min; Dose is appropriate based on dosing criteria. Will send in refill to requested pharmacy.

## 2020-05-02 NOTE — Telephone Encounter (Signed)
   Primary Cardiologist: Dennis Furbish, MD  Chart reviewed as part of pre-operative protocol coverage.   Hx:  Paroxysmal AFib, Amio Rx, CAD (known CTO of RCA by cath in 2012), HFrEF (EF ~45%), AFlutter s/p ablation, ?antiphospholipid Ab+, carotid artery dz, HTN, HLD, prior TIA, +cigs. Carotid US 04/05/2020: Bilateral ICA 1-39 Echo 10/19/2019: EF 60-65 (personally reviewed by Dr. Marlou Cross and felt to be consistent with EF~45%) no significant valve disease Myoview 11/25/2013: EF 50 to, inferior infarct, no ischemia; low risk Cath 06/2010: dRCA 100, pOM 30, D1 60  Last seen by Dr. Marlou Cross:  03/26/2020  RCRI:  Perioperative Risk of Major Cardiac Event is (%): 11 (high risk) DASI:  Functional Capacity in METs is: 4.86 (functional status is good )  Patient was contacted 05/02/2020 in reference to pre-operative risk assessment for pending surgery as outlined below.    Since last seen, [name] has done well without chest pain, shortness of breath, syncope, orthopnea.  Recommendations:  Therefore, based on ACC/AHA guidelines, the patient would be at acceptable risk for the planned procedure without further cardiovascular testing.   The patient may hold his rivaroxaban (Xarelto) for 2 days prior to his procedure. It should be resumed postoperatively as soon as it is felt to be safe from a bleeding perspective.  Please call with questions. Dennis Dopp, PA-C 05/02/2020, 2:52 PM

## 2020-05-02 NOTE — Telephone Encounter (Signed)
thanks

## 2020-05-02 NOTE — Telephone Encounter (Signed)
He may stop Xarelto 2 days prior to TURP. Dr. Jeffie Pollock would like for him to be off of Xarelto for one week post TURP. I am fine with him proceeding with these instructions, understanding that he is at small increased stroke risk off of anticoagulation.  Candee Furbish, MD

## 2020-05-02 NOTE — Telephone Encounter (Signed)
Notes faxed to surgeon. This phone note will be removed from the preop pool. Richardson Dopp, PA-C  05/02/2020 2:54 PM

## 2020-05-03 ENCOUNTER — Encounter (HOSPITAL_COMMUNITY): Payer: Self-pay

## 2020-05-03 ENCOUNTER — Other Ambulatory Visit: Payer: Self-pay

## 2020-05-03 ENCOUNTER — Encounter (HOSPITAL_COMMUNITY)
Admission: RE | Admit: 2020-05-03 | Discharge: 2020-05-03 | Disposition: A | Discharge status: Home / Self Care | Payer: Medicare HMO | Source: Ambulatory Visit | Attending: Urology | Admitting: Urology

## 2020-05-03 DIAGNOSIS — Z8673 Personal history of transient ischemic attack (TIA), and cerebral infarction without residual deficits: Secondary | ICD-10-CM | POA: Insufficient documentation

## 2020-05-03 DIAGNOSIS — N401 Enlarged prostate with lower urinary tract symptoms: Secondary | ICD-10-CM | POA: Diagnosis not present

## 2020-05-03 DIAGNOSIS — N138 Other obstructive and reflux uropathy: Secondary | ICD-10-CM | POA: Insufficient documentation

## 2020-05-03 DIAGNOSIS — I480 Paroxysmal atrial fibrillation: Secondary | ICD-10-CM | POA: Diagnosis not present

## 2020-05-03 DIAGNOSIS — F17210 Nicotine dependence, cigarettes, uncomplicated: Secondary | ICD-10-CM | POA: Insufficient documentation

## 2020-05-03 DIAGNOSIS — Z7901 Long term (current) use of anticoagulants: Secondary | ICD-10-CM | POA: Insufficient documentation

## 2020-05-03 DIAGNOSIS — I5022 Chronic systolic (congestive) heart failure: Secondary | ICD-10-CM | POA: Diagnosis not present

## 2020-05-03 DIAGNOSIS — I2510 Atherosclerotic heart disease of native coronary artery without angina pectoris: Secondary | ICD-10-CM | POA: Insufficient documentation

## 2020-05-03 DIAGNOSIS — J449 Chronic obstructive pulmonary disease, unspecified: Secondary | ICD-10-CM | POA: Insufficient documentation

## 2020-05-03 DIAGNOSIS — I110 Hypertensive heart disease with heart failure: Secondary | ICD-10-CM | POA: Diagnosis not present

## 2020-05-03 DIAGNOSIS — Z01812 Encounter for preprocedural laboratory examination: Secondary | ICD-10-CM | POA: Insufficient documentation

## 2020-05-03 DIAGNOSIS — K219 Gastro-esophageal reflux disease without esophagitis: Secondary | ICD-10-CM | POA: Diagnosis not present

## 2020-05-03 DIAGNOSIS — Z79899 Other long term (current) drug therapy: Secondary | ICD-10-CM | POA: Insufficient documentation

## 2020-05-03 HISTORY — DX: Chronic obstructive pulmonary disease, unspecified: J44.9

## 2020-05-03 HISTORY — DX: Cerebral infarction, unspecified: I63.9

## 2020-05-03 LAB — CBC
HCT: 48.9 % (ref 39.0–52.0)
Hemoglobin: 16.7 g/dL (ref 13.0–17.0)
MCH: 32.9 pg (ref 26.0–34.0)
MCHC: 34.2 g/dL (ref 30.0–36.0)
MCV: 96.3 fL (ref 80.0–100.0)
Platelets: 268 10*3/uL (ref 150–400)
RBC: 5.08 MIL/uL (ref 4.22–5.81)
RDW: 12.7 % (ref 11.5–15.5)
WBC: 9.2 10*3/uL (ref 4.0–10.5)
nRBC: 0 % (ref 0.0–0.2)

## 2020-05-03 LAB — BASIC METABOLIC PANEL
Anion gap: 8 (ref 5–15)
BUN: 19 mg/dL (ref 8–23)
CO2: 29 mmol/L (ref 22–32)
Calcium: 10 mg/dL (ref 8.9–10.3)
Chloride: 101 mmol/L (ref 98–111)
Creatinine, Ser: 1.09 mg/dL (ref 0.61–1.24)
GFR, Estimated: >60 mL/min (ref >60)
Glucose, Bld: 97 mg/dL (ref 70–99)
Potassium: 4.9 mmol/L (ref 3.5–5.1)
Sodium: 138 mmol/L (ref 135–145)

## 2020-05-03 NOTE — Progress Notes (Signed)
Called pt because at preop appt pt was unsure of when took meds.  Clarified with pt and put in med list.  Pt to use inhalers as usual and bring, and to take amiodarone, proscar and toprol am of surgery.  Pt voiced understanding.

## 2020-05-03 NOTE — Progress Notes (Addendum)
Anesthesia Review:  PCP: Dr Sharlet Salina-   [place] 04/07/2019  Cardiologist : DR Candee Furbish Pulmonary- DR Larey Days 11/30/2019  In telephone encounter note dated 05/02/20- card clearance per Margaret Pyle  03/26/20- [place] [facility]  Chest x-ray : 01/27/20- CT chest  EKG :09/22/19  Echo :8/21  Stress test: Cardiac Cath :  Activity level: can do a flight of stairs without difficulty  Sleep Study/ CPAP : no  Fasting Blood Sugar :      / Checks Blood Sugar -- times a day:   Blood Thinner/ Instructions /Last Dose: ASA / Instructions/ Last Dose :  Xarelto- per pt cardiology stated stop 2 days prior to surgery per Dr Jeffie Pollock stop 3 days prior to surgery.,Pt instructed to call office of Dr Jeffie Pollock regarding clarification of Xarelto instructions. Pt voiced understanding.

## 2020-05-04 NOTE — Progress Notes (Signed)
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 current every day smoker with h/o GERD, CAD, CHF, PAF (on Xarelto), stroke, COPD, BPH with bladder outlet obstruction scheduled for above procedure 05/15/2020 with Dr. Irine Seal.   Per cardiology preoperative evaluation 05/02/2020, "Hx:  Paroxysmal AFib, Amio Rx, CAD (known CTO of RCA by cath in 2012), HFrEF (EF ~45%), AFlutter s/p ablation, ?antiphospholipid Ab+, carotid artery dz, HTN, HLD, prior TIA, +cigs. Carotid US 04/05/2020: Bilateral ICA 1-39 Echo 10/19/2019: EF 60-65 (personally reviewed by Dr. [name] and felt to be consistent with EF~45%) no significant valve disease Myoview 11/25/2013: EF 50 to, inferior infarct, no ischemia; low risk Cath 06/2010: dRCA 100, pOM 30, D1 61 Last seen by Dr. [name]:  03/26/2020 RCRI:  Perioperative Risk of Major Cardiac Event is (%): 11 (high risk) DASI:  Functional Capacity in METs is: 4.86 (functional status is good ) Patient was contacted 05/02/2020 in reference to pre-operative risk assessment for pending surgery as outlined below.   Since last seen, [name] has done well without chest pain, shortness of breath, syncope, orthopnea. Recommendations:  Therefore, based on ACC/AHA guidelines, the patient would be at acceptable risk for the planned procedure without further cardiovascular testing.   The patient may hold his rivaroxaban (Xarelto) for 2 days prior to his procedure. It should be resumed postoperatively as soon as it is felt to be safe from a bleeding perspective."  Anticipate pt can proceed with planned procedure barring acute status change.   VS: BP (!) 150/71   Pulse 68   Temp 36.6 C (Oral)   Resp 16    SpO2 100%   PROVIDERS: Hoyt Koch, MD is PCP   Candee Furbish, MD is Cardiologist   Larey Days, MD is Cardiologist  LABS: Labs reviewed: Acceptable for surgery. (all labs ordered are listed, but only abnormal results are displayed)  Labs Reviewed  BASIC METABOLIC PANEL  CBC     IMAGES:   EKG: 09/22/2019 Rate 62 bpm   CV: Echo 10/19/2019 IMPRESSIONS    1. Left ventricular ejection fraction, by estimation, is 60 to 65%. The  left ventricle has normal function. The left ventricle has no regional  wall motion abnormalities. Left ventricular diastolic parameters were  normal.  2. Right ventricular systolic function is normal. The right ventricular  size is normal.  3. The mitral valve is grossly normal. Trivial mitral valve  regurgitation.  4. The aortic valve is normal in structure. Aortic valve regurgitation is  not visualized. No aortic stenosis is present.  Past Medical History:  Diagnosis Date  . Adenomatous colon polyp   . Alcohol use   . Antiphospholipid antibody positive    ???In the past???but not proven according to patient  . Antiphospholipid antibody positive    ??? in the past, but not proven according to the patient.  . Atrial flutter ([place])    atrial flutter ablation,   Dr.Allred October 18, 2010  . BPH (benign prostatic hypertrophy)   . CAD (coronary artery disease)    Catheterization 2005 disease / catheterization July 06, 2010, chronic total occlusion distal RCA with collaterals from right and left side.  Medical therapy recommended, mild decreased LV function       .  Carotid artery disease (HCC)    5-95% R. ICA, 63-87% LICA... Doppler.... January, 2010  /  Doppler... in January, 2011 no change  /  doppler1/27/2012...stable  5-64% RICA 33-29% LICA  . CHF (congestive heart failure) ([place])   . COPD (chronic obstructive pulmonary disease) ([place])   . CRAO (central retinal artery occlusion)   . Ejection fraction     EF 50-55%, echo,  March, 2012... inferobasal and distal septal hypokinesis /      60%, echo, November, 2008  . GERD (gastroesophageal reflux disease)   . Hypercholesterolemia   . Hypertension   . Myocardial infarction ([place])   . Nonsustained ventricular tachycardia ([place])   . Palpitations    probable SVT, rate 170,, at home,, lasted one hour,, 2012  . Prostate cancer ([place])   . Raynaud's syndrome   . Stroke [facility])    mild stroke per pt   . TIA (transient ischemic attack)    Possible small vessel tia in the past.  . Tobacco abuse     Past Surgical History:  Procedure Laterality Date  . CARDIAC CATHETERIZATION  06/27/2003   2005.... mild irregularity of the LAD..( patient had had abnormal Myoview scan)  . CARDIOVERSION N/A 03/06/2016   Procedure: CARDIOVERSION;  Surgeon: Josue Hector, MD;  Location: [facility] ENDOSCOPY;  Service: Cardiovascular;  Laterality: N/A;  . INGUINAL HERNIA REPAIR  2009   right with mesh.  Dr Marlou Starks  . TEE WITHOUT CARDIOVERSION N/A 03/06/2016   Procedure: TRANSESOPHAGEAL ECHOCARDIOGRAM (TEE);  Surgeon: Josue Hector, MD;  Location: [facility] ENDOSCOPY;  Service: Cardiovascular;  Laterality: N/A;  . THYROGLOSSAL DUCT CYST  2006   Dr. Wilburn Cornelia    MEDICATIONS: . albuterol (VENTOLIN HFA) 108 (90 Base) MCG/ACT inhaler  . amiodarone (PACERONE) 200 MG tablet  . atorvastatin (LIPITOR) 40 MG tablet  . b complex vitamins capsule  . finasteride (PROSCAR) 5 MG tablet  . folic acid (FOLVITE) 1 MG tablet  . folic acid (FOLVITE) 518 MCG tablet  . lisinopril (ZESTRIL) 10 MG tablet  . metoprolol succinate (TOPROL-XL) 50 MG 24 hr tablet  . Multiple Vitamin (MULTIVITAMIN WITH MINERALS) TABS tablet  . Tiotropium Bromide-Olodaterol (STIOLTO RESPIMAT) 2.5-2.5 MCG/ACT AERS  . XARELTO 20 MG TABS tablet   No current facility-administered medications for this encounter.     Konrad Felix, PA-C [facility] [phone]

## 2020-05-11 ENCOUNTER — Other Ambulatory Visit (HOSPITAL_COMMUNITY)
Admission: RE | Admit: 2020-05-11 | Discharge: 2020-05-11 | Disposition: A | Discharge status: Home / Self Care | Payer: Medicare HMO | Source: Ambulatory Visit | Attending: Urology | Admitting: Urology

## 2020-05-11 DIAGNOSIS — Z20822 Contact with and (suspected) exposure to covid-19: Secondary | ICD-10-CM | POA: Diagnosis not present

## 2020-05-11 DIAGNOSIS — Z01812 Encounter for preprocedural laboratory examination: Secondary | ICD-10-CM | POA: Diagnosis not present

## 2020-05-11 LAB — SARS CORONAVIRUS 2 (TAT 6-24 HRS): SARS Coronavirus 2: NEGATIVE

## 2020-05-13 NOTE — H&P (Signed)
I have prostate cancer.  HPI: [name] is a 70 year-old male established patient who is here evaluation for treatment of prostate cancer.    04/30/20: Zalmen returns today in f/u for his history of BPH with BOO and very low risk prostate cancer diagnosed on 09/15/19 for an elevated PSA of 4.39. His PSA has continued to fall with the finasteride and is down to 2.29. He has continues to have stable moderate/severe LUTS with an IPSS of 24.   02/06/20: Izzy returns today in f/u for his history of very low risk prostate cancer found a biopsy on 09/15/19. His PSA has fallen to 4.09 from 4.39 prior to the biopsy.   On the biopsy he was found to have a 35ml prostate with a single core of Gleason 6(3+3) 14% in the right medial apex. He had 3 additional cores of atypia. He is here today for voiding studies and possible cystoscopy for his moderate/severe LUTS.   He has T1c Nx Mx very low risk disease.   He has ED with a SHIM of 2.   He has an IPSS of 21 and has failed multiple meds.   He has multiple comorbitiies including CAD with Afib and prior TIA for which he on Xarelto and Amiodaroine, COPD and lung nodules that are being followed for progression.     AUA Symptom Score: More than 50% of the time he has the sensation of not emptying his bladder completely when finished urinating. 50% of the time he has to urinate again fewer than two hours after he has finished urinating. More than 50% of the time he has to start and stop again several times when he urinates. 50% of the time he finds it difficult to postpone urination. More than 50% of the time he has a weak urinary stream. 50% of the time he has to push or strain to begin urination. He has to get up to urinate 3 times from the time he goes to bed until the time he gets up in the morning.   Calculated AUA Symptom Score: 24    ALLERGIES: No Allergies    MEDICATIONS: Finasteride 5 mg tablet 1 tablet PO Daily  Metoprolol Succinate 50 mg tablet,  extended release 24 hr  Amiodarone Hcl 100 mg tablet  Atorvastatin Calcium 40 MG Oral Tablet Oral  Folic Acid 1 mg tablet  Lisinopril 10 MG Oral Tablet Oral  Multivitamin  Vitamin B6 1 PO Daily  Xarelto 20 mg tablet     Notes: He is on a new inhaler for COPD, possible atrovent or albuterol.   GU PSH: Complex Uroflow - 02/06/2020 Cystoscopy - 02/06/2020 Prostate Needle Biopsy - 09/15/2019       PSH Notes: Cardiac Catheter His Ablation, Inguinal Hernia Repair, Inguinal Hernia Repair, Thyroid Surgery   NON-GU PSH: Surgical Pathology, Gross And Microscopic Examination For Prostate Needle - 09/15/2019     GU PMH: BPH w/LUTS, He has BPH with BOO with a 67ml prostate [place] but a short prostatic urethra without bulky hyperplasia. I discussed options including Urolift, TURP/TUIP, PTNS and finasteride. I didn't discuss Rezum because with his COPD, Nitrous is problematic. At this time I will give him a trial of finasteride and reviewed the side effects. F/u in 3 months. - 02/06/2020, IPSS is 22. , - 11/18/2017, Benign prostatic hyperplasia with urinary obstruction, - 2014 Elevated PSA - 02/06/2020, His PSA has been rising over the last 9 years and was up to 3.79 in May but his exam  is benign. I will repeat in 1 month after the antibiotics. , - 11/18/2017 Nocturia - 02/06/2020, Nocturia, - 2014 Prostate Cancer, His PSA has fallen to 4.09 and I will have him return in 3 months with a repeat on finasteride. - 02/06/2020, He has T1c Nx Mx Gleason 6 very low risk prostate cancer with severe ED and moderate to severe LUTS. He has several serious comorbities. I reviewed the Predict Nomogram which suggests a 2% probability of prostate cancer mortality with conservative therapy at 10 years and 1% with radical therapy. I discussed the options for management and associated risks including Active Surveillance, RALP, EXRT, Seeds, Cryo and HIFU. I feel he would be a good candidate for an initial course of active surveillance  and he is in agreement. I will have him return in 4 months with a PSA. , - 10/05/2019 Weak Urinary Stream, PF is low on flowmetry today. - 02/06/2020, - 07/11/2019 ED due to arterial insufficiency, He has severe ED. - 10/05/2019 Proteinuria, He has a normal Cr but if the proteinuria persists at f/u he will need further evaluation. - 11/18/2017 Polyuria, Polyuria - 2014      PMH Notes:  1898-03-17 00:00:00 - Note: Normal Routine History And Physical Adult  2012-07-22 15:31:51 - Note: Sinus Arrhythmia   NON-GU PMH: Bacteriuria, Urine culture today. - 11/18/2017 Personal history of other diseases of the circulatory system, History of cardiac disorder - 2014, History of essential hypertension, - 2014 Personal history of other endocrine, nutritional and metabolic disease, History of hypercholesterolemia - 2014 Unspecified atherosclerosis, Atherosclerosis - 2014 Unspecified atrial fibrillation, Atrial Fibrillation - 2014 Hypercholesterolemia Hypertension Transient ischemic attack    FAMILY HISTORY: 2 sons - No Family History adopted - Runs In Family Family Health Status Number - Runs In Family    Notes: PT was adopted no family history    SOCIAL HISTORY: Marital Status: Married Preferred Language: English; Race: White Current Smoking Status: Patient smokes. Smokes 1/2 pack per day.   Tobacco Use Assessment Completed: Used Tobacco in last 30 days? Drinks 3 drinks per day. Types of alcohol consumed: Beer.  Drinks 2 caffeinated drinks per day.     Notes: Tobacco use, Caffeine Use, Alcohol Use, Occupation:, Marital History - Currently Married   REVIEW OF SYSTEMS:    GU Review Male:   Patient denies frequent urination, hard to postpone urination, burning/ pain with urination, get up at night to urinate, leakage of urine, stream starts and stops, trouble starting your stream, have to strain to urinate , erection problems, and penile pain.  Gastrointestinal (Upper):   Patient denies nausea,  vomiting, and indigestion/ heartburn.  Gastrointestinal (Lower):   Patient denies constipation and diarrhea.  Constitutional:   Patient denies fever, night sweats, weight loss, and fatigue.  Skin:   Patient denies skin rash/ lesion and itching.  Eyes:   Patient denies blurred vision and double vision.  Ears/ Nose/ Throat:   Patient denies sore throat and sinus problems.  Hematologic/Lymphatic:   Patient denies swollen glands and easy bruising.  Cardiovascular:   Patient denies leg swelling and chest pains.  Respiratory:   Patient denies cough and shortness of breath.  Endocrine:   Patient denies excessive thirst.  Musculoskeletal:   Patient denies back pain and joint pain.  Neurological:   Patient denies headaches and dizziness.  Psychologic:   Patient denies depression and anxiety.   Notes: no issues    VITAL SIGNS:      04/30/2020 08:32 AM  Weight 165  lb / 74.84 kg  Height 74 in / 187.96 cm  BP 129/73 mmHg  Heart Rate 62 /min  Temperature 97.0 F / 36.1 C  BMI 21.2 kg/m   MULTI-SYSTEM PHYSICAL EXAMINATION:    Constitutional: Well-nourished. No physical deformities. Normally developed. Good grooming.  Respiratory: Normal breath sounds. No labored breathing, no use of accessory muscles.   Cardiovascular: Regular rate and rhythm. No murmur, no gallop.      Complexity of Data:  Lab Test Review:   PSA  Records Review:   AUA Symptom Score, Previous Patient Records  Urine Test Review:   Urinalysis   04/24/20 01/30/20 07/04/19 03/02/18 12/17/17 07/27/17 08/20/13 06/21/12  PSA  Total PSA 2.29 ng/mL 4.09 ng/mL 4.39 ng/mL 2.53 ng/mL 3.54 ng/mL 3.79 ng/dl 2.12 ng/dl 1.9 ng/dl  Free PSA  0.52 ng/mL 0.54 ng/mL       % Free PSA  13 % PSA 12 % PSA         PROCEDURES:          Urinalysis - 81003 Dipstick Dipstick Cont'd  Color: Yellow Bilirubin: Neg  Appearance: Clear Ketones: Neg  Specific Gravity: 1.025 Blood: Neg  pH: 5.5 Protein: Neg  Glucose: Neg Urobilinogen: 0.2     Nitrites: Neg    Leukocyte Esterase: Neg    Notes:      ASSESSMENT:      ICD-10 Details  1 GU:   Prostate Cancer - C61 Chronic, Stable - His PSA is falling appropriately on finasteride and I will continue to follow the PSA's for now.   2   Elevated PSA - R97.20 Chronic, Improving - Down as expected.   3   BPH w/LUTS - N40.1 Chronic, Stable - He has persistent mod/severe LUTS despite the finasteride and he has failed several prior meds. I discussed options and will get him set up for a TURP. I reviewd the risks of a TURP including bleeding, infection, incontinence, stricture, need for secondary procedures, ejaculatory and erectile dysfunction, thrombotic events, fluid overload and anesthetic complications. I explained that 95% of men will have relief of the obstructive symptoms and about 70% will have relief of the irritative symptoms. He will need clearance to come off of the xarelto.   4   Nocturia - R35.1 Chronic, Stable  5   Weak Urinary Stream - R39.12 Chronic, Stable   PLAN:            Medications Refill Meds: Finasteride 5 mg tablet 1 tablet PO Daily   #90  3 Refill(s)            Schedule Return Visit/Planned Activity: Next Available Appointment - Schedule Surgery  Procedure: Unspecified Date - Cystoscopy TURP - 70263 Notes: Next available.

## 2020-05-14 NOTE — Anesthesia Preprocedure Evaluation (Addendum)
Anesthesia Evaluation  Patient identified by MRN, date of birth, ID band Patient awake    Reviewed: Allergy & Precautions, NPO status , Patient's Chart, lab work & pertinent test results  History of Anesthesia Complications Negative for: history of anesthetic complications  Airway Mallampati: II  TM Distance: >3 FB Neck ROM: Full    Dental no notable dental hx. (+) Dental Advisory Given   Pulmonary COPD, Current Smoker,    Pulmonary exam normal        Cardiovascular hypertension, Pt. on medications and Pt. on home beta blockers Normal cardiovascular exam+ dysrhythmias Atrial Fibrillation   IMPRESSIONS   1. Left ventricular ejection fraction, by estimation, is 60 to 65%. The left ventricle has normal function. The left ventricle has no regional wall motion abnormalities. Left ventricular diastolic parameters were normal. 2. Right ventricular systolic function is normal. The right ventricular size is normal. 3. The mitral valve is grossly normal. Trivial mitral valve regurgitation. 4. The aortic valve is normal in structure. Aortic valve regurgitation is not visualized. No aortic stenosis is present.    Neuro/Psych CVA    GI/Hepatic Neg liver ROS, GERD  ,  Endo/Other  negative endocrine ROS  Renal/GU negative Renal ROS     Musculoskeletal negative musculoskeletal ROS (+)   Abdominal   Peds  Hematology negative hematology ROS (+)   Anesthesia Other Findings   Reproductive/Obstetrics                           Anesthesia Physical Anesthesia Plan  ASA: III  Anesthesia Plan: General   Post-op Pain Management:    Induction: Intravenous  PONV Risk Score and Plan: 2 and Ondansetron and Dexamethasone  Airway Management Planned: LMA  Additional Equipment:   Intra-op Plan:   Post-operative Plan: Extubation in OR  Informed Consent: I have reviewed the patients History and  Physical, chart, labs and discussed the procedure including the risks, benefits and alternatives for the proposed anesthesia with the patient or authorized representative who has indicated his/her understanding and acceptance.     Dental advisory given  Plan Discussed with: Anesthesiologist and CRNA  Anesthesia Plan Comments:        Anesthesia Quick Evaluation

## 2020-05-15 ENCOUNTER — Other Ambulatory Visit: Payer: Self-pay

## 2020-05-15 ENCOUNTER — Encounter (HOSPITAL_COMMUNITY): Payer: Self-pay | Admitting: Urology

## 2020-05-15 ENCOUNTER — Encounter (HOSPITAL_COMMUNITY)
Admission: RE | Disposition: A | Discharge status: Home / Self Care | Payer: Self-pay | Source: Home / Self Care | Attending: Urology

## 2020-05-15 ENCOUNTER — Observation Stay (HOSPITAL_COMMUNITY)
Admission: RE | Admit: 2020-05-15 | Discharge: 2020-05-16 | Disposition: A | Discharge status: Home / Self Care | Payer: Medicare HMO | Attending: Urology | Admitting: Urology

## 2020-05-15 ENCOUNTER — Ambulatory Visit (HOSPITAL_COMMUNITY): Payer: Medicare HMO | Admitting: Certified Registered Nurse Anesthetist

## 2020-05-15 ENCOUNTER — Ambulatory Visit (HOSPITAL_COMMUNITY): Payer: Medicare HMO | Admitting: Physician Assistant

## 2020-05-15 DIAGNOSIS — I252 Old myocardial infarction: Secondary | ICD-10-CM | POA: Insufficient documentation

## 2020-05-15 DIAGNOSIS — Z79899 Other long term (current) drug therapy: Secondary | ICD-10-CM | POA: Insufficient documentation

## 2020-05-15 DIAGNOSIS — C61 Malignant neoplasm of prostate: Secondary | ICD-10-CM | POA: Diagnosis not present

## 2020-05-15 DIAGNOSIS — Z7901 Long term (current) use of anticoagulants: Secondary | ICD-10-CM | POA: Insufficient documentation

## 2020-05-15 DIAGNOSIS — R351 Nocturia: Secondary | ICD-10-CM | POA: Diagnosis not present

## 2020-05-15 DIAGNOSIS — I110 Hypertensive heart disease with heart failure: Secondary | ICD-10-CM | POA: Diagnosis not present

## 2020-05-15 DIAGNOSIS — I2510 Atherosclerotic heart disease of native coronary artery without angina pectoris: Secondary | ICD-10-CM | POA: Insufficient documentation

## 2020-05-15 DIAGNOSIS — N401 Enlarged prostate with lower urinary tract symptoms: Principal | ICD-10-CM | POA: Insufficient documentation

## 2020-05-15 DIAGNOSIS — I5033 Acute on chronic diastolic (congestive) heart failure: Secondary | ICD-10-CM | POA: Insufficient documentation

## 2020-05-15 DIAGNOSIS — N138 Other obstructive and reflux uropathy: Secondary | ICD-10-CM | POA: Diagnosis not present

## 2020-05-15 DIAGNOSIS — N320 Bladder-neck obstruction: Secondary | ICD-10-CM | POA: Diagnosis not present

## 2020-05-15 DIAGNOSIS — R972 Elevated prostate specific antigen [PSA]: Secondary | ICD-10-CM | POA: Diagnosis not present

## 2020-05-15 DIAGNOSIS — R3912 Poor urinary stream: Secondary | ICD-10-CM | POA: Diagnosis not present

## 2020-05-15 DIAGNOSIS — Z8673 Personal history of transient ischemic attack (TIA), and cerebral infarction without residual deficits: Secondary | ICD-10-CM | POA: Diagnosis not present

## 2020-05-15 DIAGNOSIS — I5022 Chronic systolic (congestive) heart failure: Secondary | ICD-10-CM | POA: Diagnosis not present

## 2020-05-15 DIAGNOSIS — J449 Chronic obstructive pulmonary disease, unspecified: Secondary | ICD-10-CM | POA: Diagnosis not present

## 2020-05-15 HISTORY — PX: TRANSURETHRAL RESECTION OF PROSTATE: SHX73

## 2020-05-15 SURGERY — TURP (TRANSURETHRAL RESECTION OF PROSTATE)
Anesthesia: General

## 2020-05-15 MED ORDER — CHLORHEXIDINE GLUCONATE 0.12 % MT SOLN
15.0000 mL | Freq: Once | OROMUCOSAL | Status: AC
  Administered 2020-05-15: 15 mL via OROMUCOSAL

## 2020-05-15 MED ORDER — DEXAMETHASONE SODIUM PHOSPHATE 10 MG/ML IJ SOLN
INTRAMUSCULAR | Status: DC | PRN
  Administered 2020-05-15: 8 mg via INTRAVENOUS

## 2020-05-15 MED ORDER — ONDANSETRON HCL 4 MG/2ML IJ SOLN
INTRAMUSCULAR | Status: AC
  Filled 2020-05-15: qty 2

## 2020-05-15 MED ORDER — ALBUMIN HUMAN 5 % IV SOLN
INTRAVENOUS | Status: AC
  Filled 2020-05-15: qty 250

## 2020-05-15 MED ORDER — AMIODARONE HCL 100 MG PO TABS
100.0000 mg | ORAL_TABLET | Freq: Every day | ORAL | Status: DC
  Administered 2020-05-16: 100 mg via ORAL
  Filled 2020-05-15: qty 1

## 2020-05-15 MED ORDER — BISACODYL 10 MG RE SUPP: 10.0000 mg | Freq: Every day | RECTAL | Status: DC | PRN

## 2020-05-15 MED ORDER — FLEET ENEMA 7-19 GM/118ML RE ENEM: 1.0000 | ENEMA | Freq: Once | RECTAL | Status: DC | PRN

## 2020-05-15 MED ORDER — DEXAMETHASONE SODIUM PHOSPHATE 10 MG/ML IJ SOLN
INTRAMUSCULAR | Status: AC
  Filled 2020-05-15: qty 1

## 2020-05-15 MED ORDER — LIDOCAINE HCL (PF) 2 % IJ SOLN
INTRAMUSCULAR | Status: AC
  Filled 2020-05-15: qty 10

## 2020-05-15 MED ORDER — POTASSIUM CHLORIDE IN NACL 20-0.45 MEQ/L-% IV SOLN
INTRAVENOUS | Status: DC
  Filled 2020-05-15 (×3): qty 1000

## 2020-05-15 MED ORDER — EPHEDRINE 5 MG/ML INJ
INTRAVENOUS | Status: AC
  Filled 2020-05-15: qty 20

## 2020-05-15 MED ORDER — LIDOCAINE 2% (20 MG/ML) 5 ML SYRINGE
INTRAMUSCULAR | Status: DC | PRN
  Administered 2020-05-15: 100 mg via INTRAVENOUS

## 2020-05-15 MED ORDER — AMISULPRIDE (ANTIEMETIC) 5 MG/2ML IV SOLN: 10.0000 mg | Freq: Once | INTRAVENOUS | Status: DC | PRN

## 2020-05-15 MED ORDER — CEPHALEXIN 500 MG PO CAPS
500.0000 mg | ORAL_CAPSULE | Freq: Three times a day (TID) | ORAL | Status: DC
  Administered 2020-05-15 – 2020-05-16 (×3): 500 mg via ORAL
  Filled 2020-05-15 (×3): qty 1

## 2020-05-15 MED ORDER — FINASTERIDE 5 MG PO TABS
5.0000 mg | ORAL_TABLET | Freq: Every day | ORAL | Status: DC
  Administered 2020-05-16: 5 mg via ORAL
  Filled 2020-05-15: qty 1

## 2020-05-15 MED ORDER — ORAL CARE MOUTH RINSE: 15.0000 mL | Freq: Once | OROMUCOSAL | Status: AC

## 2020-05-15 MED ORDER — MIDAZOLAM HCL 5 MG/5ML IJ SOLN
INTRAMUSCULAR | Status: DC | PRN
  Administered 2020-05-15 (×2): 1 mg via INTRAVENOUS

## 2020-05-15 MED ORDER — ZOLPIDEM TARTRATE 5 MG PO TABS: 5.0000 mg | ORAL_TABLET | Freq: Every evening | ORAL | Status: DC | PRN

## 2020-05-15 MED ORDER — FENTANYL CITRATE (PF) 100 MCG/2ML IJ SOLN
25.0000 ug | INTRAMUSCULAR | Status: DC | PRN
Start: 2020-05-15 — End: 2020-05-15

## 2020-05-15 MED ORDER — FENTANYL CITRATE (PF) 100 MCG/2ML IJ SOLN
INTRAMUSCULAR | Status: AC
  Filled 2020-05-15: qty 2

## 2020-05-15 MED ORDER — CELECOXIB 200 MG PO CAPS
200.0000 mg | ORAL_CAPSULE | Freq: Once | ORAL | Status: AC
  Administered 2020-05-15: 200 mg via ORAL
  Filled 2020-05-15: qty 1

## 2020-05-15 MED ORDER — PROPOFOL 10 MG/ML IV BOLUS
INTRAVENOUS | Status: AC
  Filled 2020-05-15: qty 20

## 2020-05-15 MED ORDER — SODIUM CHLORIDE 0.9 % IR SOLN: 3000.0000 mL | Status: DC

## 2020-05-15 MED ORDER — CEFAZOLIN SODIUM-DEXTROSE 2-4 GM/100ML-% IV SOLN
2.0000 g | INTRAVENOUS | Status: AC
  Administered 2020-05-15: 2 g via INTRAVENOUS
  Filled 2020-05-15: qty 100

## 2020-05-15 MED ORDER — METOPROLOL SUCCINATE ER 50 MG PO TB24
50.0000 mg | ORAL_TABLET | Freq: Every day | ORAL | Status: DC
  Administered 2020-05-16: 50 mg via ORAL
  Filled 2020-05-15: qty 1

## 2020-05-15 MED ORDER — ACETAMINOPHEN 500 MG PO TABS
1000.0000 mg | ORAL_TABLET | Freq: Once | ORAL | Status: AC
  Administered 2020-05-15: 1000 mg via ORAL
  Filled 2020-05-15: qty 2

## 2020-05-15 MED ORDER — MIDAZOLAM HCL 2 MG/2ML IJ SOLN
INTRAMUSCULAR | Status: AC
  Filled 2020-05-15: qty 2

## 2020-05-15 MED ORDER — SODIUM CHLORIDE 0.9 % IR SOLN
Status: DC | PRN
  Administered 2020-05-15: 6000 mL
  Administered 2020-05-15: 4000 mL

## 2020-05-15 MED ORDER — LACTATED RINGERS IV SOLN: INTRAVENOUS | Status: DC

## 2020-05-15 MED ORDER — FENTANYL CITRATE (PF) 100 MCG/2ML IJ SOLN
INTRAMUSCULAR | Status: DC | PRN
  Administered 2020-05-15: 50 ug via INTRAVENOUS
  Administered 2020-05-15 (×3): 25 ug via INTRAVENOUS

## 2020-05-15 MED ORDER — PROPOFOL 10 MG/ML IV BOLUS
INTRAVENOUS | Status: DC | PRN
  Administered 2020-05-15: 160 mg via INTRAVENOUS

## 2020-05-15 MED ORDER — OXYCODONE HCL 5 MG PO TABS: 5.0000 mg | ORAL_TABLET | ORAL | Status: DC | PRN

## 2020-05-15 MED ORDER — SENNOSIDES-DOCUSATE SODIUM 8.6-50 MG PO TABS: 1.0000 | ORAL_TABLET | Freq: Every evening | ORAL | Status: DC | PRN

## 2020-05-15 MED ORDER — HYDROMORPHONE HCL 1 MG/ML IJ SOLN
0.5000 mg | INTRAMUSCULAR | Status: DC | PRN
Start: 2020-05-15 — End: 2020-05-16

## 2020-05-15 MED ORDER — ONDANSETRON HCL 4 MG/2ML IJ SOLN
INTRAMUSCULAR | Status: DC | PRN
  Administered 2020-05-15: 4 mg via INTRAVENOUS

## 2020-05-15 MED ORDER — HYOSCYAMINE SULFATE 0.125 MG SL SUBL
0.1250 mg | SUBLINGUAL_TABLET | SUBLINGUAL | Status: DC | PRN
  Filled 2020-05-15: qty 1

## 2020-05-15 MED ORDER — ONDANSETRON HCL 4 MG/2ML IJ SOLN: 4.0000 mg | INTRAMUSCULAR | Status: DC | PRN

## 2020-05-15 MED ORDER — ALBUTEROL SULFATE HFA 108 (90 BASE) MCG/ACT IN AERS: 2.0000 | INHALATION_SPRAY | RESPIRATORY_TRACT | Status: DC | PRN

## 2020-05-15 MED ORDER — ATORVASTATIN CALCIUM 40 MG PO TABS
40.0000 mg | ORAL_TABLET | Freq: Every day | ORAL | Status: DC
Start: 2020-05-16 — End: 2020-05-16
  Administered 2020-05-16: 40 mg via ORAL
  Filled 2020-05-15: qty 1

## 2020-05-15 MED ORDER — PHENYLEPHRINE 40 MCG/ML (10ML) SYRINGE FOR IV PUSH (FOR BLOOD PRESSURE SUPPORT)
PREFILLED_SYRINGE | INTRAVENOUS | Status: DC | PRN
  Administered 2020-05-15: 80 ug via INTRAVENOUS
  Administered 2020-05-15: 120 ug via INTRAVENOUS

## 2020-05-15 MED ORDER — LISINOPRIL 10 MG PO TABS
10.0000 mg | ORAL_TABLET | Freq: Every day | ORAL | Status: DC
Start: 2020-05-16 — End: 2020-05-16
  Administered 2020-05-16: 10 mg via ORAL
  Filled 2020-05-15: qty 1

## 2020-05-15 MED ORDER — ACETAMINOPHEN 325 MG PO TABS: 650.0000 mg | ORAL_TABLET | ORAL | Status: DC | PRN

## 2020-05-15 MED ORDER — EPHEDRINE SULFATE-NACL 50-0.9 MG/10ML-% IV SOSY
PREFILLED_SYRINGE | INTRAVENOUS | Status: DC | PRN
  Administered 2020-05-15 (×2): 10 mg via INTRAVENOUS
  Administered 2020-05-15: 5 mg via INTRAVENOUS

## 2020-05-15 MED ORDER — PHENYLEPHRINE HCL (PRESSORS) 10 MG/ML IV SOLN
INTRAVENOUS | Status: AC
  Filled 2020-05-15: qty 2

## 2020-05-15 SURGICAL SUPPLY — 17 items
BAG DRN RND TRDRP ANRFLXCHMBR (UROLOGICAL SUPPLIES)
BAG URINE DRAIN 2000ML AR STRL (UROLOGICAL SUPPLIES) IMPLANT
BAG URO CATCHER STRL LF (MISCELLANEOUS) ×2 IMPLANT
CATH FOLEY 3WAY 30CC 22FR (CATHETERS) ×1 IMPLANT
ELECT REM PT RETURN 15FT ADLT (MISCELLANEOUS) ×2 IMPLANT
GLOVE SURG POLYISO LF SZ8 (GLOVE) IMPLANT
GOWN STRL REUS W/TWL XL LVL3 (GOWN DISPOSABLE) ×2 IMPLANT
HOLDER FOLEY CATH W/STRAP (MISCELLANEOUS) IMPLANT
KIT TURNOVER KIT A (KITS) ×2 IMPLANT
LOOP CUT BIPOLAR 24F LRG (ELECTROSURGICAL) IMPLANT
MANIFOLD NEPTUNE II (INSTRUMENTS) ×2 IMPLANT
PACK CYSTO (CUSTOM PROCEDURE TRAY) ×2 IMPLANT
SYR 30ML LL (SYRINGE) IMPLANT
SYR TOOMEY IRRIG 70ML (MISCELLANEOUS)
SYRINGE TOOMEY IRRIG 70ML (MISCELLANEOUS) IMPLANT
TUBING CONNECTING 10 (TUBING) ×2 IMPLANT
TUBING UROLOGY SET (TUBING) ×2 IMPLANT

## 2020-05-15 NOTE — Anesthesia Procedure Notes (Signed)
Procedure Name: LMA Insertion Date/Time: 05/15/2020 9:03 AM Performed by: West Pugh, CRNA Pre-anesthesia Checklist: Patient identified, Emergency Drugs available, Suction available, Patient being monitored and Timeout performed Patient Re-evaluated:Patient Re-evaluated prior to induction Oxygen Delivery Method: Circle system utilized Preoxygenation: Pre-oxygenation with 100% oxygen Induction Type: IV induction LMA: LMA with gastric port inserted LMA Size: 5.0 Number of attempts: 1 Placement Confirmation: positive ETCO2 and breath sounds checked- equal and bilateral Tube secured with: Tape Dental Injury: Teeth and Oropharynx as per pre-operative assessment

## 2020-05-15 NOTE — Op Note (Signed)
Preoperative diagnosis: 1. Bladder outlet obstruction secondary to BPH  Postoperative diagnosis:  1. Bladder outlet obstruction secondary to BPH  Procedure:  1. Cystoscopy 2. Transurethral Resection of the prostate  Surgeon: Irine Seal. M.D.  Anesthesia: general  Complications: None  EBL: 58ml  Specimens: 1. Prostate chips  Disposition of specimens: Pathology.  Indication: [name] is a patient with bladder outlet obstruction secondary to benign prostatic hyperplasia. After reviewing the management options for treatment, he elected to proceed with the above surgical procedure(s). We have discussed the potential benefits and risks of the procedure, side effects of the proposed treatment, the likelihood of the patient achieving the goals of the procedure, and any potential problems that might occur during the procedure or recuperation. Informed consent has been obtained.  Description of procedure:  The patient was taken to the operating room and general anesthesia was induced.  The patient was placed in the dorsal lithotomy position, prepped and draped in the usual sterile fashion, and preoperative antibiotics were administered. A preoperative time-out was performed.   Cystourethroscopy was performed.  The patient's urethra was examined and was normal.  The prostate is 3cm with bilobar hyperplasia and a high bladder neck with obstruction.   The bladder was then systematically examined in its entirety. There was mild trabeculation with no evidence of any bladder tumors, stones, or other mucosal pathology.  The ureteral orifices were identified and marked so as to be avoided during the procedure.  The prostate adenoma was then resected utilizing loop cautery resection with the bipolar cutting loop.  The prostate adenoma from the bladder neck back to the verumontanum was resected beginning at the six o'clock position and then extended to include the right and left lobes of the  prostate and anterior prostate. Care was taken not to resect distal to the verumontanum.  At the completion of the procedure the bladder was evacuated free of chips and hemostasis was insured.  Final inspection revealed intact ureteral orifices, a widely patent TUR channel and an intact external sphincter.   Hemostasis was then achieved with the cautery and the bladder was emptied and reinspected with no significant bleeding noted at the end of the procedure.    A 21fr 3 way catheter was then placed into the bladder and placed on continuous bladder irrigation.  The patient appeared to tolerate the procedure well and without complications.  The patient was able to be awakened and transferred to the recovery unit in satisfactory condition.

## 2020-05-15 NOTE — Interval H&P Note (Signed)
History and Physical Interval Note:  05/15/2020 8:46 AM  [name]  has presented today for surgery, with the diagnosis of [place].  The various methods of treatment have been discussed with the patient and family. After consideration of risks, benefits and other options for treatment, the patient has consented to  Procedure(s): TRANSURETHRAL RESECTION OF THE PROSTATE (TURP) (N/A) as a surgical intervention.  The patient's history has been reviewed, patient examined, no change in status, stable for surgery.  I have reviewed the patient's chart and labs.  Questions were answered to the patient's satisfaction.     Irine Seal

## 2020-05-15 NOTE — Discharge Instructions (Signed)
Transurethral Resection of the Prostate, Care After This sheet gives you information about how to care for yourself after your procedure. Your health care provider may also give you more specific instructions. If you have problems or questions, contact your health care provider. What can I expect after the procedure? After the procedure, it is common to have:  Mild pain in your lower abdomen.  Soreness or mild discomfort in your penis from having the catheter inserted during the procedure.  A feeling of urgency when you need to urinate.  A small amount of blood in your urine. You may notice some small blood clots in your urine. These are normal. Follow these instructions at home: Medicines  Take over-the-counter and prescription medicines only as told by your health care provider.  If you were prescribed an antibiotic medicine, take it as told by your health care provider. Do not stop taking the antibiotic even if you start to feel better.  Ask your health care provider if the medicine prescribed to you: ? Requires you to avoid driving or using heavy machinery. ? Can cause constipation. You may need to take actions to prevent or treat constipation, such as:  Take over-the-counter or prescription medicines.  Eat foods that are high in fiber, such as fresh fruits and vegetables, whole grains, and beans.  Limit foods that are high in fat and processed sugars, such as fried or sweet foods.  Do not drive for 24 hours if you were given a sedative during your procedure. Activity  Return to your normal activities as told by your health care provider. Ask your health care provider what activities are safe for you.  Do not lift anything that is heavier than 10 lb (4.5 kg), or the limit that you are told, for 3 weeks after the procedure or until your health care provider says that it is safe.  Avoid intense physical activity for as long as told by your health care provider.  Avoid sitting  for a long time without moving. Get up and move around one or more times every few hours. This helps to prevent blood clots. You may increase your physical activity gradually as you start to feel better.   Lifestyle  Do not drink alcohol for as long as told by your health care provider. This is especially important if you are taking prescription pain medicines.  Do not engage in sexual activity until your health care provider says that you can do this. General instructions  Do not take baths, swim, or use a hot tub until your health care provider approves.  Drink enough fluid to keep your urine pale yellow.  Urinate as soon as you feel the need to. Do not try to hold your urine for long periods of time.  If your health care provider approves, you may take a stool softener for 2-3 weeks to prevent you from straining to have a bowel movement.  Wear compression stockings as told by your health care provider. These stockings help to prevent blood clots and reduce swelling in your legs.  Keep all follow-up visits as told by your health care provider. This is important.   Contact a health care provider if you have:  Difficulty urinating.  A fever.  Pain that gets worse or does not improve with medicine.  Blood in your urine that does not go away after 1 week of resting and drinking more fluids.  Swelling in your penis or testicles. Get help right away if:  You  are unable to urinate.  You are having more blood clots in your urine instead of fewer.  You have: ? Large blood clots. ? A lot of blood in your urine. ? Pain in your back or lower abdomen. ? Pain or swelling in your legs. ? Chills and you are shaking. ? Difficulty breathing or shortness of breath. Summary  After the procedure, it is common to have a small amount of blood in your urine.  Avoid heavy lifting and intense physical activity for as long as told by your health care provider.  Urinate as soon as you feel the  need to. Do not try to hold your urine for long periods of time.  Keep all follow-up visits as told by your health care provider. This is important.  You may resume the Xarelto in 1 week if you are not having blood in the urine.   This information is not intended to replace advice given to you by your health care provider. Make sure you discuss any questions you have with your health care provider. Document Revised: 06/23/2018 Document Reviewed: 12/02/2017 Elsevier Patient Education  2021 [employer].

## 2020-05-15 NOTE — Transfer of Care (Signed)
Immediate Anesthesia Transfer of Care Note  Patient: [name]  Procedure(s) Performed: TRANSURETHRAL RESECTION OF THE PROSTATE (TURP) (N/A )  Patient Location: PACU  Anesthesia Type:General  Level of Consciousness: drowsy and responds to stimulation  Airway & Oxygen Therapy: Patient Spontanous Breathing and Patient connected to face mask oxygen  Post-op Assessment: Report given to RN and Post -op Vital signs reviewed and stable  Post vital signs: Reviewed and stable  Last Vitals:  Vitals Value Taken Time  BP 102/45 05/15/20 0951  Temp    Pulse 59 05/15/20 0953  Resp 17 05/15/20 0953  SpO2 100 % 05/15/20 0953  Vitals shown include unvalidated device data.  Last Pain:  Vitals:   05/15/20 0723  PainSc: 0-No pain         Complications: No complications documented.

## 2020-05-15 NOTE — Anesthesia Postprocedure Evaluation (Signed)
Anesthesia Post Note  Patient: [name]  Procedure(s) Performed: TRANSURETHRAL RESECTION OF THE PROSTATE (TURP) (N/A )     Patient location during evaluation: PACU Anesthesia Type: General Level of consciousness: sedated Pain management: pain level controlled Vital Signs Assessment: post-procedure vital signs reviewed and stable Respiratory status: spontaneous breathing and respiratory function stable Cardiovascular status: stable Postop Assessment: no apparent nausea or vomiting Anesthetic complications: no   No complications documented.  Last Vitals:  Vitals:   05/15/20 1030 05/15/20 1045  BP: (!) 125/52 120/67  Pulse: (!) 58 (!) 56  Resp: 16 17  Temp:    SpO2: 100% 100%    Last Pain:  Vitals:   05/15/20 1045  PainSc: 0-No pain                 Darci Lykins DANIEL

## 2020-05-16 ENCOUNTER — Telehealth: Payer: Self-pay | Admitting: Pharmacist

## 2020-05-16 ENCOUNTER — Encounter (HOSPITAL_COMMUNITY): Payer: Self-pay | Admitting: Urology

## 2020-05-16 DIAGNOSIS — N138 Other obstructive and reflux uropathy: Secondary | ICD-10-CM | POA: Diagnosis not present

## 2020-05-16 DIAGNOSIS — J449 Chronic obstructive pulmonary disease, unspecified: Secondary | ICD-10-CM | POA: Diagnosis not present

## 2020-05-16 DIAGNOSIS — I2510 Atherosclerotic heart disease of native coronary artery without angina pectoris: Secondary | ICD-10-CM | POA: Diagnosis not present

## 2020-05-16 DIAGNOSIS — I110 Hypertensive heart disease with heart failure: Secondary | ICD-10-CM | POA: Diagnosis not present

## 2020-05-16 DIAGNOSIS — Z8673 Personal history of transient ischemic attack (TIA), and cerebral infarction without residual deficits: Secondary | ICD-10-CM | POA: Diagnosis not present

## 2020-05-16 DIAGNOSIS — N401 Enlarged prostate with lower urinary tract symptoms: Secondary | ICD-10-CM | POA: Diagnosis not present

## 2020-05-16 DIAGNOSIS — C61 Malignant neoplasm of prostate: Secondary | ICD-10-CM | POA: Diagnosis not present

## 2020-05-16 DIAGNOSIS — I5033 Acute on chronic diastolic (congestive) heart failure: Secondary | ICD-10-CM | POA: Diagnosis not present

## 2020-05-16 DIAGNOSIS — I252 Old myocardial infarction: Secondary | ICD-10-CM | POA: Diagnosis not present

## 2020-05-16 LAB — SURGICAL PATHOLOGY

## 2020-05-16 LAB — HIV ANTIBODY (ROUTINE TESTING W REFLEX): HIV Screen 4th Generation wRfx: NONREACTIVE

## 2020-05-16 NOTE — Plan of Care (Signed)

## 2020-05-16 NOTE — Progress Notes (Signed)
Patient discharged home.  IV removed - WNL.  Reviewed AVS and medications.  Follow up already in place. Patient has no questions.  Assisted off unit via [place] by RN in NAD.

## 2020-05-16 NOTE — Discharge Summary (Signed)
Physician Discharge Summary  Patient ID: [name] MRN: [id number] DOB/AGE: 1950-10-12 70 y.o.  Admit date: 05/15/2020 Discharge date: 05/16/2020  Admission Diagnoses:  BPH with obstruction/lower urinary tract symptoms  Discharge Diagnoses:  Principal Problem:   BPH with obstruction/lower urinary tract symptoms   Past Medical History:  Diagnosis Date  . Adenomatous colon polyp   . Alcohol use   . Antiphospholipid antibody positive    ???In the past???but not proven according to patient  . Antiphospholipid antibody positive    ??? in the past, but not proven according to the patient.  . Atrial flutter ([place])    atrial flutter ablation,   Dr.Allred October 18, 2010  . BPH (benign prostatic hypertrophy)   . CAD (coronary artery disease)    Catheterization 2005 disease / catheterization July 06, 2010, chronic total occlusion distal RCA with collaterals from right and left side.  Medical therapy recommended, mild decreased LV function       . Carotid artery disease ([facility])    1-44% R. ICA, 31-54% LICA... Doppler.... January, 2010  /  Doppler... in January, 2011 no change  /  doppler1/27/2012...stable  0-08% RICA 67-61% LICA  . CHF (congestive heart failure) ([place])   . COPD (chronic obstructive pulmonary disease) ([place])   . CRAO (central retinal artery occlusion)   . Ejection fraction     EF 50-55%, echo, March, 2012... inferobasal and distal septal hypokinesis /      60%, echo, November, 2008  . GERD (gastroesophageal reflux disease)   . Hypercholesterolemia   . Hypertension   . Myocardial infarction ([place])   . Nonsustained ventricular tachycardia ([place])   . Palpitations    probable SVT, rate 170,, at home,, lasted one hour,, 2012  . Prostate cancer ([place])   . Raynaud's syndrome   . Stroke [facility])    mild stroke per pt   . TIA (transient ischemic attack)    Possible small vessel tia in the past.  . Tobacco abuse     Surgeries: Procedure(s): TRANSURETHRAL RESECTION OF THE PROSTATE  (TURP) on 05/15/2020   Consultants (if any):   Discharged Condition: Improved  Hospital Course: [name] is an 70 y.o. male who was admitted 05/15/2020 with a diagnosis of BPH with obstruction/lower urinary tract symptoms and went to the operating room on 05/15/2020 and underwent the above named procedures.  He did well and his urine was clear on POD#1.  The foley was removed and he was able to void without difficulty and was felt to be ready for D/C.     He was given perioperative antibiotics:  Anti-infectives (From admission, onward)   Start     Dose/Rate Route Frequency Ordered Stop   05/15/20 1600  cephALEXin (KEFLEX) capsule 500 mg        500 mg Oral 3 times daily 05/15/20 1336     05/15/20 0700  ceFAZolin (ANCEF) IVPB 2g/100 mL premix        2 g 200 mL/hr over 30 Minutes Intravenous 30 min pre-op 05/15/20 0657 05/15/20 0934    .  He was given sequential compression devices  for DVT prophylaxis.  He benefited maximally from the hospital stay and there were no complications.    Recent vital signs:  Vitals:   05/16/20 0205 05/16/20 0440  BP: (!) 126/58 113/69  Pulse: 85 85  Resp: 18 18  Temp: 98.1 F (36.7 C) 97.9 F (36.6 C)  SpO2: 95% 99%    Recent laboratory studies:  Lab  Results  Component Value Date   HGB 16.7 05/03/2020   HGB 15.8 03/26/2020   HGB 15.3 09/22/2019   Lab Results  Component Value Date   WBC 9.2 05/03/2020   PLT 268 05/03/2020   Lab Results  Component Value Date   INR 1.69 03/06/2016   Lab Results  Component Value Date   NA 138 05/03/2020   K 4.9 05/03/2020   CL 101 05/03/2020   CO2 29 05/03/2020   BUN 19 05/03/2020   CREATININE 1.09 05/03/2020   GLUCOSE 97 05/03/2020    Discharge Medications:   Allergies as of 05/16/2020      Reactions   Cialis [tadalafil] Other (See Comments)   History of stroke    Levitra [vardenafil] Other (See Comments)   History of stroke    Viagra [sildenafil Citrate] Other (See Comments)   History of  stroke       Medication List    TAKE these medications   albuterol 108 (90 Base) MCG/ACT inhaler Commonly known as: VENTOLIN HFA INHALE TWO PUFFS into THE lungs EVERY 4 HOURS AS NEEDED FOR SHORTNESS OF BREATH OR wheezing What changed: See the new instructions.   amiodarone 200 MG tablet Commonly known as: PACERONE TAKE 1/2 TABLET(100 MG) BY MOUTH DAILY What changed:   how much to take  how to take this  when to take this  additional instructions   atorvastatin 40 MG tablet Commonly known as: LIPITOR TAKE ONE TABLET BY MOUTH ONCE DAILY What changed: additional instructions   b complex vitamins capsule Take 1 capsule by mouth daily.   finasteride 5 MG tablet Commonly known as: PROSCAR Take 5 mg by mouth daily.   folic acid 280 MCG tablet Commonly known as: FOLVITE Take 400 mcg by mouth daily.   lisinopril 10 MG tablet Commonly known as: ZESTRIL TAKE 1 TABLET(10 MG) BY MOUTH DAILY What changed:   how much to take  how to take this  when to take this  additional instructions   metoprolol succinate 50 MG 24 hr tablet Commonly known as: TOPROL-XL TAKE ONE TABLET BY MOUTH ONCE DAILY WITH A MEAL OR immediately following What changed:   how much to take  how to take this  when to take this  additional instructions   multivitamin with minerals Tabs tablet Take 1 tablet by mouth daily.   Stiolto Respimat 2.5-2.5 MCG/ACT Aers Generic drug: Tiotropium Bromide-Olodaterol Inhale 2 puffs into the lungs daily.   Xarelto 20 MG Tabs tablet Generic drug: rivaroxaban TAKE ONE TABLET BY MOUTH ONCE DAILY What changed:   how much to take  how to take this  when to take this  additional instructions       Diagnostic Studies: No results found.  Disposition: Discharge disposition: 01-Home or [place], Larry Ryan, NP On 05/29/2020.   Specialty: Nurse Practitioner Why: [phone] Contact information: [address] [place] [address] [phone]                Signed: Irine Seal 05/16/2020, 7:46 AM

## 2020-05-16 NOTE — Progress Notes (Signed)
Chronic Care Management Pharmacy Assistant   Name: [name]  MRN: [id number] DOB: 12/30/1950  Reason for Encounter: Medication Review  PCP : Hoyt Koch, MD  Allergies:   Allergies  Allergen Reactions  . Cialis [Tadalafil] Other (See Comments)    History of stroke   . Levitra [Vardenafil] Other (See Comments)    History of stroke   . Viagra [Sildenafil Citrate] Other (See Comments)    History of stroke     Medications: Outpatient Encounter Medications as of 05/16/2020  Medication Sig Note  . albuterol (VENTOLIN HFA) 108 (90 Base) MCG/ACT inhaler INHALE TWO PUFFS into THE lungs EVERY 4 HOURS AS NEEDED FOR SHORTNESS OF BREATH OR wheezing (Patient taking differently: Inhale 2 puffs into the lungs every 4 (four) hours as needed for wheezing or shortness of breath.)   . amiodarone (PACERONE) 200 MG tablet TAKE 1/2 TABLET(100 MG) BY MOUTH DAILY (Patient taking differently: Take 100 mg by mouth daily.)   . atorvastatin (LIPITOR) 40 MG tablet TAKE ONE TABLET BY MOUTH ONCE DAILY (Patient taking differently: Take 40 mg by mouth daily. Pt takes in the pm)   . b complex vitamins capsule Take 1 capsule by mouth daily.   . finasteride (PROSCAR) 5 MG tablet Take 5 mg by mouth daily.   . folic acid (FOLVITE) 660 MCG tablet Take 400 mcg by mouth daily.   Marland Kitchen lisinopril (ZESTRIL) 10 MG tablet TAKE 1 TABLET(10 MG) BY MOUTH DAILY (Patient taking differently: Take 10 mg by mouth daily.)   . metoprolol succinate (TOPROL-XL) 50 MG 24 hr tablet TAKE ONE TABLET BY MOUTH ONCE DAILY WITH A MEAL OR immediately following (Patient taking differently: Take 50 mg by mouth daily.)   . Multiple Vitamin (MULTIVITAMIN WITH MINERALS) TABS tablet Take 1 tablet by mouth daily.   . Tiotropium Bromide-Olodaterol (STIOLTO RESPIMAT) 2.5-2.5 MCG/ACT AERS Inhale 2 puffs into the lungs daily. 05/15/2020: Not taking   . XARELTO 20 MG TABS tablet TAKE ONE TABLET BY MOUTH ONCE DAILY (Patient taking differently: Pt  takes in the pm)    Facility-Administered Encounter Medications as of 05/16/2020  Medication  . 0.45 % NaCl with KCl 20 mEq / L infusion  . acetaminophen (TYLENOL) tablet 650 mg  . albuterol (VENTOLIN HFA) 108 (90 Base) MCG/ACT inhaler 2 puff  . amiodarone (PACERONE) tablet 100 mg  . atorvastatin (LIPITOR) tablet 40 mg  . bisacodyl (DULCOLAX) suppository 10 mg  . cephALEXin (KEFLEX) capsule 500 mg  . finasteride (PROSCAR) tablet 5 mg  . HYDROmorphone (DILAUDID) injection 0.5-1 mg  . hyoscyamine (LEVSIN SL) SL tablet 0.125 mg  . lisinopril (ZESTRIL) tablet 10 mg  . metoprolol succinate (TOPROL-XL) 24 hr tablet 50 mg  . ondansetron (ZOFRAN) injection 4 mg  . oxyCODONE (Oxy IR/ROXICODONE) immediate release tablet 5 mg  . senna-docusate (Senokot-S) tablet 1 tablet  . sodium chloride irrigation 0.9 % 3,000 mL  . sodium phosphate (FLEET) 7-19 GM/118ML enema 1 enema  . zolpidem (AMBIEN) tablet 5 mg    Current Diagnosis: Patient Active Problem List   Diagnosis Date Noted  . BPH with obstruction/lower urinary tract symptoms 05/15/2020  . Chronic systolic heart failure ([place]) 09/22/2019  . Protein in urine 02/03/2018  . Centrilobular emphysema ([place]) 05/06/2016  . CHF (congestive heart failure) ([place]) 03/05/2016  . Routine general medical examination at a health care facility 02/28/2014  . Paroxysmal atrial fibrillation ([place]) 12/05/2013  . Nonsustained ventricular tachycardia ([place])   . 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  Atrial fibrillation with RVR ([place])   . CAD (coronary artery disease)   . Raynaud's syndrome   . CRAO (central retinal artery occlusion)   . Carotid artery disease ([place])   . TOBACCO ABUSE 04/12/2009  . BPH (benign prostatic hyperplasia) 12/19/2008  . THYROID CYST 07/05/2007  . Hyperlipidemia 07/05/2007  . Alcohol abuse 07/05/2007  . Essential hypertension 07/05/2007  . GERD 07/05/2007    Reviewed chart for medication changes ahead of medication  coordination call.  No OVs, Consults, or hospital visits since last care coordination call/Pharmacist visit. (If appropriate, list visit date, provider name)  No medication changes indicated OR if recent visit, treatment plan here.  BP Readings from Last 3 Encounters:  05/16/20 113/69  05/03/20 (!) 150/71  03/26/20 128/68    No results found for: HGBA1C   Patient obtains medications through Vials  30 Days   Last adherence delivery included:  Xarelto 20 mg tab Take one tab by mouth once daily  Patient declined (meds) last month due to PRN use/additional supply on hand. Explanation of abundance on hand (ie #30 due to overlapping fills or previous adherence issues etc)  Patient is due for next adherence delivery on: 05-17-20. Called patient and reviewed medications and coordinated delivery.  This delivery to include: Albuterol Aer Hfa Inhale two puffs by mouth into lungs every 4 hours as needed for shortness of breath or for wheezing Stiolto Aer 2.5-2.5 Inhale two puffs into the Lung daily as directed   Patient needs refills for : Stiolto Aer 2.5-2.5 Inhale two puffs into the Lung daily as directed- CPP to request   Confirmed delivery date of 05-17-20, advised patient that pharmacy will contact them the morning of delivery.  [name] ,[place] Pharmacist Assistant [phone]  Follow-Up:  Pharmacist Review    Time Spent : 28 minutes

## 2020-05-17 ENCOUNTER — Other Ambulatory Visit: Payer: Self-pay | Admitting: Internal Medicine

## 2020-05-17 MED ORDER — STIOLTO RESPIMAT 2.5-2.5 MCG/ACT IN AERS: 2.0000 | INHALATION_SPRAY | Freq: Every day | RESPIRATORY_TRACT | 0 refills | Status: DC

## 2020-05-17 NOTE — Telephone Encounter (Signed)
See below

## 2020-05-17 NOTE — Telephone Encounter (Signed)
Patient is requesting refill for Stiolto inhaler to [facility]

## 2020-05-17 NOTE — Telephone Encounter (Signed)
Sent in 1 refill but overdue for visit would need that for further.

## 2020-05-22 NOTE — Progress Notes (Signed)
A call was made to Mr. [name] and a message was left for the patient that a refill was sent into the pharmacy at [facility] but the patient will need to set up appointment to see Dr. Sharlet Salina before anymore refill are given.  Wendy Poet, [place] [phone]

## 2020-05-29 DIAGNOSIS — R310 Gross hematuria: Secondary | ICD-10-CM | POA: Diagnosis not present

## 2020-06-06 ENCOUNTER — Telehealth: Payer: Self-pay | Admitting: Pharmacist

## 2020-06-06 DIAGNOSIS — J410 Simple chronic bronchitis: Secondary | ICD-10-CM

## 2020-06-06 NOTE — Progress Notes (Signed)
    Chronic Care Management Pharmacy Assistant   Name: RALPH BROUWER  MRN: [id number] DOB: 1950-05-27   Reason for Encounter: Medication Coordination   Medications: Outpatient Encounter Medications as of 06/06/2020  Medication Sig  . albuterol (VENTOLIN HFA) 108 (90 Base) MCG/ACT inhaler INHALE TWO PUFFS into THE lungs EVERY 4 HOURS AS NEEDED FOR SHORTNESS OF BREATH OR wheezing (Patient taking differently: Inhale 2 puffs into the lungs every 4 (four) hours as needed for wheezing or shortness of breath.)  . amiodarone (PACERONE) 200 MG tablet TAKE 1/2 TABLET(100 MG) BY MOUTH DAILY (Patient taking differently: Take 100 mg by mouth daily.)  . atorvastatin (LIPITOR) 40 MG tablet TAKE ONE TABLET BY MOUTH ONCE DAILY (Patient taking differently: Take 40 mg by mouth daily. Pt takes in the pm)  . b complex vitamins capsule Take 1 capsule by mouth daily.  . finasteride (PROSCAR) 5 MG tablet Take 5 mg by mouth daily.  . folic acid (FOLVITE) 175 MCG tablet Take 400 mcg by mouth daily.  Marland Kitchen lisinopril (ZESTRIL) 10 MG tablet TAKE 1 TABLET(10 MG) BY MOUTH DAILY (Patient taking differently: Take 10 mg by mouth daily.)  . metoprolol succinate (TOPROL-XL) 50 MG 24 hr tablet TAKE ONE TABLET BY MOUTH ONCE DAILY WITH A MEAL OR immediately following (Patient taking differently: Take 50 mg by mouth daily.)  . Multiple Vitamin (MULTIVITAMIN WITH MINERALS) TABS tablet Take 1 tablet by mouth daily.  . Tiotropium Bromide-Olodaterol (STIOLTO RESPIMAT) 2.5-2.5 MCG/ACT AERS Inhale 2 puffs into the lungs daily.  Alveda Reasons 20 MG TABS tablet TAKE ONE TABLET BY MOUTH ONCE DAILY (Patient taking differently: Pt takes in the pm)   No facility-administered encounter medications on file as of 06/06/2020.    Reviewed chart for medication changes ahead of medication coordination call.  No OVs, Consults, or hospital visits since last care coordination call/Pharmacist visit. (If appropriate, list visit date, provider name)  No  medication changes indicated OR if recent visit, treatment plan here.  BP Readings from Last 3 Encounters:  05/16/20 113/69  05/03/20 (!) 150/71  03/26/20 128/68    No results found for: HGBA1C   Patient obtains medications through Vials  90 Days   Last adherence delivery included: Stiolto Aer 2.5-2.5 Inhale 2 puffs into the lungs daily Albuterol Inhale two puffs by mouth into lungs every 4 hours as needed for shortness of breath or for wheezing  Called patient and reviewed medications. Patient is not needing any medications at this time .  Medications declined : Xarelto 20 mg Take one tablet by mouth once daily- Patient stated he was in the ED and had to stop taking them for a while. Patient stated he still has a month supply on hand .   Informed patient that he needs to be seen by PCP Dr. Sharlet Salina before we can fill his Stiolto Aer Inhaler again.     [name] ,[place] Pharmacist Assistant [phone]        Time spent : 42minutes

## 2020-06-21 ENCOUNTER — Other Ambulatory Visit: Payer: Self-pay

## 2020-06-21 ENCOUNTER — Ambulatory Visit (INDEPENDENT_AMBULATORY_CARE_PROVIDER_SITE_OTHER): Payer: Medicare HMO | Admitting: Internal Medicine

## 2020-06-21 ENCOUNTER — Encounter: Payer: Self-pay | Admitting: Internal Medicine

## 2020-06-21 VITALS — BP 122/64 | HR 72 | Temp 97.8°F | Resp 18 | Ht 74.0 in | Wt 166.8 lb

## 2020-06-21 DIAGNOSIS — Z0000 Encounter for general adult medical examination without abnormal findings: Secondary | ICD-10-CM

## 2020-06-21 DIAGNOSIS — I480 Paroxysmal atrial fibrillation: Secondary | ICD-10-CM | POA: Diagnosis not present

## 2020-06-21 DIAGNOSIS — E782 Mixed hyperlipidemia: Secondary | ICD-10-CM

## 2020-06-21 DIAGNOSIS — I10 Essential (primary) hypertension: Secondary | ICD-10-CM | POA: Diagnosis not present

## 2020-06-21 DIAGNOSIS — J432 Centrilobular emphysema: Secondary | ICD-10-CM

## 2020-06-21 DIAGNOSIS — J410 Simple chronic bronchitis: Secondary | ICD-10-CM | POA: Diagnosis not present

## 2020-06-21 DIAGNOSIS — Z1211 Encounter for screening for malignant neoplasm of colon: Secondary | ICD-10-CM

## 2020-06-21 MED ORDER — STIOLTO RESPIMAT 2.5-2.5 MCG/ACT IN AERS: 2.0000 | INHALATION_SPRAY | Freq: Every day | RESPIRATORY_TRACT | 11 refills | Status: DC

## 2020-06-21 NOTE — Patient Instructions (Addendum)
We will get you in for a colonoscopy.  You can get a 4th shot of covid vaccine when ready.    Health Maintenance, Male Adopting a healthy lifestyle and getting preventive care are important in promoting health and wellness. Ask your health care provider about:  The right schedule for you to have regular tests and exams.  Things you can do on your own to prevent diseases and keep yourself healthy. What should I know about diet, weight, and exercise? Eat a healthy diet  Eat a diet that includes plenty of vegetables, fruits, low-fat dairy products, and lean protein.  Do not eat a lot of foods that are high in solid fats, added sugars, or sodium.   Maintain a healthy weight Body mass index (BMI) is a measurement that can be used to identify possible weight problems. It estimates body fat based on height and weight. Your health care provider can help determine your BMI and help you achieve or maintain a healthy weight. Get regular exercise Get regular exercise. This is one of the most important things you can do for your health. Most adults should:  Exercise for at least 150 minutes each week. The exercise should increase your heart rate and make you sweat (moderate-intensity exercise).  Do strengthening exercises at least twice a week. This is in addition to the moderate-intensity exercise.  Spend less time sitting. Even light physical activity can be beneficial. Watch cholesterol and blood lipids Have your blood tested for lipids and cholesterol at 69 years of age, then have this test every 5 years. You may need to have your cholesterol levels checked more often if:  Your lipid or cholesterol levels are high.  You are older than 69 years of age.  You are at high risk for heart disease. What should I know about cancer screening? Many types of cancers can be detected early and may often be prevented. Depending on your health history and family history, you may need to have cancer  screening at various ages. This may include screening for:  Colorectal cancer.  Prostate cancer.  Skin cancer.  Lung cancer. What should I know about heart disease, diabetes, and high blood pressure? Blood pressure and heart disease  High blood pressure causes heart disease and increases the risk of stroke. This is more likely to develop in people who have high blood pressure readings, are of African descent, or are overweight.  Talk with your health care provider about your target blood pressure readings.  Have your blood pressure checked: ? Every 3-5 years if you are 32-91 years of age. ? Every year if you are 53 years old or older.  If you are between the ages of 43 and 97 and are a current or former smoker, ask your health care provider if you should have a one-time screening for abdominal aortic aneurysm (AAA). Diabetes Have regular diabetes screenings. This checks your fasting blood sugar level. Have the screening done:  Once every three years after age 8 if you are at a normal weight and have a low risk for diabetes.  More often and at a younger age if you are overweight or have a high risk for diabetes. What should I know about preventing infection? Hepatitis B If you have a higher risk for hepatitis B, you should be screened for this virus. Talk with your health care provider to find out if you are at risk for hepatitis B infection. Hepatitis C Blood testing is recommended for:  Everyone born  from [place] through 1965.  Anyone with known risk factors for hepatitis C. Sexually transmitted infections (STIs)  You should be screened each year for STIs, including gonorrhea and chlamydia, if: ? You are sexually active and are younger than 69 years of age. ? You are older than 69 years of age and your health care provider tells you that you are at risk for this type of infection. ? Your sexual activity has changed since you were last screened, and you are at increased risk  for chlamydia or gonorrhea. Ask your health care provider if you are at risk.  Ask your health care provider about whether you are at high risk for HIV. Your health care provider may recommend a prescription medicine to help prevent HIV infection. If you choose to take medicine to prevent HIV, you should first get tested for HIV. You should then be tested every 3 months for as long as you are taking the medicine. Follow these instructions at home: Lifestyle  Do not use any products that contain nicotine or tobacco, such as cigarettes, e-cigarettes, and chewing tobacco. If you need help quitting, ask your health care provider.  Do not use street drugs.  Do not share needles.  Ask your health care provider for help if you need support or information about quitting drugs. Alcohol use  Do not drink alcohol if your health care provider tells you not to drink.  If you drink alcohol: ? Limit how much you have to 0-2 drinks a day. ? Be aware of how much alcohol is in your drink. In the [place] one drink equals one 12 oz bottle of beer (355 mL), one 5 oz glass of wine (148 mL), or one 1 oz glass of hard liquor (44 mL). General instructions  Schedule regular health, dental, and eye exams.  Stay current with your vaccines.  Tell your health care provider if: ? You often feel depressed. ? You have ever been abused or do not feel safe at home. Summary  Adopting a healthy lifestyle and getting preventive care are important in promoting health and wellness.  Follow your health care provider's instructions about healthy diet, exercising, and getting tested or screened for diseases.  Follow your health care provider's instructions on monitoring your cholesterol and blood pressure. This information is not intended to replace advice given to you by your health care provider. Make sure you discuss any questions you have with your health care provider. Document Revised: 02/24/2018 Document Reviewed:  02/24/2018 Elsevier Patient Education  2021 [employer].

## 2020-06-21 NOTE — Progress Notes (Signed)
Subjective:   Patient ID: [name], male    DOB: 23-Sep-1950, 70 y.o.   MRN: [id number]  HPI Here for medicare wellness and physical, no new complaints. Please see A/P for status and treatment of chronic medical problems.   Diet: heart healthy Physical activity: sedentary Depression/mood screen: negative Hearing: intact to whispered voice Visual acuity: grossly normal with lens, performs annual eye exam  ADLs: capable Fall risk: none Home safety: good Cognitive evaluation: intact to orientation, naming, recall and repetition EOL planning: adv directives discussed  [place] Visit from 06/21/2020 in [place] at [facility] Total Score 0      I have personally reviewed and have noted 1. The patient's medical and social history - reviewed today no changes 2. Their use of alcohol, tobacco or illicit drugs 3. Their current medications and supplements 4. The patient's functional ability including ADL's, fall risks, home safety risks and hearing or visual impairment. 5. Diet and physical activities 6. Evidence for depression or mood disorders 7. Care team reviewed and updated 8.  The patient is not on an opioid pain medication.  Patient Care Team: Hoyt Koch, MD as PCP - General (Internal Medicine) Jerline Pain, MD as PCP - Cardiology (Cardiology) Alexander Mt (Inactive) as Physician Assistant (Neurology) Michael Boston, MD as Attending Physician (General Surgery) Jerline Pain, MD as Consulting Physician (Cardiology) Charlton Haws, [facility] as Pharmacist (Pharmacist) Past Medical History:  Diagnosis Date  . Adenomatous colon polyp   . Alcohol use   . Antiphospholipid antibody positive    ???In the past???but not proven according to patient  . Antiphospholipid antibody positive    ??? in the past, but not proven according to the patient.  . Atrial flutter ([place])    atrial flutter ablation,   Dr.Allred October 18, 2010  . BPH  (benign prostatic hypertrophy)   . CAD (coronary artery disease)    Catheterization 2005 disease / catheterization July 06, 2010, chronic total occlusion distal RCA with collaterals from right and left side.  Medical therapy recommended, mild decreased LV function       . Carotid artery disease ([facility])    1-88% R. ICA, 41-66% LICA... Doppler.... January, 2010  /  Doppler... in January, 2011 no change  /  doppler1/27/2012...stable  0-63% RICA 01-60% LICA  . CHF (congestive heart failure) ([place])   . COPD (chronic obstructive pulmonary disease) ([place])   . CRAO (central retinal artery occlusion)   . Ejection fraction     EF 50-55%, echo, March, 2012... inferobasal and distal septal hypokinesis /      60%, echo, November, 2008  . GERD (gastroesophageal reflux disease)   . Hypercholesterolemia   . Hypertension   . Myocardial infarction ([place])   . Nonsustained ventricular tachycardia ([place])   . Palpitations    probable SVT, rate 170,, at home,, lasted one hour,, 2012  . Prostate cancer ([place])   . Raynaud's syndrome   . Stroke [facility])    mild stroke per pt   . TIA (transient ischemic attack)    Possible small vessel tia in the past.  . Tobacco abuse    Past Surgical History:  Procedure Laterality Date  . CARDIAC CATHETERIZATION  06/27/2003   2005.... mild irregularity of the LAD..( patient had had abnormal Myoview scan)  . CARDIOVERSION N/A 03/06/2016   Procedure: CARDIOVERSION;  Surgeon: Josue Hector, MD;  Location[facility] ENDOSCOPY;  Service: Cardiovascular;  Laterality: N/A;  . INGUINAL  HERNIA REPAIR  2009   right with mesh.  Dr Marlou Starks  . TEE WITHOUT CARDIOVERSION N/A 03/06/2016   Procedure: TRANSESOPHAGEAL ECHOCARDIOGRAM (TEE);  Surgeon: Josue Hector, MD;  Location: [facility];  Service: Cardiovascular;  Laterality: N/A;  . THYROGLOSSAL DUCT CYST  2006   Dr. Wilburn Cornelia  . TRANSURETHRAL RESECTION OF PROSTATE N/A 05/15/2020   Procedure: TRANSURETHRAL RESECTION OF THE PROSTATE (TURP);  Surgeon:  Irine Seal, MD;  Location: [facility] ORS;  Service: Urology;  Laterality: N/A;   Family History  Adopted: Yes   Review of Systems  Constitutional: Negative.   HENT: Negative.   Eyes: Negative.   Respiratory: Negative for cough, chest tightness and shortness of breath.   Cardiovascular: Negative for chest pain, palpitations and leg swelling.  Gastrointestinal: Negative for abdominal distention, abdominal pain, constipation, diarrhea, nausea and vomiting.  Musculoskeletal: Negative.   Skin: Negative.   Neurological: Negative.   Psychiatric/Behavioral: Negative.     Objective:  Physical Exam Constitutional:      Appearance: He is well-developed.  HENT:     Head: Normocephalic and atraumatic.  Cardiovascular:     Rate and Rhythm: Normal rate and regular rhythm.  Pulmonary:     Effort: Pulmonary effort is normal. No respiratory distress.     Breath sounds: Normal breath sounds. No wheezing or rales.  Abdominal:     General: Bowel sounds are normal. There is no distension.     Palpations: Abdomen is soft.     Tenderness: There is no abdominal tenderness. There is no rebound.  Musculoskeletal:     Cervical back: Normal range of motion.  Skin:    General: Skin is warm and dry.  Neurological:     Mental Status: He is alert and oriented to person, place, and time.     Coordination: Coordination normal.     Vitals:   06/21/20 0801  BP: 122/64  Pulse: 72  Resp: 18  Temp: 97.8 F (36.6 C)  TempSrc: Oral  SpO2: 100%  Weight: 166 lb 12.8 oz (75.7 kg)  Height: 6\' 2"  (1.88 m)   This visit occurred during the SARS-CoV-2 public health emergency.  Safety protocols were in place, including screening questions prior to the visit, additional usage of staff PPE, and extensive cleaning of exam room while observing appropriate contact time as indicated for disinfecting solutions.   Assessment & Plan:

## 2020-06-22 DIAGNOSIS — J410 Simple chronic bronchitis: Secondary | ICD-10-CM | POA: Insufficient documentation

## 2020-06-22 NOTE — Assessment & Plan Note (Signed)
Flu shot counseled. Covid-19 3 shots. Pneumonia complete. Shingrix counseled. Tetanus due 2025. Colonoscopy due referral to GI placed. Counseled about sun safety and mole surveillance. Counseled about the dangers of distracted driving. Given 10 year screening recommendations.

## 2020-06-22 NOTE — Assessment & Plan Note (Signed)
Taking lipitor and recent lipid panel at goal.

## 2020-06-22 NOTE — Assessment & Plan Note (Signed)
Counseled to quit smoking and he has no ability to make quit attempt currently. Reminded about his current complications associated with smoking and future consequences.

## 2020-06-22 NOTE — Assessment & Plan Note (Signed)
On amiodarone, xarelto, metoprolol. Sounds regular on exam today.

## 2020-06-22 NOTE — Assessment & Plan Note (Signed)
BP at goal on metoprolol and lisinopril

## 2020-06-22 NOTE — Assessment & Plan Note (Signed)
Refilled stiolto. Counseled to quit smoking.

## 2020-06-28 ENCOUNTER — Encounter: Payer: Self-pay | Admitting: Gastroenterology

## 2020-07-19 DIAGNOSIS — N3941 Urge incontinence: Secondary | ICD-10-CM | POA: Diagnosis not present

## 2020-07-19 DIAGNOSIS — R8279 Other abnormal findings on microbiological examination of urine: Secondary | ICD-10-CM | POA: Diagnosis not present

## 2020-07-23 MED ORDER — ALBUTEROL SULFATE HFA 108 (90 BASE) MCG/ACT IN AERS: INHALATION_SPRAY | RESPIRATORY_TRACT | 2 refills | Status: DC

## 2020-07-23 NOTE — Addendum Note (Signed)
Addended by: Charlton Haws on: 07/23/2020 10:31 AM   Modules accepted: Orders

## 2020-07-31 ENCOUNTER — Telehealth: Payer: Self-pay | Admitting: Pharmacist

## 2020-07-31 NOTE — Progress Notes (Signed)
    Chronic Care Management Pharmacy Assistant   Name: [name]  MRN: [id number] DOB: October 01, 1950  Reason for Encounter: Medication Review   Medications: Outpatient Encounter Medications as of 07/31/2020  Medication Sig  . albuterol (VENTOLIN HFA) 108 (90 Base) MCG/ACT inhaler INHALE TWO PUFFS into THE lungs EVERY 4 HOURS AS NEEDED FOR SHORTNESS OF BREATH OR wheezing  . amiodarone (PACERONE) 200 MG tablet TAKE 1/2 TABLET(100 MG) BY MOUTH DAILY (Patient taking differently: Take 100 mg by mouth daily.)  . atorvastatin (LIPITOR) 40 MG tablet TAKE ONE TABLET BY MOUTH ONCE DAILY (Patient taking differently: Take 40 mg by mouth daily. Pt takes in the pm)  . b complex vitamins capsule Take 1 capsule by mouth daily.  . finasteride (PROSCAR) 5 MG tablet Take 5 mg by mouth daily.  . folic acid (FOLVITE) 563 MCG tablet Take 400 mcg by mouth daily.  Marland Kitchen lisinopril (ZESTRIL) 10 MG tablet TAKE 1 TABLET(10 MG) BY MOUTH DAILY (Patient taking differently: Take 10 mg by mouth daily.)  . metoprolol succinate (TOPROL-XL) 50 MG 24 hr tablet TAKE ONE TABLET BY MOUTH ONCE DAILY WITH A MEAL OR immediately following (Patient taking differently: Take 50 mg by mouth daily.)  . Multiple Vitamin (MULTIVITAMIN WITH MINERALS) TABS tablet Take 1 tablet by mouth daily.  . Tiotropium Bromide-Olodaterol (STIOLTO RESPIMAT) 2.5-2.5 MCG/ACT AERS Inhale 2 puffs into the lungs daily.  Alveda Reasons 20 MG TABS tablet TAKE ONE TABLET BY MOUTH ONCE DAILY (Patient taking differently: Pt takes in the pm)   No facility-administered encounter medications on file as of 07/31/2020.    Reviewed chart for medication changes ahead of medication coordination call.  No OVs, Consults, or hospital visits since last care coordination call/Pharmacist visit. (If appropriate, list visit date, provider name)  No medication changes indicated OR if recent visit, treatment plan here.  BP Readings from Last 3 Encounters:  06/21/20 122/64  05/16/20  113/69  05/03/20 (!) 150/71    No results found for: HGBA1C   Reviewed chart for medication changes ahead of medication coordination call.  Patient obtains medications through 90 Days Vials    Patient is due for next adherence delivery on: 08/10/20 Attempted to contact patient x 3 for medication review and delivery, unable to reach patient, left voicemails to return call.  Tentatively scheduled delivery of medications for 08/10/20 pending pharmacy's ability to verify with patient.  This delivery to include: Amiodarone 200mg  Metoprolol 50mg  Lisinopril 10mg  Finasteride 5mg  Atorvastatin 40mg   Star Rating Drugs: Atorvastatin - last fill 05/01/20 90D Lisinopril - last fill 06/21/20 [place], RMA Clinical Pharmacists Assistant [phone]  Time Spent: 41

## 2020-08-02 ENCOUNTER — Other Ambulatory Visit: Payer: Self-pay | Admitting: Internal Medicine

## 2020-08-02 ENCOUNTER — Telehealth: Payer: Self-pay | Admitting: Pharmacist

## 2020-08-02 ENCOUNTER — Other Ambulatory Visit: Payer: Self-pay | Admitting: Cardiology

## 2020-08-02 NOTE — Progress Notes (Signed)
    Chronic Care Management Pharmacy Assistant   Name: [name]  MRN: [id number] DOB: April 13, 1950   Reason for Encounter: Medication Coordination Call    Recent office visits:  06/21/20 Dr. Sharlet Salina, referral to Gastroenterology  Recent consult visits:  None ID  Hospital visits:  Medication Reconciliation was completed by comparing discharge summary, patient's EMR and Pharmacy list, and upon discussion with patient.  Admitted to the hospital on 05/15/20 due to UTI. Discharge date was 05/16/20. Discharged from [place]?Medications Started at [facility] Discharge:?? -started   Medication Changes at Hospital Discharge: -Changed   Medications Discontinued at Hospital Discharge: -Stopped  Medications that remain the same after Hospital Discharge:??  -All other medications will remain the same.    Medications: Outpatient Encounter Medications as of 08/02/2020  Medication Sig  . albuterol (VENTOLIN HFA) 108 (90 Base) MCG/ACT inhaler INHALE TWO PUFFS into THE lungs EVERY 4 HOURS AS NEEDED FOR SHORTNESS OF BREATH OR wheezing  . amiodarone (PACERONE) 200 MG tablet TAKE 1/2 TABLET BY MOUTH ONCE DAILY  . atorvastatin (LIPITOR) 40 MG tablet TAKE ONE TABLET BY MOUTH ONCE DAILY (Patient taking differently: Take 40 mg by mouth daily. Pt takes in the pm)  . b complex vitamins capsule Take 1 capsule by mouth daily.  . finasteride (PROSCAR) 5 MG tablet Take 5 mg by mouth daily.  . folic acid (FOLVITE) 272 MCG tablet Take 400 mcg by mouth daily.  Marland Kitchen lisinopril (ZESTRIL) 10 MG tablet TAKE 1 TABLET(10 MG) BY MOUTH DAILY (Patient taking differently: Take 10 mg by mouth daily.)  . metoprolol succinate (TOPROL-XL) 50 MG 24 hr tablet TAKE ONE TABLET BY MOUTH ONCE DAILY WITH FOOD OR immediately following  . Multiple Vitamin (MULTIVITAMIN WITH MINERALS) TABS tablet Take 1 tablet by mouth daily.  . Tiotropium Bromide-Olodaterol (STIOLTO RESPIMAT) 2.5-2.5 MCG/ACT AERS Inhale 2 puffs into  the lungs daily.  Alveda Reasons 20 MG TABS tablet TAKE ONE TABLET BY MOUTH ONCE DAILY (Patient taking differently: Pt takes in the pm)   No facility-administered encounter medications on file as of 08/02/2020.    Reviewed chart for medication changes ahead of medication coordination call.  No OVs, Consults, or hospital visits since last care coordination call/Pharmacist visit. (If appropriate, list visit date, provider name)  No medication changes indicated OR if recent visit, treatment plan here.  BP Readings from Last 3 Encounters:  06/21/20 122/64  05/16/20 113/69  05/03/20 (!) 150/71    No results found for: HGBA1C   Patient obtains medications through Vials  90 Days   Last adherence delivery included:  Stiolto Aer 2.5-2.5 Inhale 2 puffs into the lungs daily Albuterol Inhale two puffs by mouth into lungs every 4 hours as needed for shortness of breath or for wheezing   Patient is due for next adherence delivery on: 08/03/20. Called patient and reviewed medications and coordinated delivery.  This delivery to include: B Complex vitamins 1 cap daily Metoprolol succinate 50 mg 1 tab daily Amiodarone 200 mg 1/2 tab daily Lisinopril 10 mg 1 tab daily Atorvastatin 40 mg 1 tab daily   Confirmed delivery date of 08/03/20, advised patient that pharmacy will contact them the morning of delivery.  [place] Pharmacist Assistant [phone]  Time spent:30

## 2020-08-03 ENCOUNTER — Ambulatory Visit: Payer: Medicare HMO | Admitting: Gastroenterology

## 2020-08-03 ENCOUNTER — Encounter: Payer: Self-pay | Admitting: Gastroenterology

## 2020-08-03 ENCOUNTER — Telehealth: Payer: Self-pay

## 2020-08-03 VITALS — BP 146/72 | HR 51 | Ht 74.0 in | Wt 168.8 lb

## 2020-08-03 DIAGNOSIS — K625 Hemorrhage of anus and rectum: Secondary | ICD-10-CM | POA: Diagnosis not present

## 2020-08-03 DIAGNOSIS — K5900 Constipation, unspecified: Secondary | ICD-10-CM | POA: Diagnosis not present

## 2020-08-03 MED ORDER — SUPREP BOWEL PREP KIT 17.5-3.13-1.6 GM/177ML PO SOLN: 1.0000 | ORAL | 0 refills | Status: DC

## 2020-08-03 NOTE — Progress Notes (Signed)
Review of pertinent gastrointestinal problems: 1.  History of precancerous colon polyps.  Colonoscopy 2008 found 3 polyps, one was greater than 1 cm.  2 of these were tubular adenomas and one was a hyperplastic polyp.  Colonoscopy 2011 2 subcentimeter polyps were removed one was a tubular adenoma.   HPI: This is a very pleasant 70 year old man who was referred to me by Hoyt Koch, *  to evaluate mild rectal bleeding, sensation of incomplete evacuation.    I last saw him 10 or 11 years ago at the time of a colonoscopy.  See that results summarized above.  He is here because he intermittently sees a small amount of blood in his stool when he moves his bowels.  This occurs about twice per month.  He also has a sensation of incomplete evacuation.  He moves his bowels once a day and feels not all of it is coming out or that there is some left inside.  He does not have to particularly strain to move his bowels.  He really is not bothered by diarrhea.  He drinks 2 cups of coffee a day.  He is adopted and so is not aware if he has colon cancer in his genetics, family tree  He drinks 2-3 beers nightly  He has gained 4 5 pounds in the past several months.  Old Data Reviewed: Blood work February 2022, normal CBC normal basic profile  Prostate biopsy March 2022 prostate cancer, Gleason score 3+4 = 7.    Review of systems: Pertinent positive and negative review of systems were noted in the above HPI section. All other review negative.   Past Medical History:  Diagnosis Date  . Adenomatous colon polyp   . Alcohol use   . Antiphospholipid antibody positive    ???In the past???but not proven according to patient  . Antiphospholipid antibody positive    ??? in the past, but not proven according to the patient.  . Atrial flutter ([place])    atrial flutter ablation,   Dr.Allred October 18, 2010  . BPH (benign prostatic hypertrophy)   . CAD (coronary artery disease)    Catheterization  2005 disease / catheterization July 06, 2010, chronic total occlusion distal RCA with collaterals from right and left side.  Medical therapy recommended, mild decreased LV function       . Carotid artery disease (HCC)    5-57% R. ICA, 32-20% LICA... Doppler.... January, 2010  /  Doppler... in January, 2011 no change  /  doppler1/27/2012...stable  2-54% RICA 27-06% LICA  . CHF (congestive heart failure) ([place])   . COPD (chronic obstructive pulmonary disease) ([place])   . CRAO (central retinal artery occlusion)   . Ejection fraction     EF 50-55%, echo, March, 2012... inferobasal and distal septal hypokinesis /      60%, echo, November, 2008  . GERD (gastroesophageal reflux disease)   . Hypercholesterolemia   . Hypertension   . Myocardial infarction ([place])   . Nonsustained ventricular tachycardia ([place])   . Palpitations    probable SVT, rate 170,, at home,, lasted one hour,, 2012  . Prostate cancer ([place])   . Raynaud's syndrome   . Stroke [facility])    mild stroke per pt   . TIA (transient ischemic attack)    Possible small vessel tia in the past.  . Tobacco abuse     Past Surgical History:  Procedure Laterality Date  . CARDIAC CATHETERIZATION  06/27/2003   2005.... mild irregularity of the  LAD..( patient had had abnormal Myoview scan)  . CARDIOVERSION N/A 03/06/2016   Procedure: CARDIOVERSION;  Surgeon: Josue Hector, MD;  Location: [facility] ENDOSCOPY;  Service: Cardiovascular;  Laterality: N/A;  . INGUINAL HERNIA REPAIR  2009   right with mesh.  Dr Marlou Starks  . TEE WITHOUT CARDIOVERSION N/A 03/06/2016   Procedure: TRANSESOPHAGEAL ECHOCARDIOGRAM (TEE);  Surgeon: Josue Hector, MD;  Location: [place];  Service: Cardiovascular;  Laterality: N/A;  . THYROGLOSSAL DUCT CYST  2006   Dr. Wilburn Cornelia  . TRANSURETHRAL RESECTION OF PROSTATE N/A 05/15/2020   Procedure: TRANSURETHRAL RESECTION OF THE PROSTATE (TURP);  Surgeon: Irine Seal, MD;  Location: [facility] ORS;  Service: Urology;  Laterality: N/A;    Current  Outpatient Medications  Medication Sig Dispense Refill  . albuterol (VENTOLIN HFA) 108 (90 Base) MCG/ACT inhaler INHALE TWO PUFFS into THE lungs EVERY 4 HOURS AS NEEDED FOR SHORTNESS OF BREATH OR wheezing 8.5 g 2  . amiodarone (PACERONE) 200 MG tablet TAKE 1/2 TABLET BY MOUTH ONCE DAILY 45 tablet 1  . atorvastatin (LIPITOR) 40 MG tablet TAKE ONE TABLET BY MOUTH DAILY 90 tablet 1  . b complex vitamins capsule Take 1 capsule by mouth daily.    . finasteride (PROSCAR) 5 MG tablet Take 5 mg by mouth daily.    . folic acid (FOLVITE) 536 MCG tablet Take 400 mcg by mouth daily.    Marland Kitchen lisinopril (ZESTRIL) 10 MG tablet TAKE ONE TABLET BY MOUTH ONCE DAILY 90 tablet 1  . metoprolol succinate (TOPROL-XL) 50 MG 24 hr tablet TAKE ONE TABLET BY MOUTH ONCE DAILY WITH FOOD OR immediately following 90 tablet 1  . Multiple Vitamin (MULTIVITAMIN WITH MINERALS) TABS tablet Take 1 tablet by mouth daily. 30 tablet 0  . Tiotropium Bromide-Olodaterol (STIOLTO RESPIMAT) 2.5-2.5 MCG/ACT AERS Inhale 2 puffs into the lungs daily. 12 g 11  . XARELTO 20 MG TABS tablet TAKE ONE TABLET BY MOUTH ONCE DAILY (Patient taking differently: Pt takes in the pm - Managed by Dr. [name]) 90 tablet 3   No current facility-administered medications for this visit.    Allergies as of 08/03/2020 - Review Complete 08/03/2020  Allergen Reaction Noted  . Cialis [tadalafil] Other (See Comments) 10/02/2011  . Levitra [vardenafil] Other (See Comments) 10/02/2011  . Viagra [sildenafil citrate] Other (See Comments) 10/02/2011    Family History  Adopted: Yes  Family history unknown: Yes    Social History   Socioeconomic History  . Marital status: Married    Spouse name: Not on file  . Number of children: Not on file  . Years of education: Not on file  . Highest education level: Not on file  Occupational History  . Occupation: land Teacher, English as a foreign language retired  Tobacco Use  . Smoking status: Current Every Day Smoker    Packs/day: 0.50    Years:  50.00    Pack years: 25.00    Types: Cigarettes    Start date: 17  . Smokeless tobacco: Never Used  . Tobacco comment: 11/30/19 .5-1pack per day  Vaping Use  . Vaping Use: Never used  Substance and Sexual Activity  . Alcohol use: Yes    Alcohol/week: 14.0 - 21.0 standard drinks    Types: 14 - 21 Cans of beer per week  . Drug use: No  . Sexual activity: Not Currently  Other Topics Concern  . Not on file  Social History Narrative  . Not on file   Social Determinants of Health   Financial Resource Strain: Medium Risk  .  Difficulty of Paying Living Expenses: Somewhat hard  Food Insecurity: Not on file  Transportation Needs: Not on file  Physical Activity: Not on file  Stress: Not on file  Social Connections: Not on file  Intimate Partner Violence: Not on file     Physical Exam: BP (!) 146/72   Pulse (!) 51   Ht 6\' 2"  (1.88 m)   Wt 168 lb 12.8 oz (76.6 kg)   SpO2 99%   BMI 21.67 kg/m  Constitutional: generally well-appearing Psychiatric: alert and oriented x3 Eyes: extraocular movements intact Mouth: oral pharynx moist, no lesions Neck: supple no lymphadenopathy Cardiovascular: heart regular rate and rhythm Lungs: clear to auscultation bilaterally Abdomen: soft, nontender, nondistended, no obvious ascites, no peritoneal signs, normal bowel sounds Extremities: no lower extremity edema bilaterally Skin: no lesions on visible extremities   Assessment and plan: 70 y.o. male with minor intermittent rectal bleeding, not anemic, sensation of incomplete evacuation, personal history of adenomatous colon polyps  I recommended he undergo colonoscopy for his personal history of adenomatous colon polyps, his minor intermittent rectal bleeding.  I think it is unlikely that he has a cancer currently in his colon.  His biggest bother is sensation of incomplete evacuation and I recommended that he start taking an over-the-counter fiber supplement Citrucel daily as that is usually  quite effective for his symptoms.  I see no reason for blood tests or imaging studies prior to his colonoscopy.  He understands that he would be at increased risk for procedure related complications of bleeding since he is on a chronic blood thinner.  I asked that he hold his blood thinner for 2 days and we will reach out to his cardiologist to make sure that he is okay with that recommendation.  Please see the "Patient Instructions" section for addition details about the plan.   Owens Loffler, MD [facility] 08/03/2020, 9:50 AM  Cc: Hoyt Koch, *  Total time on date of encounter was 45 minutes (this included time spent preparing to see the patient reviewing records; obtaining and/or reviewing separately obtained history; performing a medically appropriate exam and/or evaluation; counseling and educating the patient and family if present; ordering medications, tests or procedures if applicable; and documenting clinical information in the health record).

## 2020-08-03 NOTE — Telephone Encounter (Signed)
[facility] Pre-operative Risk Assessment     Request for surgical clearance:     Endoscopy Procedure  What type of surgery is being performed?     colonoscopy  When is this surgery scheduled?     08-10-2020  What type of clearance is required ?   Pharmacy  Are there any medications that need to be held prior to surgery and how long? Xarelto x 2 days  Practice name and name of physician performing surgery?      [facility]  What is your office phone and fax number?      Phone- [phone]  [phone]  Anesthesia type (None, local, MAC, general) ?       MAC

## 2020-08-03 NOTE — Patient Instructions (Signed)
If you are age 69 or older, your body mass index should be between 23-30. Your Body mass index is 21.67 kg/m. If this is out of the aforementioned range listed, please consider follow up with your Primary Care Provider.  You have been scheduled for a colonoscopy. Please follow written instructions given to you at your visit today.  Please pick up your prep supplies at the pharmacy within the next 1-3 days. If you use inhalers (even only as needed), please bring them with you on the day of your procedure.  Due to recent changes in healthcare laws, you may see the results of your imaging and laboratory studies on MyChart before your provider has had a chance to review them.  We understand that in some cases there may be results that are confusing or concerning to you. Not all laboratory results come back in the same time frame and the provider may be waiting for multiple results in order to interpret others.  Please give [place] 48 hours in order for your provider to thoroughly review all the results before contacting the office for clarification of your results.   Please start taking citrucel (orange flavored) powder fiber supplement.  This may cause some bloating at first but that usually goes away. Begin with a small spoonful and work your way up to a large, heaping spoonful daily over a week.  You will be contacted by our office prior to your procedure for directions on holding your Xarelto.  If you do not hear from our office 1 week prior to your scheduled procedure, please call [phone] to discuss.   The [facility] providers would like to encourage you to use [facility] to communicate with providers for non-urgent requests or questions.  Due to long hold times on the telephone, sending your provider a message by [facility] may be a faster and more efficient way to get a response.  Please allow 48 business hours for a response.  Please remember that this is for non-urgent requests.   Thank you for  entrusting me with your care and choosing [facility].  Dr Ardis Hughs

## 2020-08-03 NOTE — Telephone Encounter (Signed)
Patient with diagnosis of A Fib on Xarelto for anticoagulation.    Procedure: colonoscopy Date of procedure: 08/10/20   CHA2DS2-VASc Score = 6  This indicates a 9.7% annual risk of stroke. The patient's score is based upon: CHF History: Yes HTN History: Yes Diabetes History: No Stroke History: Yes Vascular Disease History: Yes Age Score: 1 Gender Score: 0  CrCl 69 mL/min Platelet count 268K  Patient has history of TIA.  Will route to Dr [name] for input

## 2020-08-04 NOTE — Telephone Encounter (Signed)
He may hold his Xarelto for 2 days prior to endoscopic procedure to minimize bleeding risk from procedure.  Resume Xarelto postprocedure as directed by GI. Candee Furbish, MD

## 2020-08-06 ENCOUNTER — Telehealth: Payer: Self-pay | Admitting: Gastroenterology

## 2020-08-06 MED ORDER — SUPREP BOWEL PREP KIT 17.5-3.13-1.6 GM/177ML PO SOLN: 1.0000 | ORAL | 0 refills | Status: DC

## 2020-08-06 NOTE — Telephone Encounter (Signed)
Inbound call from patient. States his [facility] need prescription for prep sent in again. States there was a mixed up.

## 2020-08-06 NOTE — Telephone Encounter (Signed)
Rx for Suprep sent to [facility] as requested.

## 2020-08-06 NOTE — Telephone Encounter (Signed)
   Chart reviewed per Preop protocol coverage.  Request is for holding anticoagulation only.  Please see notes from Dr. [name]. Please call with questions.  Richardson Dopp, PA-C    08/06/2020 8:17 AM

## 2020-08-06 NOTE — Telephone Encounter (Signed)
Patient's wife Eustaquio Maize advised that he has been given clearance to hold Xarelto 2 days prior to colonoscopy scheduled for 08-10-2020.  Patient's wife Eustaquio Maize advised to take last dose of Xarelto on 08-07-2020, and he will be advised when to restart Xarelto by Dr Ardis Hughs after the procedure.  Patient's wife Eustaquio Maize agreed to plan and verbalized understanding.  No further questions.

## 2020-08-07 ENCOUNTER — Encounter: Payer: Self-pay | Admitting: Gastroenterology

## 2020-08-09 ENCOUNTER — Encounter: Payer: Self-pay | Admitting: Certified Registered Nurse Anesthetist

## 2020-08-10 ENCOUNTER — Other Ambulatory Visit: Payer: Self-pay

## 2020-08-10 ENCOUNTER — Ambulatory Visit (AMBULATORY_SURGERY_CENTER): Payer: Medicare HMO | Admitting: Gastroenterology

## 2020-08-10 ENCOUNTER — Encounter: Payer: Self-pay | Admitting: Gastroenterology

## 2020-08-10 VITALS — BP 108/60 | HR 76 | Temp 97.8°F | Resp 16 | Ht 74.0 in | Wt 168.0 lb

## 2020-08-10 DIAGNOSIS — J449 Chronic obstructive pulmonary disease, unspecified: Secondary | ICD-10-CM | POA: Diagnosis not present

## 2020-08-10 DIAGNOSIS — K625 Hemorrhage of anus and rectum: Secondary | ICD-10-CM | POA: Diagnosis not present

## 2020-08-10 DIAGNOSIS — D123 Benign neoplasm of transverse colon: Secondary | ICD-10-CM

## 2020-08-10 DIAGNOSIS — Z86010 Personal history of colonic polyps: Secondary | ICD-10-CM

## 2020-08-10 DIAGNOSIS — I2510 Atherosclerotic heart disease of native coronary artery without angina pectoris: Secondary | ICD-10-CM | POA: Diagnosis not present

## 2020-08-10 DIAGNOSIS — D125 Benign neoplasm of sigmoid colon: Secondary | ICD-10-CM

## 2020-08-10 DIAGNOSIS — K5900 Constipation, unspecified: Secondary | ICD-10-CM | POA: Diagnosis not present

## 2020-08-10 HISTORY — PX: COLONOSCOPY: SHX174

## 2020-08-10 MED ORDER — SODIUM CHLORIDE 0.9 % IV SOLN: 500.0000 mL | Freq: Once | INTRAVENOUS | Status: DC

## 2020-08-10 NOTE — Progress Notes (Signed)
Called to room to assist during endoscopic procedure.  Patient ID and intended procedure confirmed with present staff. Received instructions for my participation in the procedure from the performing physician.  

## 2020-08-10 NOTE — Progress Notes (Signed)
Report given to PACU, vss 

## 2020-08-10 NOTE — Progress Notes (Signed)
1000 Ephedrine 10 mg given IV due to low BP, MD updated.

## 2020-08-10 NOTE — Progress Notes (Signed)
VS taken by C.W. 

## 2020-08-10 NOTE — Op Note (Signed)
[facility] Patient Name: [name] Procedure Date: 08/10/2020 9:52 AM MRN: [id number] Endoscopist: Milus Banister , MD Age: 70 Referring MD:  Date of Birth: 08-29-1950 Gender: Male Account #: [id number] Procedure:                Colonoscopy Indications:              High risk colon cancer surveillance: Personal                            history of colonic polyps; Colonoscopy 2008 found 3                            polyps, one was greater than 1 cm. 2 of these were                            tubular adenomas and one was a hyperplastic polyp.                            Colonoscopy 2011 2 subcentimeter polyps were                            removed one was a tubular adenoma. Medicines:                Monitored Anesthesia Care Procedure:                Pre-Anesthesia Assessment:                           - Prior to the procedure, a History and Physical                            was performed, and patient medications and                            allergies were reviewed. The patient's tolerance of                            previous anesthesia was also reviewed. The risks                            and benefits of the procedure and the sedation                            options and risks were discussed with the patient.                            All questions were answered, and informed consent                            was obtained. Prior Anticoagulants: The patient has                            taken Xarelto (rivaroxaban), last dose was 2 days  prior to procedure. ASA Grade Assessment: III - A                            patient with severe systemic disease. After                            reviewing the risks and benefits, the patient was                            deemed in satisfactory condition to undergo the                            procedure.                           After obtaining informed consent, the colonoscope                             was passed under direct vision. Throughout the                            procedure, the patient's blood pressure, pulse, and                            oxygen saturations were monitored continuously. The                            Olympus CF-[id number]) Colonoscope was                            introduced through the anus and advanced to the the                            cecum, identified by appendiceal orifice and                            ileocecal valve. The colonoscopy was performed                            without difficulty. The patient tolerated the                            procedure well. The quality of the bowel                            preparation was good. The ileocecal valve,                            appendiceal orifice, and rectum were photographed. Scope In: 9:57:29 AM Scope Out: 10:08:51 AM Scope Withdrawal Time: 0 hours 9 minutes 2 seconds  Total Procedure Duration: 0 hours 11 minutes 22 seconds  Findings:                 Three sessile polyps were found in the sigmoid  colon and transverse colon. The polyps were 2 to 3                            mm in size. These polyps were removed with a cold                            snare. Resection and retrieval were complete.                           Multiple small and large-mouthed diverticula were                            found in the left colon.                           Small internal hemorrhoids.                           The exam was otherwise without abnormality on                            direct and retroflexion views. Complications:            No immediate complications. Estimated blood loss:                            None. Estimated Blood Loss:     Estimated blood loss: none. Impression:               - Three 2 to 3 mm polyps in the sigmoid colon and                            in the transverse colon, removed with a cold snare.                            Resected  and retrieved.                           - Diverticulosis in the left colon.                           - Small internal hemorrhoids.                           - The examination was otherwise normal on direct                            and retroflexion views. Recommendation:           - Patient has a contact number available for                            emergencies. The signs and symptoms of potential                            delayed complications were discussed with the  patient. Return to normal activities tomorrow.                            Written discharge instructions were provided to the                            patient.                           - Resume previous diet.                           - Continue present medications. It is OK to resume                            your blood thinner today                           - Await pathology results.                           - Please start once daily OTC fiber supplement and                            call Dr. Ardis Hughs' office in 4-5 weeks to report on                            your response. Milus Banister, MD 08/10/2020 10:18:28 AM This report has been signed electronically.

## 2020-08-10 NOTE — Patient Instructions (Signed)
Await pathology  Continue your Xarelto today!!!  Start once daily over the counter fiber and call Dr. Eugenia Pancoast office in 4-5 weeks to report your response!  Please read over handouts about polyps, diverticulosis and hemorrhoids  YOU HAD AN ENDOSCOPIC PROCEDURE TODAY AT [place]:   Refer to the procedure report that was given to you for any specific questions about what was found during the examination.  If the procedure report does not answer your questions, please call your gastroenterologist to clarify.  If you requested that your care partner not be given the details of your procedure findings, then the procedure report has been included in a sealed envelope for you to review at your convenience later.  YOU SHOULD EXPECT: Some feelings of bloating in the abdomen. Passage of more gas than usual.  Walking can help get rid of the air that was put into your GI tract during the procedure and reduce the bloating. If you had a lower endoscopy (such as a colonoscopy or flexible sigmoidoscopy) you may notice spotting of blood in your stool or on the toilet paper. If you underwent a bowel prep for your procedure, you may not have a normal bowel movement for a few days.  Please Note:  You might notice some irritation and congestion in your nose or some drainage.  This is from the oxygen used during your procedure.  There is no need for concern and it should clear up in a day or so.  SYMPTOMS TO REPORT IMMEDIATELY:   Following lower endoscopy (colonoscopy or flexible sigmoidoscopy):  Excessive amounts of blood in the stool  Significant tenderness or worsening of abdominal pains  Swelling of the abdomen that is new, acute  Fever of 100F or higher  For urgent or emergent issues, a gastroenterologist can be reached at any hour by calling [phone]. Do not use MyChart messaging for urgent concerns.    DIET:  We do recommend a small meal at first, but then you may proceed to your  regular diet.  Drink plenty of fluids but you should avoid alcoholic beverages for 24 hours.  ACTIVITY:  You should plan to take it easy for the rest of today and you should NOT DRIVE or use heavy machinery until tomorrow (because of the sedation medicines used during the test).    FOLLOW UP: Our staff will call the number listed on your records 48-72 hours following your procedure to check on you and address any questions or concerns that you may have regarding the information given to you following your procedure. If we do not reach you, we will leave a message.  We will attempt to reach you two times.  During this call, we will ask if you have developed any symptoms of COVID 19. If you develop any symptoms (ie: fever, flu-like symptoms, shortness of breath, cough etc.) before then, please call [phone].  If you test positive for Covid 19 in the 2 weeks post procedure, please call and report this information to [place].    If any biopsies were taken you will be contacted by phone or by letter within the next 1-3 weeks.  Please call us at [phone] if you have not heard about the biopsies in 3 weeks.    SIGNATURES/CONFIDENTIALITY: You and/or your care partner have signed paperwork which will be entered into your electronic medical record.  These signatures attest to the fact that that the information above on your After Visit Summary has been reviewed  and is understood.  Full responsibility of the confidentiality of this discharge information lies with you and/or your care-partner.

## 2020-08-14 ENCOUNTER — Telehealth: Payer: Self-pay | Admitting: *Deleted

## 2020-08-14 NOTE — Telephone Encounter (Signed)
  Follow up Call-  Call back number 08/10/2020  Post procedure Call Back phone  # [phone]  Permission to leave phone message Yes  Some recent data might be hidden     Patient questions:  Do you have a fever, pain , or abdominal swelling? No. Pain Score  0 *  Have you tolerated food without any problems? Yes.    Have you been able to return to your normal activities? Yes.    Do you have any questions about your discharge instructions: Diet   No. Medications  No. Follow up visit  No.  Do you have questions or concerns about your Care? No.  Actions: * If pain score is 4 or above: No action needed, pain <4.  1. Have you developed a fever since your procedure? no  2.   Have you had an respiratory symptoms (SOB or cough) since your procedure? no  3.   Have you tested positive for COVID 19 since your procedure no  4.   Have you had any family members/close contacts diagnosed with the COVID 19 since your procedure?  no   If yes to any of these questions please route to Joylene John, RN and Joella Prince, RN

## 2020-08-22 DIAGNOSIS — C61 Malignant neoplasm of prostate: Secondary | ICD-10-CM | POA: Diagnosis not present

## 2020-08-23 ENCOUNTER — Encounter: Payer: Self-pay | Admitting: Gastroenterology

## 2020-08-29 DIAGNOSIS — R300 Dysuria: Secondary | ICD-10-CM | POA: Diagnosis not present

## 2020-08-29 DIAGNOSIS — R351 Nocturia: Secondary | ICD-10-CM | POA: Diagnosis not present

## 2020-08-29 DIAGNOSIS — N401 Enlarged prostate with lower urinary tract symptoms: Secondary | ICD-10-CM | POA: Diagnosis not present

## 2020-08-29 DIAGNOSIS — C61 Malignant neoplasm of prostate: Secondary | ICD-10-CM | POA: Diagnosis not present

## 2020-10-03 ENCOUNTER — Telehealth: Payer: Self-pay | Admitting: Pharmacist

## 2020-10-04 NOTE — Progress Notes (Signed)
    Chronic Care Management Pharmacy Assistant   Name: [name]  MRN: [id number] DOB: 03/03/1951   Reason for Encounter: Disease State   Conditions to be addressed/monitored: General   Recent office visits:  None ID  Recent consult visits:  08/03/20 Owens Loffler, MD Gastro (Constipation, unspecified constipation type) Rectal bleeding Medication Changes: Na Sulfate-K Sulfate-MG Sulf 17.5-3.13-1.6 GM/177ML 1 kit oral as directed Orders Placed: Ambulatory referral to [facility] visits:  None in previous 6 months  Medications: Outpatient Encounter Medications as of 10/03/2020  Medication Sig   albuterol (VENTOLIN HFA) 108 (90 Base) MCG/ACT inhaler INHALE TWO PUFFS into THE lungs EVERY 4 HOURS AS NEEDED FOR SHORTNESS OF BREATH OR wheezing   amiodarone (PACERONE) 200 MG tablet TAKE 1/2 TABLET BY MOUTH ONCE DAILY   atorvastatin (LIPITOR) 40 MG tablet TAKE ONE TABLET BY MOUTH DAILY   b complex vitamins capsule Take 1 capsule by mouth daily.   finasteride (PROSCAR) 5 MG tablet Take 5 mg by mouth daily.   folic acid (FOLVITE) 102 MCG tablet Take 400 mcg by mouth daily.   lisinopril (ZESTRIL) 10 MG tablet TAKE ONE TABLET BY MOUTH ONCE DAILY   metoprolol succinate (TOPROL-XL) 50 MG 24 hr tablet TAKE ONE TABLET BY MOUTH ONCE DAILY WITH FOOD OR immediately following   Multiple Vitamin (MULTIVITAMIN WITH MINERALS) TABS tablet Take 1 tablet by mouth daily.   Tiotropium Bromide-Olodaterol (STIOLTO RESPIMAT) 2.5-2.5 MCG/ACT AERS Inhale 2 puffs into the lungs daily.   XARELTO 20 MG TABS tablet TAKE ONE TABLET BY MOUTH ONCE DAILY (Patient taking differently: Pt takes in the pm - Managed by Dr. [name])   No facility-administered encounter medications on file as of 10/03/2020.   Pharmacist Review  Made 3 attempts to call patient for a general adherence call. Left message for patient to return call. Unable to reach patient.  Star Rating Drugs: Atorvastatin 40 mg 08/02/20 90  ds Lisinopril 10 mg 08/02/20 90 ds  Ethelene Hal Clinical Pharmacist Assistant [phone]   Time spent:25

## 2020-10-11 NOTE — Progress Notes (Signed)
Cardiology Office Note   Date:  10/15/2020   ID:  [name], DOB [birth year], MRN [id number]  PCP:  Dennis Koch, MD  Cardiologist:  Dr. [name], MD   Chief Complaint  Patient presents with   Follow-up   History of Present Illness: [name] is a 70 y.o. male who presents for follow up, seen for Dr. Marlou Cross.   [name] has a hx of paroxsymal atrial fibrillation with prior atrial flutter ablation in 2012/TEE/DCCV 2017 treated with amiodarone and Xarelto, CAD with [facility] 07/06/2010 showing CTO of the dRCA with collaterals with plans for medical management. Also with a hx of cardiomyopathy with and LVEF at initially at 25% with improvement on last echo to 60-65% in 2021, HLD, carotid artery disease, and tobacco use.   He was last seen by Dr. Marlou Cross 03/26/2020 at which time he was doing well. He was maintaining NSR.   Today he feels well with no specific complaints. He denies chest pain, palpitations, blood in stool or urine, LE edema, orthopnea, or syncope. Reports mild dizziness with standing. We discussed increasing lfuid hydration with plain water. BP on the softer side today. Still smoking. Follows with his PCP regularly.  Past Medical History:  Diagnosis Date   Adenomatous colon polyp    Alcohol use    Antiphospholipid antibody positive    ???In the past???but not proven according to patient   Antiphospholipid antibody positive    ??? in the past, but not proven according to the patient.   Atrial flutter ([place])    atrial flutter ablation,   Dr.Allred October 18, 2010   BPH (benign prostatic hypertrophy)    CAD (coronary artery disease)    Catheterization 2005 disease / catheterization July 06, 2010, chronic total occlusion distal RCA with collaterals from right and left side.  Medical therapy recommended, mild decreased LV function        Carotid artery disease (HCC)    [id number] R. ICA, [id number] LICA... Doppler.... January, 2010  /  Doppler... in January, 2011 no change   /  doppler1/27/2012...stable  [id number] RICA [id number] LICA   CHF (congestive heart failure) (HCC)    COPD (chronic obstructive pulmonary disease) (HCC)    CRAO (central retinal artery occlusion)    Ejection fraction     EF 50-55%, echo, March, 2012... inferobasal and distal septal hypokinesis /      60%, echo, November, 2008   GERD (gastroesophageal reflux disease)    Hypercholesterolemia    Hypertension    Myocardial infarction ([place])    Nonsustained ventricular tachycardia (HCC)    Palpitations    probable SVT, rate 170,, at home,, lasted one hour,, 2012   Prostate cancer ([place])    Raynaud's syndrome    Stroke ([place])    mild stroke per pt    TIA (transient ischemic attack)    Possible small vessel tia in the past.   Tobacco abuse     Past Surgical History:  Procedure Laterality Date   CARDIAC CATHETERIZATION  06/27/2003   2005.... mild irregularity of the LAD..( patient had had abnormal Myoview scan)   CARDIOVERSION N/A 03/06/2016   Procedure: CARDIOVERSION;  Surgeon: Dennis Hector, MD;  Location: [facility];  Service: Cardiovascular;  Laterality: N/A;   INGUINAL HERNIA REPAIR  2009   right with mesh.  Dr [name]   TEE WITHOUT CARDIOVERSION N/A 03/06/2016   Procedure: TRANSESOPHAGEAL ECHOCARDIOGRAM (TEE);  Surgeon: Dennis Hector, MD;  Location: [facility];  Service: Cardiovascular;  Laterality: N/A;   THYROGLOSSAL DUCT CYST  2006   Dr. Wilburn Cross   TRANSURETHRAL RESECTION OF PROSTATE N/A 05/15/2020   Procedure: TRANSURETHRAL RESECTION OF THE PROSTATE (TURP);  Surgeon: Dennis Seal, MD;  Location: [facility];  Service: Urology;  Laterality: N/A;     Current Outpatient Medications  Medication Sig Dispense Refill   albuterol (VENTOLIN HFA) 108 (90 Base) MCG/ACT inhaler INHALE TWO PUFFS into THE lungs EVERY 4 HOURS AS NEEDED FOR SHORTNESS OF BREATH OR wheezing 8.5 g 2   amiodarone (PACERONE) 100 MG tablet Take 100 mg by mouth daily. Take 1/2 tablet by mouth daily     atorvastatin (LIPITOR)  40 MG tablet TAKE ONE TABLET BY MOUTH DAILY 90 tablet 1   b complex vitamins capsule Take 1 capsule by mouth daily.     folic acid (FOLVITE) A999333 MCG tablet Take 400 mcg by mouth daily.     lisinopril (ZESTRIL) 10 MG tablet TAKE ONE TABLET BY MOUTH ONCE DAILY 90 tablet 1   metoprolol succinate (TOPROL XL) 25 MG 24 hr tablet Take 1 tablet (25 mg total) by mouth daily. 90 tablet 3   Multiple Vitamin (MULTIVITAMIN WITH MINERALS) TABS tablet Take 1 tablet by mouth daily. 30 tablet 0   Tiotropium Bromide-Olodaterol (STIOLTO RESPIMAT) 2.5-2.5 MCG/ACT AERS Inhale 2 puffs into the lungs daily. 12 g 11   XARELTO 20 MG TABS tablet TAKE ONE TABLET BY MOUTH ONCE DAILY 90 tablet 3   finasteride (PROSCAR) 5 MG tablet Take 5 mg by mouth daily. (Patient not taking: Reported on 10/15/2020)     No current facility-administered medications for this visit.    Allergies:   Cialis [tadalafil], Levitra [vardenafil], and Viagra [sildenafil citrate]    Social History:  The patient  reports that he has been smoking cigarettes. He started smoking about 52 years ago. He has a 25.00 pack-year smoking history. He has never used smokeless tobacco. He reports current alcohol use of about 14.0 - 21.0 standard drinks of alcohol per week. He reports that he does not use drugs.   Family History:  The patient's He was adopted. Family history is unknown by patient.    ROS:  Please see the history of present illness. Otherwise, review of systems are positive for none.   All other systems are reviewed and negative.    PHYSICAL EXAM: VS:  BP 112/62   Pulse 65   Ht '6\' 2"'$  (1.88 m)   Wt 164 lb (74.4 kg)   SpO2 99%   BMI 21.06 kg/m  , BMI Body mass index is 21.06 kg/m.  General: Well developed, well nourished, NAD Lungs:Clear to ausculation bilaterally. No wheezes, rales, or rhonchi. Breathing is unlabored. Cardiovascular: RRR with S1 S2. No murmurs Extremities: No edema. Neuro: Alert and oriented. No focal deficits. No  facial asymmetry. MAE spontaneously. Psych: Responds to questions appropriately with normal affect.     EKG:  EKG is ordered today. The ekg ordered today demonstrates NSR with IVCD HR 65bpm, no acute change from prior tracing.    Recent Labs: 03/26/2020: ALT 20; TSH 2.820 05/03/2020: BUN 19; Creatinine, Ser 1.09; Hemoglobin 16.7; Platelets 268; Potassium 4.9; Sodium 138    Lipid Panel    Component Value Date/Time   CHOL 124 09/22/2019 0903   TRIG 66 09/22/2019 0903   HDL 62 09/22/2019 0903   CHOLHDL 2.0 09/22/2019 0903   CHOLHDL 2 07/27/2017 0906   VLDL 12.2 07/27/2017 0906   LDLCALC 48 09/22/2019 0903  Wt Readings from Last 3 Encounters:  10/15/20 164 lb (74.4 kg)  08/10/20 168 lb (76.2 kg)  08/03/20 168 lb 12.8 oz (76.6 kg)    Other studies Reviewed: Additional studies/ records that were reviewed today include: . Review of the above records demonstrates:   CT scan of chest lung cancer screening 04/27/2019: - Personally reviewed and interpreted shows aortic atherosclerosis as well as coronary artery calcification   ECHO 2021:   1. Left ventricular ejection fraction, by estimation, is 60 to 65%. The  left ventricle has normal function. The left ventricle has no regional  wall motion abnormalities. Left ventricular diastolic parameters were  normal.   2. Right ventricular systolic function is normal. The right ventricular  size is normal.   3. The mitral valve is grossly normal. Trivial mitral valve  regurgitation.   4. The aortic valve is normal in structure. Aortic valve regurgitation is  not visualized. No aortic stenosis is present.  ASSESSMENT AND PLAN:  1. Paroxysmal atrial fibrillation: -Continuing on amiodarone -Update TSH, LFTs today -Receives q6M chest CT for lung cancer screen   -Continue with chronic anticoagulation with Xarelto -Maintaining NSR per EKG    2. CAD with known CTO of RCA with collaterals: -Stress test from 2015 with no significant  ischemia.   -Denies angina  -Last echo from 2021 with normal LVEF  -Chest CT with significant CAD -Continue risk factor mod with beta blocker and statin  -No ASA due to Xarelto   3. Ischemic/dilated cardiomyopathy: -Initial LVEF at 20% with improvement to 60-65% by 2021 however personally reviewed by Dr. Johnsie Cancel who felt this to be more in the 45% range a -Continue with lisinopril and Toprol   4. Hyperlipidemia: -Last LDL, 48 from 09/22/2019 -Continue with statin therapy -Obtain Lipid panel next PV>>ate this AM   5. Tobacco use: -Cessation strongly encouraged    6. Carotid artery disease: -Repeat dopplers 03/2020 with mild plaquing -No bruit -Continue plan as above     7. Orthostatic dizziness: -Reduce Toprol from '50mg'$  QD to '25mg'$  QD  -Stay hydrated with plain water -Change position slowly     Current medicines are reviewed at length with the patient today.  The patient does not have concerns regarding medicines.  The following changes have been made:  decrease Toprol to '25mg'$  QD  Labs/ tests ordered today include: TSH, LFTs  Orders Placed This Encounter  Procedures   TSH   Hepatic function panel   EKG 12-Lead    Disposition:   FU with Dr. Marlou Cross in 6 months  Signed, Kathyrn Drown, NP  10/15/2020 [address], [place], [place]  [address] Phone: [phone]; Fax: [phone]

## 2020-10-15 ENCOUNTER — Encounter: Payer: Self-pay | Admitting: Cardiology

## 2020-10-15 ENCOUNTER — Other Ambulatory Visit: Payer: Self-pay

## 2020-10-15 ENCOUNTER — Ambulatory Visit: Payer: Medicare HMO | Admitting: Cardiology

## 2020-10-15 VITALS — BP 112/62 | HR 65 | Ht 74.0 in | Wt 164.0 lb

## 2020-10-15 DIAGNOSIS — E782 Mixed hyperlipidemia: Secondary | ICD-10-CM

## 2020-10-15 DIAGNOSIS — Z79899 Other long term (current) drug therapy: Secondary | ICD-10-CM

## 2020-10-15 DIAGNOSIS — I6523 Occlusion and stenosis of bilateral carotid arteries: Secondary | ICD-10-CM | POA: Diagnosis not present

## 2020-10-15 DIAGNOSIS — I480 Paroxysmal atrial fibrillation: Secondary | ICD-10-CM

## 2020-10-15 DIAGNOSIS — I10 Essential (primary) hypertension: Secondary | ICD-10-CM | POA: Diagnosis not present

## 2020-10-15 DIAGNOSIS — I2510 Atherosclerotic heart disease of native coronary artery without angina pectoris: Secondary | ICD-10-CM | POA: Diagnosis not present

## 2020-10-15 DIAGNOSIS — I420 Dilated cardiomyopathy: Secondary | ICD-10-CM | POA: Diagnosis not present

## 2020-10-15 LAB — TSH: TSH: 3.03 u[IU]/mL (ref 0.450–4.500)

## 2020-10-15 LAB — HEPATIC FUNCTION PANEL
ALT: 17 IU/L (ref 0–44)
AST: 19 IU/L (ref 0–40)
Albumin: 4.8 g/dL (ref 3.8–4.8)
Alkaline Phosphatase: 87 IU/L (ref 44–121)
Bilirubin Total: 0.5 mg/dL (ref 0.0–1.2)
Bilirubin, Direct: 0.16 mg/dL (ref 0.00–0.40)
Total Protein: 7.1 g/dL (ref 6.0–8.5)

## 2020-10-15 MED ORDER — METOPROLOL SUCCINATE ER 25 MG PO TB24: 25.0000 mg | ORAL_TABLET | Freq: Every day | ORAL | 3 refills | Status: DC

## 2020-10-15 NOTE — Patient Instructions (Signed)
Medication Instructions:  Your physician has recommended you make the following change in your medication:  DECREASE TOPROL TO 25 MG DAILY.   *If you need a refill on your cardiac medications before your next appointment, please call your pharmacy*   Lab Work: TODAY: TSH, LFTS If you have labs (blood work) drawn today and your tests are completely normal, you will receive your results only by: [place] (if you have MyChart) OR A paper copy in the mail If you have any lab test that is abnormal or we need to change your treatment, we will call you to review the results.   Testing/Procedures: NONE   Follow-Up: At [facility], you and your health needs are our priority.  As part of our continuing mission to provide you with exceptional heart care, we have created designated Provider Care Teams.  These Care Teams include your primary Cardiologist (physician) and Advanced Practice Providers (APPs -  Physician Assistants and Nurse Practitioners) who all work together to provide you with the care you need, when you need it.  We recommend signing up for the patient portal called "MyChart".  Sign up information is provided on this After Visit Summary.  MyChart is used to connect with patients for Virtual Visits (Telemedicine).  Patients are able to view lab/test results, encounter notes, upcoming appointments, etc.  Non-urgent messages can be sent to your provider as well.   To learn more about what you can do with MyChart, go to [email or web address].    Your next appointment:   6 month(s)  The format for your next appointment:   In Person  Provider:   Candee Furbish, MD

## 2020-10-18 DIAGNOSIS — H5213 Myopia, bilateral: Secondary | ICD-10-CM | POA: Diagnosis not present

## 2020-10-30 ENCOUNTER — Telehealth: Payer: Self-pay | Admitting: Pharmacist

## 2020-10-30 NOTE — Progress Notes (Signed)
    Chronic Care Management Pharmacy Assistant   Name: [name]  MRN: [id number] DOB: 12/27/1950  Reason for Encounter: Medication [facility] visits:  Medication Reconciliation was completed by comparing discharge summary, patient's EMR and Pharmacy list, and upon discussion with patient.  05/15/20-05/16/20 [facility]  Medications: Outpatient Encounter Medications as of 10/30/2020  Medication Sig   albuterol (VENTOLIN HFA) 108 (90 Base) MCG/ACT inhaler INHALE TWO PUFFS into THE lungs EVERY 4 HOURS AS NEEDED FOR SHORTNESS OF BREATH OR wheezing   amiodarone (PACERONE) 100 MG tablet Take 100 mg by mouth daily. Take 1/2 tablet by mouth daily   atorvastatin (LIPITOR) 40 MG tablet TAKE ONE TABLET BY MOUTH DAILY   b complex vitamins capsule Take 1 capsule by mouth daily.   finasteride (PROSCAR) 5 MG tablet Take 5 mg by mouth daily. (Patient not taking: Reported on [id number])   folic acid (FOLVITE) A999333 MCG tablet Take 400 mcg by mouth daily.   lisinopril (ZESTRIL) 10 MG tablet TAKE ONE TABLET BY MOUTH ONCE DAILY   metoprolol succinate (TOPROL XL) 25 MG 24 hr tablet Take 1 tablet (25 mg total) by mouth daily.   Multiple Vitamin (MULTIVITAMIN WITH MINERALS) TABS tablet Take 1 tablet by mouth daily.   Tiotropium Bromide-Olodaterol (STIOLTO RESPIMAT) 2.5-2.5 MCG/ACT AERS Inhale 2 puffs into the lungs daily.   XARELTO 20 MG TABS tablet TAKE ONE TABLET BY MOUTH ONCE DAILY   No facility-administered encounter medications on file as of 10/30/2020.    Reviewed chart for medication changes ahead of medication coordination call.  No OVs, Consults, or hospital visits since last care coordination call/Pharmacist visit. (If appropriate, list visit date, provider name)  No medication changes indicated OR if recent visit, treatment plan here.  BP Readings from Last 3 Encounters:  10/15/20 112/62  08/10/20 108/60  08/03/20 (!) 146/72    No results found for: HGBA1C    Patient obtains medications through Vials  90 Days   Last adherence delivery included:  B Complex vitamins 1 cap daily Metoprolol succinate 50 mg 1 tab daily Amiodarone 200 mg 1/2 tab daily Lisinopril 10 mg 1 tab daily Atorvastatin 40 mg 1 tab daily   Patient is due for next adherence delivery on: 11/12/20.  Called patient and reviewed medications and coordinated delivery. This delivery to include: B Complex vitamins 1 cap daily Metoprolol succinate 50 mg 1 tab daily Amiodarone 200 mg 1/2 tab daily Lisinopril 10 mg 1 tab daily Atorvastatin 40 mg 1 tab daily  Patient was given a short fill of lisinopril prior to adherence delivery. (To align with sync date or if PRN med)  Patient needs refills for none noted.  3 attempts were made to reach patient to review medications being delivered. Left messages for patient to return call, unable to reach patient.  Star Rating Drugs: Atorvastatin 40 mg-last fill 08/03/20 90 ds Lisinopril 10 mg-last fill 08/03/20 90 ds   [place] Pharmacist Assistant [phone]   Time spent:28

## 2020-11-07 ENCOUNTER — Other Ambulatory Visit: Payer: Self-pay | Admitting: Internal Medicine

## 2020-11-07 ENCOUNTER — Other Ambulatory Visit: Payer: Self-pay | Admitting: Cardiology

## 2020-11-15 ENCOUNTER — Other Ambulatory Visit: Payer: Self-pay | Admitting: Internal Medicine

## 2020-11-15 DIAGNOSIS — J410 Simple chronic bronchitis: Secondary | ICD-10-CM

## 2020-11-27 ENCOUNTER — Other Ambulatory Visit: Payer: Self-pay

## 2020-11-27 ENCOUNTER — Ambulatory Visit (INDEPENDENT_AMBULATORY_CARE_PROVIDER_SITE_OTHER): Payer: Medicare HMO | Admitting: Pharmacist

## 2020-11-27 DIAGNOSIS — F17200 Nicotine dependence, unspecified, uncomplicated: Secondary | ICD-10-CM

## 2020-11-27 DIAGNOSIS — I2510 Atherosclerotic heart disease of native coronary artery without angina pectoris: Secondary | ICD-10-CM

## 2020-11-27 DIAGNOSIS — I5022 Chronic systolic (congestive) heart failure: Secondary | ICD-10-CM

## 2020-11-27 DIAGNOSIS — J410 Simple chronic bronchitis: Secondary | ICD-10-CM

## 2020-11-27 DIAGNOSIS — I480 Paroxysmal atrial fibrillation: Secondary | ICD-10-CM

## 2020-11-27 DIAGNOSIS — I10 Essential (primary) hypertension: Secondary | ICD-10-CM

## 2020-11-27 DIAGNOSIS — E782 Mixed hyperlipidemia: Secondary | ICD-10-CM

## 2020-11-27 NOTE — Patient Instructions (Signed)
Visit Information  Phone number for Pharmacist: [phone]   Goals Addressed             This Visit's Progress    Manage My Medicine       Timeframe:  Long-Range Goal Priority:  Medium Start Date:    11/27/20                         Expected End Date:     11/27/21                  Follow Up Date Sept 2023   - call for medicine refill 2 or 3 days before it runs out - call if I am sick and can't take my medicine - keep a list of all the medicines I take; vitamins and herbals too - use a pillbox to sort medicine    Why is this important?   These steps will help you keep on track with your medicines.   Notes:         Care Plan : CCM Pharmacy Care plan  Updates made by Charlton Haws, RPH since 11/27/2020 12:00 AM     Problem: Hypertension, Hyperlipidemia, Atrial Fibrillation, Coronary Artery Disease, COPD, and Tobacco use   Priority: High     Long-Range Goal: Disease management   Start Date: 11/27/2020  Expected End Date: 11/27/2021  This Visit's Progress: On track  Priority: High  Note:   Current Barriers:  Unable to independently monitor therapeutic efficacy  Pharmacist Clinical Goal(s):  Patient will achieve adherence to monitoring guidelines and medication adherence to achieve therapeutic efficacy through collaboration with PharmD and provider.   Interventions: 1:1 collaboration with Hoyt Koch, MD regarding development and update of comprehensive plan of care as evidenced by provider attestation and co-signature Inter-disciplinary care team collaboration (see longitudinal plan of care) Comprehensive medication review performed; medication list updated in electronic medical record  Hypertension / Heart Failure (BP goal <130/80) -Controlled - pt reports BP at home is "normal"; he reports dizziness has improved somewhat since reducing metoprolol dose -Current treatment: Lisinopril 10 mg daily Metoprolol succinate 25 mg daily -Educated on BP  goals and benefits of medications for prevention of heart attack, stroke and kidney damage; Importance of home blood pressure monitoring; -Counseled to monitor BP at home periodically -Recommended to continue current medication  Hyperlipidemia/ CAD (LDL goal < 70) -Controlled - most recent LDL was at goal, though over a year old; lipid panel has been stable for many years -Current treatment: Atorvastatin 40 mg daily -Educated on Cholesterol goals; Benefits of statin for ASCVD risk reduction; -Recommended to continue current medication  Atrial Fibrillation (Goal: prevent stroke and major bleeding) -Controlled - pt does not endorse symptoms; he reports compliance with Xarelto and rate control -CHADSVASC: 4 -Current treatment: Rate control: metoprolol succinate 50 mg, amiodarone 200 mg - 1/2 tab daily Anticoagulation: Xarelto 20 mg dialy -Counseled on increased risk of stroke due to Afib and benefits of anticoagulation for stroke prevention; avoidance of NSAIDs due to increased bleeding risk with anticoagulants; -Recommended to continue current medication  COPD (Goal: control symptoms and prevent exacerbations) -Controlled - pt does not endorse symptoms; he reports compliance with inhaler; pt is still smoking and is not ready to quit -Current treatment  Stiolto 2.5-2.5 mcg/act 2 puffs daily -Medications previously tried: Anoro  -Gold Grade: Gold 3 (FEV1 30-49%) -Current COPD Classification:  A (low sx, <2 exacerbations/yr) -Pulmonary function testing: FEV1 42% predicted,  FEV1/FVC 0.72 -Exacerbations requiring treatment in last 6 months: 0 -Patient reports consistent use of maintenance inhaler -Frequency of rescue inhaler use: rare -Counseled on Proper inhaler technique; Benefits of consistent maintenance inhaler use -Recommended to continue current medication  Patient Goals/Self-Care Activities - take medications as prescribed focus on medication adherence by pill box check blood  pressure periodically, document, and provide at future appointments       Patient verbalizes understanding of instructions provided today and agrees to view in [place].  Telephone follow up appointment with pharmacy team member scheduled for: 1 year  Charlene Brooke, PharmD, [place], CPP Clinical Pharmacist [facility] [phone]

## 2020-11-27 NOTE — Progress Notes (Signed)
Chronic Care Management Pharmacy Note  11/27/2020 Name:  [name] MRN:  [id number] DOB:  04-08-1950  Summary: -Pt reports compliance and denies issus; reports home BP is <140/90 -Pt is still smoking and not ready to quit  Recommendations/Changes made from today's visit: -No med changes -Advised to get Covid booster and Flu vaccine at local pharmacy   Subjective: [name] is an 70 y.o. year old male who is a primary patient of Hoyt Koch, MD.  The CCM team was consulted for assistance with disease management and care coordination needs.    Engaged with patient by telephone for follow up visit in response to provider referral for pharmacy case management and/or care coordination services.   Consent to Services:  The patient was given information about Chronic Care Management services, agreed to services, and gave verbal consent prior to initiation of services.  Please see initial visit note for detailed documentation.   Patient Care Team: Hoyt Koch, MD as PCP - General (Internal Medicine) Jerline Pain, MD as PCP - Cardiology (Cardiology) Alexander Mt (Inactive) as Physician Assistant (Neurology) Michael Boston, MD as Attending Physician (General Surgery) Jerline Pain, MD as Consulting Physician (Cardiology) Charlton Haws, [facility] as Pharmacist (Pharmacist)  Recent office visits: 06/21/20 Dr Sharlet Salina OV: Annual exam. Referred for colonoscopy. Advised smoking cessation.  04/07/19 Dr Sharlet Salina VV: cough/SOB, rx'd prednisone and doxycyline for COPD exacerbation. Referred for lung cancer screening  Recent consult visits: 10/15/20 NP McDaneil (cardiology): f/u Afib, CAD; reduce metoprolol to 25 mg due to dizziness.  08/29/20 Dr Jeffie Pollock (urology): f/u prostate cancer  08/10/20 colonoscopy - 1 pre-cancerous polyp removed. 08/03/20 Dr Ardis Hughs (GI): c/o blood in stool 2x a month. Ordered colonoscopy  03/26/20 Dr [name] (cardiology): f/u Afib, HF, CAD. No  med changes. Repeat carotid dopplers.  02/06/20 Dr Jeffie Pollock (urology): f/u for prostate cancer, BPH.  11/30/19 NP Matt Hunsucker (pulmonary): COPD, no med changes. Smoking cessation counseling - precontemplative.  Recent Hospital Visits: Medication Reconciliation was completed by comparing discharge summary, patient's EMR and Pharmacy list, and upon discussion with patient.  Admitted to the hospital on 05/15/20 due to TURP procedure. Discharge date was 05/16/20. Discharged from [facility].    Medications that remain the same after Hospital Discharge:??  -All other medications will remain the same.     Objective:  Lab Results  Component Value Date   CREATININE 1.09 05/03/2020   BUN 19 05/03/2020   GFR 67.62 07/27/2017   GFRNONAA >60 05/03/2020   GFRAA 69 03/26/2020   NA 138 05/03/2020   K 4.9 05/03/2020   CALCIUM 10.0 05/03/2020   CO2 29 05/03/2020    Lab Results  Component Value Date/Time   GFR 67.62 07/27/2017 09:06 AM   GFR 81.94 03/06/2015 08:31 AM    Last diabetic Eye exam: No results found for: HMDIABEYEEXA  Last diabetic Foot exam: No results found for: HMDIABFOOTEX   Lab Results  Component Value Date   CHOL 124 09/22/2019   HDL 62 09/22/2019   LDLCALC 48 09/22/2019   TRIG 66 09/22/2019   CHOLHDL 2.0 09/22/2019    Hepatic Function Latest Ref Rng & Units 10/15/2020 03/26/2020 09/22/2019  Total Protein 6.0 - 8.5 g/dL 7.1 7.1 6.9  Albumin 3.8 - 4.8 g/dL 4.8 4.8 4.5  AST 0 - 40 IU/L 19 23 19   ALT 0 - 44 IU/L 17 20 15   Alk Phosphatase 44 - 121 IU/L 87 84 88  Total Bilirubin 0.0 - 1.2  mg/dL 0.5 0.4 0.5  Bilirubin, Direct 0.00 - 0.40 mg/dL 0.16 - 0.19    Lab Results  Component Value Date/Time   TSH 3.030 10/15/2020 09:21 AM   TSH 2.820 03/26/2020 10:15 AM   FREET4 1.46 08/18/2018 10:25 AM   FREET4 1.74 01/09/2017 08:40 AM    CBC Latest Ref Rng & Units 05/03/2020 03/26/2020 09/22/2019  WBC 4.0 - 10.5 K/uL 9.2 10.8 8.0  Hemoglobin 13.0 - 17.0 g/dL 16.7 15.8  15.3  Hematocrit 39.0 - 52.0 % 48.9 44.9 45.3  Platelets 150 - 400 K/uL 268 258 236    Lab Results  Component Value Date/Time   VD25OH 59 10/02/2011 09:41 AM    Clinical ASCVD: Yes  The ASCVD Risk score (Arnett DK, et al., 2019) failed to calculate for the following reasons:   The valid total cholesterol range is 130 to 320 mg/dL    CHA2DS2-VASc Score = 6  The patient's score is based upon: CHF History: 1 HTN History: 1 Diabetes History: 0 Stroke History: 2 Vascular Disease History: 1 Age Score: 1 Gender Score: 0  BP Readings from Last 3 Encounters:  10/15/20 112/62  08/10/20 108/60  08/03/20 (!) 146/72   Pulse Readings from Last 3 Encounters:  10/15/20 65  08/10/20 76  08/03/20 (!) 51   Wt Readings from Last 3 Encounters:  10/15/20 164 lb (74.4 kg)  08/10/20 168 lb (76.2 kg)  08/03/20 168 lb 12.8 oz (76.6 kg)   BMI Readings from Last 3 Encounters:  10/15/20 21.06 kg/m  08/10/20 21.57 kg/m  08/03/20 21.67 kg/m    Assessment/Interventions: Review of patient past medical history, allergies, medications, health status, including review of consultants reports, laboratory and other test data, was performed as part of comprehensive evaluation and provision of chronic care management services.   SDOH:  (Social Determinants of Health) assessments and interventions performed:   SDOH Screenings   Alcohol Screen: Not on file  Depression (PHQ2-9): Low Risk    PHQ-2 Score: 0  Financial Resource Strain: Not on file  Food Insecurity: Not on file  Housing: Not on file  Physical Activity: Not on file  Social Connections: Not on file  Stress: Not on file  Tobacco Use: High Risk   Smoking Tobacco Use: Every Day   Smokeless Tobacco Use: Never  Transportation Needs: Not on file     [place]  Allergies  Allergen Reactions   Cialis [Tadalafil] Other (See Comments)    History of stroke    Levitra [Vardenafil] Other (See Comments)    History of stroke     Viagra [Sildenafil Citrate] Other (See Comments)    History of stroke     Medications Reviewed Today     Reviewed by Charlton Haws, [facility] (Pharmacist) on 11/27/20 at 1118  Med List Status: <None>   Medication Order Taking? Sig Documenting Provider Last Dose Status Informant  albuterol (VENTOLIN HFA) 108 (90 Base) MCG/ACT inhaler [id number] Yes INHALE TWO PUFFS into THE lungs EVERY 4 HOURS AS NEEDED FOR SHORTNESS OF BREATH OR wheezing Hoyt Koch, MD Taking Active   amiodarone (PACERONE) 200 MG tablet [id number] Yes TAKE 1/2 TABLET BY MOUTH ONCE DAILY Jerline Pain, MD Taking Active   atorvastatin (LIPITOR) 40 MG tablet [id number] Yes TAKE ONE TABLET BY MOUTH DAILY Hoyt Koch, MD Taking Active   b complex vitamins capsule [id number] Yes Take 1 capsule by mouth daily. [provider] Taking Active Spouse/Significant Other  finasteride (PROSCAR) 5 MG tablet [id number]  Yes Take 5 mg by mouth daily. [provider] Taking Active   folic acid (FOLVITE) 182 MCG tablet [id number] Yes Take 400 mcg by mouth daily. [provider] Taking Active Spouse/Significant Other  lisinopril (ZESTRIL) 10 MG tablet [id number] Yes TAKE ONE TABLET BY MOUTH DAILY Hoyt Koch, MD Taking Active   metoprolol succinate (TOPROL XL) 25 MG 24 hr tablet [id number] Yes Take 1 tablet (25 mg total) by mouth daily. Tommie Raymond, NP Taking Active   Multiple Vitamin (MULTIVITAMIN WITH MINERALS) TABS tablet [id number] Yes Take 1 tablet by mouth daily. Cristal Ford, DO Taking Active Spouse/Significant Other  Tiotropium Bromide-Olodaterol (STIOLTO RESPIMAT) 2.5-2.5 MCG/ACT AERS [id number] Yes Inhale 2 puffs into the lungs daily. 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 02/03/2018   Centrilobular emphysema ([place]) 05/06/2016   Routine general medical examination at a health care facility 02/28/2014   Paroxysmal atrial fibrillation ([place]) 12/05/2013   Nonsustained ventricular tachycardia ([place])    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[place]) 04/22/2013   CAD (coronary artery disease)    Raynaud's syndrome    CRAO (central retinal artery occlusion)    Carotid artery disease ([place])    THYROID CYST 07/05/2007   Hyperlipidemia 07/05/2007   Alcohol abuse 07/05/2007   Essential hypertension 07/05/2007   GERD 07/05/2007    Immunization History  Administered Date(s) Administered   Fluad Quad(high Dose 65+) 01/27/2019   Influenza, High Dose Seasonal PF 01/13/2018   PFIZER(Purple Top)SARS-COV-2 Vaccination 04/23/2019, 05/18/2019, 12/18/2019   Pneumococcal Conjugate-13 05/06/2016   Pneumococcal Polysaccharide-23 07/27/2017   Tdap 02/27/2014    Conditions to be addressed/monitored:  Hypertension, Hyperlipidemia, Atrial Fibrillation, Coronary Artery Disease, COPD, and Tobacco use   Patient Care Plan: CCM Pharmacy Care plan     Problem Identified: Hypertension, Hyperlipidemia, Atrial Fibrillation, Coronary Artery Disease, COPD, and Tobacco use   Priority: High     Long-Range Goal: Disease management   Start Date: 11/27/2020  Expected End Date: 11/27/2021  This Visit's Progress: On track  Priority: High  Note:   Current Barriers:  Unable to independently monitor therapeutic efficacy  Pharmacist Clinical Goal(s):  Patient will achieve adherence to monitoring guidelines and medication adherence to achieve therapeutic efficacy through collaboration with PharmD and provider.   Interventions: 1:1 collaboration with Hoyt Koch, MD regarding development and update of comprehensive plan of care as evidenced by provider  attestation and co-signature Inter-disciplinary care team collaboration (see longitudinal plan of care) Comprehensive medication review performed; medication list updated in electronic medical record  Hypertension / Heart Failure (BP goal <130/80) -Controlled - pt reports BP at home is "normal"; he reports dizziness has improved somewhat since reducing metoprolol dose -Current treatment: Lisinopril 10 mg daily Metoprolol succinate 25 mg daily -Educated on BP goals and benefits of medications for prevention of heart attack, stroke and kidney damage; Importance of home blood pressure monitoring; -Counseled to monitor BP at home periodically -Recommended to continue current medication  Hyperlipidemia/ CAD (LDL goal < 70) -Controlled - most recent LDL was at goal, though over a year old; lipid panel has been stable for many years -  Current treatment: Atorvastatin 40 mg daily -Educated on Cholesterol goals; Benefits of statin for ASCVD risk reduction; -Recommended to continue current medication  Atrial Fibrillation (Goal: prevent stroke and major bleeding) -Controlled - pt does not endorse symptoms; he reports compliance with Xarelto and rate control -CHADSVASC: 4 -Current treatment: Rate control: metoprolol succinate 50 mg, amiodarone 200 mg - 1/2 tab daily Anticoagulation: Xarelto 20 mg dialy -Counseled on increased risk of stroke due to Afib and benefits of anticoagulation for stroke prevention; avoidance of NSAIDs due to increased bleeding risk with anticoagulants; -Recommended to continue current medication  COPD (Goal: control symptoms and prevent exacerbations) -Controlled - pt does not endorse symptoms; he reports compliance with inhaler; pt is still smoking and is not ready to quit -Current treatment  Stiolto 2.5-2.5 mcg/act 2 puffs daily -Medications previously tried: Anoro  -Gold Grade: Gold 3 (FEV1 30-49%) -Current COPD Classification:  A (low sx, <2  exacerbations/yr) -Pulmonary function testing: FEV1 42% predicted, FEV1/FVC 0.72 -Exacerbations requiring treatment in last 6 months: 0 -Patient reports consistent use of maintenance inhaler -Frequency of rescue inhaler use: rare -Counseled on Proper inhaler technique; Benefits of consistent maintenance inhaler use -Recommended to continue current medication  Patient Goals/Self-Care Activities - take medications as prescribed focus on medication adherence by pill box check blood pressure periodically, document, and provide at future appointments      Compliance/Adherence/Medication fill history: Care Gaps: Vaccines - Shingrix, Covid booster, influenza --advised to get vaccines at a local pharmacy  Star-Rating Drugs: Atorvastatin - LF 11/08/20 x 90 ds Lisinopril - LF 11/08/20 x 90 ds  Medication Assistance: None required.  Patient affirms current coverage meets needs.  Patient's preferred pharmacy is:  [facility] - [place], [place] - [address] [place] [address] Phone: [phone] Fax: [phone]  Uses pill box? Yes Pt endorses 100% compliance  We discussed: Current pharmacy is preferred with insurance plan and patient is satisfied with pharmacy services  Patient decided to: Utilize [facility] for medication synchronization, packaging and delivery   Follow Up:  Patient agrees to Care Plan and Follow-up.  Plan: Telephone follow up appointment with care management team member scheduled for:  1 year  Charlene Brooke, PharmD, [place], CPP Clinical Pharmacist [facility] [phone]

## 2020-12-14 DIAGNOSIS — J410 Simple chronic bronchitis: Secondary | ICD-10-CM

## 2020-12-14 DIAGNOSIS — I10 Essential (primary) hypertension: Secondary | ICD-10-CM

## 2020-12-14 DIAGNOSIS — I480 Paroxysmal atrial fibrillation: Secondary | ICD-10-CM | POA: Diagnosis not present

## 2020-12-14 DIAGNOSIS — I2510 Atherosclerotic heart disease of native coronary artery without angina pectoris: Secondary | ICD-10-CM | POA: Diagnosis not present

## 2020-12-14 DIAGNOSIS — E782 Mixed hyperlipidemia: Secondary | ICD-10-CM

## 2020-12-14 DIAGNOSIS — I5022 Chronic systolic (congestive) heart failure: Secondary | ICD-10-CM

## 2020-12-27 ENCOUNTER — Telehealth: Payer: Self-pay | Admitting: Pharmacist

## 2020-12-27 NOTE — Progress Notes (Signed)
    Chronic Care Management Pharmacy Assistant   Name: [name]  MRN: [id number] DOB: 03-31-1950   Reason for Encounter: Medication Review    Recent office visits:  None ID  Recent consult visits:  None ID  Hospital visits:  None in previous 6 months  Medications: Outpatient Encounter Medications as of 12/27/2020  Medication Sig   albuterol (VENTOLIN HFA) 108 (90 Base) MCG/ACT inhaler INHALE TWO PUFFS into THE lungs EVERY 4 HOURS AS NEEDED FOR SHORTNESS OF BREATH OR wheezing   amiodarone (PACERONE) 200 MG tablet TAKE 1/2 TABLET BY MOUTH ONCE DAILY   atorvastatin (LIPITOR) 40 MG tablet TAKE ONE TABLET BY MOUTH DAILY   b complex vitamins capsule Take 1 capsule by mouth daily.   finasteride (PROSCAR) 5 MG tablet Take 5 mg by mouth daily.   folic acid (FOLVITE) 149 MCG tablet Take 400 mcg by mouth daily.   lisinopril (ZESTRIL) 10 MG tablet TAKE ONE TABLET BY MOUTH DAILY   metoprolol succinate (TOPROL XL) 25 MG 24 hr tablet Take 1 tablet (25 mg total) by mouth daily.   Multiple Vitamin (MULTIVITAMIN WITH MINERALS) TABS tablet Take 1 tablet by mouth daily.   Tiotropium Bromide-Olodaterol (STIOLTO RESPIMAT) 2.5-2.5 MCG/ACT AERS Inhale 2 puffs into the lungs daily.   XARELTO 20 MG TABS tablet TAKE ONE TABLET BY MOUTH ONCE DAILY   No facility-administered encounter medications on file as of 12/27/2020.    Reviewed chart for medication changes ahead of medication coordination call.  No OVs, Consults, or hospital visits since last care coordination call/Pharmacist visit. (If appropriate, list visit date, provider name)  No medication changes indicated OR if recent visit, treatment plan here.  BP Readings from Last 3 Encounters:  10/15/20 112/62  08/10/20 108/60  08/03/20 (!) 146/72    No results found for: HGBA1C   Reviewed chart for medication changes ahead of medication coordination call.    Patient obtains medications through Vials  90 Days   Patient is due for next  adherence delivery on: 01/08/21  Attempted to contact patient x 2 for medication review and delivery, unable to reach patient, left voicemails to return call.   This delivery to include: Stiolto 2.5-2.5 mcg 2 puffs daily Albuterol 90 cg 2 puffs every 4 hours as needed Xarelto 20 mg 1 tab daily  Patient needs refills for none.  Star Rating Drugs: Lisinopril 10 mg-last fill  11/11/20 90 ds Atorvastatin 40 mg-last fill 11/10/20 90 ds  Ethelene Hal Clinical Pharmacist Assistant [phone]

## 2021-01-24 ENCOUNTER — Telehealth: Payer: Self-pay | Admitting: Pharmacist

## 2021-01-24 NOTE — Progress Notes (Signed)
    Chronic Care Management Pharmacy Assistant   Name: [name]  MRN: [id number] DOB: July 04, 1950   Reason for Encounter: Medication Review    Recent office visits:  None ID  Recent consult visits:  None ID  Hospital visits:  None in previous 6 months  Medications: Outpatient Encounter Medications as of 01/24/2021  Medication Sig   albuterol (VENTOLIN HFA) 108 (90 Base) MCG/ACT inhaler INHALE TWO PUFFS into THE lungs EVERY 4 HOURS AS NEEDED FOR SHORTNESS OF BREATH OR wheezing   amiodarone (PACERONE) 200 MG tablet TAKE 1/2 TABLET BY MOUTH ONCE DAILY   atorvastatin (LIPITOR) 40 MG tablet TAKE ONE TABLET BY MOUTH DAILY   b complex vitamins capsule Take 1 capsule by mouth daily.   finasteride (PROSCAR) 5 MG tablet Take 5 mg by mouth daily.   folic acid (FOLVITE) 092 MCG tablet Take 400 mcg by mouth daily.   lisinopril (ZESTRIL) 10 MG tablet TAKE ONE TABLET BY MOUTH DAILY   metoprolol succinate (TOPROL XL) 25 MG 24 hr tablet Take 1 tablet (25 mg total) by mouth daily.   Multiple Vitamin (MULTIVITAMIN WITH MINERALS) TABS tablet Take 1 tablet by mouth daily.   Tiotropium Bromide-Olodaterol (STIOLTO RESPIMAT) 2.5-2.5 MCG/ACT AERS Inhale 2 puffs into the lungs daily.   XARELTO 20 MG TABS tablet TAKE ONE TABLET BY MOUTH ONCE DAILY   No facility-administered encounter medications on file as of 01/24/2021.    BP Readings from Last 3 Encounters:  10/15/20 112/62  08/10/20 108/60  08/03/20 (!) 146/72    No results found for: HGBA1C    Last adherence delivery date:01/01/21      Patient is due for next adherence delivery on: 02/06/21  Multiple attempts made to reach patient. Unsuccessful outreach. Will refill based off of last adherence fill.   This delivery to include: Vials  90 Days  Stiolto 2.5-2.5 mcg 2 puffs daily (30 ds) Albuterol 90 cg 2 puffs every 4 hours as needed (30 ds)  Xarelto 20 mg 1 tab daily (30 ds) Amiodarone 200 mg  tab daily Atorvastatin 40 mg 1 tab  daily Lisinopril 10 mg 1 tab daily B-complex with c use as directed Metoprolol 25 mg 1 tab daily    Refills requested from providers include:atorvastatin-cpp to request  Delivery scheduled for 02/06/21. Unable to speak with patient to confirm date.    Annual wellness visit in last year? Yes Most Recent BP reading:112/62-10/15/20   [place] Pharmacist Assistant [phone]

## 2021-01-29 ENCOUNTER — Other Ambulatory Visit: Payer: Self-pay | Admitting: Internal Medicine

## 2021-02-18 DIAGNOSIS — C61 Malignant neoplasm of prostate: Secondary | ICD-10-CM | POA: Diagnosis not present

## 2021-02-25 ENCOUNTER — Ambulatory Visit: Payer: Medicare HMO | Admitting: Internal Medicine

## 2021-02-25 DIAGNOSIS — R972 Elevated prostate specific antigen [PSA]: Secondary | ICD-10-CM | POA: Diagnosis not present

## 2021-02-25 DIAGNOSIS — R351 Nocturia: Secondary | ICD-10-CM | POA: Diagnosis not present

## 2021-02-25 DIAGNOSIS — C61 Malignant neoplasm of prostate: Secondary | ICD-10-CM | POA: Diagnosis not present

## 2021-02-25 DIAGNOSIS — N401 Enlarged prostate with lower urinary tract symptoms: Secondary | ICD-10-CM | POA: Diagnosis not present

## 2021-02-26 ENCOUNTER — Encounter: Payer: Self-pay | Admitting: Emergency Medicine

## 2021-02-26 ENCOUNTER — Other Ambulatory Visit: Payer: Self-pay

## 2021-02-26 ENCOUNTER — Ambulatory Visit (INDEPENDENT_AMBULATORY_CARE_PROVIDER_SITE_OTHER): Payer: Medicare HMO | Admitting: Emergency Medicine

## 2021-02-26 VITALS — BP 128/60 | HR 50 | Temp 98.0°F | Ht 74.0 in | Wt 169.0 lb

## 2021-02-26 DIAGNOSIS — L723 Sebaceous cyst: Secondary | ICD-10-CM

## 2021-02-26 MED ORDER — MUPIROCIN CALCIUM 2 % EX CREA: 1.0000 "application " | TOPICAL_CREAM | Freq: Two times a day (BID) | CUTANEOUS | 0 refills | Status: DC

## 2021-02-26 NOTE — Patient Instructions (Signed)
Epidermoid Cyst An epidermoid cyst, also called an epidermal cyst, is a small lump under your skin. The cyst contains a substance called keratin. Do not try to pop or open the cyst yourself. What are the causes? A blocked hair follicle. A hair that curls and re-enters the skin instead of growing straight out of the skin. A blocked pore. Irritated skin. An injury to the skin. Certain conditions that are passed along from parent to child. Human papillomavirus (HPV). This happens rarely when cysts occur on the bottom of the feet. Long-term sun damage to the skin. What increases the risk? Having acne. Being male. Having an injury to the skin. Being past puberty. Having certain conditions caused by genes (genetic disorder) What are the signs or symptoms? These cysts are usually harmless, but they can get infected. Symptoms of infection may include: Redness. Inflammation. Tenderness. Warmth. Fever. A bad-smelling substance that drains from the cyst. Pus that drains from the cyst. How is this treated? In many cases, epidermoid cysts go away on their own without treatment. If a cyst becomes infected, treatment may include: Opening and draining the cyst, done by a doctor. After draining, you may need minor surgery to remove the rest of the cyst. Antibiotic medicine. Shots of medicines (steroids) that help to reduce inflammation. Surgery to remove the cyst. Surgery may be done if the cyst: Becomes large. Bothers you. Has a chance of turning into cancer. Do not try to open a cyst yourself. Follow these instructions at home: Medicines Take over-the-counter and prescription medicines as told by your doctor. If you were prescribed an antibiotic medicine, take it as told by your doctor. Do not stop taking it even if you start to feel better. General instructions Keep the area around your cyst clean and dry. Wear loose, dry clothing. Avoid touching your cyst. Check your cyst every day for  signs of infection. Check for: Redness, swelling, or pain. Fluid or blood. Warmth. Pus or a bad smell. Keep all follow-up visits. How is this prevented? Wear clean, dry, clothing. Avoid wearing tight clothing. Keep your skin clean and dry. Take showers or baths every day. Contact a doctor if: Your cyst has symptoms of infection. Your condition does not improve or gets worse. You have a cyst that looks different from other cysts you have had. You have a fever. Get help right away if: Redness spreads from the cyst into the area close by. Summary An epidermoid cyst is a small lump under your skin. If a cyst becomes infected, treatment may include surgery to open and drain the cyst, or to remove it. Take over-the-counter and prescription medicines only as told by your doctor. Contact a doctor if your condition is not improving or is getting worse. Keep all follow-up visits. This information is not intended to replace advice given to you by your health care provider. Make sure you discuss any questions you have with your health care provider. Document Revised: 06/08/2019 Document Reviewed: 06/08/2019 Elsevier Patient Education  [place].

## 2021-02-26 NOTE — Progress Notes (Signed)
[name] 70 y.o.   Chief Complaint  Patient presents with   Office Visit    Infected ingrown hair on chest    HISTORY OF PRESENT ILLNESS: Acute problem visit. This is a 70 y.o. male complaining of infected cyst to his chest. Started couple weeks ago.  No other complaints or medical concerns On Xarelto but stopped it 2 to 3 days ago.  HPI   Prior to Admission medications   Medication Sig Start Date End Date Taking? Authorizing Provider  albuterol (VENTOLIN HFA) 108 (90 Base) MCG/ACT inhaler INHALE TWO PUFFS into THE lungs EVERY 4 HOURS AS NEEDED FOR SHORTNESS OF BREATH OR wheezing 11/15/20  Yes Hoyt Koch, MD  amiodarone (PACERONE) 200 MG tablet TAKE 1/2 TABLET BY MOUTH ONCE DAILY 11/07/20  Yes Jerline Pain, MD  atorvastatin (LIPITOR) 40 MG tablet TAKE ONE TABLET BY MOUTH DAILY 01/29/21  Yes Hoyt Koch, MD  b complex vitamins capsule Take 1 capsule by mouth daily.   Yes [provider]  finasteride (PROSCAR) 5 MG tablet Take 5 mg by mouth daily.   Yes [provider]  folic acid (FOLVITE) 409 MCG tablet Take 400 mcg by mouth daily.   Yes [provider]  lisinopril (ZESTRIL) 10 MG tablet TAKE ONE TABLET BY MOUTH DAILY 11/07/20  Yes Hoyt Koch, MD  metoprolol succinate (TOPROL XL) 25 MG 24 hr tablet Take 1 tablet (25 mg total) by mouth daily. 10/15/20  Yes Kathyrn Drown D, NP  Multiple Vitamin (MULTIVITAMIN WITH MINERALS) TABS tablet Take 1 tablet by mouth daily. 03/08/16  Yes Mikhail, Velta Addison, DO  Tiotropium Bromide-Olodaterol (STIOLTO RESPIMAT) 2.5-2.5 MCG/ACT AERS Inhale 2 puffs into the lungs daily. 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 02/03/2018   Centrilobular emphysema ([place]) 05/06/2016   Routine general medical examination at a health care facility 02/28/2014   Paroxysmal atrial fibrillation ([place]) 12/05/2013   Nonsustained ventricular tachycardia    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 in the past, but not proven according to the patient.   Atrial flutter ([place])    atrial flutter ablation,   Dr.Allred October 18, 2010   BPH (benign prostatic hypertrophy)    CAD (coronary artery disease)    Catheterization 2005 disease / catheterization July 06, 2010, chronic total occlusion  distal RCA with collaterals from right and left side.  Medical therapy recommended, mild decreased LV function        Carotid artery disease ([facility])    4-09% R. ICA, 81-19% LICA... Doppler.... January, 2010  /  Doppler... in January, 2011 no change  /  doppler1/27/2012...stable  1-47% RICA 82-95% LICA   CHF (congestive heart failure) (HCC)    COPD (chronic obstructive pulmonary disease) (HCC)    CRAO (central retinal artery occlusion)    Ejection fraction     EF 50-55%, echo, March, 2012... inferobasal and distal septal hypokinesis /      60%, echo, November, 2008   GERD  (gastroesophageal reflux disease)    Hypercholesterolemia    Hypertension    Myocardial infarction ([place])    Nonsustained ventricular tachycardia    Palpitations    probable SVT, rate 170,, at home,, lasted one hour,, 2012   Prostate cancer ([place])    Raynaud's syndrome    Stroke ([place])    mild stroke per pt    TIA (transient ischemic attack)    Possible small vessel tia in the past.   Tobacco abuse     Past Surgical History:  Procedure Laterality Date   CARDIAC CATHETERIZATION  06/27/2003   2005.... mild irregularity of the LAD..( patient had had abnormal Myoview scan)   CARDIOVERSION N/A 03/06/2016   Procedure: CARDIOVERSION;  Surgeon: Josue Hector, MD;  Location: [facility];  Service: Cardiovascular;  Laterality: N/A;   INGUINAL HERNIA REPAIR  2009   right with mesh.  Dr Marlou Starks   TEE WITHOUT CARDIOVERSION N/A 03/06/2016   Procedure: TRANSESOPHAGEAL ECHOCARDIOGRAM (TEE);  Surgeon: Josue Hector, MD;  Location: [facility];  Service: Cardiovascular;  Laterality: N/A;   THYROGLOSSAL DUCT CYST  2006   Dr. Wilburn Cornelia   TRANSURETHRAL RESECTION OF PROSTATE N/A 05/15/2020   Procedure: TRANSURETHRAL RESECTION OF THE PROSTATE (TURP);  Surgeon: Irine Seal, MD;  Location: [facility];  Service: Urology;  Laterality: N/A;    Social History   Socioeconomic History   Marital status: Married    Spouse name: Not on file   Number of children: Not on file   Years of education: Not on file   Highest education level: Not on file  Occupational History   Occupation: land Surveyor/ retired  Tobacco Use   Smoking status: Every Day    Packs/day: 0.50    Years: 50.00    Pack years: 25.00    Types: Cigarettes    Start date: 1970   Smokeless tobacco: Never   Tobacco comments:    11/30/19 .5-1pack per day  Vaping Use   Vaping Use: Never used  Substance and Sexual Activity   Alcohol use: Yes    Alcohol/week: 14.0 - 21.0 standard drinks    Types: 14 - 21 Cans of beer per week   Drug use: No    Sexual activity: Not Currently  Other Topics Concern   Not on file  Social History Narrative   Not on file   Social Determinants of Health   Financial Resource Strain: Not on file  Food Insecurity: Not on file  Transportation Needs: Not on file  Physical Activity: Not on file  Stress: Not on file  Social Connections: Not on file  Intimate Partner Violence: Not on file    Family History  Adopted: Yes  Family history unknown: Yes     Review of Systems  Constitutional: Negative.  Negative for chills and fever.  HENT: Negative.  Negative for congestion and sore throat.   Respiratory: Negative.  Negative for cough and shortness of breath.   Cardiovascular: Negative.  Negative for chest pain.  Gastrointestinal:  Negative for nausea and vomiting.  Genitourinary: Negative.   Skin:        Infected cyst on chest  Neurological: Negative.  Negative for dizziness and headaches.  All other systems reviewed and are negative.  Today's Vitals   02/26/21 1337  BP: 128/60  Pulse: (!) 50  Temp: 98 F (36.7 C)  TempSrc: Oral  SpO2: 100%  Weight: 169 lb (76.7 kg)  Height: 6\' 2"  (1.88 m)   Body mass index is 21.7 kg/m.  Physical Exam Constitutional:      Appearance: Normal appearance.  HENT:     Head: Normocephalic.  Eyes:     Extraocular Movements: Extraocular movements intact.     Pupils: Pupils are equal, round, and reactive to light.  Cardiovascular:     Rate and Rhythm: Normal rate.  Pulmonary:     Effort: Pulmonary effort is normal.  Musculoskeletal:     Cervical back: No tenderness.  Skin:    General: Skin is warm and dry.     Findings: Lesion (See picture below) present.  Neurological:     Mental Status: He is alert and oriented to person, place, and time.  Psychiatric:        Mood and Affect: Mood normal.        Behavior: Behavior normal.     Procedure note, infiltrated with 1% lidocaine with epi after cleaning with Betadine.  No bleeding. Opened with  forceps and cystic cheesy like material drained.  No purulent material.  No sign of infection. Tolerated procedure well.  No complications.  ASSESSMENT & PLAN: Problem List Items Addressed This Visit   None Visit Diagnoses     Sebaceous cyst    -  Primary   Relevant Medications   mupirocin cream (BACTROBAN) 2 %      Patient Instructions  Epidermoid Cyst An epidermoid cyst, also called an epidermal cyst, is a small lump under your skin. The cyst contains a substance called keratin. Do not try to pop or open the cyst yourself. What are the causes? A blocked hair follicle. A hair that curls and re-enters the skin instead of growing straight out of the skin. A blocked pore. Irritated skin. An injury to the skin. Certain conditions that are passed along from parent to child. Human papillomavirus (HPV). This happens rarely when cysts occur on the bottom of the feet. Long-term sun damage to the skin. What increases the risk? Having acne. Being male. Having an injury to the skin. Being past puberty. Having certain conditions caused by genes (genetic disorder) What are the signs or symptoms? These cysts are usually harmless, but they can get infected. Symptoms of infection may include: Redness. Inflammation. Tenderness. Warmth. Fever. A bad-smelling substance that drains from the cyst. Pus that drains from the cyst. How is this treated? In many cases, epidermoid cysts go away on their own without treatment. If a cyst becomes infected, treatment may include: Opening and draining the cyst, done by a doctor. After draining, you may need minor surgery to remove the rest of the cyst. Antibiotic medicine. Shots of medicines (steroids) that help to reduce inflammation. Surgery to remove the cyst. Surgery may be done if the cyst: Becomes large. Bothers you. Has a chance of turning into cancer. Do not try to open a cyst yourself. Follow these instructions  at home: Medicines Take  over-the-counter and prescription medicines as told by your doctor. If you were prescribed an antibiotic medicine, take it as told by your doctor. Do not stop taking it even if you start to feel better. General instructions Keep the area around your cyst clean and dry. Wear loose, dry clothing. Avoid touching your cyst. Check your cyst every day for signs of infection. Check for: Redness, swelling, or pain. Fluid or blood. Warmth. Pus or a bad smell. Keep all follow-up visits. How is this prevented? Wear clean, dry, clothing. Avoid wearing tight clothing. Keep your skin clean and dry. Take showers or baths every day. Contact a doctor if: Your cyst has symptoms of infection. Your condition does not improve or gets worse. You have a cyst that looks different from other cysts you have had. You have a fever. Get help right away if: Redness spreads from the cyst into the area close by. Summary An epidermoid cyst is a small lump under your skin. If a cyst becomes infected, treatment may include surgery to open and drain the cyst, or to remove it. Take over-the-counter and prescription medicines only as told by your doctor. Contact a doctor if your condition is not improving or is getting worse. Keep all follow-up visits. This information is not intended to replace advice given to you by your health care provider. Make sure you discuss any questions you have with your health care provider. Document Revised: 06/08/2019 Document Reviewed: 06/08/2019 Elsevier Patient Education  2022 [place], MD [facility]

## 2021-02-27 ENCOUNTER — Telehealth: Payer: Self-pay | Admitting: Internal Medicine

## 2021-02-27 MED ORDER — MUPIROCIN 2 % EX OINT: 1.0000 "application " | TOPICAL_OINTMENT | Freq: Two times a day (BID) | CUTANEOUS | 0 refills | Status: DC

## 2021-02-27 NOTE — Telephone Encounter (Signed)
Okay to switch to ointment

## 2021-02-27 NOTE — Telephone Encounter (Signed)
Updated prescription sent to the patient's pharmacy.

## 2021-02-27 NOTE — Telephone Encounter (Signed)
Pharmacy requesting rx mupirocin cream (BACTROBAN) 2 % switch to an ointment per insurance preference

## 2021-03-01 ENCOUNTER — Other Ambulatory Visit: Payer: Self-pay | Admitting: *Deleted

## 2021-03-01 DIAGNOSIS — F17210 Nicotine dependence, cigarettes, uncomplicated: Secondary | ICD-10-CM

## 2021-03-01 DIAGNOSIS — Z87891 Personal history of nicotine dependence: Secondary | ICD-10-CM

## 2021-03-12 DIAGNOSIS — R220 Localized swelling, mass and lump, head: Secondary | ICD-10-CM | POA: Diagnosis not present

## 2021-03-12 DIAGNOSIS — L03211 Cellulitis of face: Secondary | ICD-10-CM | POA: Diagnosis not present

## 2021-03-12 DIAGNOSIS — R519 Headache, unspecified: Secondary | ICD-10-CM | POA: Diagnosis not present

## 2021-03-13 ENCOUNTER — Ambulatory Visit (INDEPENDENT_AMBULATORY_CARE_PROVIDER_SITE_OTHER)
Admission: RE | Admit: 2021-03-13 | Discharge: 2021-03-13 | Disposition: A | Discharge status: Home / Self Care | Payer: Medicare HMO | Source: Ambulatory Visit | Attending: Acute Care | Admitting: Acute Care

## 2021-03-13 ENCOUNTER — Other Ambulatory Visit: Payer: Self-pay

## 2021-03-13 DIAGNOSIS — Z87891 Personal history of nicotine dependence: Secondary | ICD-10-CM | POA: Diagnosis not present

## 2021-03-13 DIAGNOSIS — F17210 Nicotine dependence, cigarettes, uncomplicated: Secondary | ICD-10-CM

## 2021-03-14 ENCOUNTER — Inpatient Hospital Stay: Admission: RE | Admit: 2021-03-14 | Payer: Medicare HMO | Source: Ambulatory Visit

## 2021-03-15 ENCOUNTER — Other Ambulatory Visit: Payer: Self-pay | Admitting: Acute Care

## 2021-03-15 DIAGNOSIS — F17210 Nicotine dependence, cigarettes, uncomplicated: Secondary | ICD-10-CM

## 2021-03-15 DIAGNOSIS — Z87891 Personal history of nicotine dependence: Secondary | ICD-10-CM

## 2021-03-19 ENCOUNTER — Encounter: Payer: Self-pay | Admitting: Internal Medicine

## 2021-03-19 DIAGNOSIS — C44529 Squamous cell carcinoma of skin of other part of trunk: Secondary | ICD-10-CM | POA: Diagnosis not present

## 2021-03-19 DIAGNOSIS — D485 Neoplasm of uncertain behavior of skin: Secondary | ICD-10-CM | POA: Diagnosis not present

## 2021-03-29 ENCOUNTER — Telehealth: Payer: Self-pay | Admitting: Internal Medicine

## 2021-03-29 NOTE — Telephone Encounter (Signed)
[facility] [place] calling in  Need a medical clearance letter to extract tooth on 01.20.23.. patient says he is currently on blood thinners  [facility] # [phone] Fax # [phone]

## 2021-04-01 NOTE — Telephone Encounter (Signed)
Spoke with [employer] and advised them that we have not received a medical clearance form. Form is being re-faxed to our office. Office fax number was provided.

## 2021-04-04 NOTE — Telephone Encounter (Signed)
[facility] called in stating someone would be sending a a medical clearance letter for the patient. Letter has not been received and the letter needs to be in before the patients procedure tomorrow at 12:00 pm.

## 2021-04-05 DIAGNOSIS — R69 Illness, unspecified: Secondary | ICD-10-CM | POA: Diagnosis not present

## 2021-04-05 NOTE — Telephone Encounter (Signed)
Spoke with [place]. Paperwork was faxed back to their office. She states that the paperwork was not received. She is requesting that it be emailed to [email or web address]

## 2021-04-05 NOTE — Telephone Encounter (Signed)
Form has been emailed to the e-mail address provided below.

## 2021-04-10 ENCOUNTER — Other Ambulatory Visit: Payer: Self-pay | Admitting: Internal Medicine

## 2021-04-10 DIAGNOSIS — J410 Simple chronic bronchitis: Secondary | ICD-10-CM

## 2021-04-19 ENCOUNTER — Encounter: Payer: Self-pay | Admitting: Cardiology

## 2021-04-19 ENCOUNTER — Other Ambulatory Visit: Payer: Self-pay

## 2021-04-19 ENCOUNTER — Ambulatory Visit: Payer: Medicare HMO | Admitting: Cardiology

## 2021-04-19 DIAGNOSIS — D6869 Other thrombophilia: Secondary | ICD-10-CM

## 2021-04-19 DIAGNOSIS — I6523 Occlusion and stenosis of bilateral carotid arteries: Secondary | ICD-10-CM | POA: Diagnosis not present

## 2021-04-19 DIAGNOSIS — I700 Atherosclerosis of aorta: Secondary | ICD-10-CM | POA: Diagnosis not present

## 2021-04-19 DIAGNOSIS — I480 Paroxysmal atrial fibrillation: Secondary | ICD-10-CM | POA: Diagnosis not present

## 2021-04-19 DIAGNOSIS — I951 Orthostatic hypotension: Secondary | ICD-10-CM

## 2021-04-19 DIAGNOSIS — I5022 Chronic systolic (congestive) heart failure: Secondary | ICD-10-CM

## 2021-04-19 DIAGNOSIS — I2510 Atherosclerotic heart disease of native coronary artery without angina pectoris: Secondary | ICD-10-CM

## 2021-04-19 DIAGNOSIS — I4891 Unspecified atrial fibrillation: Secondary | ICD-10-CM

## 2021-04-19 DIAGNOSIS — J432 Centrilobular emphysema: Secondary | ICD-10-CM | POA: Diagnosis not present

## 2021-04-19 NOTE — Assessment & Plan Note (Signed)
Has developed this in the past.  His Toprol was reduced to 25 mg at prior office visit with Kathyrn Drown, NP reviewed.

## 2021-04-19 NOTE — Assessment & Plan Note (Signed)
Repeat Doppler in 2022 showed mild plaque.  No bruits noted.  Continue with statin, blood pressure control.

## 2021-04-19 NOTE — Assessment & Plan Note (Addendum)
Improvement from original EF of 25% to 60 to 65% in 2021.  It may actually be more in the 45% range upon further review.    Continuing with goal-directed medical therapy.  Currently on ACE inhibitor.  Would not tolerate Entresto secondary to hypotension.  Would be challenging to add spironolactone as well because of hypotension.

## 2021-04-19 NOTE — Assessment & Plan Note (Signed)
Continue to encourage tobacco cessation. 

## 2021-04-19 NOTE — Assessment & Plan Note (Signed)
Seen on CT scan personally reviewed.  Continue with statin therapy.

## 2021-04-19 NOTE — Assessment & Plan Note (Signed)
Prior LDL 48.  Excellent.  Continue with high intensity statin atorvastatin 40 mg once a day.

## 2021-04-19 NOTE — Assessment & Plan Note (Signed)
Known CTO of distal RCA with collaterals.  Continue with aggressive medical management.  Stress test 2015 showed no ischemia.  No anginal symptoms.  On beta-blocker, statin.  He is not on aspirin secondary to Xarelto use.

## 2021-04-19 NOTE — Assessment & Plan Note (Signed)
Currently on amiodarone 200 mg a day.  High risk medication.  Continue with monitoring of liver lungs thyroid.  Maintaining sinus rhythm well.  Trying to avoid atrial fibrillation given his prior significantly reduced ejection fraction.

## 2021-04-19 NOTE — Progress Notes (Signed)
Cardiology Office Note:    Date:  04/19/2021   ID:  [name], DOB [birth year], MRN [id number]  PCP:  Hoyt Koch, MD   [facility] Providers Cardiologist:  Candee Furbish, MD     Referring MD: Hoyt Koch, *    History of Present Illness:    [name] is a 71 y.o. male here for the follow-up of paroxysmal atrial fibrillation, chronic systolic heart failure now with resolution of EF.  Atrial flutter ablation 2012 DC cardioversion 2017 Heart catheterization 2012-CTO of distal RCA with collaterals-medical management Echo 2021 EF 60 to 65% after initial echo 25%  Overall been doing quite well.  Minimal dizziness with standing.  Blood pressure has been on the low side.  Still smokes.  No bleeding issues with Xarelto.  Past Medical History:  Diagnosis Date   Adenomatous colon polyp    Alcohol use    Antiphospholipid antibody positive    ???In the past???but not proven according to patient   Antiphospholipid antibody positive    ??? in the past, but not proven according to the patient.   Atrial flutter ([place])    atrial flutter ablation,   Dr.Allred October 18, 2010   BPH (benign prostatic hypertrophy)    CAD (coronary artery disease)    Catheterization 2005 disease / catheterization July 06, 2010, chronic total occlusion distal RCA with collaterals from right and left side.  Medical therapy recommended, mild decreased LV function        Carotid artery disease (HCC)    0-27% R. ICA, 74-12% LICA... Doppler.... January, 2010  /  Doppler... in January, 2011 no change  /  doppler1/27/2012...stable  8-78% RICA 67-67% LICA   CHF (congestive heart failure) (HCC)    COPD (chronic obstructive pulmonary disease) (HCC)    CRAO (central retinal artery occlusion)    Ejection fraction     EF 50-55%, echo, March, 2012... inferobasal and distal septal hypokinesis /      60%, echo, November, 2008   GERD (gastroesophageal reflux disease)    Hypercholesterolemia     Hypertension    Myocardial infarction ([place])    Nonsustained ventricular tachycardia    Palpitations    probable SVT, rate 170,, at home,, lasted one hour,, 2012   Prostate cancer ([place])    Raynaud's syndrome    Stroke ([place])    mild stroke per pt    TIA (transient ischemic attack)    Possible small vessel tia in the past.   Tobacco abuse     Past Surgical History:  Procedure Laterality Date   CARDIAC CATHETERIZATION  06/27/2003   2005.... mild irregularity of the LAD..( patient had had abnormal Myoview scan)   CARDIOVERSION N/A 03/06/2016   Procedure: CARDIOVERSION;  Surgeon: Josue Hector, MD;  Location: [place];  Service: Cardiovascular;  Laterality: N/A;   INGUINAL HERNIA REPAIR  2009   right with mesh.  Dr Marlou Starks   TEE WITHOUT CARDIOVERSION N/A 03/06/2016   Procedure: TRANSESOPHAGEAL ECHOCARDIOGRAM (TEE);  Surgeon: Josue Hector, MD;  Location: [place];  Service: Cardiovascular;  Laterality: N/A;   THYROGLOSSAL DUCT CYST  2006   Dr. Wilburn Cornelia   TRANSURETHRAL RESECTION OF PROSTATE N/A 05/15/2020   Procedure: TRANSURETHRAL RESECTION OF THE PROSTATE (TURP);  Surgeon: Irine Seal, MD;  Location: [facility];  Service: Urology;  Laterality: N/A;    Current Medications: Current Meds  Medication Sig   albuterol (VENTOLIN HFA) 108 (90 Base) MCG/ACT inhaler INHALE TWO PUFFS BY MOUTH  INTO LUNGS EVERY 4 HOURS as needed FOR WHEEZING AND/OR SHORTNESS OF BREATH   amiodarone (PACERONE) 200 MG tablet TAKE 1/2 TABLET BY MOUTH ONCE DAILY   atorvastatin (LIPITOR) 40 MG tablet TAKE ONE TABLET BY MOUTH DAILY   b complex vitamins capsule Take 1 capsule by mouth daily.   finasteride (PROSCAR) 5 MG tablet Take 5 mg by mouth daily.   folic acid (FOLVITE) 474 MCG tablet Take 400 mcg by mouth daily.   lisinopril (ZESTRIL) 10 MG tablet TAKE ONE TABLET BY MOUTH DAILY   metoprolol succinate (TOPROL-XL) 50 MG 24 hr tablet Take 25 mg by mouth daily. Take with or immediately following a meal.   Multiple  Vitamin (MULTIVITAMIN WITH MINERALS) TABS tablet Take 1 tablet by mouth daily.   mupirocin ointment (BACTROBAN) 2 % Apply 1 application topically 2 (two) times daily.   Tiotropium Bromide-Olodaterol (STIOLTO RESPIMAT) 2.5-2.5 MCG/ACT AERS Inhale 2 puffs into the lungs daily.   XARELTO 20 MG TABS tablet TAKE ONE TABLET BY MOUTH ONCE DAILY     Allergies:   Cialis [tadalafil], Levitra [vardenafil], and Viagra [sildenafil citrate]   Social History   Socioeconomic History   Marital status: Married    Spouse name: Not on file   Number of children: Not on file   Years of education: Not on file   Highest education level: Not on file  Occupational History   Occupation: land Surveyor/ retired  Tobacco Use   Smoking status: Every Day    Packs/day: 0.50    Years: 50.00    Pack years: 25.00    Types: Cigarettes    Start date: 1970   Smokeless tobacco: Never   Tobacco comments:    11/30/19 .5-1pack per day  Vaping Use   Vaping Use: Never used  Substance and Sexual Activity   Alcohol use: Yes    Alcohol/week: 14.0 - 21.0 standard drinks    Types: 14 - 21 Cans of beer per week   Drug use: No   Sexual activity: Not Currently  Other Topics Concern   Not on file  Social History Narrative   Not on file   Social Determinants of Health   Financial Resource Strain: Not on file  Food Insecurity: Not on file  Transportation Needs: Not on file  Physical Activity: Not on file  Stress: Not on file  Social Connections: Not on file     Family History: The patient's He was adopted. Family history is unknown by patient.  ROS:   Please see the history of present illness.    No fevers chills nausea vomiting syncope bleeding all other systems reviewed and are negative.  EKGs/Labs/Other Studies Reviewed:    The following studies were reviewed today: As above  EKG:  Prior EKG shows sinus rhythm with IVCD heart rate of 65 Recent Labs: 05/03/2020: BUN 19; Creatinine, Ser 1.09; Hemoglobin  16.7; Platelets 268; Potassium 4.9; Sodium 138 10/15/2020: ALT 17; TSH 3.030  Recent Lipid Panel    Component Value Date/Time   CHOL 124 09/22/2019 0903   TRIG 66 09/22/2019 0903   HDL 62 09/22/2019 0903   CHOLHDL 2.0 09/22/2019 0903   CHOLHDL 2 07/27/2017 0906   VLDL 12.2 07/27/2017 0906   LDLCALC 48 09/22/2019 0903     Risk Assessment/Calculations:              Physical Exam:    VS:  BP (!) 94/50 (BP Location: Left Arm, Patient Position: Sitting, Cuff Size: Normal)    Pulse  74    Ht 6\' 2"  (1.88 m)    Wt 162 lb (73.5 kg)    BMI 20.80 kg/m     Wt Readings from Last 3 Encounters:  04/19/21 162 lb (73.5 kg)  02/26/21 169 lb (76.7 kg)  10/15/20 164 lb (74.4 kg)     GEN:  Well nourished, well developed in no acute distress HEENT: Normal NECK: No JVD; No carotid bruits LYMPHATICS: No lymphadenopathy CARDIAC: RRR, no murmurs, no rubs, gallops RESPIRATORY:  Clear to auscultation without rales, wheezing or rhonchi  ABDOMEN: Soft, non-tender, non-distended MUSCULOSKELETAL:  No edema; No deformity  SKIN: Warm and dry NEUROLOGIC:  Alert and oriented x 3 PSYCHIATRIC:  Normal affect   ASSESSMENT:    1. Coronary artery disease involving native coronary artery of native heart without angina pectoris   2. Bilateral carotid artery stenosis   3. Chronic systolic heart failure ([place])   4. Centrilobular emphysema ([place])   5. Aortic atherosclerosis ([place])   6. Hypercoagulable state due to atrial fibrillation ([place])   7. Orthostatic hypotension   8. Paroxysmal atrial fibrillation (HCC)    PLAN:    In order of problems listed above:  CAD (coronary artery disease) Known CTO of distal RCA with collaterals.  Continue with aggressive medical management.  Stress test 2015 showed no ischemia.  No anginal symptoms.  On beta-blocker, statin.  He is not on aspirin secondary to Xarelto use.  Carotid artery disease ([place]) Repeat Doppler in 2022 showed mild plaque.  No bruits noted.  Continue  with statin, blood pressure control.  Chronic systolic heart failure (HCC) Improvement from original EF of 25% to 60 to 65% in 2021.  It may actually be more in the 45% range upon further review.    Continuing with goal-directed medical therapy.  Currently on ACE inhibitor.  Would not tolerate Entresto secondary to hypotension.  Would be challenging to add spironolactone as well because of hypotension.  Centrilobular emphysema ([place]) Continue to encourage tobacco cessation.  Hyperlipidemia Prior LDL 48.  Excellent.  Continue with high intensity statin atorvastatin 40 mg once a day.  Aortic atherosclerosis ([place]) Seen on CT scan personally reviewed.  Continue with statin therapy.  Hypercoagulable state due to atrial fibrillation (HCC) On Xarelto.  No excessive bleeding.  Continue to monitor this high risk medication with CBC as well as creatinine.  Last lab work reviewed.  Orthostatic hypotension Has developed this in the past.  His Toprol was reduced to 25 mg at prior office visit with Kathyrn Drown, NP reviewed.  Paroxysmal atrial fibrillation (HCC) Currently on amiodarone 200 mg a day.  High risk medication.  Continue with monitoring of liver lungs thyroid.  Maintaining sinus rhythm well.  Trying to avoid atrial fibrillation given his prior significantly reduced ejection fraction.         Medication Adjustments/Labs and Tests Ordered: Current medicines are reviewed at length with the patient today.  Concerns regarding medicines are outlined above.  No orders of the defined types were placed in this encounter.  No orders of the defined types were placed in this encounter.   Patient Instructions  Medication Instructions:  Please continue your current medications as listed.  *If you need a refill on your cardiac medications before your next appointment, please call your pharmacy*  Follow-Up: At [facility], you and your health needs are our priority.  As part of our  continuing mission to provide you with exceptional heart care, we have created designated Provider Care Teams.  These Care Teams include your primary Cardiologist (physician) and Advanced Practice Providers (APPs -  Physician Assistants and Nurse Practitioners) who all work together to provide you with the care you need, when you need it.  We recommend signing up for the patient portal called "MyChart".  Sign up information is provided on this After Visit Summary.  MyChart is used to connect with patients for Virtual Visits (Telemedicine).  Patients are able to view lab/test results, encounter notes, upcoming appointments, etc.  Non-urgent messages can be sent to your provider as well.   To learn more about what you can do with MyChart, go to [email or web address].    Your next appointment:   6 month(s)  The format for your next appointment:   In Person  Provider:   Nicholes Rough, PA-C, Melina Copa, PA-C, Ermalinda Barrios, PA-C, Christen Bame, NP, or Richardson Dopp, PA-C     Then, Candee Furbish, MD will plan to see you again in 1 year(s).   Thank you for choosing [facility]!!      Signed, Candee Furbish, MD  04/19/2021 9:27 AM    [place]

## 2021-04-19 NOTE — Patient Instructions (Signed)
Medication Instructions:  Please continue your current medications as listed.  *If you need a refill on your cardiac medications before your next appointment, please call your pharmacy*  Follow-Up: At [facility], you and your health needs are our priority.  As part of our continuing mission to provide you with exceptional heart care, we have created designated Provider Care Teams.  These Care Teams include your primary Cardiologist (physician) and Advanced Practice Providers (APPs -  Physician Assistants and Nurse Practitioners) who all work together to provide you with the care you need, when you need it.  We recommend signing up for the patient portal called "MyChart".  Sign up information is provided on this After Visit Summary.  MyChart is used to connect with patients for Virtual Visits (Telemedicine).  Patients are able to view lab/test results, encounter notes, upcoming appointments, etc.  Non-urgent messages can be sent to your provider as well.   To learn more about what you can do with MyChart, go to [email or web address].    Your next appointment:   6 month(s)  The format for your next appointment:   In Person  Provider:   Nicholes Rough, PA-C, Melina Copa, PA-C, Ermalinda Barrios, PA-C, Christen Bame, NP, or Richardson Dopp, PA-C     Then, Candee Furbish, MD will plan to see you again in 1 year(s).   Thank you for choosing [facility]!!

## 2021-04-19 NOTE — Assessment & Plan Note (Signed)
On Xarelto.  No excessive bleeding.  Continue to monitor this high risk medication with CBC as well as creatinine.  Last lab work reviewed.

## 2021-05-01 ENCOUNTER — Other Ambulatory Visit: Payer: Self-pay | Admitting: Internal Medicine

## 2021-05-06 ENCOUNTER — Other Ambulatory Visit: Payer: Self-pay | Admitting: Cardiology

## 2021-05-06 DIAGNOSIS — I4891 Unspecified atrial fibrillation: Secondary | ICD-10-CM

## 2021-05-06 NOTE — Telephone Encounter (Incomplete Revision)
Xarelto 20mg  refill request received. Pt is 71 years old, weight-73.5kg, Crea-1.09 on 05/03/2020-NEEDS UPDATED LABS, last seen by Dr. [name] on 04/19/2021, Diagnosis-Afib, CrCl-66.71ml/min; Dose is appropriate based on dosing criteria.   Pt needs updated labs. Called pt and ordered labs to be drawn on Wednesday, he states he has a week supply of Xarelto on hand.   PT AWARE WILL NOT SEND REFILL SINCE HE HAS ENOUGH TO LAST UNTIL LAB APPT.   Pt came on 05/08/21 to have labs drawn awaiting results & then will send in refill.

## 2021-05-06 NOTE — Telephone Encounter (Addendum)
Xarelto 20mg  refill request received. Pt is 71 years old, weight-73.5kg, Crea-1.09 on 05/03/2020-NEEDS UPDATED LABS, last seen by Dr. [name] on 04/19/2021, Diagnosis-Afib, CrCl-66.73ml/min; Dose is appropriate based on dosing criteria.   Pt needs updated labs. Called pt and ordered labs to be drawn on Wednesday, he states he has a week supply of Xarelto on hand.   PT AWARE WILL NOT SEND REFILL SINCE HE HAS ENOUGH TO LAST UNTIL LAB APPT.   Pt came on 05/08/21 to have labs drawn awaiting results & then will send in refill.

## 2021-05-08 ENCOUNTER — Other Ambulatory Visit: Payer: Medicare HMO | Admitting: *Deleted

## 2021-05-08 ENCOUNTER — Other Ambulatory Visit: Payer: Self-pay

## 2021-05-08 DIAGNOSIS — I4891 Unspecified atrial fibrillation: Secondary | ICD-10-CM

## 2021-05-08 LAB — CBC
Hematocrit: 45.9 % (ref 37.5–51.0)
Hemoglobin: 15.7 g/dL (ref 13.0–17.7)
MCH: 32.6 pg (ref 26.6–33.0)
MCHC: 34.2 g/dL (ref 31.5–35.7)
MCV: 95 fL (ref 79–97)
Platelets: 282 10*3/uL (ref 150–450)
RBC: 4.82 x10E6/uL (ref 4.14–5.80)
RDW: 12.6 % (ref 11.6–15.4)
WBC: 7.9 10*3/uL (ref 3.4–10.8)

## 2021-05-08 LAB — BASIC METABOLIC PANEL
BUN/Creatinine Ratio: 13 (ref 10–24)
BUN: 17 mg/dL (ref 8–27)
CO2: 24 mmol/L (ref 20–29)
Calcium: 9.8 mg/dL (ref 8.6–10.2)
Chloride: 99 mmol/L (ref 96–106)
Creatinine, Ser: 1.31 mg/dL — ABNORMAL HIGH (ref 0.76–1.27)
Glucose: 80 mg/dL (ref 70–99)
Potassium: 4.8 mmol/L (ref 3.5–5.2)
Sodium: 138 mmol/L (ref 134–144)
eGFR: 59 mL/min/{1.73_m2} — ABNORMAL LOW (ref >59)

## 2021-05-09 NOTE — Telephone Encounter (Signed)
Prescription refill request for Xarelto received.  Indication: Afib  Last office visit: 05/09/21 [name])  Weight: 73.5kg Age: 71 Scr: 1.31 (05/08/21)  CrCl: 55.6ml/min  Appropriate dose and refill sent to requested pharmacy.

## 2021-05-13 ENCOUNTER — Encounter: Payer: Self-pay | Admitting: *Deleted

## 2021-06-17 DIAGNOSIS — L570 Actinic keratosis: Secondary | ICD-10-CM | POA: Diagnosis not present

## 2021-06-17 DIAGNOSIS — Z85828 Personal history of other malignant neoplasm of skin: Secondary | ICD-10-CM | POA: Diagnosis not present

## 2021-06-25 ENCOUNTER — Other Ambulatory Visit: Payer: Self-pay | Admitting: Internal Medicine

## 2021-06-25 DIAGNOSIS — J410 Simple chronic bronchitis: Secondary | ICD-10-CM

## 2021-07-08 ENCOUNTER — Telehealth: Payer: Self-pay | Admitting: Internal Medicine

## 2021-07-08 NOTE — Telephone Encounter (Signed)
N/A unable to leave a message for patient to call back to schedule Medicare Annual Wellness Visit  ? ?Last AWV  06/20/20 ? ?Please schedule at anytime with [facility] if patient calls the office back.   ? ? ? ?Any questions, please call me at [phone]  ?

## 2021-07-16 ENCOUNTER — Other Ambulatory Visit: Payer: Self-pay | Admitting: Internal Medicine

## 2021-08-05 ENCOUNTER — Other Ambulatory Visit: Payer: Self-pay | Admitting: Internal Medicine

## 2021-08-19 DIAGNOSIS — C61 Malignant neoplasm of prostate: Secondary | ICD-10-CM | POA: Diagnosis not present

## 2021-08-26 DIAGNOSIS — C61 Malignant neoplasm of prostate: Secondary | ICD-10-CM | POA: Diagnosis not present

## 2021-08-26 DIAGNOSIS — R351 Nocturia: Secondary | ICD-10-CM | POA: Diagnosis not present

## 2021-08-26 DIAGNOSIS — R972 Elevated prostate specific antigen [PSA]: Secondary | ICD-10-CM | POA: Diagnosis not present

## 2021-08-26 DIAGNOSIS — N401 Enlarged prostate with lower urinary tract symptoms: Secondary | ICD-10-CM | POA: Diagnosis not present

## 2021-08-27 ENCOUNTER — Other Ambulatory Visit: Payer: Self-pay | Admitting: Urology

## 2021-08-27 DIAGNOSIS — R972 Elevated prostate specific antigen [PSA]: Secondary | ICD-10-CM

## 2021-08-27 DIAGNOSIS — C61 Malignant neoplasm of prostate: Secondary | ICD-10-CM

## 2021-08-29 ENCOUNTER — Other Ambulatory Visit: Payer: Self-pay | Admitting: Internal Medicine

## 2021-09-10 ENCOUNTER — Ambulatory Visit
Admission: RE | Admit: 2021-09-10 | Discharge: 2021-09-10 | Disposition: A | Discharge status: Home / Self Care | Payer: Medicare HMO | Source: Ambulatory Visit | Attending: Urology | Admitting: Urology

## 2021-09-10 ENCOUNTER — Other Ambulatory Visit: Payer: Self-pay | Admitting: Internal Medicine

## 2021-09-10 DIAGNOSIS — C61 Malignant neoplasm of prostate: Secondary | ICD-10-CM

## 2021-09-10 DIAGNOSIS — R972 Elevated prostate specific antigen [PSA]: Secondary | ICD-10-CM

## 2021-09-10 DIAGNOSIS — K5730 Diverticulosis of large intestine without perforation or abscess without bleeding: Secondary | ICD-10-CM | POA: Diagnosis not present

## 2021-09-10 DIAGNOSIS — J410 Simple chronic bronchitis: Secondary | ICD-10-CM

## 2021-09-10 DIAGNOSIS — N4289 Other specified disorders of prostate: Secondary | ICD-10-CM | POA: Diagnosis not present

## 2021-09-10 MED ORDER — GADOBENATE DIMEGLUMINE 529 MG/ML IV SOLN
15.0000 mL | Freq: Once | INTRAVENOUS | Status: AC | PRN
  Administered 2021-09-10: 15 mL via INTRAVENOUS

## 2021-10-23 ENCOUNTER — Other Ambulatory Visit: Payer: Self-pay | Admitting: Internal Medicine

## 2021-10-24 ENCOUNTER — Other Ambulatory Visit: Payer: Self-pay | Admitting: Cardiology

## 2021-10-24 ENCOUNTER — Other Ambulatory Visit: Payer: Self-pay | Admitting: Internal Medicine

## 2021-10-30 ENCOUNTER — Other Ambulatory Visit: Payer: Self-pay | Admitting: Internal Medicine

## 2021-10-30 ENCOUNTER — Other Ambulatory Visit: Payer: Self-pay | Admitting: Cardiology

## 2021-10-30 MED ORDER — METOPROLOL SUCCINATE ER 50 MG PO TB24: 25.0000 mg | ORAL_TABLET | Freq: Every day | ORAL | 1 refills | Status: DC

## 2021-11-10 NOTE — Progress Notes (Unsigned)
Office Visit    Patient Name: [name] Date of Encounter: 11/11/2021  PCP:  Hoyt Koch, MD   [place]  Cardiologist:  Candee Furbish, MD  Advanced Practice Provider:  No care team member to display Electrophysiologist:  None   HPI    [name] is a 71 y.o. male with past medical history significant for paroxysmal atrial fibrillation, chronic systolic heart failure with resolution of EF, alcohol use, CAD (catheterization 2012 showed chronic total occlusion of the distal RCA with collaterals from right to left, medical therapy recommended), carotid artery disease, COPD, hypertension, hyperlipidemia, palpitations, stroke, tobacco abuse presents today for 34-monthfollow-up appointment.  He was seen by Dr. SMarlou Porch2/2023 and overall has been doing well.  He does have history of atrial fibrillation with ablation in 2012 and DC cardioversion in 2017.  His history also includes chronic systolic heart failure with EF back in 2021 of 66 5% after initial echo showing EF 25%.  He was having some minimal dizziness with standing at his last appointment.  Blood pressure had been low.  No bleeding issues with Xarelto.  Still smoking.  Today, he feels pretty good.  He is still smoking.  He still is having some dizziness when he stands up too quickly but it subsides quickly.  He does have a blood pressure cuff and we have asked him to take his blood pressure daily for 2 weeks.  He tells me that his shortness of breath has gotten a little bit worse but he does have issues with his lungs as well.  He has a history of carotid artery disease and does not have an updated ultrasound.  We discussed this as well.  Reports no chest pain, pressure, or tightness. No edema, orthopnea, PND. Reports no palpitations.    Past Medical History    Past Medical History:  Diagnosis Date   Adenomatous colon polyp    Alcohol use    Antiphospholipid antibody positive    ???In the  past???but not proven according to patient   Antiphospholipid antibody positive    ??? in the past, but not proven according to the patient.   [place]    atrial flutter ablation,   Dr.Allred October 18, 2010   BPH (benign prostatic hypertrophy)    CAD (coronary artery disease)    Catheterization 2005 disease / catheterization July 06, 2010, chronic total occlusion distal RCA with collaterals from right and left side.  Medical therapy recommended, mild decreased LV function        Carotid artery disease (HCC)    08-18%R. ICA, 429-93%LICA... Doppler.... January, 2010  /  Doppler... in January, 2011 no change  /  doppler1/27/2012...stable  07-16%RICA 496-78%LICA   CHF (congestive heart failure) (HCC)    COPD (chronic obstructive pulmonary disease) (HCC)    CRAO (central retinal artery occlusion)    Ejection fraction     EF 50-55%, echo, March, 2012... inferobasal and distal septal hypokinesis /      60%, echo, November, 2008   GERD (gastroesophageal reflux disease)    Hypercholesterolemia    Hypertension    [place]    Nonsustained ventricular tachycardia (HCC)    Palpitations    probable SVT, rate 170,, at home,, lasted one hour,, 2012   [place] cancer ([place]    Raynaud's syndrome    [place] ([place]    mild stroke per pt    TIA (transient ischemic  attack)    Possible small vessel tia in the past.   Tobacco abuse    Past Surgical History:  Procedure Laterality Date   CARDIAC CATHETERIZATION  06/27/2003   2005.... mild irregularity of the LAD..( patient had had abnormal Myoview scan)   CARDIOVERSION N/A 03/06/2016   Procedure: CARDIOVERSION;  Surgeon: Josue Hector, MD;  Location: [place];  Service: Cardiovascular;  Laterality: N/A;   INGUINAL HERNIA REPAIR  2009   right with mesh.  Dr Marlou Starks   TEE WITHOUT CARDIOVERSION N/A 03/06/2016   Procedure: TRANSESOPHAGEAL ECHOCARDIOGRAM (TEE);  Surgeon: Josue Hector, MD;  Location: [place];  Service:  Cardiovascular;  Laterality: N/A;   THYROGLOSSAL DUCT CYST  2006   Dr. Wilburn Cornelia   TRANSURETHRAL RESECTION OF PROSTATE N/A 05/15/2020   Procedure: TRANSURETHRAL RESECTION OF THE PROSTATE (TURP);  Surgeon: Irine Seal, MD;  [facility];  Service: Urology;  Laterality: N/A;    Allergies  Allergies  Allergen Reactions   Cialis [Tadalafil] Other (See Comments)    History of stroke    Levitra [Vardenafil] Other (See Comments)    History of stroke    Viagra [Sildenafil Citrate] Other (See Comments)    History of stroke      EKGs/Labs/Other Studies Reviewed:   The following studies were reviewed today:  Echocardiogram 10/19/2019  IMPRESSIONS     1. Left ventricular ejection fraction, by estimation, is 60 to 65%. The  left ventricle has normal function. The left ventricle has no regional  wall motion abnormalities. Left ventricular diastolic parameters were  normal.   2. Right ventricular systolic function is normal. The right ventricular  size is normal.   3. The mitral valve is grossly normal. Trivial mitral valve  regurgitation.   4. The aortic valve is normal in structure. Aortic valve regurgitation is  not visualized. No aortic stenosis is present.   Carotid ultrasound 04/05/2020 Summary:  Right Carotid: Velocities in the right ICA are consistent with a 1-39%  stenosis.                Non-hemodynamically significant plaque <50% noted in the  CCA. The                 ECA appears >50% stenosed.   Left Carotid: Velocities in the left ICA are consistent with a 1-39%  stenosis.   Vertebrals: Bilateral vertebral arteries demonstrate antegrade flow.  Subclavians: Normal flow hemodynamics were seen in bilateral subclavian               arteries.   EKG:  EKG is  ordered today.  The ekg ordered today demonstrates normal sinus rhythm, rate 67 bpm  Recent Labs: 05/08/2021: BUN 17; Creatinine, Ser 1.31; Hemoglobin 15.7; Platelets 282; Potassium 4.8; Sodium 138  Recent Lipid  Panel    Component Value Date/Time   CHOL 124 09/22/2019 0903   TRIG 66 09/22/2019 0903   HDL 62 09/22/2019 0903   CHOLHDL 2.0 09/22/2019 0903   CHOLHDL 2 07/27/2017 0906   VLDL 12.2 07/27/2017 0906   LDLCALC 48 09/22/2019 0903    Risk Assessment/Calculations:   CHA2DS2-VASc Score = 6   This indicates a 9.7% annual risk of stroke. The patient's score is based upon: CHF History: 1 HTN History: 1 Diabetes History: 0 Stroke History: 2 Vascular Disease History: 1 Age Score: 1 Gender Score: 0     Home Medications   Current Meds  Medication Sig   albuterol (VENTOLIN HFA) 108 (90 Base) MCG/ACT inhaler  INHALE TWO PUFFS BY MOUTH INTO LUNGS EVERY 4 HOURS AS NEEDED FOR SHORTNESS OF BREATH OR wheezing   amiodarone (PACERONE) 200 MG tablet TAKE 1/2 TABLET BY MOUTH ONCE DAILY   atorvastatin (LIPITOR) 40 MG tablet TAKE ONE TABLET BY MOUTH ONCE DAILY Needs appointment for further refills   b complex vitamins capsule Take 1 capsule by mouth daily.   lisinopril (ZESTRIL) 10 MG tablet TAKE ONE TABLET BY MOUTH ONCE DAILY Overdue for annual appt w/ labs  ust see MD for refills   metoprolol succinate (TOPROL-XL) 50 MG 24 hr tablet Take 0.5 tablets (25 mg total) by mouth daily. Take with or immediately following a meal.   Multiple Vitamin (MULTIVITAMIN WITH MINERALS) TABS tablet Take 1 tablet by mouth daily.   rivaroxaban (XARELTO) 20 MG TABS tablet TAKE ONE TABLET BY MOUTH ONCE DAILY   Tiotropium Bromide-Olodaterol (STIOLTO RESPIMAT) 2.5-2.5 MCG/ACT AERS INHALE TWO PUFFS BY MOUTH INTO LUNGS DAILY. Needs appointment for further refills     Review of Systems      All other systems reviewed and are otherwise negative except as noted above.  Physical Exam    VS:  BP (!) 112/56   Pulse 67   Ht '6\' 2"'$  (1.88 m)   Wt 160 lb 6.4 oz (72.8 kg)   SpO2 92%   BMI 20.59 kg/m  , BMI Body mass index is 20.59 kg/m.  Wt Readings from Last 3 Encounters:  11/11/21 160 lb 6.4 oz (72.8 kg)  04/19/21 162  lb (73.5 kg)  02/26/21 169 lb (76.7 kg)     GEN: Well nourished, well developed, in no acute distress. HEENT: normal. Neck: Supple, no JVD, carotid bruits, or masses. Cardiac: RRR, no murmurs, rubs, or gallops. No clubbing, cyanosis, edema.  Radials/PT 2+ and equal bilaterally.  Respiratory:  Respirations regular and unlabored, clear to auscultation bilaterally. GI: Soft, nontender, nondistended. MS: No deformity or atrophy. Skin: Warm and dry, no rash. Neuro:  Strength and sensation are intact. Psych: Normal affect.  Assessment & Plan    CAD -no recent chest pain -Continue GDMT: Lipitor 40 mg daily, lisinopril 10 mg daily, metoprolol succinate 25 mg daily, Xarelto 20 mg daily, amiodarone 100 mg daily  Bilateral carotid artery stenosis -we ordered an updated carotid US -no bruit on exam  Chronic systolic heart failure -euvolemic today -some SOB which he does feel like is getting worse -We have ordered an updated echo  Centrilobular emphysema -Chronic SOB -he is due for a scan in the fall  Aortic atherosclerosis -continue Lipitor '40mg'$  daily -we will order LFTs and Lipid panel today  Hypercoagulable state due to A-fib -no issues with bleeding  Orthostatic hypotension  -still has some issues with lightheadedness with transitions  -hydration and encourage slow transitions  -BP stable today -I have asked him to take his BP daily for two weeks and send me those values via MyChart  Paroxysmal atrial fibrillation -no issues with palpitations -he is in NSR today -continue Xarelto for anticoagulation         Disposition: Follow up 6 months with Candee Furbish, MD or APP.  Signed, Elgie Collard, PA-C 11/11/2021, 10:11 AM [place]

## 2021-11-11 ENCOUNTER — Encounter: Payer: Self-pay | Admitting: Physician Assistant

## 2021-11-11 ENCOUNTER — Ambulatory Visit: Payer: Medicare HMO | Attending: Physician Assistant | Admitting: Physician Assistant

## 2021-11-11 VITALS — BP 112/56 | HR 67 | Ht 74.0 in | Wt 160.4 lb

## 2021-11-11 DIAGNOSIS — I4891 Unspecified atrial fibrillation: Secondary | ICD-10-CM | POA: Diagnosis not present

## 2021-11-11 DIAGNOSIS — I951 Orthostatic hypotension: Secondary | ICD-10-CM | POA: Diagnosis not present

## 2021-11-11 DIAGNOSIS — D6869 Other thrombophilia: Secondary | ICD-10-CM

## 2021-11-11 DIAGNOSIS — I6523 Occlusion and stenosis of bilateral carotid arteries: Secondary | ICD-10-CM | POA: Diagnosis not present

## 2021-11-11 DIAGNOSIS — J432 Centrilobular emphysema: Secondary | ICD-10-CM

## 2021-11-11 DIAGNOSIS — E782 Mixed hyperlipidemia: Secondary | ICD-10-CM | POA: Diagnosis not present

## 2021-11-11 DIAGNOSIS — I2510 Atherosclerotic heart disease of native coronary artery without angina pectoris: Secondary | ICD-10-CM | POA: Diagnosis not present

## 2021-11-11 DIAGNOSIS — I700 Atherosclerosis of aorta: Secondary | ICD-10-CM | POA: Diagnosis not present

## 2021-11-11 DIAGNOSIS — E7800 Pure hypercholesterolemia, unspecified: Secondary | ICD-10-CM

## 2021-11-11 DIAGNOSIS — I480 Paroxysmal atrial fibrillation: Secondary | ICD-10-CM | POA: Diagnosis not present

## 2021-11-11 DIAGNOSIS — I5022 Chronic systolic (congestive) heart failure: Secondary | ICD-10-CM

## 2021-11-11 LAB — LIPID PANEL
Chol/HDL Ratio: 2 ratio (ref 0.0–5.0)
Cholesterol, Total: 143 mg/dL (ref 100–199)
HDL: 73 mg/dL (ref >39)
LDL Chol Calc (NIH): 54 mg/dL (ref 0–99)
Triglycerides: 83 mg/dL (ref 0–149)
VLDL Cholesterol Cal: 16 mg/dL (ref 5–40)

## 2021-11-11 LAB — HEPATIC FUNCTION PANEL
ALT: 16 IU/L (ref 0–44)
AST: 20 IU/L (ref 0–40)
Albumin: 4.7 g/dL (ref 3.9–4.9)
Alkaline Phosphatase: 87 IU/L (ref 44–121)
Bilirubin Total: 0.4 mg/dL (ref 0.0–1.2)
Bilirubin, Direct: 0.15 mg/dL (ref 0.00–0.40)
Total Protein: 7 g/dL (ref 6.0–8.5)

## 2021-11-11 NOTE — Patient Instructions (Signed)
Medication Instructions:  Your physician recommends that you continue on your current medications as directed. Please refer to the Current Medication list given to you today.  *If you need a refill on your cardiac medications before your next appointment, please call your pharmacy*   Lab Work: Lipids and lft's today If you have labs (blood work) drawn today and your tests are completely normal, you will receive your results only by: [place] (if you have MyChart) OR A paper copy in the mail If you have any lab test that is abnormal or we need to change your treatment, we will call you to review the results.   Testing/Procedures: Your physician has requested that you have an echocardiogram. Echocardiography is a painless test that uses sound waves to create images of your heart. It provides your doctor with information about the size and shape of your heart and how well your heart's chambers and valves are working. This procedure takes approximately one hour. There are no restrictions for this procedure.   Your physician has requested that you have a carotid duplex. This test is an ultrasound of the carotid arteries in your neck. It looks at blood flow through these arteries that supply the brain with blood. Allow one hour for this exam. There are no restrictions or special instructions.    Follow-Up: At [facility], you and your health needs are our priority.  As part of our continuing mission to provide you with exceptional heart care, we have created designated Provider Care Teams.  These Care Teams include your primary Cardiologist (physician) and Advanced Practice Providers (APPs -  Physician Assistants and Nurse Practitioners) who all work together to provide you with the care you need, when you need it.  Your next appointment:   6 month(s)  The format for your next appointment:   In Person  Provider:   Candee Furbish, MD    Important Information About  [place]

## 2021-11-19 ENCOUNTER — Ambulatory Visit (HOSPITAL_COMMUNITY)
Admission: RE | Admit: 2021-11-19 | Discharge: 2021-11-19 | Disposition: A | Discharge status: Home / Self Care | Payer: Medicare HMO | Source: Ambulatory Visit | Attending: Cardiology | Admitting: Cardiology

## 2021-11-19 DIAGNOSIS — I6523 Occlusion and stenosis of bilateral carotid arteries: Secondary | ICD-10-CM | POA: Diagnosis not present

## 2021-11-21 ENCOUNTER — Other Ambulatory Visit: Payer: Self-pay | Admitting: Internal Medicine

## 2021-11-26 ENCOUNTER — Telehealth: Payer: Medicare HMO

## 2021-11-27 ENCOUNTER — Ambulatory Visit (HOSPITAL_COMMUNITY): Payer: Medicare HMO | Attending: Physician Assistant

## 2021-11-27 DIAGNOSIS — I5022 Chronic systolic (congestive) heart failure: Secondary | ICD-10-CM | POA: Diagnosis not present

## 2021-11-27 LAB — ECHOCARDIOGRAM COMPLETE
Area-P 1/2: 3.27 cm2
Calc EF: 65.3 %
S' Lateral: 3 cm
Single Plane A2C EF: 65.6 %
Single Plane A4C EF: 63.8 %

## 2021-11-29 ENCOUNTER — Ambulatory Visit (INDEPENDENT_AMBULATORY_CARE_PROVIDER_SITE_OTHER): Payer: Medicare HMO | Admitting: Internal Medicine

## 2021-11-29 ENCOUNTER — Other Ambulatory Visit: Payer: Self-pay | Admitting: Internal Medicine

## 2021-11-29 ENCOUNTER — Encounter: Payer: Self-pay | Admitting: Internal Medicine

## 2021-11-29 VITALS — BP 132/68 | HR 60 | Ht 72.5 in | Wt 161.0 lb

## 2021-11-29 DIAGNOSIS — I700 Atherosclerosis of aorta: Secondary | ICD-10-CM | POA: Diagnosis not present

## 2021-11-29 DIAGNOSIS — F1010 Alcohol abuse, uncomplicated: Secondary | ICD-10-CM

## 2021-11-29 DIAGNOSIS — M170 Bilateral primary osteoarthritis of knee: Secondary | ICD-10-CM | POA: Insufficient documentation

## 2021-11-29 DIAGNOSIS — J432 Centrilobular emphysema: Secondary | ICD-10-CM

## 2021-11-29 DIAGNOSIS — J410 Simple chronic bronchitis: Secondary | ICD-10-CM

## 2021-11-29 DIAGNOSIS — Z0000 Encounter for general adult medical examination without abnormal findings: Secondary | ICD-10-CM | POA: Diagnosis not present

## 2021-11-29 NOTE — Assessment & Plan Note (Signed)
Referral to orthopedics for some instability in the knees which has caused several almost falls.

## 2021-11-29 NOTE — Assessment & Plan Note (Signed)
Stable use currently and does not feel he needs to cut back at this time.

## 2021-11-29 NOTE — Assessment & Plan Note (Signed)
Flu shot will get later this season. Covid-19 counseled. Pneumonia up to date. Shingrix counseled to get at pharmacy. Tetanus due 2025. Colonoscopy due 2029. Counseled about sun safety and mole surveillance. Counseled about the dangers of distracted driving. Given 10 year screening recommendations.

## 2021-11-29 NOTE — Assessment & Plan Note (Signed)
Taking statin and needs to continue.

## 2021-11-29 NOTE — Assessment & Plan Note (Signed)
No flare today. Using stiolto and still smoking. Albuterol prn. Continue for now. Counseled to quit smoking and did not desire that for now.

## 2021-11-29 NOTE — Progress Notes (Signed)
Subjective:   Patient ID: [name], male    DOB: 01/10/1951, 71 y.o.   MRN: [id number]  HPI Here for medicare wellness and physical, no new complaints. Please see A/P for status and treatment of chronic medical problems.   Diet: heart healthy  Physical activity: sedentary Depression/mood screen: negative Hearing: intact to whispered voice Visual acuity: grossly normal with glasses, performs annual eye exam  ADLs: capable Fall risk: none Home safety: good Cognitive evaluation: intact to orientation, naming, recall and repetition EOL planning: adv directives discussed, in place  [employer] Visit from 11/29/2021 in [place] at [place] Visit from 11/29/2021 in [place] at [facility]  PHQ-9 Total Score 0         05/15/2020    2:00 PM 05/15/2020    9:40 PM 05/16/2020   10:00 AM 06/21/2020    8:02 AM 11/29/2021    9:50 AM  [place] in the past year?    0 0  Was there an injury with Fall?    0 0  Fall Risk Category Calculator    0 0  Fall Risk Category    Low Low  Patient Fall Risk Level Low fall risk Low fall risk Moderate fall risk  Low fall risk    I have personally reviewed and have noted 1. The patient's medical and social history - reviewed today no changes 2. Their use of alcohol, tobacco or illicit drugs 3. Their current medications and supplements 4. The patient's functional ability including ADL's, fall risks, home safety risks and hearing or visual impairment. 5. Diet and physical activities 6. Evidence for depression or mood disorders 7. Care team reviewed and updated 8.  The patient is not on an opioid pain medication.  Patient Care Team: Hoyt Koch, MD as PCP - General (Internal Medicine) Jerline Pain, MD as PCP - Cardiology (Cardiology) Alexander Mt (Inactive) as Physician Assistant (Neurology) Michael Boston, MD as Attending Physician (General  Surgery) Jerline Pain, MD as Consulting Physician (Cardiology) Charlton Haws, [facility] as Pharmacist (Pharmacist) Past Medical History:  Diagnosis Date   Adenomatous colon polyp    Alcohol use    Antiphospholipid antibody positive    ???In the past???but not proven according to patient   Antiphospholipid antibody positive    ??? in the past, but not proven according to the patient.   Atrial flutter ([place])    atrial flutter ablation,   Dr.Allred October 18, 2010   BPH (benign prostatic hypertrophy)    CAD (coronary artery disease)    Catheterization 2005 disease / catheterization July 06, 2010, chronic total occlusion distal RCA with collaterals from right and left side.  Medical therapy recommended, mild decreased LV function        Carotid artery disease (HCC)    1-69% R. ICA, 67-89% LICA... Doppler.... January, 2010  /  Doppler... in January, 2011 no change  /  doppler1/27/2012...stable  3-81% RICA 01-75% LICA   CHF (congestive heart failure) (HCC)    COPD (chronic obstructive pulmonary disease) (HCC)    CRAO (central retinal artery occlusion)    Ejection fraction     EF 50-55%, echo, March, 2012... inferobasal and distal septal hypokinesis /      60%, echo, November, 2008   GERD (gastroesophageal reflux disease)    Hypercholesterolemia    Hypertension    Myocardial infarction ([place])  Nonsustained ventricular tachycardia (HCC)    Palpitations    probable SVT, rate 170,, at home,, lasted one hour,, 2012   Prostate cancer ([place])    Raynaud's syndrome    Stroke ([place])    mild stroke per pt    TIA (transient ischemic attack)    Possible small vessel tia in the past.   Tobacco abuse    Past Surgical History:  Procedure Laterality Date   CARDIAC CATHETERIZATION  06/27/2003   2005.... mild irregularity of the LAD..( patient had had abnormal Myoview scan)   CARDIOVERSION N/A 03/06/2016   Procedure: CARDIOVERSION;  Surgeon: Josue Hector, MD;  Location: [place];  Service:  Cardiovascular;  Laterality: N/A;   INGUINAL HERNIA REPAIR  2009   right with mesh.  Dr Marlou Starks   TEE WITHOUT CARDIOVERSION N/A 03/06/2016   Procedure: TRANSESOPHAGEAL ECHOCARDIOGRAM (TEE);  Surgeon: Josue Hector, MD;  Location: [place];  Service: Cardiovascular;  Laterality: N/A;   THYROGLOSSAL DUCT CYST  2006   Dr. Wilburn Cornelia   TRANSURETHRAL RESECTION OF PROSTATE N/A 05/15/2020   Procedure: TRANSURETHRAL RESECTION OF THE PROSTATE (TURP);  Surgeon: Irine Seal, MD;  Location: [facility];  Service: Urology;  Laterality: N/A;   Family History  Adopted: Yes  Family history unknown: Yes   Review of Systems  Constitutional: Negative.   HENT: Negative.    Eyes: Negative.   Respiratory:  Positive for shortness of breath. Negative for cough and chest tightness.   Cardiovascular:  Negative for chest pain, palpitations and leg swelling.  Gastrointestinal:  Negative for abdominal distention, abdominal pain, constipation, diarrhea, nausea and vomiting.  Musculoskeletal:  Positive for arthralgias.  Skin: Negative.   Neurological: Negative.   Psychiatric/Behavioral: Negative.      Objective:  Physical Exam Constitutional:      Appearance: He is well-developed.  HENT:     Head: Normocephalic and atraumatic.  Cardiovascular:     Rate and Rhythm: Normal rate and regular rhythm.  Pulmonary:     [place]: Pulmonary effort is normal. No respiratory distress.     Breath sounds: Normal breath sounds. No wheezing or rales.  Abdominal:     General: Bowel sounds are normal. There is no distension.     Palpations: Abdomen is soft.     Tenderness: There is no abdominal tenderness. There is no rebound.  Musculoskeletal:        General: Tenderness present.     Cervical back: Normal range of motion.  Skin:    General: Skin is warm and dry.  Neurological:     Mental Status: He is alert and oriented to person, place, and time.     Coordination: Coordination normal.     Vitals:   11/29/21 0948  BP:  132/68  Pulse: 60  SpO2: 98%  Weight: 161 lb (73 kg)  Height: 6' 0.5" (1.842 m)    Assessment & Plan:

## 2021-11-29 NOTE — Patient Instructions (Addendum)
We will get you in with the orthopedic about the knee.

## 2021-11-29 NOTE — Assessment & Plan Note (Signed)
Counseled to quit smoking but he has not desire to quit at this time. Reminded about risk and harm from smoking.

## 2021-12-02 ENCOUNTER — Telehealth: Payer: Self-pay

## 2021-12-02 NOTE — Telephone Encounter (Signed)
Pharmacy is calling to get a refill on: atorvastatin (LIPITOR) 40 MG tablet  Pharmacy: [facility] - [place], [place] - [address]  LOV 11/29/21 ROV 11/30/21

## 2021-12-03 ENCOUNTER — Other Ambulatory Visit: Payer: Self-pay | Admitting: *Deleted

## 2021-12-03 DIAGNOSIS — E782 Mixed hyperlipidemia: Secondary | ICD-10-CM

## 2021-12-03 MED ORDER — ATORVASTATIN CALCIUM 40 MG PO TABS: ORAL_TABLET | ORAL | 0 refills | Status: DC

## 2021-12-03 NOTE — Telephone Encounter (Signed)
Sent!

## 2021-12-04 ENCOUNTER — Ambulatory Visit (INDEPENDENT_AMBULATORY_CARE_PROVIDER_SITE_OTHER): Payer: Medicare HMO

## 2021-12-04 ENCOUNTER — Encounter: Payer: Self-pay | Admitting: Orthopaedic Surgery

## 2021-12-04 ENCOUNTER — Ambulatory Visit: Payer: Medicare HMO | Admitting: Orthopaedic Surgery

## 2021-12-04 VITALS — BP 151/76 | HR 71 | Ht 73.0 in | Wt 165.0 lb

## 2021-12-04 DIAGNOSIS — M25561 Pain in right knee: Secondary | ICD-10-CM

## 2021-12-04 DIAGNOSIS — G8929 Other chronic pain: Secondary | ICD-10-CM | POA: Diagnosis not present

## 2021-12-04 MED ORDER — LIDOCAINE HCL 1 % IJ SOLN
0.5000 mL | INTRAMUSCULAR | Status: AC | PRN
  Administered 2021-12-04: .5 mL

## 2021-12-04 MED ORDER — BUPIVACAINE HCL 0.25 % IJ SOLN
4.0000 mL | INTRAMUSCULAR | Status: AC | PRN
  Administered 2021-12-04: 4 mL via INTRA_ARTICULAR

## 2021-12-04 MED ORDER — METHYLPREDNISOLONE ACETATE 40 MG/ML IJ SUSP
40.0000 mg | INTRAMUSCULAR | Status: AC | PRN
  Administered 2021-12-04: 40 mg via INTRA_ARTICULAR

## 2021-12-04 NOTE — Progress Notes (Addendum)
Office Visit Note   Patient: [name]           Date of Birth: 17-Feb-1951           MRN: [id number] Visit Date: 12/04/2021              Requested by: Hoyt Koch, MD [address] [place],  [place] [address] PCP: Hoyt Koch, MD   Assessment & Plan: Visit Diagnoses:  1. Chronic pain of right knee     Plan: Right knee injection performed.  We will recheck him in 6 weeks.  If he has persistent symptoms we will consider MRI imaging.  Follow-Up Instructions: Return in about 6 weeks (around 01/15/2022).   Orders:  Orders Placed This Encounter  Procedures   Large Joint Inj: R knee   XR KNEE 3 VIEW RIGHT   Meds ordered this encounter  Medications   bupivacaine (MARCAINE) 0.25 % (with pres) injection 4 mL   lidocaine (XYLOCAINE) 1 % (with pres) injection 0.5 mL   methylPREDNISolone acetate (DEPO-MEDROL) injection 40 mg      Procedures: Large Joint Inj: R knee on 12/04/2021 9:38 AM Indications: pain and joint swelling Details: 22 G 1.5 in needle, anterolateral approach  Arthrogram: No  Medications: 40 mg methylPREDNISolone acetate 40 MG/ML; 0.5 mL lidocaine 1 %; 4 mL bupivacaine 0.25 % Outcome: tolerated well, no immediate complications Procedure, treatment alternatives, risks and benefits explained, specific risks discussed. Consent was given by the patient. Immediately prior to procedure a time out was called to verify the correct patient, procedure, equipment, support staff and site/side marked as required. Patient was prepped and draped in the usual sterile fashion.       Clinical Data: No additional findings.   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  His pain is sharp sudden and then resolves rather rapidly does not not note that he has knee swelling at the end of the  day.  Patient long-term smoker hyperlipidemia.  He likes to work in the yard outside.  No problems with going up or down stairs.  Denies groin pain.  Review of Systems positive for smoking central retinal artery occlusion emphysema coronary disease subclavian artery disease all of systems noncontributory HPI.   Objective: Vital Signs: BP (!) 151/76   Pulse 71   Ht '6\' 1"'$  (1.854 m)   Wt 165 lb (74.8 kg)   BMI 21.77 kg/m   Physical Exam Constitutional:      Appearance: He is well-developed.  HENT:     Head: Normocephalic and atraumatic.     Right Ear: External ear normal.     Left Ear: External ear normal.  Eyes:     Pupils: Pupils are equal, round, and reactive to light.  Neck:     Thyroid: No thyromegaly.     Trachea: No tracheal deviation.  Cardiovascular:     Rate and Rhythm: Normal rate.  Pulmonary:     [place]: Pulmonary effort is normal.     Breath sounds: No wheezing.  Abdominal:     General: Bowel sounds are normal.     Palpations: Abdomen is soft.  Musculoskeletal:     Cervical back: Neck supple.  Skin:    General: Skin is warm and dry.     Capillary  Refill: Capillary refill takes less than 2 seconds.  Neurological:     Mental Status: He is alert and oriented to person, place, and time.  Psychiatric:        Behavior: Behavior normal.        Thought Content: Thought content normal.        Judgment: Judgment normal.     Ortho Exam no pain with hyperextension some medial joint line tenderness right knee negative on the left collateral ligaments crucial ligament exam is normal negative logroll the hips distal pulses palpable.  No knee effusion.  Lateral joint line nontender.  Specialty Comments:  No specialty comments available.  Imaging: Result Narrative  Standing AP both knees bilateral sunrise patella and lateral right knee  obtained and reviewed.  This shows mild arterial calcification.   Minimal  joint space narrowing no osteophytes.  Negative for acute  changes.   Impression: Knee x-rays negative for acute changes.     PMFS History: Patient Active Problem List   Diagnosis Date Noted   Primary osteoarthritis of both knees 11/29/2021   Aortic atherosclerosis ([place]) 04/19/2021   Hypercoagulable state due to atrial fibrillation ([place]) 04/19/2021   Orthostatic hypotension 04/19/2021   Smokers' cough ([place]) 06/22/2020   BPH with obstruction/lower urinary tract symptoms 51/70/0174   Chronic systolic heart failure ([place]) 09/22/2019   Protein in urine 02/03/2018   Centrilobular emphysema ([place]) 05/06/2016   Routine general medical examination at a health care facility 02/28/2014   Paroxysmal atrial fibrillation ([place]) 12/05/2013   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 in the past, but not proven according to the patient.   Atrial flutter ([place])    atrial flutter ablation,   Dr.Allred October 18, 2010   BPH (benign prostatic hypertrophy)    CAD (coronary artery disease)    Catheterization 2005 disease / catheterization July 06, 2010, chronic total occlusion distal RCA with collaterals from right and left side.  Medical therapy recommended, mild decreased LV function        Carotid artery disease (HCC)    9-44% R. ICA, 96-75% LICA... Doppler.... January, 2010  /  Doppler... in January, 2011 no change  /  doppler1/27/2012...stable  9-16% RICA 38-46% LICA   CHF (congestive heart failure) (HCC)    COPD (chronic obstructive pulmonary disease) (HCC)    CRAO  (central retinal artery occlusion)    Ejection fraction     EF 50-55%, echo, March, 2012... inferobasal and distal septal hypokinesis /      60%, echo, November, 2008   GERD (gastroesophageal reflux disease)    Hypercholesterolemia    Hypertension    Myocardial infarction ([place])    Nonsustained ventricular tachycardia (HCC)    Palpitations    probable SVT, rate 170,, at home,, lasted one hour,, 2012   Prostate cancer ([place])    Raynaud's syndrome    Stroke ([place])    mild stroke per pt    TIA (transient ischemic attack)  Possible small vessel tia in the past.   Tobacco abuse     Family History  Adopted: Yes  Family history unknown: Yes    Past Surgical History:  Procedure Laterality Date   CARDIAC CATHETERIZATION  06/27/2003   2005.... mild irregularity of the LAD..( patient had had abnormal Myoview scan)   CARDIOVERSION N/A 03/06/2016   Procedure: CARDIOVERSION;  Surgeon: Josue Hector, MD;  Location: [place];  Service: Cardiovascular;  Laterality: N/A;   INGUINAL HERNIA REPAIR  2009   right with mesh.  Dr Marlou Starks   TEE WITHOUT CARDIOVERSION N/A 03/06/2016   Procedure: TRANSESOPHAGEAL ECHOCARDIOGRAM (TEE);  Surgeon: Josue Hector, MD;  Location: [place];  Service: Cardiovascular;  Laterality: N/A;   THYROGLOSSAL DUCT CYST  2006   Dr. Wilburn Cornelia   TRANSURETHRAL RESECTION OF PROSTATE N/A 05/15/2020   Procedure: TRANSURETHRAL RESECTION OF THE PROSTATE (TURP);  Surgeon: Irine Seal, MD;  [facility];  Service: Urology;  Laterality: N/A;   Social History   Occupational History   Occupation: land Teacher, English as a foreign language retired  Tobacco Use   Smoking status: Every Day    Packs/day: 0.50    Years: 50.00    Total pack years: 25.00    Types: Cigarettes    Start date: 1970   Smokeless tobacco: Never   Tobacco comments:    11/30/19 .5-1pack per day  Vaping Use   Vaping Use: Never used  Substance and Sexual Activity   Alcohol use: Yes    Alcohol/week: 14.0 - 21.0 standard drinks  of alcohol    Types: 14 - 21 Cans of beer per week   Drug use: No   Sexual activity: Not Currently

## 2021-12-26 ENCOUNTER — Other Ambulatory Visit: Payer: Self-pay | Admitting: Internal Medicine

## 2022-01-01 ENCOUNTER — Other Ambulatory Visit (HOSPITAL_COMMUNITY): Payer: Self-pay | Admitting: Physician Assistant

## 2022-01-01 DIAGNOSIS — I779 Disorder of arteries and arterioles, unspecified: Secondary | ICD-10-CM

## 2022-01-15 ENCOUNTER — Encounter: Payer: Self-pay | Admitting: Orthopaedic Surgery

## 2022-01-15 ENCOUNTER — Ambulatory Visit: Payer: Medicare HMO | Admitting: Orthopaedic Surgery

## 2022-01-15 VITALS — BP 112/71 | HR 73 | Ht 74.0 in | Wt 160.0 lb

## 2022-01-15 DIAGNOSIS — G8929 Other chronic pain: Secondary | ICD-10-CM | POA: Diagnosis not present

## 2022-01-15 DIAGNOSIS — M25561 Pain in right knee: Secondary | ICD-10-CM | POA: Diagnosis not present

## 2022-01-15 NOTE — Progress Notes (Signed)
Office Visit Note   Patient: [name]           Date of Birth: [birth year]           MRN: [id number] Visit Date: 01/15/2022              Requested by: Hoyt Koch, MD [address] [place],  [place] [address] PCP: Hoyt Koch, MD   Assessment & Plan: Visit Diagnoses: Acute pain right knee.    Plan: Patient's x-rays demonstrate some joint space narrowing mild.  We will proceed with an MRI for his repetitive popping.  He may have a cartilage flap tear or meniscal tear which is persistently symptomatic failed anti-inflammatories activity modification and intra-articular injection.  Follow-Up Instructions: No follow-ups on file.   Orders:  Orders Placed This Encounter  Procedures   MR Knee Right w/o contrast   No orders of the defined types were placed in this encounter.     Procedures: No procedures performed   Clinical Data: No additional findings.   Subjective: Chief Complaint  Patient presents with   Right Knee - Follow-up, Pain    HPI 71 year old male returns for ongoing problems with his right knee.  He continues to have popping which is painful in his knee primarily on the medial aspect.  Previous intra-articular injection performed on 12/04/2021 did not give him relief more than a few days.  Knee pops 5-7 times a day and at times he states he is turned he may fall.  He has not had any true locking.  It does not pop when he is supine.  Review of Systems all other systems noncontributory to HPI.  Positive for smoking.   Objective: Vital Signs: BP 112/71   Pulse 73   Ht '6\' 2"'$  (1.88 m)   Wt 160 lb (72.6 kg)   BMI 20.54 kg/m   Physical Exam Constitutional:      Appearance: He is well-developed.  HENT:     Head: Normocephalic and atraumatic.     Right Ear: External ear normal.     Left Ear: External ear normal.  Eyes:     Pupils: Pupils are equal, round, and reactive to light.  Neck:     Thyroid: No thyromegaly.     Trachea:  No tracheal deviation.  Cardiovascular:     Rate and Rhythm: Normal rate.  Pulmonary:     Effort: Pulmonary effort is normal.     Breath sounds: No wheezing.  Abdominal:     General: Bowel sounds are normal.     Palpations: Abdomen is soft.  Musculoskeletal:     Cervical back: Neck supple.  Skin:    General: Skin is warm and dry.     Capillary Refill: Capillary refill takes less than 2 seconds.  Neurological:     Mental Status: He is alert and oriented to person, place, and time.  Psychiatric:        Behavior: Behavior normal.        Thought Content: Thought content normal.        Judgment: Judgment normal.     Ortho Exam patient has some medial joint line tenderness positive patellofemoral crepitus.  Negative patellar subluxation ACL PCL exam is normal.  Some medial joint line tenderness pain with hyperextension medial side.  ACL PCL normal distal pulses are palpable.  Negative logroll the hips negative operative compression test no palpable Baker's cyst.  Specialty Comments:  No specialty comments available.  Imaging: No  results found.   PMFS History: Patient Active Problem List   Diagnosis Date Noted   Pain in right knee 01/15/2022   Primary osteoarthritis of both knees 11/29/2021   Aortic atherosclerosis ([place]) 04/19/2021   Hypercoagulable state due to atrial fibrillation ([place]) 04/19/2021   Orthostatic hypotension 04/19/2021   Smokers' cough ([place]) 06/22/2020   BPH with obstruction/lower urinary tract symptoms 27/05/5007   Chronic systolic heart failure ([place]) 09/22/2019   Protein in urine 02/03/2018   Centrilobular emphysema ([place]) 05/06/2016   Routine general medical examination at a health care facility 02/28/2014   Paroxysmal atrial fibrillation ([place]) 12/05/2013   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 in the past, but not proven according to the patient.   Atrial flutter ([place])    atrial flutter ablation,   Dr.Allred October 18, 2010   BPH (benign prostatic hypertrophy)    CAD (coronary artery disease)    Catheterization 2005 disease / catheterization July 06, 2010, chronic total occlusion distal RCA with collaterals from right and left side.  Medical therapy recommended, mild decreased LV function        Carotid artery disease (HCC)    3-81% R. ICA, 82-99% LICA... Doppler.... January, 2010  /  Doppler... in January, 2011 no change  /  doppler1/27/2012...stable  3-71% RICA 69-67% LICA   CHF (congestive heart failure) (HCC)    COPD (chronic obstructive pulmonary disease) (HCC)    CRAO (central retinal artery occlusion)    Ejection fraction     EF 50-55%, echo, March, 2012... inferobasal and distal septal hypokinesis /      60%, echo, November, 2008   GERD (gastroesophageal reflux disease)    Hypercholesterolemia    Hypertension    Myocardial infarction ([place])    Nonsustained ventricular tachycardia (HCC)    Palpitations    probable SVT, rate 170,, at home,, lasted one hour,, 2012   Prostate cancer ([place])    Raynaud's syndrome    Stroke ( [place])    mild stroke per pt    TIA (transient ischemic attack)    Possible small vessel tia in the past.   Tobacco abuse     Family History  Adopted: Yes  Family history unknown: Yes    Past Surgical History:  Procedure Laterality Date   CARDIAC CATHETERIZATION  06/27/2003   2005.... mild irregularity of the LAD..( patient had had abnormal Myoview scan)   CARDIOVERSION N/A 03/06/2016   Procedure: CARDIOVERSION;   Surgeon: Josue Hector, MD;  Location: [facility];  Service: Cardiovascular;  Laterality: N/A;   INGUINAL HERNIA REPAIR  2009   right with mesh.  Dr Marlou Starks   TEE WITHOUT CARDIOVERSION N/A 03/06/2016   Procedure: TRANSESOPHAGEAL ECHOCARDIOGRAM (TEE);  Surgeon: Josue Hector, MD;  Location: [facility];  Service: Cardiovascular;  Laterality: N/A;   THYROGLOSSAL DUCT CYST  2006   Dr. Wilburn Cornelia   TRANSURETHRAL RESECTION OF PROSTATE N/A 05/15/2020   Procedure: TRANSURETHRAL RESECTION OF THE PROSTATE (TURP);  Surgeon: Irine Seal, MD;  Location: [facility];  Service: Urology;  Laterality: N/A;   Social History   Occupational History   Occupation: land Teacher, English as a foreign language retired  Tobacco Use   Smoking status: Every Day    Packs/day: 0.50    Years: 50.00    Total pack years: 25.00    Types: Cigarettes    Start date: 1970   Smokeless tobacco: Never   Tobacco comments:    11/30/19 .5-1pack per day  Vaping Use   Vaping Use: Never used  Substance and Sexual Activity   Alcohol use: Yes    Alcohol/week: 14.0 - 21.0 standard drinks of alcohol    Types: 14 - 21 Cans of beer per week   Drug use: No   Sexual activity: Not Currently

## 2022-01-27 ENCOUNTER — Other Ambulatory Visit: Payer: Self-pay | Admitting: Internal Medicine

## 2022-01-29 ENCOUNTER — Other Ambulatory Visit: Payer: Self-pay | Admitting: Internal Medicine

## 2022-02-01 ENCOUNTER — Ambulatory Visit
Admission: RE | Admit: 2022-02-01 | Discharge: 2022-02-01 | Disposition: A | Discharge status: Home / Self Care | Payer: Medicare HMO | Source: Ambulatory Visit | Attending: Orthopaedic Surgery | Admitting: Orthopaedic Surgery

## 2022-02-01 DIAGNOSIS — G8929 Other chronic pain: Secondary | ICD-10-CM

## 2022-02-01 DIAGNOSIS — M25561 Pain in right knee: Secondary | ICD-10-CM | POA: Diagnosis not present

## 2022-02-04 ENCOUNTER — Encounter: Payer: Self-pay | Admitting: Orthopaedic Surgery

## 2022-02-04 ENCOUNTER — Ambulatory Visit: Payer: Medicare HMO | Admitting: Orthopaedic Surgery

## 2022-02-04 VITALS — BP 111/68 | HR 62 | Ht 74.0 in | Wt 160.0 lb

## 2022-02-04 DIAGNOSIS — S83241D Other tear of medial meniscus, current injury, right knee, subsequent encounter: Secondary | ICD-10-CM | POA: Diagnosis not present

## 2022-02-04 DIAGNOSIS — S83241S Other tear of medial meniscus, current injury, right knee, sequela: Secondary | ICD-10-CM

## 2022-02-04 NOTE — Progress Notes (Signed)
Office Visit Note   Patient: [name]           Date of Birth: 09/16/1950           MRN: [id number] Visit Date: 02/04/2022              Requested by: Hoyt Koch, MD [address] [place],  [place] [address] PCP: Hoyt Koch, MD   Assessment & Plan: Visit Diagnoses:  1. Acute medial meniscal tear, right, sequela     Plan: We reviewed the MRI scan he has a tear of the anterior portion the medial meniscus horizontal and also complex posterior meniscal tear with some fragmentation which may be giving him his catching and grabbing sensation and intermittent locking.  We discussed outpatient arthroscopy and he states this is persistently bothered him for 3 months has not responded to topical creams, anti-inflammatories intra-articular injection or use of a cane.  He states he wants to proceed with outpatient surgery primarily due to is concerned about potential for falling and further injury.  It bothers him at night at times when he tries to sleep.  Questions were elicited and answered we discussed outpatient surgery and we need to have him see his cardiologist usually is Xarelto would be stopped 2 days before and then could be restarted the day after surgery.  He understands that he might have to return to the office if he developed hemarthrosis postop to have it drained which can occur with blood thinners.  Patient states he like to proceed.  Follow-Up Instructions: No follow-ups on file.   Orders:  No orders of the defined types were placed in this encounter.  No orders of the defined types were placed in this encounter.     Procedures: No procedures performed   Clinical Data: No additional findings.   Subjective: Chief Complaint  Patient presents with   Right Knee - Pain, Follow-up    MRI review    HPI 71 year old male returns with ongoing problems with his right knee.  Injection back in September did not give him relief.  He has had onset of  problems since he was working in his yard twisting his knee and had pains.  Since that time he had catching pain along the medial joint line of the right knee and persistent popping which is painful and grabs him.  He is grabbed objects before but has not actually fallen to the ground.  He can go up and down steps slowly and deliberately.  He states since his onset of symptoms for the last 3 months it continues to be painful and giving him problems.  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  Additionally has history of coronary disease Subclavian artery disease and emphysema.  Review of Systems all systems updated unchanged from 12/04/2021.Hx of prostate cancer.    Objective: Vital Signs: BP 111/68   Pulse 62   Ht '6\' 2"'$  (1.88 m)   Wt 160 lb (72.6 kg)   BMI 20.54 kg/m   Physical Exam Constitutional:      Appearance: He is well-developed.  HENT:     Head: Normocephalic and atraumatic.     Right Ear: External ear normal.     Left Ear: External ear normal.  Eyes:     Pupils: Pupils are equal, round, and reactive to light.  Neck:     Thyroid: No thyromegaly.  Trachea: No tracheal deviation.  Cardiovascular:     Rate and Rhythm: Normal rate.  Pulmonary:     [place]: Pulmonary effort is normal.     Breath sounds: No wheezing.  Abdominal:     General: Bowel sounds are normal.     Palpations: Abdomen is soft.  Musculoskeletal:     Cervical back: Neck supple.  Skin:    General: Skin is warm and dry.     Capillary Refill: Capillary refill takes less than 2 seconds.  Neurological:     Mental Status: He is alert and oriented to person, place, and time.  Psychiatric:        Behavior: Behavior normal.        Thought Content: Thought content normal.        Judgment: Judgment normal.     Ortho Exam positive McMurray's test right knee negative left negative anterior drawer and negative  [place]man.  Pain with hyperextension medial joint line.  No palpable Baker's cyst.  No lateral joint line tenderness.  Minimal effusion is noted right knee none on the left.  Distal pulses are 2+.  No sciatic notch tenderness.  Hip range of motion is full, negative logroll.  Specialty Comments:  No specialty comments available.  Imaging:LINICAL DATA:  Right knee pain and popping for 3 months. No known injury.   EXAM: MRI OF THE RIGHT KNEE WITHOUT CONTRAST   TECHNIQUE: Multiplanar, multisequence MR imaging of the knee was performed. No intravenous contrast was administered.   COMPARISON:  Plain films right knee 12/04/2021.   FINDINGS: MENISCI   Medial meniscus: A horizontal tear in the posterior horn reaches the meniscal undersurface and extends to the junction of the posterior horn and body. Also seen is a small appearing horizontal tear in the anterior body reaching the femoral articular surface.   Lateral meniscus:  Intact.   LIGAMENTS   Cruciates:  Intact.   Collaterals:  Intact.   CARTILAGE   Patellofemoral: Frayed and irregular, most notable along the lateral patellar facet.   Medial:  Frayed and irregular.   Lateral:  Mildly degenerated.   Joint: Trace amount of joint fluid. There is a small amount of edema and fluid in Hoffa's infrapatellar fat pad.   Popliteal Fossa:  No Baker's cyst.   Extensor Mechanism:  Intact.   Bones:  No fracture, stress change or focal lesion.   Other: None.   IMPRESSION: Horizontal tear posterior horn medial meniscus. Also seen is a small appearing horizontal tear in the anterior body of the medial meniscus.   Overall mild appearing osteoarthritis.   Small amount of fluid and edema in Hoffa's infrapatellar fat compatible with Hoffa's disease.     Electronically Signed   By: Inge Rise M.D.   On: 02/03/2022 09:31      PMFS History: Patient Active Problem List   Diagnosis Date Noted   Acute medial meniscal  tear, right, sequela 02/04/2022   Pain in right knee 01/15/2022   Primary osteoarthritis of both knees 11/29/2021   Aortic atherosclerosis ([place]) 04/19/2021   Hypercoagulable state due to atrial fibrillation ([place]) 04/19/2021   Orthostatic hypotension 04/19/2021   Smokers' cough ([place]) 06/22/2020   BPH with obstruction/lower urinary tract symptoms 29/79/8921   Chronic systolic heart failure ([place]) 09/22/2019   Protein in urine 02/03/2018   Centrilobular emphysema ([place]) 05/06/2016   Routine general medical examination at a health care facility 02/28/2014   Paroxysmal atrial fibrillation ([place]) 12/05/2013   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[place]) 04/22/2013  CAD (coronary artery disease)    Raynaud's syndrome    CRAO (central retinal artery occlusion)    Carotid artery disease ([place])    THYROID CYST 07/05/2007   Hyperlipidemia 07/05/2007   Alcohol abuse 07/05/2007   Essential hypertension 07/05/2007   GERD 07/05/2007   Past Medical History:  Diagnosis Date   Adenomatous colon polyp    Alcohol use    Antiphospholipid antibody positive    ???In the past???but not proven according to patient   Antiphospholipid antibody positive    ??? in the past, but not proven according to the patient.   Atrial flutter ([place])    atrial flutter ablation,   Dr.Allred October 18, 2010   BPH (benign prostatic hypertrophy)    CAD (coronary artery disease)    Catheterization 2005 disease / catheterization July 06, 2010, chronic total occlusion distal RCA with collaterals from right and left side.  Medical therapy recommended, mild decreased LV function        Carotid artery disease (HCC)    0-16% R. ICA, 01-09% LICA... Doppler.... January, 2010  /  Doppler... in January, 2011 no change  /  doppler1/27/2012...stable  3-23% RICA 55-73% LICA   CHF (congestive heart failure) (HCC)    COPD (chronic obstructive pulmonary disease) (HCC)    CRAO (central retinal artery occlusion)    Ejection fraction     EF 50-55%, echo,  March, 2012... inferobasal and distal septal hypokinesis /      60%, echo, November, 2008   GERD (gastroesophageal reflux disease)    Hypercholesterolemia    Hypertension    Myocardial infarction ([place])    Nonsustained ventricular tachycardia (HCC)    Palpitations    probable SVT, rate 170,, at home,, lasted one hour,, 2012   Prostate cancer ([place])    Raynaud's syndrome    Stroke ([place])    mild stroke per pt    TIA (transient ischemic attack)    Possible small vessel tia in the past.   Tobacco abuse     Family History  Adopted: Yes  Family history unknown: Yes    Past Surgical History:  Procedure Laterality Date   CARDIAC CATHETERIZATION  06/27/2003   2005.... mild irregularity of the LAD..( patient had had abnormal Myoview scan)   CARDIOVERSION N/A 03/06/2016   Procedure: CARDIOVERSION;  Surgeon: Josue Hector, MD;  Location: [facility];  Service: Cardiovascular;  Laterality: N/A;   INGUINAL HERNIA REPAIR  2009   right with mesh.  Dr Marlou Starks   TEE WITHOUT CARDIOVERSION N/A 03/06/2016   Procedure: TRANSESOPHAGEAL ECHOCARDIOGRAM (TEE);  Surgeon: Josue Hector, MD;  Location: [facility];  Service: Cardiovascular;  Laterality: N/A;   THYROGLOSSAL DUCT CYST  2006   Dr. Wilburn Cornelia   TRANSURETHRAL RESECTION OF PROSTATE N/A 05/15/2020   Procedure: TRANSURETHRAL RESECTION OF THE PROSTATE (TURP);  Surgeon: Irine Seal, MD;  [facility];  Service: Urology;  Laterality: N/A;   Social History   Occupational History   Occupation: land Teacher, English as a foreign language retired  Tobacco Use   Smoking status: Every Day    Packs/day: 0.50    Years: 50.00    Total pack years: 25.00    Types: Cigarettes    Start date: 1970   Smokeless tobacco: Never   Tobacco comments:    11/30/19 .5-1pack per day  Vaping Use   Vaping Use: Never used  Substance and Sexual Activity   Alcohol use: Yes    Alcohol/week: 14.0 - 21.0 standard drinks of alcohol  Types: 14 - 21 Cans of beer per week   Drug use: No   Sexual  activity: Not Currently

## 2022-02-11 ENCOUNTER — Telehealth: Payer: Self-pay | Admitting: *Deleted

## 2022-02-11 NOTE — Telephone Encounter (Signed)
   Pre-operative Risk Assessment    Patient Name: [name]  DOB: Apr 08, 1950 MRN: [id number]      Request for Surgical Clearance    Procedure:   RIGHT KNEE ARTHROPLASTY  Date of Surgery:  Clearance TBD                                 Surgeon:  DR. MARK YATES Surgeon's Group or Practice Name:  [facility] Phone number:  [phone] Fax number:  [phone] ATTN: SHERRIE   Type of Clearance Requested:   - Medical  - Pharmacy:  Hold Rivaroxaban (Xarelto)     Type of Anesthesia:   CHOICE   Additional requests/questions:    [name]   02/11/2022, 4:14 PM

## 2022-02-12 NOTE — Telephone Encounter (Signed)
Patient on Xarelto for PAF. Will route to pharmacy team for input.   CHA2DS2-VASc Score = 6   This indicates a 9.7% annual risk of stroke. The patient's score is based upon: CHF History: 1 HTN History: 1 Diabetes History: 0 Stroke History: 2 Vascular Disease History: 1 Age Score: 1 Gender Score: 0     Platelet count: 05/08/2021: Platelets 282   Creatinine clearance: 61 mL/min (adjusted for weight) based on 04/2021 labs.  05/08/2021: Creatinine, Ser 1.31; Hemoglobin 15.7   Lipid panel/LFTs 10/2021 in [facility] but no updated CBC/BMP.  Loel Dubonnet, NP  02/12/22  9:08 AM

## 2022-02-12 NOTE — Telephone Encounter (Signed)
Patient with diagnosis of afib on Xarelto for anticoagulation.    Procedure: RIGHT KNEE ARTHROPLASTY  Date of procedure: TBD   CHA2DS2-VASc Score = 6   This indicates a 9.7% annual risk of stroke. The patient's score is based upon: CHF History: 1 HTN History: 1 Diabetes History: 0 Stroke History: 2 Vascular Disease History: 1 Age Score: 1 Gender Score: 0      CrCl 61 ml/min Platelet count 282  Patient with a hx of stroke. Due to the need to hold Xarelto for TKA will defer to Dr. [name] to if this hold is acceptable.  **This guidance is not considered finalized until pre-operative APP has relayed final recommendations.**

## 2022-02-13 ENCOUNTER — Telehealth: Payer: Self-pay | Admitting: *Deleted

## 2022-02-13 NOTE — Telephone Encounter (Signed)
Ok to hold Xarelto for 3 days prior to right TKA.

## 2022-02-13 NOTE — Telephone Encounter (Signed)
Telephone clearance appt made.  Med rec and consent done.

## 2022-02-13 NOTE — Telephone Encounter (Signed)
   Name: [name]  DOB: 08-Mar-1951  MRN: [id number]  Primary Cardiologist: Candee Furbish, MD   Preoperative team, please contact this patient and set up a phone call appointment for further preoperative risk assessment. Please obtain consent and complete medication review. Thank you for your help.  I confirm that guidance regarding antiplatelet and oral anticoagulation therapy has been completed and, if necessary, noted below.  Pharmacy has provided recommendations on anticoagulation at home.   Deberah Pelton, NP 02/13/2022, 10:47 AM [place]

## 2022-02-13 NOTE — Telephone Encounter (Signed)
  Patient Consent for Virtual Visit         [name] has provided verbal consent on 02/13/2022 for a virtual visit (video or telephone).   CONSENT FOR VIRTUAL VISIT FOR:  [name]  By participating in this virtual visit I agree to the following:  I hereby voluntarily request, consent and authorize [facility] and its employed or contracted physicians, physician assistants, nurse practitioners or other licensed health care professionals (the Practitioner), to provide me with telemedicine health care services (the "Services") as deemed necessary by the treating Practitioner. I acknowledge and consent to receive the Services by the Practitioner via telemedicine. I understand that the telemedicine visit will involve communicating with the Practitioner through live audiovisual communication technology and the disclosure of certain medical information by electronic transmission. I acknowledge that I have been given the opportunity to request an in-person assessment or other available alternative prior to the telemedicine visit and am voluntarily participating in the telemedicine visit.  I understand that I have the right to withhold or withdraw my consent to the use of telemedicine in the course of my care at any time, without affecting my right to future care or treatment, and that the Practitioner or I may terminate the telemedicine visit at any time. I understand that I have the right to inspect all information obtained and/or recorded in the course of the telemedicine visit and may receive copies of available information for a reasonable fee.  I understand that some of the potential risks of receiving the Services via telemedicine include:  Delay or interruption in medical evaluation due to technological equipment failure or disruption; Information transmitted may not be sufficient (e.g. poor resolution of images) to allow for appropriate medical decision making by the Practitioner;  and/or  In rare instances, security protocols could fail, causing a breach of personal health information.  Furthermore, I acknowledge that it is my responsibility to provide information about my medical history, conditions and care that is complete and accurate to the best of my ability. I acknowledge that Practitioner's advice, recommendations, and/or decision may be based on factors not within their control, such as incomplete or inaccurate data provided by me or distortions of diagnostic images or specimens that may result from electronic transmissions. I understand that the practice of medicine is not an exact science and that Practitioner makes no warranties or guarantees regarding treatment outcomes. I acknowledge that a copy of this consent can be made available to me via my patient portal ([facility]), or I can request a printed copy by calling the office of [facility].    I understand that my insurance will be billed for this visit.   I have read or had this consent read to me. I understand the contents of this consent, which adequately explains the benefits and risks of the Services being provided via telemedicine.  I have been provided ample opportunity to ask questions regarding this consent and the Services and have had my questions answered to my satisfaction. I give my informed consent for the services to be provided through the use of telemedicine in my medical care

## 2022-02-14 HISTORY — PX: KNEE SURGERY: SHX244

## 2022-02-18 ENCOUNTER — Ambulatory Visit: Payer: Medicare HMO | Attending: Cardiovascular Disease | Admitting: Physician Assistant

## 2022-02-18 DIAGNOSIS — Z01810 Encounter for preprocedural cardiovascular examination: Secondary | ICD-10-CM

## 2022-02-18 NOTE — Progress Notes (Signed)
Virtual Visit via Telephone Note   Because of [name]'s co-morbid illnesses, he is at least at moderate risk for complications without adequate follow up.  This format is felt to be most appropriate for this patient at this time.  The patient did not have access to video technology/had technical difficulties with video requiring transitioning to audio format only (telephone).  All issues noted in this document were discussed and addressed.  No physical exam could be performed with this format.  Please refer to the patient's chart for his consent to telehealth for [facility].  Evaluation Performed:  Preoperative cardiovascular risk assessment _____________   Date:  02/18/2022   Patient ID:  [name], DOB November 27, 1950, MRN [id number] Patient Location:  Home Provider location:   Office  Primary Care Provider:  Hoyt Koch, MD Primary Cardiologist:  Candee Furbish, MD  Chief Complaint / Patient Profile   71 y.o. y/o male with a h/o paroxysmal atrial fibrillation, atrial flutter ablation 0814, chronic systolic heart failure with resolution of LV dysfunction, alcohol use, CAD (catheterization 2012 showed chronic total occlusion of the distal RCA with collaterals from right to left, medical therapy recommended), carotid artery disease (1-39% BICA, >50% RECA managed medically 11/2021), CRAO, NSVT, Raynaud's syndrome COPD, hypertension, hyperlipidemia, palpitations, stroke, tobacco abuse who is pending right knee arthroplasty and presents today for telephonic preoperative cardiovascular risk assessment.  History of Present Illness    [name] is a 71 y.o. male who presents via audio/video conferencing for a telehealth visit today.  Pt was last seen in cardiology clinic on 11/11/21 by Nicholes Rough PA-C.  At that time [name] was doing well. He did report some shortness of breath so updated surveillance carotid and echo were completed. Carotid showed 1-39% BICA, >50%  RECA managed medically.  Echo showed normal EF 60-65% without significant abnormalities. The patient is now pending procedure as outlined above. Since his last visit, he reports he has been doing well without any new chest pain or SOB. He reports his SOB has improved and he has been able to push mow grass and remain active outside without any new symptoms.  Past Medical History    Past Medical History:  Diagnosis Date   Adenomatous colon polyp    Alcohol use    Antiphospholipid antibody positive    ???In the past???but not proven according to patient   Antiphospholipid antibody positive    ??? in the past, but not proven according to the patient.   Atrial flutter ([place])    atrial flutter ablation,   Dr.Allred October 18, 2010   BPH (benign prostatic hypertrophy)    CAD (coronary artery disease)    Catheterization 2005 disease / catheterization July 06, 2010, chronic total occlusion distal RCA with collaterals from right and left side.  Medical therapy recommended, mild decreased LV function        Carotid artery disease (HCC)    4-81% R. ICA, 85-63% LICA... Doppler.... January, 2010  /  Doppler... in January, 2011 no change  /  doppler1/27/2012...stable  1-49% RICA 70-26% LICA   CHF (congestive heart failure) (HCC)    COPD (chronic obstructive pulmonary disease) (HCC)    CRAO (central retinal artery occlusion)    Ejection fraction     EF 50-55%, echo, March, 2012... inferobasal and distal septal hypokinesis /      60%, echo, November, 2008   GERD (gastroesophageal reflux disease)    Hypercholesterolemia    Hypertension  Myocardial infarction ([place])    Nonsustained ventricular tachycardia (HCC)    Palpitations    probable SVT, rate 170,, at home,, lasted one hour,, 2012   Prostate cancer ([place])    Raynaud's syndrome    Stroke ([place])    mild stroke per pt    TIA (transient ischemic attack)    Possible small vessel tia in the past.   Tobacco abuse    Past Surgical History:   Procedure Laterality Date   CARDIAC CATHETERIZATION  06/27/2003   2005.... mild irregularity of the LAD..( patient had had abnormal Myoview scan)   CARDIOVERSION N/A 03/06/2016   Procedure: CARDIOVERSION;  Surgeon: Josue Hector, MD;  Location: [place];  Service: Cardiovascular;  Laterality: N/A;   INGUINAL HERNIA REPAIR  2009   right with mesh.  Dr Marlou Starks   TEE WITHOUT CARDIOVERSION N/A 03/06/2016   Procedure: TRANSESOPHAGEAL ECHOCARDIOGRAM (TEE);  Surgeon: Josue Hector, MD;  Location: [place];  Service: Cardiovascular;  Laterality: N/A;   THYROGLOSSAL DUCT CYST  2006   Dr. Wilburn Cornelia   TRANSURETHRAL RESECTION OF PROSTATE N/A 05/15/2020   Procedure: TRANSURETHRAL RESECTION OF THE PROSTATE (TURP);  Surgeon: Irine Seal, MD;  [facility];  Service: Urology;  Laterality: N/A;    Allergies  Allergies  Allergen Reactions   Cialis [Tadalafil] Other (See Comments)    History of stroke    Levitra [Vardenafil] Other (See Comments)    History of stroke    Viagra [Sildenafil Citrate] Other (See Comments)    History of stroke     Home Medications    Prior to Admission medications   Medication Sig Start Date End Date Taking? Authorizing Provider  albuterol (VENTOLIN HFA) 108 (90 Base) MCG/ACT inhaler INHALE TWO PUFFS BY MOUTH INTO LUNGS EVERY 4 HOURS AS NEEDED FOR WHEEZING AND/OR SHORTNESS OF BREATH 12/03/21   Hoyt Koch, MD  amiodarone (PACERONE) 200 MG tablet TAKE 1/2 TABLET BY MOUTH ONCE DAILY 10/24/21   Jerline Pain, MD  atorvastatin (LIPITOR) 40 MG tablet Take one tablet by mouth once daily 12/03/21   Hoyt Koch, MD  b complex vitamins capsule Take 1 capsule by mouth daily.    [provider]  lisinopril (ZESTRIL) 10 MG tablet TAKE ONE TABLET BY MOUTH ONCE DAILY 01/30/22   Hoyt Koch, MD  metoprolol succinate (TOPROL-XL) 50 MG 24 hr tablet Take 0.5 tablets (25 mg total) by mouth daily. Take with or immediately following a meal.  10/30/21   Jerline Pain, MD  Multiple Vitamin (MULTIVITAMIN WITH MINERALS) TABS tablet Take 1 tablet by mouth daily. 03/08/16   Mikhail, Velta Addison, DO  rivaroxaban (XARELTO) 20 MG TABS tablet TAKE ONE TABLET BY MOUTH ONCE DAILY 05/09/21   Jerline Pain, MD  Tiotropium Bromide-Olodaterol (STIOLTO RESPIMAT) 2.5-2.5 MCG/ACT AERS INHALE TWO PUFFS BY MOUTH INTO LUNGS ONCE DAILY Needs appointment for further refills 01/27/22   Hoyt Koch, MD    Physical Exam    Vital Signs:  [name] does not have vital signs available for review today.  Given telephonic nature of communication, physical exam is limited. AAOx3. NAD. Normal affect.  Speech and respirations are unlabored.  Accessory Clinical Findings    None  Assessment & Plan    1.  Preoperative Cardiovascular Risk Assessment: RCRI >11% indicating patient is a higher risk for CV complications. However, the patient affirms he has been doing well without any new cardiac symptoms. They are able to achieve 7.59 METS without cardiac  limitations. Therefore, based on ACC/AHA guidelines, the patient would be at acceptable risk for the planned procedure without further cardiovascular testing. The patient was advised that if he develops new symptoms prior to surgery to contact our office to arrange for a follow-up visit, and he verbalized understanding.  Per pharmD review with Dr. [name], [facility] to hold Xarelto for 3 days prior to right TKA.  A copy of this note will be routed to requesting surgeon.  Time:   Today, I have spent 5 minutes with the patient with telehealth technology discussing medical history, symptoms, and management plan.     Charlie Pitter, PA-C  02/18/2022, 9:45 AM

## 2022-02-24 ENCOUNTER — Encounter: Payer: Self-pay | Admitting: Orthopaedic Surgery

## 2022-02-24 ENCOUNTER — Other Ambulatory Visit: Payer: Self-pay | Admitting: Orthopaedic Surgery

## 2022-02-24 DIAGNOSIS — M2241 Chondromalacia patellae, right knee: Secondary | ICD-10-CM | POA: Diagnosis not present

## 2022-02-24 DIAGNOSIS — M23231 Derangement of other medial meniscus due to old tear or injury, right knee: Secondary | ICD-10-CM | POA: Diagnosis not present

## 2022-02-24 DIAGNOSIS — M948X6 Other specified disorders of cartilage, lower leg: Secondary | ICD-10-CM | POA: Diagnosis not present

## 2022-02-24 DIAGNOSIS — G8918 Other acute postprocedural pain: Secondary | ICD-10-CM | POA: Diagnosis not present

## 2022-02-24 MED ORDER — OXYCODONE-ACETAMINOPHEN 5-325 MG PO TABS: 1.0000 | ORAL_TABLET | Freq: Four times a day (QID) | ORAL | 0 refills | Status: DC | PRN

## 2022-03-04 ENCOUNTER — Ambulatory Visit (INDEPENDENT_AMBULATORY_CARE_PROVIDER_SITE_OTHER): Payer: Medicare HMO | Admitting: Orthopaedic Surgery

## 2022-03-04 ENCOUNTER — Encounter: Payer: Self-pay | Admitting: Orthopaedic Surgery

## 2022-03-04 VITALS — BP 109/66 | HR 77 | Ht 74.0 in | Wt 160.0 lb

## 2022-03-04 DIAGNOSIS — S83241S Other tear of medial meniscus, current injury, right knee, sequela: Secondary | ICD-10-CM

## 2022-03-04 NOTE — Progress Notes (Signed)
Post-Op Visit Note   Patient: [name]           Date of Birth: [birth year]           MRN: [id number] Visit Date: 03/04/2022 PCP: Hoyt Koch, MD   Assessment & Plan: Follow-up knee arthroscopy right with partial medial meniscectomy occasionally has a little bit of clicking and some chondral wear which was trimmed.  No locking swelling down Steri-Strips changed.  He is only taking Tylenol I will check him again in 1 month for final visit.  Chief Complaint:  Chief Complaint  Patient presents with   Right Knee - Routine Post Op    02/24/2022 right knee arthroscopy   Visit Diagnoses:  1. Acute medial meniscal tear, right, sequela     Plan: ROV 1 month  Follow-Up Instructions: Return in about 1 month (around 04/04/2022).   Orders:  No orders of the defined types were placed in this encounter.  No orders of the defined types were placed in this encounter.   Imaging: No results found.  PMFS History: Patient Active Problem List   Diagnosis Date Noted   Acute medial meniscal tear, right, sequela 02/04/2022   Pain in right knee 01/15/2022   Primary osteoarthritis of both knees 11/29/2021   Aortic atherosclerosis ([place]) 04/19/2021   Hypercoagulable state due to atrial fibrillation ([place]) 04/19/2021   Orthostatic hypotension 04/19/2021   Smokers' cough ([place]) 06/22/2020   BPH with obstruction/lower urinary tract symptoms 01/11/2535   Chronic systolic heart failure ([place]) 09/22/2019   Protein in urine 02/03/2018   Centrilobular emphysema ([place]) 05/06/2016   Routine general medical examination at a health care facility 02/28/2014   Paroxysmal atrial fibrillation ([place]) 12/05/2013   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 in the past, but not proven according to the patient.   Atrial flutter ([place])    atrial flutter ablation,   Dr.Allred October 18, 2010   BPH (benign prostatic hypertrophy)    CAD (coronary artery disease)    Catheterization 2005 disease / catheterization July 06, 2010, chronic total occlusion distal RCA with collaterals from right and left side.  Medical therapy recommended, mild decreased LV function        Carotid artery disease (HCC)    6-44% R. ICA, 03-47% LICA... Doppler.... January, 2010  /  Doppler... in January, 2011 no change  /  doppler1/27/2012...stable  4-25% RICA 95-63% LICA   CHF (congestive heart failure) (HCC)    COPD (chronic obstructive pulmonary disease) (HCC)    CRAO (central retinal artery occlusion)    Ejection fraction     EF 50-55%, echo, March, 2012... inferobasal and distal septal hypokinesis /      60%, echo, November, 2008   GERD (gastroesophageal reflux disease)    Hypercholesterolemia    Hypertension    Myocardial infarction ([place])    Nonsustained ventricular tachycardia (HCC)    Palpitations    probable SVT, rate 170,, at home,,  lasted one hour,, 2012   Prostate cancer ([place])    Raynaud's syndrome    Stroke ([place])    mild stroke per pt    TIA (transient ischemic attack)    Possible small vessel tia in the past.   Tobacco abuse     Family History  Adopted: Yes  Family history unknown: Yes    Past Surgical History:  Procedure Laterality Date   CARDIAC CATHETERIZATION  06/27/2003   2005.... mild irregularity of the LAD..( patient had had abnormal Myoview scan)   CARDIOVERSION N/A 03/06/2016   Procedure: CARDIOVERSION;  Surgeon: Josue Hector, MD;  Location: [place];  Service: Cardiovascular;   Laterality: N/A;   INGUINAL HERNIA REPAIR  2009   right with mesh.  Dr Marlou Starks   TEE WITHOUT CARDIOVERSION N/A 03/06/2016   Procedure: TRANSESOPHAGEAL ECHOCARDIOGRAM (TEE);  Surgeon: Josue Hector, MD;  Location: [place];  Service: Cardiovascular;  Laterality: N/A;   THYROGLOSSAL DUCT CYST  2006   Dr. Wilburn Cornelia   TRANSURETHRAL RESECTION OF PROSTATE N/A 05/15/2020   Procedure: TRANSURETHRAL RESECTION OF THE PROSTATE (TURP);  Surgeon: Irine Seal, MD;  [facility];  Service: Urology;  Laterality: N/A;   Social History   Occupational History   Occupation: land Teacher, English as a foreign language retired  Tobacco Use   Smoking status: Every Day    Packs/day: 0.50    Years: 50.00    Total pack years: 25.00    Types: Cigarettes    Start date: 1970   Smokeless tobacco: Never   Tobacco comments:    11/30/19 .5-1pack per day  Vaping Use   Vaping Use: Never used  Substance and Sexual Activity   Alcohol use: Yes    Alcohol/week: 14.0 - 21.0 standard drinks of alcohol    Types: 14 - 21 Cans of beer per week   Drug use: No   Sexual activity: Not Currently

## 2022-03-05 DIAGNOSIS — C61 Malignant neoplasm of prostate: Secondary | ICD-10-CM | POA: Diagnosis not present

## 2022-03-12 DIAGNOSIS — R972 Elevated prostate specific antigen [PSA]: Secondary | ICD-10-CM | POA: Diagnosis not present

## 2022-03-12 DIAGNOSIS — C61 Malignant neoplasm of prostate: Secondary | ICD-10-CM | POA: Diagnosis not present

## 2022-03-12 DIAGNOSIS — N3941 Urge incontinence: Secondary | ICD-10-CM | POA: Diagnosis not present

## 2022-03-12 DIAGNOSIS — N401 Enlarged prostate with lower urinary tract symptoms: Secondary | ICD-10-CM | POA: Diagnosis not present

## 2022-03-12 DIAGNOSIS — R351 Nocturia: Secondary | ICD-10-CM | POA: Diagnosis not present

## 2022-03-13 ENCOUNTER — Other Ambulatory Visit: Payer: Medicare HMO

## 2022-03-14 ENCOUNTER — Ambulatory Visit
Admission: RE | Admit: 2022-03-14 | Discharge: 2022-03-14 | Disposition: A | Discharge status: Home / Self Care | Payer: Medicare HMO | Source: Ambulatory Visit | Attending: Acute Care | Admitting: Acute Care

## 2022-03-14 DIAGNOSIS — F17210 Nicotine dependence, cigarettes, uncomplicated: Secondary | ICD-10-CM | POA: Diagnosis not present

## 2022-03-14 DIAGNOSIS — I2510 Atherosclerotic heart disease of native coronary artery without angina pectoris: Secondary | ICD-10-CM | POA: Diagnosis not present

## 2022-03-14 DIAGNOSIS — J432 Centrilobular emphysema: Secondary | ICD-10-CM | POA: Diagnosis not present

## 2022-03-14 DIAGNOSIS — J929 Pleural plaque without asbestos: Secondary | ICD-10-CM | POA: Diagnosis not present

## 2022-03-14 DIAGNOSIS — Z87891 Personal history of nicotine dependence: Secondary | ICD-10-CM

## 2022-03-18 ENCOUNTER — Telehealth: Payer: Self-pay | Admitting: Acute Care

## 2022-03-18 NOTE — Telephone Encounter (Signed)
Sarah please review and advise on pt's LCS CT results. Thank you   CLINICAL DATA:  53 pack-year smoking history/current smoker   EXAM: CT CHEST WITHOUT CONTRAST LOW-DOSE FOR LUNG CANCER SCREENING   TECHNIQUE: Multidetector CT imaging of the chest was performed following the standard protocol without IV contrast.   RADIATION DOSE REDUCTION: This exam was performed according to the departmental dose-optimization program which includes automated exposure control, adjustment of the mA and/or kV according to patient size and/or use of iterative reconstruction technique.   COMPARISON:  03/13/2021   FINDINGS: Cardiovascular: Aortic atherosclerosis. Normal heart size, without pericardial effusion. Three vessel coronary artery calcification.   Mediastinum/Nodes: No mediastinal or hilar adenopathy, given limitations of unenhanced CT.   Lungs/Pleura: No pleural fluid. Moderate centrilobular emphysema. Lower lobe predominant bronchial wall thickening. A right middle lobe pulmonary nodule has enlarged at volume derived equivalent diameter 6.2 mm versus volume derived equivalent diameter 4.3 mm. Example image 237 of series 3. Other pulmonary nodules are similar at maximum volume derived equivalent diameter 4.2 mm.   Upper Abdomen: Normal imaged portions of the liver, spleen, stomach, pancreas, adrenal glands, kidneys.   Musculoskeletal: No acute osseous abnormality.   IMPRESSION: 1. Lung-RADS 4A, suspicious. Follow up low-dose chest CT without contrast in 3 months (please use the following order, "CT CHEST LCS NODULE FOLLOW-UP W/O CM") is recommended. Alternatively, PET may be considered when there is a solid component 54m or larger. Enlarging right middle lobe pulmonary nodule. 2. Aortic atherosclerosis (ICD10-I70.0), coronary artery atherosclerosis and emphysema (ICD10-J43.9).   These results will be called to the ordering clinician or representative by the Radiologist Assistant,  and communication documented in the PACS or [employer]

## 2022-03-19 ENCOUNTER — Telehealth: Payer: Self-pay | Admitting: Acute Care

## 2022-03-19 DIAGNOSIS — L905 Scar conditions and fibrosis of skin: Secondary | ICD-10-CM | POA: Diagnosis not present

## 2022-03-19 DIAGNOSIS — L570 Actinic keratosis: Secondary | ICD-10-CM | POA: Diagnosis not present

## 2022-03-19 DIAGNOSIS — D485 Neoplasm of uncertain behavior of skin: Secondary | ICD-10-CM | POA: Diagnosis not present

## 2022-03-19 DIAGNOSIS — R911 Solitary pulmonary nodule: Secondary | ICD-10-CM

## 2022-03-19 DIAGNOSIS — Z85828 Personal history of other malignant neoplasm of skin: Secondary | ICD-10-CM | POA: Diagnosis not present

## 2022-03-19 DIAGNOSIS — F17210 Nicotine dependence, cigarettes, uncomplicated: Secondary | ICD-10-CM

## 2022-03-19 DIAGNOSIS — L720 Epidermal cyst: Secondary | ICD-10-CM | POA: Diagnosis not present

## 2022-03-19 NOTE — Telephone Encounter (Signed)
I have called the patient with the results of their low-dose screening CT.  Patient's scan was read as a lung RADS 4A, 66-monthfollow-up.  There has been interval enlargement and a right middle lobe nodule that is currently measuring 6.2 mm however measured 4.3 mm 12 months ago.  Plan will be for a 329-monthow-dose screening CT follow-up scan to reevaluate the nodule for stability. Denise please fax results to PCP, order 3-81-monthllow-up low-dose screening CT. Patient does have notation of aortic atherosclerosis coronary artery atherosclerosis and emphysema.  The patient is currently on statin therapy. Patient is a current every day smoker, and smoking cessation was encouraged

## 2022-03-20 NOTE — Telephone Encounter (Signed)
LDCT results/plan faxed to PCP. New order placed for LCS LDCT for nodule follow up March 2024.

## 2022-03-28 ENCOUNTER — Encounter: Payer: Self-pay | Admitting: Internal Medicine

## 2022-04-02 ENCOUNTER — Ambulatory Visit (INDEPENDENT_AMBULATORY_CARE_PROVIDER_SITE_OTHER): Payer: Medicare HMO | Admitting: Orthopaedic Surgery

## 2022-04-02 ENCOUNTER — Encounter: Payer: Self-pay | Admitting: Orthopaedic Surgery

## 2022-04-02 VITALS — BP 116/71 | HR 62 | Ht 74.0 in | Wt 160.0 lb

## 2022-04-02 DIAGNOSIS — S83241S Other tear of medial meniscus, current injury, right knee, sequela: Secondary | ICD-10-CM

## 2022-04-02 NOTE — Progress Notes (Signed)
Post-Op Visit Note   Patient: [name]           Date of Birth: [birth year]           MRN: [id number] Visit Date: 04/02/2022 PCP: Hoyt Koch, MD   Assessment & Plan: Follow-up knee arthroscopy posterior meniscal tear and chondroplasty medial compartment.  His x-ray showed minimal joint space narrowing.  He still has some popping which bothers him slightly but his pain is improved from the arthroscopy.  Swelling is down.  He can use some anti-inflammatory or Aspercreme if he is going to be more active.  We reviewed findings at the time of surgery and he can follow-up with me on an as-needed basis.  Chief Complaint:  Chief Complaint  Patient presents with   Right Knee - Routine Post Op   Visit Diagnoses:  1. Acute medial meniscal tear, right, sequela     Plan: return PRN   Follow-Up Instructions: Return if symptoms worsen or fail to improve.   Orders:  No orders of the defined types were placed in this encounter.  No orders of the defined types were placed in this encounter.   Imaging: No results found.  PMFS History: Patient Active Problem List   Diagnosis Date Noted   Acute medial meniscal tear, right, sequela 02/04/2022   Pain in right knee 01/15/2022   Primary osteoarthritis of both knees 11/29/2021   Aortic atherosclerosis ([place]) 04/19/2021   Hypercoagulable state due to atrial fibrillation ([place]) 04/19/2021   Orthostatic hypotension 04/19/2021   Smokers' cough ([place]) 06/22/2020   BPH with obstruction/lower urinary tract symptoms 15/40/0867   Chronic systolic heart failure ([place]) 09/22/2019   Protein in urine 02/03/2018   Centrilobular emphysema ([place]) 05/06/2016   Routine general medical examination at a health care facility 02/28/2014   Paroxysmal atrial fibrillation ([place]) 12/05/2013   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 in the past, but not proven according to the patient.   Atrial flutter ([place])    atrial flutter ablation,   Dr.Allred October 18, 2010   BPH (benign prostatic hypertrophy)    CAD (coronary artery disease)    Catheterization 2005 disease / catheterization July 06, 2010, chronic total occlusion distal RCA with collaterals from right and left side.  Medical therapy recommended, mild decreased LV function        Carotid artery disease (HCC)    6-19% R. ICA, 50-93% LICA... Doppler.... January, 2010  /  Doppler... in January, 2011 no change  /  doppler1/27/2012...stable  2-67% RICA 12-45% LICA   CHF (congestive heart failure) (HCC)    COPD (chronic obstructive pulmonary disease) (HCC)    CRAO (central retinal artery occlusion)    Ejection fraction     EF 50-55%, echo, March, 2012... inferobasal and distal septal hypokinesis /      60%, echo, November, 2008   GERD (gastroesophageal reflux disease)    Hypercholesterolemia    Hypertension  Myocardial infarction ([place])    Nonsustained ventricular tachycardia (HCC)    Palpitations    probable SVT, rate 170,, at home,, lasted one hour,, 2012   Prostate cancer ([place])    Raynaud's syndrome    Stroke ([place])    mild stroke per pt    TIA (transient ischemic attack)    Possible small vessel tia in the past.   Tobacco abuse     Family History  Adopted: Yes  Family history unknown: Yes    Past Surgical History:  Procedure Laterality Date   CARDIAC CATHETERIZATION  06/27/2003   2005.... mild irregularity of the LAD..( patient had had abnormal Myoview scan)   CARDIOVERSION N/A  03/06/2016   Procedure: CARDIOVERSION;  Surgeon: Josue Hector, MD;  Location: [place];  Service: Cardiovascular;  Laterality: N/A;   INGUINAL HERNIA REPAIR  2009   right with mesh.  Dr Marlou Starks   TEE WITHOUT CARDIOVERSION N/A 03/06/2016   Procedure: TRANSESOPHAGEAL ECHOCARDIOGRAM (TEE);  Surgeon: Josue Hector, MD;  Location: [place];  Service: Cardiovascular;  Laterality: N/A;   THYROGLOSSAL DUCT CYST  2006   Dr. Wilburn Cornelia   TRANSURETHRAL RESECTION OF PROSTATE N/A 05/15/2020   Procedure: TRANSURETHRAL RESECTION OF THE PROSTATE (TURP);  Surgeon: Irine Seal, MD;  Location[facility];  Service: Urology;  Laterality: N/A;   Social History   Occupational History   Occupation: land Teacher, English as a foreign language retired  Tobacco Use   Smoking status: Every Day    Packs/day: 0.50    Years: 50.00    Total pack years: 25.00    Types: Cigarettes    Start date: 1970   Smokeless tobacco: Never   Tobacco comments:    11/30/19 .5-1pack per day  Vaping Use   Vaping Use: Never used  Substance and Sexual Activity   Alcohol use: Yes    Alcohol/week: 14.0 - 21.0 standard drinks of alcohol    Types: 14 - 21 Cans of beer per week   Drug use: No   Sexual activity: Not Currently

## 2022-04-10 ENCOUNTER — Other Ambulatory Visit: Payer: Self-pay | Admitting: Internal Medicine

## 2022-04-10 DIAGNOSIS — J410 Simple chronic bronchitis: Secondary | ICD-10-CM

## 2022-04-19 ENCOUNTER — Other Ambulatory Visit: Payer: Self-pay | Admitting: Cardiology

## 2022-05-05 ENCOUNTER — Other Ambulatory Visit: Payer: Self-pay | Admitting: Internal Medicine

## 2022-05-13 ENCOUNTER — Ambulatory Visit: Payer: Medicare HMO | Attending: Cardiology | Admitting: Cardiology

## 2022-05-13 VITALS — BP 92/46 | HR 62 | Ht 74.0 in | Wt 165.0 lb

## 2022-05-13 DIAGNOSIS — D6869 Other thrombophilia: Secondary | ICD-10-CM | POA: Diagnosis not present

## 2022-05-13 DIAGNOSIS — I5022 Chronic systolic (congestive) heart failure: Secondary | ICD-10-CM

## 2022-05-13 DIAGNOSIS — I10 Essential (primary) hypertension: Secondary | ICD-10-CM

## 2022-05-13 DIAGNOSIS — I2510 Atherosclerotic heart disease of native coronary artery without angina pectoris: Secondary | ICD-10-CM | POA: Diagnosis not present

## 2022-05-13 DIAGNOSIS — Z79899 Other long term (current) drug therapy: Secondary | ICD-10-CM | POA: Diagnosis not present

## 2022-05-13 DIAGNOSIS — I480 Paroxysmal atrial fibrillation: Secondary | ICD-10-CM | POA: Diagnosis not present

## 2022-05-13 DIAGNOSIS — I4891 Unspecified atrial fibrillation: Secondary | ICD-10-CM | POA: Diagnosis not present

## 2022-05-13 MED ORDER — METOPROLOL SUCCINATE ER 25 MG PO TB24: 25.0000 mg | ORAL_TABLET | Freq: Every day | ORAL | 3 refills | Status: DC

## 2022-05-13 MED ORDER — LISINOPRIL 5 MG PO TABS: 5.0000 mg | ORAL_TABLET | Freq: Every day | ORAL | 3 refills | Status: DC

## 2022-05-13 NOTE — Progress Notes (Signed)
Cardiology Office Note:    Date:  05/13/2022   ID:  [name], DOB 1950-12-08, MRN [id number]  PCP:  Hoyt Koch, MD   [facility] Providers Cardiologist:  Candee Furbish, MD     Referring MD: Hoyt Koch, *    History of Present Illness:    [name] is a 72 y.o. male here for the follow-up of paroxysmal atrial fibrillation, chronic systolic heart failure now with resolution of EF.  Atrial flutter ablation 2012 DC cardioversion 2017 Heart catheterization 2012-CTO of distal RCA with collaterals-medical management Echo 2021 EF 60 to 65% after initial echo 25%  Overall been doing quite well.  Minimal dizziness with standing.  Blood pressure has been on the low side.  Still smokes.  No bleeding issues with Xarelto.  Past Medical History:  Diagnosis Date   Adenomatous colon polyp    Alcohol use    Antiphospholipid antibody positive    ???In the past???but not proven according to patient   Antiphospholipid antibody positive    ??? in the past, but not proven according to the patient.   Atrial flutter ([place])    atrial flutter ablation,   Dr.Allred October 18, 2010   BPH (benign prostatic hypertrophy)    CAD (coronary artery disease)    Catheterization 2005 disease / catheterization July 06, 2010, chronic total occlusion distal RCA with collaterals from right and left side.  Medical therapy recommended, mild decreased LV function        Carotid artery disease (HCC)    [id number] R. ICA, [id number] LICA... Doppler.... January, 2010  /  Doppler... in January, 2011 no change  /  doppler1/27/2012...stable  [id number] RICA [id number] LICA   CHF (congestive heart failure) (HCC)    COPD (chronic obstructive pulmonary disease) (HCC)    CRAO (central retinal artery occlusion)    Ejection fraction     EF 50-55%, echo, March, 2012... inferobasal and distal septal hypokinesis /      60%, echo, November, 2008   GERD (gastroesophageal reflux disease)    Hypercholesterolemia     Hypertension    Myocardial infarction ([place])    Nonsustained ventricular tachycardia (HCC)    Palpitations    probable SVT, rate 170,, at home,, lasted one hour,, 2012   Prostate cancer ([place])    Raynaud's syndrome    Stroke ([place])    mild stroke per pt    TIA (transient ischemic attack)    Possible small vessel tia in the past.   Tobacco abuse     Past Surgical History:  Procedure Laterality Date   CARDIAC CATHETERIZATION  06/27/2003   2005.... mild irregularity of the LAD..( patient had had abnormal Myoview scan)   CARDIOVERSION N/A 03/06/2016   Procedure: CARDIOVERSION;  Surgeon: Josue Hector, MD;  Location: [place];  Service: Cardiovascular;  Laterality: N/A;   INGUINAL HERNIA REPAIR  2009   right with mesh.  Dr Marlou Starks   TEE WITHOUT CARDIOVERSION N/A 03/06/2016   Procedure: TRANSESOPHAGEAL ECHOCARDIOGRAM (TEE);  Surgeon: Josue Hector, MD;  Location: [place];  Service: Cardiovascular;  Laterality: N/A;   THYROGLOSSAL DUCT CYST  2006   Dr. Wilburn Cornelia   TRANSURETHRAL RESECTION OF PROSTATE N/A 05/15/2020   Procedure: TRANSURETHRAL RESECTION OF THE PROSTATE (TURP);  Surgeon: Irine Seal, MD;  Location: [facility];  Service: Urology;  Laterality: N/A;    Current Medications: Current Meds  Medication Sig   albuterol (VENTOLIN HFA) 108 (90 Base) MCG/ACT inhaler INHALE TWO PUFFS BY  MOUTH INTO LUNGS EVERY 4 HOURS AS NEEDED FOR WHEEZING AND/OR SHORTNESS OF BREATH   amiodarone (PACERONE) 200 MG tablet TAKE 1/2 TABLET BY MOUTH ONCE DAILY   atorvastatin (LIPITOR) 40 MG tablet Take one tablet by mouth once daily   b complex vitamins capsule Take 1 capsule by mouth daily.   lisinopril (ZESTRIL) 5 MG tablet Take 1 tablet (5 mg total) by mouth daily.   metoprolol succinate (TOPROL-XL) 25 MG 24 hr tablet Take 1 tablet (25 mg total) by mouth daily.   Multiple Vitamin (MULTIVITAMIN WITH MINERALS) TABS tablet Take 1 tablet by mouth daily.   oxyCODONE-acetaminophen (PERCOCET/ROXICET) 5-325 MG  tablet Take 1-2 tablets by mouth every 6 (six) hours as needed for severe pain.   rivaroxaban (XARELTO) 20 MG TABS tablet TAKE ONE TABLET BY MOUTH ONCE DAILY   Tiotropium Bromide-Olodaterol (STIOLTO RESPIMAT) 2.5-2.5 MCG/ACT AERS INHALE TWO PUFFS BY MOUTH INTO LUNGS ONCE DAILY Needs appointment for further refills   [DISCONTINUED] lisinopril (ZESTRIL) 10 MG tablet TAKE ONE TABLET BY MOUTH ONCE DAILY   [DISCONTINUED] metoprolol succinate (TOPROL-XL) 50 MG 24 hr tablet TAKE 1/2 TABLET BY MOUTH ONCE DAILY WITH OR immediately following A meal     Allergies:   Cialis [tadalafil], Levitra [vardenafil], and Viagra [sildenafil citrate]   Social History   Socioeconomic History   Marital status: Married    Spouse name: Not on file   Number of children: Not on file   Years of education: Not on file   Highest education level: Not on file  Occupational History   Occupation: land Teacher, English as a foreign language retired  Tobacco Use   Smoking status: Every Day    Packs/day: 0.50    Years: 50.00    Total pack years: 25.00    Types: Cigarettes    Start date: 1970   Smokeless tobacco: Never   Tobacco comments:    11/30/19 .5-1pack per day  Vaping Use   Vaping Use: Never used  Substance and Sexual Activity   Alcohol use: Yes    Alcohol/week: 14.0 - 21.0 standard drinks of alcohol    Types: 14 - 21 Cans of beer per week   Drug use: No   Sexual activity: Not Currently  Other Topics Concern   Not on file  Social History Narrative   Not on file   Social Determinants of Health   Financial Resource Strain: Medium Risk (10/17/2019)   Overall Financial Resource Strain (CARDIA)    Difficulty of Paying Living Expenses: Somewhat hard  Food Insecurity: No Food Insecurity (08/03/2018)   Hunger Vital Sign    Worried About Running Out of Food in the Last Year: Never true    Ran Out of Food in the Last Year: Never true  Transportation Needs: No Transportation Needs (08/03/2018)   PRAPARE - Radiographer, therapeutic (Medical): No    Lack of Transportation (Non-Medical): No  Physical Activity: Sufficiently Active (08/03/2018)   Exercise Vital Sign    Days of Exercise per Week: 6 days    Minutes of Exercise per Session: 50 min  Stress: No Stress Concern Present (08/03/2018)   [place]    Feeling of Stress : Not at all  Social Connections: Unknown (08/03/2018)   Social Connection and Isolation Panel [NHANES]    Frequency of Communication with Friends and Family: More than three times a week    Frequency of Social Gatherings with Friends and Family: More than three times a  week    Attends Religious Services: Not on file    Active Member of Clubs or Organizations: Not on file    Attends Archivist Meetings: Not on file    Marital Status: Married     Family History: The patient's He was adopted. Family history is unknown by patient.  ROS:   Please see the history of present illness.    No fevers chills nausea vomiting syncope bleeding all other systems reviewed and are negative.  EKGs/Labs/Other Studies Reviewed:    The following studies were reviewed today: As above  EKG:  Prior EKG shows sinus rhythm with IVCD heart rate of 65 Recent Labs: 11/11/2021: ALT 16  Recent Lipid Panel    Component Value Date/Time   CHOL 143 11/11/2021 1010   TRIG 83 11/11/2021 1010   HDL 73 11/11/2021 1010   CHOLHDL 2.0 11/11/2021 1010   CHOLHDL 2 07/27/2017 0906   VLDL 12.2 07/27/2017 0906   LDLCALC 54 11/11/2021 1010     Risk Assessment/Calculations:              Physical Exam:    VS:  BP (!) 92/46 (BP Location: Left Arm, Patient Position: Sitting, Cuff Size: Normal)   Pulse 62   Ht '6\' 2"'$  (1.88 m)   Wt 165 lb (74.8 kg)   SpO2 97%   BMI 21.18 kg/m     Wt Readings from Last 3 Encounters:  05/13/22 165 lb (74.8 kg)  04/02/22 160 lb (72.6 kg)  03/04/22 160 lb (72.6 kg)     GEN: Well nourished, well  developed, in no acute distress HEENT: normal Neck: no JVD, carotid bruits, or masses Cardiac: RRR; no murmurs, rubs, or gallops,no edema  Respiratory:  clear to auscultation bilaterally, normal work of breathing GI: soft, nontender, nondistended, + BS MS: no deformity or atrophy Skin: warm and dry, no rash Neuro:  Alert and Oriented x 3, Strength and sensation are intact Psych: euthymic mood, full affect   ASSESSMENT:    1. Coronary artery disease involving native heart without angina pectoris, unspecified vessel or lesion type   2. Medication management   3. Essential hypertension   4. Hypercoagulable state due to atrial fibrillation, unspecified type ([place])   5. Paroxysmal atrial fibrillation ([place])   6. Coronary artery disease involving native coronary artery of native heart without angina pectoris   7. Chronic systolic heart failure (HCC)     PLAN:    In order of problems listed above:   CAD (coronary artery disease) Known CTO of distal RCA with collaterals.  Continue with aggressive medical management.  Stress test 2015 showed no ischemia.  No anginal symptoms.  On beta-blocker, statin.  He is not on aspirin secondary to Xarelto use.  Continues to be stable.  Carotid artery disease ([place]) Repeat Doppler in 2022 showed mild plaque.  No bruits noted.  Continue with statin, blood pressure control.  No changes made.  Asymptomatic  Chronic systolic heart failure ([place]) Improvement from original EF of 25% to 60 to 65% in 2021.  It may actually be more in the 45% range upon further review.    Continuing with goal-directed medical therapy.  Currently on ACE inhibitor.  Would not tolerate Entresto secondary to hypotension.  Would be challenging to add spironolactone as well because of hypotension.  In fact, reducing lisinopril to 5 mg, Toprol-XL continue at 25 mg  Centrilobular emphysema (HCC) Continue to encourage tobacco cessation.  Stable.  Hyperlipidemia Prior LDL 54.  Excellent.  Continue with high intensity statin atorvastatin 40 mg once a day.  No myalgias  Aortic atherosclerosis (HCC) Seen on CT scan personally reviewed.  Continue with statin therapy.  No changes  Hypercoagulable state due to atrial fibrillation (HCC) On Xarelto.  No excessive bleeding.  Continue to monitor this high risk medication with CBC as well as creatinine.  Echo and lab work today.  Orthostatic hypotension Has developed this in the past.  His Toprol was reduced to 25 mg at prior office visit with Kathyrn Drown, NP reviewed.  Reducing lisinopril to 5 mg.  He is having some symptoms when he stands up.  Paroxysmal atrial fibrillation (HCC) Currently on amiodarone 100 mg a day.  High risk medication.  Continue with monitoring of liver lungs thyroid.  Maintaining sinus rhythm well.  Trying to avoid atrial fibrillation given his prior significantly reduced ejection fraction.  Checking checking TSH and free T4.  Complete metabolic profile         Medication Adjustments/Labs and Tests Ordered: Current medicines are reviewed at length with the patient today.  Concerns regarding medicines are outlined above.  Orders Placed This Encounter  Procedures   CBC   Comprehensive metabolic panel   T4, free   TSH   Meds ordered this encounter  Medications   lisinopril (ZESTRIL) 5 MG tablet    Sig: Take 1 tablet (5 mg total) by mouth daily.    Dispense:  90 tablet    Refill:  3   metoprolol succinate (TOPROL-XL) 25 MG 24 hr tablet    Sig: Take 1 tablet (25 mg total) by mouth daily.    Dispense:  90 tablet    Refill:  3    Patient Instructions  Medication Instructions:  Please decrease your Lisinopril to 5 mg a day. Decrease Metoprolol to 25 mg a day. Continue all other medications as listed.  *If you need a refill on your cardiac medications before your next appointment, please call your pharmacy*   Lab Work: Please have blood work today (CBC, CMP, TSH, Free T4)  If you  have labs (blood work) drawn today and your tests are completely normal, you will receive your results only by: [place] (if you have [place]) OR A paper copy in the mail If you have any lab test that is abnormal or we need to change your treatment, we will call you to review the results.   Follow-Up: At [facility], you and your health needs are our priority.  As part of our continuing mission to provide you with exceptional heart care, we have created designated Provider Care Teams.  These Care Teams include your primary Cardiologist (physician) and Advanced Practice Providers (APPs -  Physician Assistants and Nurse Practitioners) who all work together to provide you with the care you need, when you need it.  We recommend signing up for the patient portal called "MyChart".  Sign up information is provided on this After Visit Summary.  MyChart is used to connect with patients for Virtual Visits (Telemedicine).  Patients are able to view lab/test results, encounter notes, upcoming appointments, etc.  Non-urgent messages can be sent to your provider as well.   To learn more about what you can do with MyChart, go to [email or web address].    Your next appointment:   6 month(s)  Provider:   Nicholes Rough, PA-C, Ambrose Pancoast, NP, Ermalinda Barrios, PA-C, Christen Bame, NP, or Richardson Dopp, PA-C     Then, Candee Furbish, MD will  plan to see you again in 1 year(s).       Signed, Candee Furbish, MD  05/13/2022 9:34 AM    [facility]

## 2022-05-13 NOTE — Patient Instructions (Signed)
Medication Instructions:  Please decrease your Lisinopril to 5 mg a day. Decrease Metoprolol to 25 mg a day. Continue all other medications as listed.  *If you need a refill on your cardiac medications before your next appointment, please call your pharmacy*   Lab Work: Please have blood work today (CBC, CMP, TSH, Free T4)  If you have labs (blood work) drawn today and your tests are completely normal, you will receive your results only by: [place] (if you have [place]) OR A paper copy in the mail If you have any lab test that is abnormal or we need to change your treatment, we will call you to review the results.   Follow-Up: At [facility], you and your health needs are our priority.  As part of our continuing mission to provide you with exceptional heart care, we have created designated Provider Care Teams.  These Care Teams include your primary Cardiologist (physician) and Advanced Practice Providers (APPs -  Physician Assistants and Nurse Practitioners) who all work together to provide you with the care you need, when you need it.  We recommend signing up for the patient portal called "MyChart".  Sign up information is provided on this After Visit Summary.  MyChart is used to connect with patients for Virtual Visits (Telemedicine).  Patients are able to view lab/test results, encounter notes, upcoming appointments, etc.  Non-urgent messages can be sent to your provider as well.   To learn more about what you can do with MyChart, go to [email or web address].    Your next appointment:   6 month(s)  Provider:   Nicholes Rough, PA-C, Ambrose Pancoast, NP, Ermalinda Barrios, PA-C, Christen Bame, NP, or Richardson Dopp, PA-C     Then, Candee Furbish, MD will plan to see you again in 1 year(s).

## 2022-05-14 LAB — CBC
Hematocrit: 44.9 % (ref 37.5–51.0)
Hemoglobin: 15 g/dL (ref 13.0–17.7)
MCH: 31.6 pg (ref 26.6–33.0)
MCHC: 33.4 g/dL (ref 31.5–35.7)
MCV: 95 fL (ref 79–97)
Platelets: 313 10*3/uL (ref 150–450)
RBC: 4.74 x10E6/uL (ref 4.14–5.80)
RDW: 12.1 % (ref 11.6–15.4)
WBC: 10.2 10*3/uL (ref 3.4–10.8)

## 2022-05-14 LAB — T4, FREE: Free T4: 1.82 ng/dL — ABNORMAL HIGH (ref 0.82–1.77)

## 2022-05-14 LAB — COMPREHENSIVE METABOLIC PANEL
ALT: 26 IU/L (ref 0–44)
AST: 18 IU/L (ref 0–40)
Albumin/Globulin Ratio: 2.3 — ABNORMAL HIGH (ref 1.2–2.2)
Albumin: 4.4 g/dL (ref 3.9–4.9)
Alkaline Phosphatase: 88 IU/L (ref 44–121)
BUN/Creatinine Ratio: 16 (ref 10–24)
BUN: 19 mg/dL (ref 8–27)
Bilirubin Total: 0.4 mg/dL (ref 0.0–1.2)
CO2: 22 mmol/L (ref 20–29)
Calcium: 9.4 mg/dL (ref 8.6–10.2)
Chloride: 101 mmol/L (ref 96–106)
Creatinine, Ser: 1.2 mg/dL (ref 0.76–1.27)
Globulin, Total: 1.9 g/dL (ref 1.5–4.5)
Glucose: 113 mg/dL — ABNORMAL HIGH (ref 70–99)
Potassium: 4.4 mmol/L (ref 3.5–5.2)
Sodium: 138 mmol/L (ref 134–144)
Total Protein: 6.3 g/dL (ref 6.0–8.5)
eGFR: 65 mL/min/{1.73_m2} (ref >59)

## 2022-05-14 LAB — TSH: TSH: 3.25 u[IU]/mL (ref 0.450–4.500)

## 2022-05-19 ENCOUNTER — Other Ambulatory Visit: Payer: Self-pay | Admitting: *Deleted

## 2022-05-19 ENCOUNTER — Encounter: Payer: Self-pay | Admitting: *Deleted

## 2022-05-19 DIAGNOSIS — Z79899 Other long term (current) drug therapy: Secondary | ICD-10-CM

## 2022-05-19 DIAGNOSIS — I480 Paroxysmal atrial fibrillation: Secondary | ICD-10-CM

## 2022-05-19 DIAGNOSIS — I2510 Atherosclerotic heart disease of native coronary artery without angina pectoris: Secondary | ICD-10-CM

## 2022-06-13 ENCOUNTER — Inpatient Hospital Stay: Admission: RE | Admit: 2022-06-13 | Payer: Medicare HMO | Source: Ambulatory Visit

## 2022-07-09 ENCOUNTER — Other Ambulatory Visit: Payer: Self-pay | Admitting: Cardiology

## 2022-07-09 DIAGNOSIS — I4891 Unspecified atrial fibrillation: Secondary | ICD-10-CM

## 2022-07-09 NOTE — Telephone Encounter (Signed)
Pt last saw Dr Anne Fu 05/13/22, last labs 05/13/22 Creat 1.20, age 72, weight 74.8kg, CrCl 60.6, based on CrCl pt is on appropriate dosage of Xarelto  QD for afib.  Will refill rx.

## 2022-07-16 ENCOUNTER — Other Ambulatory Visit: Payer: Self-pay | Admitting: Internal Medicine

## 2022-07-16 ENCOUNTER — Ambulatory Visit
Admission: RE | Admit: 2022-07-16 | Discharge: 2022-07-16 | Disposition: A | Discharge status: Home / Self Care | Payer: Medicare HMO | Source: Ambulatory Visit | Attending: Acute Care | Admitting: Acute Care

## 2022-07-16 DIAGNOSIS — I700 Atherosclerosis of aorta: Secondary | ICD-10-CM | POA: Diagnosis not present

## 2022-07-16 DIAGNOSIS — R911 Solitary pulmonary nodule: Secondary | ICD-10-CM | POA: Diagnosis not present

## 2022-07-16 DIAGNOSIS — J410 Simple chronic bronchitis: Secondary | ICD-10-CM

## 2022-07-16 DIAGNOSIS — J439 Emphysema, unspecified: Secondary | ICD-10-CM | POA: Diagnosis not present

## 2022-07-16 DIAGNOSIS — F17210 Nicotine dependence, cigarettes, uncomplicated: Secondary | ICD-10-CM

## 2022-07-16 DIAGNOSIS — Z87891 Personal history of nicotine dependence: Secondary | ICD-10-CM | POA: Diagnosis not present

## 2022-07-16 DIAGNOSIS — Z122 Encounter for screening for malignant neoplasm of respiratory organs: Secondary | ICD-10-CM | POA: Diagnosis not present

## 2022-07-21 ENCOUNTER — Other Ambulatory Visit: Payer: Self-pay | Admitting: Acute Care

## 2022-07-21 DIAGNOSIS — Z87891 Personal history of nicotine dependence: Secondary | ICD-10-CM

## 2022-07-21 DIAGNOSIS — Z122 Encounter for screening for malignant neoplasm of respiratory organs: Secondary | ICD-10-CM

## 2022-09-08 DIAGNOSIS — C61 Malignant neoplasm of prostate: Secondary | ICD-10-CM | POA: Diagnosis not present

## 2022-09-15 DIAGNOSIS — R3912 Poor urinary stream: Secondary | ICD-10-CM | POA: Diagnosis not present

## 2022-09-15 DIAGNOSIS — C61 Malignant neoplasm of prostate: Secondary | ICD-10-CM | POA: Diagnosis not present

## 2022-09-15 DIAGNOSIS — N401 Enlarged prostate with lower urinary tract symptoms: Secondary | ICD-10-CM | POA: Diagnosis not present

## 2022-09-19 ENCOUNTER — Other Ambulatory Visit: Payer: Self-pay | Admitting: Urology

## 2022-09-19 DIAGNOSIS — C61 Malignant neoplasm of prostate: Secondary | ICD-10-CM

## 2022-09-19 DIAGNOSIS — R972 Elevated prostate specific antigen [PSA]: Secondary | ICD-10-CM

## 2022-10-30 ENCOUNTER — Encounter (INDEPENDENT_AMBULATORY_CARE_PROVIDER_SITE_OTHER): Payer: Self-pay

## 2022-11-04 ENCOUNTER — Other Ambulatory Visit: Payer: Self-pay | Admitting: Internal Medicine

## 2022-11-07 ENCOUNTER — Encounter: Payer: Self-pay | Admitting: Urology

## 2022-11-12 ENCOUNTER — Ambulatory Visit
Admission: RE | Admit: 2022-11-12 | Discharge: 2022-11-12 | Disposition: A | Discharge status: Home / Self Care | Payer: Medicare HMO | Source: Ambulatory Visit | Attending: Urology | Admitting: Urology

## 2022-11-12 DIAGNOSIS — C61 Malignant neoplasm of prostate: Secondary | ICD-10-CM

## 2022-11-12 DIAGNOSIS — R972 Elevated prostate specific antigen [PSA]: Secondary | ICD-10-CM

## 2022-11-12 NOTE — Progress Notes (Unsigned)
Office Visit    Patient Name: [name] Date of Encounter: 11/13/2022  PCP:  Myrlene Broker, MD   [facility]  Cardiologist:  Donato Schultz, MD  Advanced Practice Provider:  No care team member to display Electrophysiologist:  None   HPI    [name] is a 72 y.o. male with past medical history significant for paroxysmal atrial fibrillation, chronic systolic heart failure with resolution of EF, alcohol use, CAD (catheterization 2012 showed chronic total occlusion of the distal RCA with collaterals from right to left, medical therapy recommended), carotid artery disease, COPD, hypertension, hyperlipidemia, palpitations, stroke, tobacco abuse presents today for 12-month follow-up appointment.  He was seen by Dr. Anne Fu 04/2021 and overall has been doing well.  He does have history of atrial fibrillation with ablation in 2012 and DC cardioversion in 2017.  His history also includes chronic systolic heart failure with EF back in 2021 of 66 5% after initial echo showing EF 25%.  He was having some minimal dizziness with standing at his last appointment.  Blood pressure had been low.  No bleeding issues with Xarelto.  Still smoking.  I saw him last 11/11/2021, he feels pretty good.  He is still smoking.  He still is having some dizziness when he stands up too quickly but it subsides quickly.  He does have a blood pressure cuff and we have asked him to take his blood pressure daily for 2 weeks.  He tells me that his shortness of breath has gotten a little bit worse but he does have issues with his lungs as well.  He has a history of carotid artery disease and does not have an updated ultrasound.  We discussed this as well.  Dr. Anne Fu saw him February 2024 and at that time was doing well.  Minimal dizziness with standing.  Blood pressure had been on the low side.  Still smoking.  No issues with Xarelto.  Today, he tells me that he quit smoking at the first of the  year.  He had smoked for 50 years.  He then unfortunately got COVID and had some more shortness of breath.  Now subsiding.  He does also have some COPD going on.  We reviewed his visit from February.  Blood pressure looked better today.  Less lightheadedness when changing positions.  He is getting lab work done next week here and we have added on A1c and a lipid panel.  Reports no shortness of breath nor dyspnea on exertion. Reports no chest pain, pressure, or tightness. No edema, orthopnea, PND. Reports no palpitations.    Past Medical History    Past Medical History:  Diagnosis Date   Adenomatous colon polyp    Alcohol use    Antiphospholipid antibody positive    ???In the past???but not proven according to patient   Antiphospholipid antibody positive    ??? in the past, but not proven according to the patient.   Atrial flutter (HCC)    atrial flutter ablation,   Dr.Allred October 18, 2010   BPH (benign prostatic hypertrophy)    CAD (coronary artery disease)    Catheterization 2005 disease / catheterization July 06, 2010, chronic total occlusion distal RCA with collaterals from right and left side.  Medical therapy recommended, mild decreased LV function        Carotid artery disease (HCC)    0-39% R. ICA, 40-59% LICA... Doppler.... January, 2010  /  Doppler... in January, 2011 no  change  /  doppler1/27/2012...stable  0-39% RICA 40-59% LICA   CHF (congestive heart failure) (HCC)    COPD (chronic obstructive pulmonary disease) (HCC)    CRAO (central retinal artery occlusion)    Ejection fraction     EF 50-55%, echo, March, 2012... inferobasal and distal septal hypokinesis /      60%, echo, November, 2008   GERD (gastroesophageal reflux disease)    Hypercholesterolemia    Hypertension    Myocardial infarction (HCC)    Nonsustained ventricular tachycardia (HCC)    Palpitations    probable SVT, rate 170,, at home,, lasted one hour,, 2012   Prostate cancer (HCC)    Raynaud's syndrome     Stroke (HCC)    mild stroke per pt    TIA (transient ischemic attack)    Possible small vessel tia in the past.   Tobacco abuse    Past Surgical History:  Procedure Laterality Date   CARDIAC CATHETERIZATION  06/27/2003   2005.... mild irregularity of the LAD..( patient had had abnormal Myoview scan)   CARDIOVERSION N/A 03/06/2016   Procedure: CARDIOVERSION;  Surgeon: Wendall Stade, MD;  Location: [facility] ENDOSCOPY;  Service: Cardiovascular;  Laterality: N/A;   INGUINAL HERNIA REPAIR  2009   right with mesh.  Dr Carolynne Edouard   TEE WITHOUT CARDIOVERSION N/A 03/06/2016   Procedure: TRANSESOPHAGEAL ECHOCARDIOGRAM (TEE);  Surgeon: Wendall Stade, MD;  Location: [facility] ENDOSCOPY;  Service: Cardiovascular;  Laterality: N/A;   THYROGLOSSAL DUCT CYST  2006   Dr. Annalee Genta   TRANSURETHRAL RESECTION OF PROSTATE N/A 05/15/2020   Procedure: TRANSURETHRAL RESECTION OF THE PROSTATE (TURP);  Surgeon: Bjorn Pippin, MD;  Location: [facility] ORS;  Service: Urology;  Laterality: N/A;    Allergies  Allergies  Allergen Reactions   Cialis [Tadalafil] Other (See Comments)    History of stroke    Levitra [Vardenafil] Other (See Comments)    History of stroke    Viagra [Sildenafil Citrate] Other (See Comments)    History of stroke      EKGs/Labs/Other Studies Reviewed:   The following studies were reviewed today:  Echocardiogram 10/19/2019  IMPRESSIONS     1. Left ventricular ejection fraction, by estimation, is 60 to 65%. The  left ventricle has normal function. The left ventricle has no regional  wall motion abnormalities. Left ventricular diastolic parameters were  normal.   2. Right ventricular systolic function is normal. The right ventricular  size is normal.   3. The mitral valve is grossly normal. Trivial mitral valve  regurgitation.   4. The aortic valve is normal in structure. Aortic valve regurgitation is  not visualized. No aortic stenosis is present.   Carotid ultrasound 04/05/2020 Summary:  Right  Carotid: Velocities in the right ICA are consistent with a 1-39%  stenosis.                Non-hemodynamically significant plaque <50% noted in the  CCA. The                 ECA appears >50% stenosed.   Left Carotid: Velocities in the left ICA are consistent with a 1-39%  stenosis.   Vertebrals: Bilateral vertebral arteries demonstrate antegrade flow.  Subclavians: Normal flow hemodynamics were seen in bilateral subclavian               arteries.   EKG:  EKG is  ordered today.  The ekg ordered today demonstrates normal sinus rhythm, rate 67 bpm  Recent Labs: 05/13/2022: ALT 26;  BUN 19; Creatinine, Ser 1.20; Hemoglobin 15.0; Platelets 313; Potassium 4.4; Sodium 138; TSH 3.250  Recent Lipid Panel    Component Value Date/Time   CHOL 143 11/11/2021 1010   TRIG 83 11/11/2021 1010   HDL 73 11/11/2021 1010   CHOLHDL 2.0 11/11/2021 1010   CHOLHDL 2 07/27/2017 0906   VLDL 12.2 07/27/2017 0906   LDLCALC 54 11/11/2021 1010     Home Medications   Current Meds  Medication Sig   albuterol (VENTOLIN HFA) 108 (90 Base) MCG/ACT inhaler INHALE TWO PUFFS BY MOUTH INTO LUNGS every FOUR hours AS NEEDED FOR WHEEZING AND/OR SHORTNESS OF BREATH   amiodarone (PACERONE) 200 MG tablet TAKE 1/2 TABLET BY MOUTH ONCE DAILY   atorvastatin (LIPITOR) 40 MG tablet Take one tablet by mouth once daily   b complex vitamins capsule Take 1 capsule by mouth daily.   lisinopril (ZESTRIL) 5 MG tablet Take 1 tablet (5 mg total) by mouth daily.   metoprolol succinate (TOPROL-XL) 25 MG 24 hr tablet Take 1 tablet (25 mg total) by mouth daily.   Multiple Vitamin (MULTIVITAMIN WITH MINERALS) TABS tablet Take 1 tablet by mouth daily.   oxyCODONE-acetaminophen (PERCOCET/ROXICET) 5-325 MG tablet Take 1-2 tablets by mouth every 6 (six) hours as needed for severe pain.   rivaroxaban (XARELTO) 20 MG TABS tablet TAKE ONE TABLET BY MOUTH ONCE DAILY   Tiotropium Bromide-Olodaterol (STIOLTO RESPIMAT) 2.5-2.5 MCG/ACT AERS INHALE TWO  PUFFS BY MOUTH INTO LUNGS ONCE daily     Review of Systems      All other systems reviewed and are otherwise negative except as noted above.  Physical Exam    VS:  BP 110/68   Pulse (!) 59   Ht 6\' 2"  (1.88 m)   Wt 169 lb 12.8 oz (77 kg)   SpO2 96%   BMI 21.80 kg/m  , BMI Body mass index is 21.8 kg/m.  Wt Readings from Last 3 Encounters:  11/13/22 169 lb 12.8 oz (77 kg)  05/13/22 165 lb (74.8 kg)  04/02/22 160 lb (72.6 kg)     GEN: Well nourished, well developed, in no acute distress. HEENT: normal. Neck: Supple, no JVD, carotid bruits, or masses. Cardiac: RRR, no murmurs, rubs, or gallops. No clubbing, cyanosis, edema.  Radials/PT 2+ and equal bilaterally.  Respiratory:  Respirations regular and unlabored, clear to auscultation bilaterally. GI: Soft, nontender, nondistended. MS: No deformity or atrophy. Skin: Warm and dry, no rash. Neuro:  Strength and sensation are intact. Psych: Normal affect.  Assessment & Plan    CAD -no recent chest pain -Continue GDMT: Lipitor 40 mg daily, lisinopril 10 mg daily, metoprolol succinate 25 mg daily, Xarelto 20 mg daily, amiodarone 100 mg daily  Bilateral carotid artery stenosis -we ordered an updated carotid US -no bruit on exam  Chronic systolic heart failure -euvolemic today -some SOB which he does feel like is getting worse -Echocardiogram from last reviewed, SOB likely due to chronic COPD  Centrilobular emphysema -Chronic SOB -he is due for a scan in the fall  Aortic atherosclerosis -continue Lipitor 40mg  daily -we will order LFTs and Lipid panel ordered for next week  Hypercoagulable state due to A-fib -no issues with bleeding  Orthostatic hypotension  -still has some issues with lightheadedness with transitions  but much better -hydration and encourage slow transitions  -BP stable today  Paroxysmal atrial fibrillation -no issues with palpitations -he is in NSR today -continue Xarelto for  anticoagulation         Disposition:  Follow up 6 months with Donato Schultz, MD or APP.  Signed, Sharlene Dory, PA-C 11/13/2022, 12:35 PM  [facility]

## 2022-11-13 ENCOUNTER — Encounter: Payer: Self-pay | Admitting: Physician Assistant

## 2022-11-13 ENCOUNTER — Ambulatory Visit: Payer: Medicare HMO | Attending: Physician Assistant | Admitting: Physician Assistant

## 2022-11-13 VITALS — BP 110/68 | HR 59 | Ht 74.0 in | Wt 169.8 lb

## 2022-11-13 DIAGNOSIS — I951 Orthostatic hypotension: Secondary | ICD-10-CM | POA: Diagnosis not present

## 2022-11-13 DIAGNOSIS — I10 Essential (primary) hypertension: Secondary | ICD-10-CM | POA: Diagnosis not present

## 2022-11-13 DIAGNOSIS — I700 Atherosclerosis of aorta: Secondary | ICD-10-CM

## 2022-11-13 DIAGNOSIS — I4891 Unspecified atrial fibrillation: Secondary | ICD-10-CM

## 2022-11-13 DIAGNOSIS — I5022 Chronic systolic (congestive) heart failure: Secondary | ICD-10-CM | POA: Diagnosis not present

## 2022-11-13 DIAGNOSIS — I2510 Atherosclerotic heart disease of native coronary artery without angina pectoris: Secondary | ICD-10-CM

## 2022-11-13 DIAGNOSIS — I480 Paroxysmal atrial fibrillation: Secondary | ICD-10-CM | POA: Diagnosis not present

## 2022-11-13 DIAGNOSIS — J432 Centrilobular emphysema: Secondary | ICD-10-CM

## 2022-11-13 DIAGNOSIS — D6869 Other thrombophilia: Secondary | ICD-10-CM

## 2022-11-13 DIAGNOSIS — I6523 Occlusion and stenosis of bilateral carotid arteries: Secondary | ICD-10-CM | POA: Diagnosis not present

## 2022-11-13 NOTE — Patient Instructions (Signed)
Medication Instructions:  Your physician recommends that you continue on your current medications as directed. Please refer to the Current Medication list given to you today.  *If you need a refill on your cardiac medications before your next appointment, please call your pharmacy*  Lab Work: Labs on 11/19/2022 as previously scheduled If you have labs (blood work) drawn today and your tests are completely normal, you will receive your results only by: MyChart Message (if you have MyChart) OR A paper copy in the mail If you have any lab test that is abnormal or we need to change your treatment, we will call you to review the results.  Testing/Procedures: Your physician has requested that you have a carotid duplex. This test is an ultrasound of the carotid arteries in your neck. It looks at blood flow through these arteries that supply the brain with blood. Allow one hour for this exam. There are no restrictions or special instructions.  Follow-Up: At [facility], you and your health needs are our priority.  As part of our continuing mission to provide you with exceptional heart care, we have created designated Provider Care Teams.  These Care Teams include your primary Cardiologist (physician) and Advanced Practice Providers (APPs -  Physician Assistants and Nurse Practitioners) who all work together to provide you with the care you need, when you need it.  Your next appointment:   6 month(s)  Provider:   Donato Schultz, MD     Heart-Healthy Eating Plan Many factors influence your heart health, including eating and exercise habits. Heart health is also called coronary health. Coronary risk increases with abnormal blood fat (lipid) levels. A heart-healthy eating plan includes limiting unhealthy fats, increasing healthy fats, limiting salt (sodium) intake, and making other diet and lifestyle changes. What is my plan? Your health care provider may recommend that: You limit your fat intake  to _________% or less of your total calories each day. You limit your saturated fat intake to _________% or less of your total calories each day. You limit the amount of cholesterol in your diet to less than _________ mg per day. You limit the amount of sodium in your diet to less than _________ mg per day. What are tips for following this plan? Cooking Cook foods using methods other than frying. Baking, boiling, grilling, and broiling are all good options. Other ways to reduce fat include: Removing the skin from poultry. Removing all visible fats from meats. Steaming vegetables in water or broth. Meal planning  At meals, imagine dividing your plate into fourths: Fill one-half of your plate with vegetables and green salads. Fill one-fourth of your plate with whole grains. Fill one-fourth of your plate with lean protein foods. Eat 2-4 cups of vegetables per day. One cup of vegetables equals 1 cup (91 g) broccoli or cauliflower florets, 2 medium carrots, 1 large bell pepper, 1 large sweet potato, 1 large tomato, 1 medium white potato, 2 cups (150 g) raw leafy greens. Eat 1-2 cups of fruit per day. One cup of fruit equals 1 small apple, 1 large banana, 1 cup (237 g) mixed fruit, 1 large orange,  cup (82 g) dried fruit, 1 cup (240 mL) 100% fruit juice. Eat more foods that contain soluble fiber. Examples include apples, broccoli, carrots, beans, peas, and barley. Aim to get 25-30 g of fiber per day. Increase your consumption of legumes, nuts, and seeds to 4-5 servings per week. One serving of dried beans or legumes equals  cup (90 g)  cooked, 1 serving of nuts is  oz (12 almonds, 24 pistachios, or 7 walnut halves), and 1 serving of seeds equals  oz (8 g). Fats Choose healthy fats more often. Choose monounsaturated and polyunsaturated fats, such as olive and canola oils, avocado oil, flaxseeds, walnuts, almonds, and seeds. Eat more omega-3 fats. Choose salmon, mackerel, sardines, tuna, flaxseed  oil, and ground flaxseeds. Aim to eat fish at least 2 times each week. Check food labels carefully to identify foods with trans fats or high amounts of saturated fat. Limit saturated fats. These are found in animal products, such as meats, butter, and cream. Plant sources of saturated fats include palm oil, palm kernel oil, and coconut oil. Avoid foods with partially hydrogenated oils in them. These contain trans fats. Examples are stick margarine, some tub margarines, cookies, crackers, and other baked goods. Avoid fried foods. General information Eat more home-cooked food and less restaurant, buffet, and fast food. Limit or avoid alcohol. Limit foods that are high in added sugar and simple starches such as foods made using white refined flour (white breads, pastries, sweets). Lose weight if you are overweight. Losing just 5-10% of your body weight can help your overall health and prevent diseases such as diabetes and heart disease. Monitor your sodium intake, especially if you have high blood pressure. Talk with your health care provider about your sodium intake. Try to incorporate more vegetarian meals weekly. What foods should I eat? Fruits All fresh, canned (in natural juice), or frozen fruits. Vegetables Fresh or frozen vegetables (raw, steamed, roasted, or grilled). Green salads. Grains Most grains. Choose whole wheat and whole grains most of the time. Rice and pasta, including brown rice and pastas made with whole wheat. Meats and other proteins Lean, well-trimmed beef, veal, pork, and lamb. Chicken and [place] without skin. All fish and shellfish. Wild duck, rabbit, pheasant, and venison. Egg whites or low-cholesterol egg substitutes. Dried beans, peas, lentils, and tofu. Seeds and most nuts. Dairy Low-fat or nonfat cheeses, including ricotta and mozzarella. Skim or 1% milk (liquid, powdered, or evaporated). Buttermilk made with low-fat milk. Nonfat or low-fat yogurt. Fats and  oils Non-hydrogenated (trans-free) margarines. Vegetable oils, including soybean, sesame, sunflower, olive, avocado, peanut, safflower, corn, canola, and cottonseed. Salad dressings or mayonnaise made with a vegetable oil. Beverages Water (mineral or sparkling). Coffee and tea. Unsweetened ice tea. Diet beverages. Sweets and desserts Sherbet, gelatin, and fruit ice. Small amounts of dark chocolate. Limit all sweets and desserts. Seasonings and condiments All seasonings and condiments. The items listed above may not be a complete list of foods and beverages you can eat. Contact a dietitian for more options. What foods should I avoid? Fruits Canned fruit in heavy syrup. Fruit in cream or butter sauce. Fried fruit. Limit coconut. Vegetables Vegetables cooked in cheese, cream, or butter sauce. Fried vegetables. Grains Breads made with saturated or trans fats, oils, or whole milk. Croissants. Sweet rolls. Donuts. High-fat crackers, such as cheese crackers and chips. Meats and other proteins Fatty meats, such as hot dogs, ribs, sausage, bacon, rib-eye roast or steak. High-fat deli meats, such as salami and bologna. Caviar. Domestic duck and goose. Organ meats, such as liver. Dairy Cream, sour cream, cream cheese, and creamed cottage cheese. Whole-milk cheeses. Whole or 2% milk (liquid, evaporated, or condensed). Whole buttermilk. Cream sauce or high-fat cheese sauce. Whole-milk yogurt. Fats and oils Meat fat, or shortening. Cocoa butter, hydrogenated oils, palm oil, coconut oil, palm kernel oil. Solid fats and shortenings, including bacon fat,  salt pork, lard, and butter. Nondairy cream substitutes. Salad dressings with cheese or sour cream. Beverages Regular sodas and any drinks with added sugar. Sweets and desserts Frosting. Pudding. Cookies. Cakes. Pies. Milk chocolate or white chocolate. Buttered syrups. Full-fat ice cream or ice cream drinks. The items listed above may not be a complete  list of foods and beverages to avoid. Contact a dietitian for more information. Summary Heart-healthy meal planning includes limiting unhealthy fats, increasing healthy fats, limiting salt (sodium) intake and making other diet and lifestyle changes. Lose weight if you are overweight. Losing just 5-10% of your body weight can help your overall health and prevent diseases such as diabetes and heart disease. Focus on eating a balance of foods, including fruits and vegetables, low-fat or nonfat dairy, lean protein, nuts and legumes, whole grains, and heart-healthy oils and fats. This information is not intended to replace advice given to you by your health care provider. Make sure you discuss any questions you have with your health care provider. Document Revised: 04/08/2021 Document Reviewed: 04/08/2021 Elsevier Patient Education  2024 [employer] Low-Sodium Eating Plan Salt (sodium) helps you keep a healthy balance of fluids in your body. Too much sodium can raise your blood pressure. It can also cause fluid and waste to be held in your body. Your health care provider or dietitian may recommend a low-sodium eating plan if you have high blood pressure (hypertension), kidney disease, liver disease, or heart failure. Eating less sodium can help lower your blood pressure and reduce swelling. It can also protect your heart, liver, and kidneys. What are tips for following this plan? Reading food labels  Check food labels for the amount of sodium per serving. If you eat more than one serving, you must multiply the listed amount by the number of servings. Choose foods with less than 140 milligrams (mg) of sodium per serving. Avoid foods with 300 mg of sodium or more per serving. Always check how much sodium is in a product, even if the label says "unsalted" or "no salt added." Shopping  Buy products labeled as "low-sodium" or "no salt added." Buy fresh foods. Avoid canned foods and pre-made or frozen  meals. Avoid canned, cured, or processed meats. Buy breads that have less than 80 mg of sodium per slice. Cooking  Eat more home-cooked food. Try to eat less restaurant, buffet, and fast food. Try not to add salt when you cook. Use salt-free seasonings or herbs instead of table salt or sea salt. Check with your provider or pharmacist before using salt substitutes. Cook with plant-based oils, such as canola, sunflower, or olive oil. Meal planning When eating at a restaurant, ask if your food can be made with less salt or no salt. Avoid dishes labeled as brined, pickled, cured, or smoked. Avoid dishes made with soy sauce, miso, or teriyaki sauce. Avoid foods that have monosodium glutamate (MSG) in them. MSG may be added to some restaurant food, sauces, soups, bouillon, and canned foods. Make meals that can be grilled, baked, poached, roasted, or steamed. These are often made with less sodium. General information Try to limit your sodium intake to 1,500-2,300 mg each day, or the amount told by your provider. What foods should I eat? Fruits Fresh, frozen, or canned fruit. Fruit juice. Vegetables Fresh or frozen vegetables. "No salt added" canned vegetables. "No salt added" tomato sauce and paste. Low-sodium or reduced-sodium tomato and vegetable juice. Grains Low-sodium cereals, such as oats, puffed wheat and rice, and shredded wheat. Low-sodium  crackers. Unsalted rice. Unsalted pasta. Low-sodium bread. Whole grain breads and whole grain pasta. Meats and other proteins Fresh or frozen meat, poultry, seafood, and fish. These should have no added salt. Low-sodium canned tuna and salmon. Unsalted nuts. Dried peas, beans, and lentils without added salt. Unsalted canned beans. Eggs. Unsalted nut butters. Dairy Milk. Soy milk. Cheese that is naturally low in sodium, such as ricotta cheese, fresh mozzarella, or Swiss cheese. Low-sodium or reduced-sodium cheese. Cream cheese. Yogurt. Seasonings and  condiments Fresh and dried herbs and spices. Salt-free seasonings. Low-sodium mustard and ketchup. Sodium-free salad dressing. Sodium-free light mayonnaise. Fresh or refrigerated horseradish. Lemon juice. Vinegar. Other foods Homemade, reduced-sodium, or low-sodium soups. Unsalted popcorn and pretzels. Low-salt or salt-free chips. The items listed above may not be all the foods and drinks you can have. Talk to a dietitian to learn more. What foods should I avoid? Vegetables Sauerkraut, pickled vegetables, and relishes. Olives. [place] fries. Onion rings. Regular canned vegetables, except low-sodium or reduced-sodium items. Regular canned tomato sauce and paste. Regular tomato and vegetable juice. Frozen vegetables in sauces. Grains Instant hot cereals. Bread stuffing, pancake, and biscuit mixes. Croutons. Seasoned rice or pasta mixes. Noodle soup cups. Boxed or frozen macaroni and cheese. Regular salted crackers. Self-rising flour. Meats and other proteins Meat or fish that is salted, canned, smoked, spiced, or pickled. Precooked or cured meat, such as sausages or meat loaves. Tomasa Blase. Ham. Pepperoni. Hot dogs. Corned beef. Chipped beef. Salt pork. Jerky. Pickled herring, anchovies, and sardines. Regular canned tuna. Salted nuts. Dairy Processed cheese and cheese spreads. Hard cheeses. Cheese curds. Blue cheese. Feta cheese. String cheese. Regular cottage cheese. Buttermilk. Canned milk. Fats and oils Salted butter. Regular margarine. Ghee. Bacon fat. Seasonings and condiments Onion salt, garlic salt, seasoned salt, table salt, and sea salt. Canned and packaged gravies. Worcestershire sauce. Tartar sauce. Barbecue sauce. Teriyaki sauce. Soy sauce, including reduced-sodium soy sauce. Steak sauce. Fish sauce. Oyster sauce. Cocktail sauce. Horseradish that you find on the shelf. Regular ketchup and mustard. Meat flavorings and tenderizers. Bouillon cubes. Hot sauce. Pre-made or packaged marinades.  Pre-made or packaged taco seasonings. Relishes. Regular salad dressings. Salsa. Other foods Salted popcorn and pretzels. Corn chips and puffs. Potato and tortilla chips. Canned or dried soups. Pizza. Frozen entrees and pot pies. The items listed above may not be all the foods and drinks you should avoid. Talk to a dietitian to learn more. This information is not intended to replace advice given to you by your health care provider. Make sure you discuss any questions you have with your health care provider. Document Revised: 03/20/2022 Document Reviewed: 03/20/2022 Elsevier Patient Education  2024 [employer].

## 2022-11-18 ENCOUNTER — Other Ambulatory Visit (HOSPITAL_COMMUNITY): Payer: Self-pay

## 2022-11-19 ENCOUNTER — Telehealth: Payer: Self-pay | Admitting: Cardiology

## 2022-11-19 ENCOUNTER — Ambulatory Visit: Payer: Medicare HMO | Attending: Cardiology

## 2022-11-19 DIAGNOSIS — I2510 Atherosclerotic heart disease of native coronary artery without angina pectoris: Secondary | ICD-10-CM | POA: Diagnosis not present

## 2022-11-19 DIAGNOSIS — I10 Essential (primary) hypertension: Secondary | ICD-10-CM

## 2022-11-19 DIAGNOSIS — I951 Orthostatic hypotension: Secondary | ICD-10-CM | POA: Diagnosis not present

## 2022-11-19 DIAGNOSIS — I700 Atherosclerosis of aorta: Secondary | ICD-10-CM | POA: Diagnosis not present

## 2022-11-19 DIAGNOSIS — I480 Paroxysmal atrial fibrillation: Secondary | ICD-10-CM

## 2022-11-19 DIAGNOSIS — D6869 Other thrombophilia: Secondary | ICD-10-CM | POA: Diagnosis not present

## 2022-11-19 DIAGNOSIS — Z79899 Other long term (current) drug therapy: Secondary | ICD-10-CM

## 2022-11-19 DIAGNOSIS — I4891 Unspecified atrial fibrillation: Secondary | ICD-10-CM | POA: Diagnosis not present

## 2022-11-19 DIAGNOSIS — I5022 Chronic systolic (congestive) heart failure: Secondary | ICD-10-CM

## 2022-11-19 DIAGNOSIS — I6523 Occlusion and stenosis of bilateral carotid arteries: Secondary | ICD-10-CM

## 2022-11-19 DIAGNOSIS — J432 Centrilobular emphysema: Secondary | ICD-10-CM

## 2022-11-19 MED ORDER — RIVAROXABAN 20 MG PO TABS
20.0000 mg | ORAL_TABLET | Freq: Every day | ORAL | 0 refills | Status: DC
Start: 2022-11-19 — End: 2022-11-20

## 2022-11-19 NOTE — Telephone Encounter (Signed)
Pt c/o medication issue:  1. Name of Medication:   rivaroxaban (XARELTO) 20 MG TABS tablet    2. How are you currently taking this medication (dosage and times per day)? As written   3. Are you having a reaction (difficulty breathing--STAT)? No   4. What is your medication issue? Pt states he switched pharmacies to mail order due to insurance and he will not get his refill until 14 days from now. He will run out before then. He wants to know what he should do. Please advise.

## 2022-11-19 NOTE — Telephone Encounter (Signed)
I spoke with patient. He is changing to mail order for Xarelto because his previous pharmacy is closing.  Mail order has told him it could be 10-14 days before medication arrives.  I told patient I could send prescription to another local pharmacy.  Patient [facility] [address]

## 2022-11-20 ENCOUNTER — Other Ambulatory Visit: Payer: Self-pay

## 2022-11-20 ENCOUNTER — Telehealth: Payer: Self-pay | Admitting: *Deleted

## 2022-11-20 DIAGNOSIS — I4891 Unspecified atrial fibrillation: Secondary | ICD-10-CM

## 2022-11-20 LAB — CBC
Hematocrit: 46.2 % (ref 37.5–51.0)
Hemoglobin: 15.9 g/dL (ref 13.0–17.7)
MCH: 32.4 pg (ref 26.6–33.0)
MCHC: 34.4 g/dL (ref 31.5–35.7)
MCV: 94 fL (ref 79–97)
Platelets: 276 10*3/uL (ref 150–450)
RBC: 4.9 x10E6/uL (ref 4.14–5.80)
RDW: 12.6 % (ref 11.6–15.4)
WBC: 8.1 10*3/uL (ref 3.4–10.8)

## 2022-11-20 LAB — COMPREHENSIVE METABOLIC PANEL
ALT: 25 IU/L (ref 0–44)
AST: 23 IU/L (ref 0–40)
Albumin: 4.4 g/dL (ref 3.8–4.8)
Alkaline Phosphatase: 82 IU/L (ref 44–121)
BUN/Creatinine Ratio: 14 (ref 10–24)
BUN: 18 mg/dL (ref 8–27)
Bilirubin Total: 0.7 mg/dL (ref 0.0–1.2)
CO2: 24 mmol/L (ref 20–29)
Calcium: 9.8 mg/dL (ref 8.6–10.2)
Chloride: 101 mmol/L (ref 96–106)
Creatinine, Ser: 1.3 mg/dL — ABNORMAL HIGH (ref 0.76–1.27)
Globulin, Total: 2.5 g/dL (ref 1.5–4.5)
Glucose: 94 mg/dL (ref 70–99)
Potassium: 4.5 mmol/L (ref 3.5–5.2)
Sodium: 136 mmol/L (ref 134–144)
Total Protein: 6.9 g/dL (ref 6.0–8.5)
eGFR: 59 mL/min/{1.73_m2} — ABNORMAL LOW (ref >59)

## 2022-11-20 LAB — LIPID PANEL
Chol/HDL Ratio: 2.3 ratio (ref 0.0–5.0)
Cholesterol, Total: 146 mg/dL (ref 100–199)
HDL: 64 mg/dL (ref >39)
LDL Chol Calc (NIH): 65 mg/dL (ref 0–99)
Triglycerides: 89 mg/dL (ref 0–149)
VLDL Cholesterol Cal: 17 mg/dL (ref 5–40)

## 2022-11-20 LAB — HEMOGLOBIN A1C
Est. average glucose Bld gHb Est-mCnc: 111 mg/dL
Hgb A1c MFr Bld: 5.5 % (ref 4.8–5.6)

## 2022-11-20 LAB — T4, FREE: Free T4: 1.71 ng/dL (ref 0.82–1.77)

## 2022-11-20 LAB — TSH: TSH: 4.27 u[IU]/mL (ref 0.450–4.500)

## 2022-11-20 MED ORDER — AMIODARONE HCL 200 MG PO TABS: ORAL_TABLET | ORAL | 3 refills | Status: DC

## 2022-11-20 MED ORDER — RIVAROXABAN 20 MG PO TABS: 20.0000 mg | ORAL_TABLET | Freq: Every day | ORAL | 1 refills | Status: DC

## 2022-11-20 MED ORDER — LISINOPRIL 5 MG PO TABS: 5.0000 mg | ORAL_TABLET | Freq: Every day | ORAL | 3 refills | Status: DC

## 2022-11-20 MED ORDER — METOPROLOL SUCCINATE ER 25 MG PO TB24: 25.0000 mg | ORAL_TABLET | Freq: Every day | ORAL | 3 refills | Status: DC

## 2022-11-20 NOTE — Telephone Encounter (Signed)
Xarelto 20mg  refill request received from [facility]. Pt is 72 years old, weight-77kg, Crea-1.30 on 11/19/22, last seen by Jari Favre on 11/13/22, Diagnosis-Afib, CrCl-56.76 mL/min; Dose is appropriate based on dosing criteria. Will send in refill to requested pharmacy.     [facility]

## 2022-11-25 ENCOUNTER — Ambulatory Visit (HOSPITAL_COMMUNITY)
Admission: RE | Admit: 2022-11-25 | Discharge: 2022-11-25 | Disposition: A | Discharge status: Home / Self Care | Payer: Medicare HMO | Source: Ambulatory Visit | Attending: Physician Assistant | Admitting: Physician Assistant

## 2022-11-25 DIAGNOSIS — I2510 Atherosclerotic heart disease of native coronary artery without angina pectoris: Secondary | ICD-10-CM | POA: Insufficient documentation

## 2022-11-27 ENCOUNTER — Other Ambulatory Visit: Payer: Self-pay

## 2022-11-27 ENCOUNTER — Telehealth: Payer: Self-pay | Admitting: Internal Medicine

## 2022-11-27 MED ORDER — STIOLTO RESPIMAT 2.5-2.5 MCG/ACT IN AERS: INHALATION_SPRAY | RESPIRATORY_TRACT | 1 refills | Status: DC

## 2022-11-27 NOTE — Telephone Encounter (Signed)
Sent in

## 2022-11-27 NOTE — Telephone Encounter (Signed)
Ok for 30 day has upcoming apt

## 2022-11-27 NOTE — Telephone Encounter (Signed)
Prescription Request  11/27/2022  LOV: 11/29/2021  What is the name of the medication or equipment?  Tiotropium Bromide-Olodaterol (STIOLTO RESPIMAT) 2.5-2.5    Have you contacted your pharmacy to request a refill? No   Which pharmacy would you like this sent to?    [id number], [place] - [address] [place] [address] Phone: [phone] Fax: [phone]  Patient notified that their request is being sent to the clinical staff for review and that they should receive a response within 2 business days.   Please advise at Mobile [phone] (mobile)

## 2022-11-27 NOTE — Telephone Encounter (Signed)
Schedule patient

## 2022-12-01 ENCOUNTER — Encounter: Payer: Self-pay | Admitting: Internal Medicine

## 2022-12-01 ENCOUNTER — Ambulatory Visit (INDEPENDENT_AMBULATORY_CARE_PROVIDER_SITE_OTHER): Payer: Medicare HMO | Admitting: Internal Medicine

## 2022-12-01 VITALS — BP 118/60 | HR 58 | Temp 98.0°F | Ht 74.0 in | Wt 172.0 lb

## 2022-12-01 DIAGNOSIS — F1010 Alcohol abuse, uncomplicated: Secondary | ICD-10-CM | POA: Diagnosis not present

## 2022-12-01 DIAGNOSIS — N401 Enlarged prostate with lower urinary tract symptoms: Secondary | ICD-10-CM

## 2022-12-01 DIAGNOSIS — D6869 Other thrombophilia: Secondary | ICD-10-CM | POA: Diagnosis not present

## 2022-12-01 DIAGNOSIS — I10 Essential (primary) hypertension: Secondary | ICD-10-CM | POA: Diagnosis not present

## 2022-12-01 DIAGNOSIS — I480 Paroxysmal atrial fibrillation: Secondary | ICD-10-CM

## 2022-12-01 DIAGNOSIS — I700 Atherosclerosis of aorta: Secondary | ICD-10-CM

## 2022-12-01 DIAGNOSIS — Z0000 Encounter for general adult medical examination without abnormal findings: Secondary | ICD-10-CM

## 2022-12-01 DIAGNOSIS — I5022 Chronic systolic (congestive) heart failure: Secondary | ICD-10-CM

## 2022-12-01 DIAGNOSIS — N138 Other obstructive and reflux uropathy: Secondary | ICD-10-CM

## 2022-12-01 DIAGNOSIS — J432 Centrilobular emphysema: Secondary | ICD-10-CM | POA: Diagnosis not present

## 2022-12-01 DIAGNOSIS — E041 Nontoxic single thyroid nodule: Secondary | ICD-10-CM

## 2022-12-01 DIAGNOSIS — E782 Mixed hyperlipidemia: Secondary | ICD-10-CM

## 2022-12-01 DIAGNOSIS — I4891 Unspecified atrial fibrillation: Secondary | ICD-10-CM

## 2022-12-01 DIAGNOSIS — J410 Simple chronic bronchitis: Secondary | ICD-10-CM

## 2022-12-01 MED ORDER — TRELEGY ELLIPTA 100-62.5-25 MCG/ACT IN AEPB: 1.0000 | INHALATION_SPRAY | Freq: Every day | RESPIRATORY_TRACT | 11 refills | Status: DC

## 2022-12-01 MED ORDER — ALBUTEROL SULFATE HFA 108 (90 BASE) MCG/ACT IN AERS
INHALATION_SPRAY | RESPIRATORY_TRACT | 2 refills | Status: AC
Start: 2022-12-01 — End: ?

## 2022-12-01 MED ORDER — ATORVASTATIN CALCIUM 40 MG PO TABS: ORAL_TABLET | ORAL | 3 refills | Status: DC

## 2022-12-01 NOTE — Patient Instructions (Signed)
We will change the inhaler to trelegy from stiolto to try. Let us know if this helps or not.

## 2022-12-01 NOTE — Assessment & Plan Note (Signed)
Has quit smoking which is great and encouraged to stay off smoking lifelong.

## 2022-12-01 NOTE — Assessment & Plan Note (Signed)
Upcoming MR prostate with change in PSA with urology.

## 2022-12-01 NOTE — Assessment & Plan Note (Signed)
Flu shot yearly. Pneumonia complete. Shingrix due at pharmacy. Tetanus due 2025. Colonoscopy due 2029. Counseled about sun safety and mole surveillance. Counseled about the dangers of distracted driving. Given 10 year screening recommendations.

## 2022-12-01 NOTE — Assessment & Plan Note (Signed)
Taking metoprolol for rate control and xarelto for stroke prevention. Continue.

## 2022-12-01 NOTE — Assessment & Plan Note (Signed)
Drinking about 2-3 drinks per day at this time. He does not want to cut back at this time.

## 2022-12-01 NOTE — Assessment & Plan Note (Signed)
Recent TSH at goal.

## 2022-12-01 NOTE — Assessment & Plan Note (Signed)
Taking lipitor 40 mg daily and recent labs at goal. Continue.

## 2022-12-01 NOTE — Assessment & Plan Note (Signed)
Taking lipitor and will continue.

## 2022-12-01 NOTE — Assessment & Plan Note (Signed)
No flare today and taking metoprolol and ACE-I. No fluid on exam.

## 2022-12-01 NOTE — Assessment & Plan Note (Signed)
Taking xarelto and no clinical signs of bleeding. Recent labs stable to continue.

## 2022-12-01 NOTE — Assessment & Plan Note (Signed)
BP at goal on lisinopril 5 mg daily and metoprolol 25 mg daily. Recent CMP at goal no change today.

## 2022-12-01 NOTE — Assessment & Plan Note (Signed)
More SOB since covid-19 and will change stioloto to trelegy. New rx done today. Encouraged more exercise.

## 2022-12-01 NOTE — Progress Notes (Signed)
Subjective:   Patient ID: [name], male    DOB: 02/22/1951, 72 y.o.   MRN: [id number]  HPI Here for medicare wellness and physical, no new complaints. Please see A/P for status and treatment of chronic medical problems.   Diet: heart healthy Physical activity: sedentary, does yard work Depression/mood screen: negative Hearing: intact to whispered voice, mild loss bilaterally Visual acuity: grossly normal, due for annual eye exam  ADLs: capable Fall risk: none Home safety: good Cognitive evaluation: intact to orientation, naming, recall and repetition EOL planning: adv directives discussed, in place  [employer] Visit from 12/01/2022 in [facility] [place]  PHQ-2 Total Score 0       Flowsheet Row Office Visit from 12/01/2022 in [facility]  PHQ-9 Total Score 0         05/15/2020    9:40 PM 05/16/2020   10:00 AM 06/21/2020    8:02 AM 11/29/2021    9:50 AM 12/01/2022    9:28 AM  Fall Risk  Falls in the past year?   0 0 0  Was there an injury with Fall?   0 0 0  Fall Risk Category Calculator   0 0 0  Fall Risk Category (Retired)   Low Low   (RETIRED) Patient Fall Risk Level Low fall risk Moderate fall risk  Low fall risk   Fall risk Follow up     Falls evaluation completed   I have personally reviewed and have noted 1. The patient's medical and social history - reviewed today no changes 2. Their use of alcohol, tobacco or illicit drugs 3. Their current medications and supplements 4. The patient's functional ability including ADL's, fall risks, home safety risks and hearing or visual impairment. 5. Diet and physical activities 6. Evidence for depression or mood disorders 7. Care team reviewed and updated 8.  The patient is not on an opioid pain medication.  Patient Care Team: Myrlene Broker, MD as PCP - General (Internal Medicine) Jake Bathe, MD as PCP - Cardiology (Cardiology) Lajuana Carry (Inactive) as Physician Assistant (Neurology) Karie Soda, MD as Attending Physician (General Surgery) Jake Bathe, MD as Consulting Physician (Cardiology) Kathyrn Sheriff, [facility] (Inactive) as Pharmacist (Pharmacist) Past Medical History:  Diagnosis Date   Adenomatous colon polyp    Alcohol use    Antiphospholipid antibody positive    ???In the past???but not proven according to patient   Antiphospholipid antibody positive    ??? in the past, but not proven according to the patient.   Atrial flutter (HCC)    atrial flutter ablation,   Dr.Allred October 18, 2010   BPH (benign prostatic hypertrophy)    CAD (coronary artery disease)    Catheterization 2005 disease / catheterization July 06, 2010, chronic total occlusion distal RCA with collaterals from right and left side.  Medical therapy recommended, mild decreased LV function        Carotid artery disease (HCC)    0-39% R. ICA, 40-59% LICA... Doppler.... January, 2010  /  Doppler... in January, 2011 no change  /  doppler1/27/2012...stable  0-39% RICA 40-59% LICA   CHF (congestive heart failure) (HCC)    COPD (chronic obstructive pulmonary disease) (HCC)    CRAO (central retinal artery occlusion)    Ejection fraction     EF 50-55%, echo, March, 2012... inferobasal and distal septal hypokinesis /      60%, echo, November, 2008  GERD (gastroesophageal reflux disease)    Hypercholesterolemia    Hypertension    Myocardial infarction (HCC)    Nonsustained ventricular tachycardia (HCC)    Palpitations    probable SVT, rate 170,, at home,, lasted one hour,, 2012   Prostate cancer (HCC)    Raynaud's syndrome    Stroke (HCC)    mild stroke per pt    TIA (transient ischemic attack)    Possible small vessel tia in the past.   Tobacco abuse    Past Surgical History:  Procedure Laterality Date   CARDIAC CATHETERIZATION  06/27/2003   2005.... mild irregularity of the LAD..( patient had had abnormal Myoview scan)    CARDIOVERSION N/A 03/06/2016   Procedure: CARDIOVERSION;  Surgeon: Wendall Stade, MD;  Location: [facility] ENDOSCOPY;  Service: Cardiovascular;  Laterality: N/A;   INGUINAL HERNIA REPAIR  2009   right with mesh.  Dr Carolynne Edouard   TEE WITHOUT CARDIOVERSION N/A 03/06/2016   Procedure: TRANSESOPHAGEAL ECHOCARDIOGRAM (TEE);  Surgeon: Wendall Stade, MD;  Location: [facility] ENDOSCOPY;  Service: Cardiovascular;  Laterality: N/A;   THYROGLOSSAL DUCT CYST  2006   Dr. Annalee Genta   TRANSURETHRAL RESECTION OF PROSTATE N/A 05/15/2020   Procedure: TRANSURETHRAL RESECTION OF THE PROSTATE (TURP);  Surgeon: Bjorn Pippin, MD;  Location: [facility] ORS;  Service: Urology;  Laterality: N/A;   Family History  Adopted: Yes  Family history unknown: Yes    Review of Systems  Constitutional: Negative.   HENT: Negative.    Eyes: Negative.   Respiratory:  Positive for shortness of breath. Negative for cough and chest tightness.   Cardiovascular:  Negative for chest pain, palpitations and leg swelling.  Gastrointestinal:  Negative for abdominal distention, abdominal pain, constipation, diarrhea, nausea and vomiting.  Musculoskeletal: Negative.   Skin: Negative.   Neurological: Negative.   Psychiatric/Behavioral: Negative.      Objective:  Physical Exam Constitutional:      Appearance: He is well-developed.  HENT:     Head: Normocephalic and atraumatic.  Cardiovascular:     Rate and Rhythm: Normal rate and regular rhythm.  Pulmonary:     Effort: Pulmonary effort is normal. No respiratory distress.     Breath sounds: Normal breath sounds. No wheezing or rales.  Abdominal:     General: Bowel sounds are normal. There is no distension.     Palpations: Abdomen is soft.     Tenderness: There is no abdominal tenderness. There is no rebound.  Musculoskeletal:     Cervical back: Normal range of motion.  Skin:    General: Skin is warm and dry.  Neurological:     Mental Status: He is alert and oriented to person, place, and time.      Coordination: Coordination normal.     Vitals:   12/01/22 0926  BP: 118/60  Pulse: (!) 58  Temp: 98 F (36.7 C)  TempSrc: Oral  SpO2: 95%  Weight: 172 lb (78 kg)  Height: 6\' 2"  (1.88 m)    Assessment & Plan:

## 2022-12-23 ENCOUNTER — Ambulatory Visit
Admission: RE | Admit: 2022-12-23 | Discharge: 2022-12-23 | Disposition: A | Discharge status: Home / Self Care | Payer: Medicare HMO | Source: Ambulatory Visit | Attending: Urology | Admitting: Urology

## 2022-12-23 DIAGNOSIS — R972 Elevated prostate specific antigen [PSA]: Secondary | ICD-10-CM | POA: Diagnosis not present

## 2022-12-23 MED ORDER — GADOPICLENOL 0.5 MMOL/ML IV SOLN
7.5000 mL | Freq: Once | INTRAVENOUS | Status: AC | PRN
  Administered 2022-12-23: 7.5 mL via INTRAVENOUS

## 2023-03-16 DIAGNOSIS — C61 Malignant neoplasm of prostate: Secondary | ICD-10-CM | POA: Diagnosis not present

## 2023-03-23 DIAGNOSIS — N403 Nodular prostate with lower urinary tract symptoms: Secondary | ICD-10-CM | POA: Diagnosis not present

## 2023-03-23 DIAGNOSIS — C61 Malignant neoplasm of prostate: Secondary | ICD-10-CM | POA: Diagnosis not present

## 2023-03-23 DIAGNOSIS — R3912 Poor urinary stream: Secondary | ICD-10-CM | POA: Diagnosis not present

## 2023-03-24 ENCOUNTER — Other Ambulatory Visit (HOSPITAL_COMMUNITY): Payer: Self-pay | Admitting: Urology

## 2023-03-24 DIAGNOSIS — C61 Malignant neoplasm of prostate: Secondary | ICD-10-CM

## 2023-03-25 DIAGNOSIS — L570 Actinic keratosis: Secondary | ICD-10-CM | POA: Diagnosis not present

## 2023-03-25 DIAGNOSIS — L812 Freckles: Secondary | ICD-10-CM | POA: Diagnosis not present

## 2023-03-25 DIAGNOSIS — D0321 Melanoma in situ of right ear and external auricular canal: Secondary | ICD-10-CM | POA: Diagnosis not present

## 2023-03-25 DIAGNOSIS — D225 Melanocytic nevi of trunk: Secondary | ICD-10-CM | POA: Diagnosis not present

## 2023-03-25 DIAGNOSIS — D485 Neoplasm of uncertain behavior of skin: Secondary | ICD-10-CM | POA: Diagnosis not present

## 2023-03-25 DIAGNOSIS — Z85828 Personal history of other malignant neoplasm of skin: Secondary | ICD-10-CM | POA: Diagnosis not present

## 2023-03-25 DIAGNOSIS — L821 Other seborrheic keratosis: Secondary | ICD-10-CM | POA: Diagnosis not present

## 2023-03-31 ENCOUNTER — Encounter (HOSPITAL_COMMUNITY)
Admission: RE | Admit: 2023-03-31 | Discharge: 2023-03-31 | Disposition: A | Discharge status: Home / Self Care | Payer: PPO | Source: Ambulatory Visit | Attending: Urology | Admitting: Urology

## 2023-03-31 DIAGNOSIS — C61 Malignant neoplasm of prostate: Secondary | ICD-10-CM | POA: Insufficient documentation

## 2023-03-31 DIAGNOSIS — R972 Elevated prostate specific antigen [PSA]: Secondary | ICD-10-CM | POA: Diagnosis not present

## 2023-03-31 DIAGNOSIS — J439 Emphysema, unspecified: Secondary | ICD-10-CM | POA: Diagnosis not present

## 2023-03-31 MED ORDER — FLOTUFOLASTAT F 18 GALLIUM 296-5846 MBQ/ML IV SOLN
8.6000 | Freq: Once | INTRAVENOUS | Status: AC
  Administered 2023-03-31: 8.6 via INTRAVENOUS
  Filled 2023-03-31: qty 9

## 2023-04-09 ENCOUNTER — Telehealth: Payer: Self-pay

## 2023-04-09 NOTE — Telephone Encounter (Signed)
I called pt to introduce myself as the Coordinator of the [facility].   1. I confirmed with the patient he is aware of his referral to the clinic 2/11, arriving @ 12:30 pm.    2. I discussed the format of the clinic and the physicians he will be seeing that day.   3. I discussed where the clinic is located and how to contact me.   4. I confirmed his address and informed him I would be mailing a packet of information and forms to be completed. I asked him to bring them with him the day of his appointment.    He voiced understanding of the above. I asked him to call me if he has any questions or concerns regarding his appointments or the forms he needs to complete.

## 2023-04-27 DIAGNOSIS — Z8546 Personal history of malignant neoplasm of prostate: Secondary | ICD-10-CM | POA: Insufficient documentation

## 2023-04-27 DIAGNOSIS — C61 Malignant neoplasm of prostate: Secondary | ICD-10-CM | POA: Insufficient documentation

## 2023-04-27 NOTE — Progress Notes (Signed)
                               Care Plan Summary  Name: [name] DOB: 1950/07/13   Your Medical Team:   Urologist -  Dr. Heloise Purpura, [facility]  Radiation Oncologist - Dr. Margaretmary Dys, [facility]   Medical Oncologist - Dr. Geanie Berlin, [facility]  Recommendations: 1) Repeat Prostate Biopsy  2) Radiation     * These recommendations are based on information available as of today's consult.      Recommendations may change depending on the results of further tests or exams.    Next Steps: 1) You will be contacted by [facility] to get scheduled for a biopsy with Dr. Annabell Howells.    When appointments need to be scheduled, you will be contacted by [facility] and/or [facility].  Questions?  Please do not hesitate to call Cherlyn Cushing, BSN, RN at [phone] with any questions or concerns.  Marisue Ivan is your Oncology Nurse Navigator and is available to assist you while you're receiving your medical care at [facility].

## 2023-04-27 NOTE — Progress Notes (Signed)
[name] CONSULT NOTE  Patient Care Team: Myrlene Broker, MD as PCP - General (Internal Medicine) Jake Bathe, MD as PCP - Cardiology (Cardiology) Mayans, Onalee Hua (Inactive) as Physician Assistant (Neurology) Karie Soda, MD as Attending Physician (General Surgery) Jake Bathe, MD as Consulting Physician (Cardiology) Kathyrn Sheriff, [facility] (Inactive) as Pharmacist (Pharmacist) Cherlyn Cushing, RN as Oncology Nurse Navigator  ASSESSMENT & PLAN:  [name] is a 73 y.o.male with history of paroxysmal atrial fibrillation, chronic systolic heart failure with resolution of EF, alcohol use, CAD, carotid artery disease, COPD, hypertension, hyperlipidemia, stroke, tobacco abuse being seen at [facility] for prostate cancer.  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 PSA down to 1.94 post resection.  Last MRI from 12/2022 reported no focal lesion of intermediate or higher suspicion for prostate cancer is identified. MRI did not show clear lesion. PSMA PET from 03/2023 showed focal activity in the LEFT lobe of the prostate gland concerning for primary prostate adenocarcinoma. PSA was 4.63 in 02/2023. It was 3.92 in 08/2022 and 3/06 in 02/2022  Current diagnosis: cTxN0M0 PSA 4.63. history of GG2 in highest score from TURP Previous diagnosis: 2021 cT1cNxMx 3+3=6 GG1 on biopsy. 2022 GG2 Germline testing: NA Somatic testing: NA Treatment: to be determined  His case was discussed at tumor board with multiple specialists including radiation oncologist, urology oncologist, pathologist, radiologist. The patient was counseled on the natural history of prostate cancer and the standard treatment options that are available for prostate cancer.   Recommend repeat biopsy for staging and based on risk group provide treatment recommendations.  Patient will follow-up with radiation oncology or urologic oncology for  definitive treatment.  He may follow-up with medical oncology as needed.  All questions were answered. The patient knows to call the clinic with any problems, questions or concerns. No barriers to learning was detected.  Melven Sartorius, MD 2/10/20258:48 PM  CHIEF COMPLAINTS/PURPOSE OF CONSULTATION:  Prostate cancer  HISTORY OF PRESENTING ILLNESS:  [name] 73 y.o. male is here because of prostate cancer.  I have reviewed his chart and materials related to his cancer extensively and collaborated history with the patient. Summary of oncologic history is as follows:  He denies any urinary symptoms such as dysuria, hematuria or difficulty urinating. He has increased frequency and weak stream.  Oncology History   No history exists.    MEDICAL HISTORY:  Past Medical History:  Diagnosis Date   Adenomatous colon polyp    Alcohol use    Antiphospholipid antibody positive    ???In the past???but not proven according to patient   Antiphospholipid antibody positive    ??? in the past, but not proven according to the patient.   Atrial flutter (HCC)    atrial flutter ablation,   Dr.Allred October 18, 2010   BPH (benign prostatic hypertrophy)    CAD (coronary artery disease)    Catheterization 2005 disease / catheterization July 06, 2010, chronic total occlusion distal RCA with collaterals from right and left side.  Medical therapy recommended, mild decreased LV function        Carotid artery disease (HCC)    0-39% R. ICA, 40-59% LICA... Doppler.... January, 2010  /  Doppler... in January, 2011 no change  /  doppler1/27/2012...stable  0-39% RICA 40-59% LICA   CHF (congestive heart failure) (HCC)    COPD (chronic obstructive pulmonary disease) (HCC)    CRAO (  central retinal artery occlusion)    Ejection fraction     EF 50-55%, echo, March, 2012... inferobasal and distal septal hypokinesis /      60%, echo, November, 2008   GERD (gastroesophageal reflux disease)    Hypercholesterolemia     Hypertension    Myocardial infarction (HCC)    Nonsustained ventricular tachycardia (HCC)    Palpitations    probable SVT, rate 170,, at home,, lasted one hour,, 2012   Prostate cancer (HCC)    Raynaud's syndrome    Stroke (HCC)    mild stroke per pt    TIA (transient ischemic attack)    Possible small vessel tia in the past.   Tobacco abuse     SURGICAL HISTORY: Past Surgical History:  Procedure Laterality Date   CARDIAC CATHETERIZATION  06/27/2003   2005.... mild irregularity of the LAD..( patient had had abnormal Myoview scan)   CARDIOVERSION N/A 03/06/2016   Procedure: CARDIOVERSION;  Surgeon: Wendall Stade, MD;  Location: [facility] ENDOSCOPY;  Service: Cardiovascular;  Laterality: N/A;   INGUINAL HERNIA REPAIR  2009   right with mesh.  Dr Carolynne Edouard   TEE WITHOUT CARDIOVERSION N/A 03/06/2016   Procedure: TRANSESOPHAGEAL ECHOCARDIOGRAM (TEE);  Surgeon: Wendall Stade, MD;  Location[facility] ENDOSCOPY;  Service: Cardiovascular;  Laterality: N/A;   THYROGLOSSAL DUCT CYST  2006   Dr. Annalee Genta   TRANSURETHRAL RESECTION OF PROSTATE N/A 05/15/2020   Procedure: TRANSURETHRAL RESECTION OF THE PROSTATE (TURP);  Surgeon: Bjorn Pippin, MD;  Location: [facility] ORS;  Service: Urology;  Laterality: N/A;    SOCIAL HISTORY: Social History   Socioeconomic History   Marital status: Married    Spouse name: Not on file   Number of children: Not on file   Years of education: Not on file   Highest education level: Not on file  Occupational History   Occupation: land Administrator retired  Tobacco Use   Smoking status: Former    Current packs/day: 0.00    Average packs/day: 0.5 packs/day for 54.0 years (27.0 ttl pk-yrs)    Types: Cigarettes    Start date: 83    Quit date: 03/17/2022    Years since quitting: 1.1   Smokeless tobacco: Never   Tobacco comments:    11/30/19 .5-1pack per day  Vaping Use   Vaping status: Never Used  Substance and Sexual Activity   Alcohol use: Yes    Alcohol/week: 14.0 - 21.0  standard drinks of alcohol    Types: 14 - 21 Cans of beer per week   Drug use: No   Sexual activity: Not Currently  Other Topics Concern   Not on file  Social History Narrative   Not on file   Social Drivers of Health   Financial Resource Strain: Medium Risk (10/17/2019)   Overall Financial Resource Strain (CARDIA)    Difficulty of Paying Living Expenses: Somewhat hard  Food Insecurity: No Food Insecurity (08/03/2018)   Hunger Vital Sign    Worried About Running Out of Food in the Last Year: Never true    Ran Out of Food in the Last Year: Never true  Transportation Needs: No Transportation Needs (08/03/2018)   PRAPARE - Administrator, Civil Service (Medical): No    Lack of Transportation (Non-Medical): No  Physical Activity: Sufficiently Active (08/03/2018)   Exercise Vital Sign    Days of Exercise per Week: 6 days    Minutes of Exercise per Session: 50 min  Stress: No Stress Concern Present (08/03/2018)  [employer] of Occupational Health - Occupational Stress Questionnaire    Feeling of Stress : Not at all  Social Connections: Unknown (08/03/2018)   Social Connection and Isolation Panel [NHANES]    Frequency of Communication with Friends and Family: More than three times a week    Frequency of Social Gatherings with Friends and Family: More than three times a week    Attends Religious Services: Not on file    Active Member of Clubs or Organizations: Not on file    Attends Banker Meetings: Not on file    Marital Status: Married  Intimate Partner Violence: Not At Risk (08/03/2018)   Humiliation, Afraid, Rape, and Kick questionnaire    Fear of Current or Ex-Partner: No    Emotionally Abused: No    Physically Abused: No    Sexually Abused: No    FAMILY HISTORY: Family History  Adopted: Yes  Family history unknown: Yes    ALLERGIES:  is allergic to cialis [tadalafil], levitra [vardenafil], and viagra [sildenafil citrate].  MEDICATIONS:   Current Outpatient Medications  Medication Sig Dispense Refill   albuterol (VENTOLIN HFA) 108 (90 Base) MCG/ACT inhaler INHALE TWO PUFFS BY MOUTH INTO LUNGS every FOUR hours AS NEEDED FOR WHEEZING AND/OR SHORTNESS OF BREATH 8.5 g 2   amiodarone (PACERONE) 200 MG tablet TAKE 1/2 TABLET BY MOUTH ONCE DAILY 45 tablet 3   atorvastatin (LIPITOR) 40 MG tablet Take one tablet by mouth once daily 90 tablet 3   b complex vitamins capsule Take 1 capsule by mouth daily.     Fluticasone-Umeclidin-Vilant (TRELEGY ELLIPTA) 100-62.5-25 MCG/ACT AEPB Inhale 1 puff into the lungs daily. 1 each 11   lisinopril (ZESTRIL) 5 MG tablet Take 1 tablet (5 mg total) by mouth daily. 90 tablet 3   metoprolol succinate (TOPROL-XL) 25 MG 24 hr tablet Take 1 tablet (25 mg total) by mouth daily. 90 tablet 3   Multiple Vitamin (MULTIVITAMIN WITH MINERALS) TABS tablet Take 1 tablet by mouth daily. 30 tablet 0   rivaroxaban (XARELTO) 20 MG TABS tablet Take 1 tablet (20 mg total) by mouth daily. 90 tablet 1   No current facility-administered medications for this visit.    REVIEW OF SYSTEMS:   All relevant systems were reviewed with the patient and are negative.  PHYSICAL EXAMINATION:  GENERAL: alert, no distress and comfortable  LABORATORY DATA:  I have reviewed the data as listed Lab Results  Component Value Date   WBC 8.1 11/19/2022   HGB 15.9 11/19/2022   HCT 46.2 11/19/2022   MCV 94 11/19/2022   PLT 276 11/19/2022   Recent Labs    05/13/22 0909 11/19/22 0905  NA 138 136  K 4.4 4.5  CL 101 101  CO2 22 24  GLUCOSE 113* 94  BUN 19 18  CREATININE 1.20 1.30*  CALCIUM 9.4 9.8  PROT 6.3 6.9  ALBUMIN 4.4 4.4  AST 18 23  ALT 26 25  ALKPHOS 88 82  BILITOT 0.4 0.7    RADIOGRAPHIC STUDIES: I have personally reviewed the radiological images as listed and agreed with the findings in the report. NM PET (PSMA) SKULL TO MID THIGH Result Date: 04/06/2023 CLINICAL DATA:  Elevated PSA equal 4.63. EXAM: NUCLEAR  MEDICINE PET SKULL BASE TO THIGH TECHNIQUE: 8.6 mCi Flotufolastat (Posluma) was injected intravenously. Full-ring PET imaging was performed from the skull base to thigh after the radiotracer. CT data was obtained and used for attenuation correction and anatomic localization. COMPARISON:  Prostate MRI 12/23/2022 FINDINGS:  NECK No radiotracer activity in neck lymph nodes. Incidental CT finding: None. CHEST No radiotracer accumulation within mediastinal or hilar lymph nodes. No suspicious pulmonary nodules on the CT scan. Incidental CT finding: Centrilobular seen in the upper lobes. Bullous change in the lower RIGHT upper lobe. Mucoid material within the distal trachea. ABDOMEN/PELVIS Prostate: Focal activity inferior aspect of the LEFT lobe of the prostate gland SUV max equal 11.0 on image 198. Activity more centrally in the gland relates to TURP defect. Lymph nodes: No abnormal radiotracer accumulation within pelvic or abdominal nodes. Liver: No evidence of liver metastasis. Incidental CT finding: None. SKELETON No focal activity to suggest skeletal metastasis. IMPRESSION: 1. Focal activity in the LEFT lobe of the prostate gland consistent with primary prostate adenocarcinoma. 2. No evidence of metastatic adenopathy in the pelvis or periaortic retroperitoneum. 3. No evidence of visceral metastasis or skeletal metastasis. 4. Emphysema in the lungs. Electronically Signed   By: Genevive Bi M.D.   On: 04/06/2023 17:24

## 2023-04-28 ENCOUNTER — Inpatient Hospital Stay: Payer: HMO

## 2023-04-28 ENCOUNTER — Encounter: Payer: Self-pay | Admitting: Radiation Oncology

## 2023-04-28 ENCOUNTER — Ambulatory Visit
Admission: RE | Admit: 2023-04-28 | Discharge: 2023-04-28 | Disposition: A | Discharge status: Home / Self Care | Payer: HMO | Source: Ambulatory Visit | Attending: Radiation Oncology | Admitting: Radiation Oncology

## 2023-04-28 VITALS — BP 162/75 | HR 67 | Temp 97.3°F | Resp 18 | Ht 74.0 in | Wt 170.8 lb

## 2023-04-28 DIAGNOSIS — C61 Malignant neoplasm of prostate: Secondary | ICD-10-CM

## 2023-04-28 DIAGNOSIS — Z191 Hormone sensitive malignancy status: Secondary | ICD-10-CM | POA: Diagnosis not present

## 2023-04-28 HISTORY — DX: Unspecified retinal vascular occlusion: H34.9

## 2023-04-28 HISTORY — DX: Emphysema, unspecified: J43.9

## 2023-04-28 NOTE — Consult Note (Signed)
[facility]     04/28/2023   --------------------------------------------------------------------------------   Milda Smart. Muscatello  MRN: [id number]  DOB: September 30, 1950, 73 year old Male  SSN: [id number]   PRIMARY CARE:  Hillard Danker  PRIMARY CARE FAX:  [phone]  REFERRING:  Bjorn Pippin, MD  PROVIDER:  Bjorn Pippin, M.D.  TREATING:  Heloise Purpura, M.D.  LOCATION:  [facility] [phone]     --------------------------------------------------------------------------------   CC/HPI: CC: Prostate Cancer   Physician requesting consult: Dr. Bjorn Pippin  PCP: Dr. Hillard Danker  Location of consult: [facility] - Prostate Cancer [facility]   Mr. Virgin is a 73 year old gentleman with a history of COPD, atrial fibrillation (on Xarelto), hypertension, hyperlipidemia, and a TIA. 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 He elected active surveillance management. He eventually underwent a TURP for his BPH on 05/15/20 and this revealed Gleason 3+4=7 adenocarcinoma incidentally in < 5% of the prostate chips. 90% was Gleason pattern 3 with only 10% pattern 4. He elected to continue with active surveillance management. His PSA dropped to 1.94 s/p TURP but then began steadily increasing again and was up to 4.63 on 03/16/23. An MRI of the prostate on 12/23/22 had demonstrated a 19 cc gland with no suspicious lesions for high grade disease. A PSMA PET scan on 03/31/23 was negative for metastatic disease with uptake within the left lobe of the prostate.   Family history: None.   Imaging studies:  MRI (12/23/22): No EPE, SVI, LAD, or bone lesions.  PSMA PET scan (03/31/23): No metastatic disease. There is significant uptake on the left side of the prostate that is concerning although is  somewhat inconsistent with his MRI result.   PMH: He has a history of COPD, atrial fibrillation (Xarelto), hypertension, hyperlipidemia, and TIA.  PSH: Inguinal hernia repairs.   TNM stage: cT1b N0 M0  PSA: 4.63  Gleason score: 3+4=7 (GG 2)  Biopsy (09/15/19): 1/12 cores positive  Left: L apex (14%, 3+3=6)  Right: Benign  Prostate volume: 19 cc  PSAD: 0.24   Urinary function: IPSS is 16.  Erectile function: SHIM score is 5. This is a low priority for him.     ALLERGIES: No Allergies    MEDICATIONS: Metoprolol Succinate 50 mg tablet, extended release 24 hr  Amiodarone Hcl 100 mg tablet  Atorvastatin Calcium 40 MG Oral Tablet Oral  Folic Acid 1 mg tablet  Lisinopril 10 MG Oral Tablet Oral  Multivitamin  Vitamin B6 1 PO Daily  Xarelto 20 mg tablet     Notes: He is on a new inhaler for COPD, possible atrovent or albuterol.   GU PSH: Complex Uroflow - 2021 Cystoscopy - 2021 Cystoscopy TURP - 2022 Prostate Needle Biopsy - 2021       PSH Notes: Cardiac Catheter His Ablation, Inguinal Hernia Repair, Inguinal Hernia Repair, Thyroid Surgery   NON-GU PSH: Knee Arthroscopy, Right - 02/14/2022 Surgical Pathology, Gross And Microscopic Examination For Prostate Needle - 2021 Visit Complexity (formerly GPC1X) - 03/23/2023, 09/15/2022     GU PMH: Elevated PSA (Stable) - 03/23/2023, - 09/15/2022, - 03/12/2022, - 08/26/2021, - 02/25/2021, - 2022, Down as expected. , - 2022, - 2021, His PSA has been rising over the last 9 years and was up to 3.79 in May  but his exam is benign. I will repeat in 1 month after the antibiotics. , - 2019 Prostate Cancer, He has T1b GG2 cancer in a small prostate with a PSADT of about 21mo. He is reluctant to have a biopsy. I will get a PSMA PET to better get a sense of the extent of the cancer. - 03/23/2023, His PSA continues to rise with a 2 year PSADT. I discussed options and will repeat the MRIP. If there is a lesion of concern, he will need a repeat biopsy. If the MRI  is negative, he will return in 6 months with a repeat PSA. , - 09/15/2022, He has T1b GG2 prostate cancer with a slowly rising PSA but low risk MRI. He is not inclined to have another biopsy at this time. I will have him return in 6 months with a PSA for an exam. , - 03/12/2022, His PSA is slowly rising s/p TURP. I will get an MRI and consider the need for a surveillance biopsy. , - 08/26/2021, He is doing well but his PSA is up slightly since he stopped the finasteride but the rise is minimal. I will recheck in 6 months. , - 02/25/2021, He had small volume GG2 disease on his TURP. The PSA has fallen nicely with the debulking from the TURP but he just came off of the finasteride so that might go up as a result. he will return 6 months with a PSA. , - 2022, - 2022, His PSA is falling appropriately on finasteride and I will continue to follow the PSA's for now. , - 2022, His PSA has fallen to 4.09 and I will have him return in 3 months with a repeat on finasteride. , - 2021, He has T1c Nx Mx Gleason 6 very low risk prostate cancer with severe ED and moderate to severe LUTS. He has several serious comorbities. I reviewed the Predict Nomogram which suggests a 2% probability of prostate cancer mortality with conservative therapy at 10 years and 1% with radical therapy. I discussed the options for management and associated risks including Active Surveillance, RALP, EXRT, Seeds, Cryo and HIFU. I feel he would be a good candidate for an initial course of active surveillance and he is in agreement. I will have him return in 4 months with a PSA. , - 2021 Prostate nodule w/ LUTS, He has a small left base nodule. I will assess that with the PET. - 03/23/2023 Weak Urinary Stream - 03/23/2023, - 09/15/2022, - 2022, PF is low on flowmetry today. , - 2021, - 2021 BPH w/LUTS, He continues to have moderate LUTS despite the prior TURP. - 09/15/2022, He has persistent moderate LUTS following a TURP., - 03/12/2022, He is doing well s/p TURP but  still has moderate LUTS with nocturia x 3. , - 08/26/2021, He has stable moderate LUTS s/p TURP but no further dysuria or UUI. , - 02/25/2021, He has mild/mod residual LUTS with some dysuria but the urine is clear. I reassured him. , - 2022, PVR 2ml today, FOS has improved. He continues finasteride., - 2022, He has persistent mod/severe LUTS despite the finasteride and he has failed several prior meds. I discussed options and will get him set up for a TURP. I reviewd the risks of a TURP including bleeding, infection, incontinence, stricture, need for secondary procedures, ejaculatory and erectile dysfunction, thrombotic events, fluid overload and anesthetic complications. I explained that 95% of men will have relief of the obstructive symptoms and about 70% will have  relief of the irritative symptoms. He will need clearance to come off of the xarelto. , - 2022, He has BPH with BOO with a 43ml prostate [place] but a short prostatic urethra without bulky hyperplasia. I discussed options including Urolift, TURP/TUIP, PTNS and finasteride. I didn't discuss Rezum because with his COPD, Nitrous is problematic. At this time I will give him a trial of finasteride and reviewed the side effects. F/u in 3 months. , - 2021, IPSS is 22. , - 2019, Benign prostatic hyperplasia with urinary obstruction, - 2014 Nocturia - 03/12/2022, - 08/26/2021, - 02/25/2021, - 2022, - 2022, - 2021, Nocturia, - 2014 Urge incontinence - 03/12/2022, - 2022 ED due to arterial insufficiency, He is not interested in therapy. - 02/25/2021, He has severe ED. , - 2021 Dysuria - 2022 Gross hematuria - 2022 Proteinuria, He has a normal Cr but if the proteinuria persists at f/u he will need further evaluation. - 2019 Polyuria, Polyuria - 2014      PMH Notes:  1898-03-17 00:00:00 - Note: Normal Routine History And Physical Adult  2012-07-22 15:31:51 - Note: Sinus Arrhythmia   NON-GU PMH: Bacteriuria, Urine culture today. - 2019 Personal history of  other diseases of the circulatory system, History of cardiac disorder - 2014, History of essential hypertension, - 2014 Personal history of other endocrine, nutritional and metabolic disease, History of hypercholesterolemia - 2014 Unspecified atherosclerosis, Atherosclerosis - 2014 Unspecified atrial fibrillation, Atrial Fibrillation - 2014 Hypercholesterolemia Hypertension Transient ischemic attack    FAMILY HISTORY: 2 sons - No Family History adopted - Runs In Family Family Health Status Number - Runs In Family    Notes: PT was adopted no family history    SOCIAL HISTORY: Marital Status: Married Preferred Language: English; Race: White Current Smoking Status: Patient smokes. Smokes 1/2 pack per day.   Tobacco Use Assessment Completed: Used Tobacco in last 30 days? Drinks 3 drinks per day. Types of alcohol consumed: Beer.  Drinks 2 caffeinated drinks per day.     Notes: Tobacco use, Caffeine Use, Alcohol Use, Occupation:, Marital History - Currently Married   REVIEW OF SYSTEMS:    GU Review Male:   Patient denies frequent urination, hard to postpone urination, burning/ pain with urination, get up at night to urinate, leakage of urine, stream starts and stops, trouble starting your streams, and have to strain to urinate .  Gastrointestinal (Upper):   Patient denies nausea and vomiting.  Gastrointestinal (Lower):   Patient denies constipation and diarrhea.  Constitutional:   Patient denies fever, night sweats, weight loss, and fatigue.  Skin:   Patient denies skin rash/ lesion and itching.  Eyes:   Patient denies blurred vision and double vision.  Ears/ Nose/ Throat:   Patient denies sore throat and sinus problems.  Hematologic/Lymphatic:   Patient denies swollen glands and easy bruising.  Cardiovascular:   Patient denies leg swelling and chest pains.  Respiratory:   Patient denies cough and shortness of breath.  Endocrine:   Patient denies excessive thirst.  Musculoskeletal:    Patient denies back pain and joint pain.  Neurological:   Patient denies headaches and dizziness.  Psychologic:   Patient denies depression and anxiety.   VITAL SIGNS: None   MULTI-SYSTEM PHYSICAL EXAMINATION:    Constitutional: Well-nourished. No physical deformities. Normally developed. Good grooming.     Complexity of Data:  Lab Test Review:   PSA  Records Review:   Pathology Reports, Previous Patient Records  X-Ray Review: PET- PSMA Scan: Reviewed Films.  03/16/23 09/08/22 03/05/22 08/19/21 02/18/21 08/22/20 04/24/20 01/30/20  PSA  Total PSA 4.63 ng/mL 3.92 ng/mL 3.06 ng/mL 2.56 ng/mL 2.21 ng/mL 1.94 ng/mL 2.29 ng/mL 4.09 ng/mL  Free PSA        0.52 ng/mL  % Free PSA        13 % PSA   Notes:                     CLINICAL DATA: Elevated PSA equal 4.63.   EXAM:  NUCLEAR MEDICINE PET SKULL BASE TO THIGH   TECHNIQUE:  8.6 mCi Flotufolastat (Posluma) was injected intravenously.  Full-ring PET imaging was performed from the skull base to thigh  after the radiotracer. CT data was obtained and used for attenuation  correction and anatomic localization.   COMPARISON: Prostate MRI 12/23/2022   FINDINGS:  NECK   No radiotracer activity in neck lymph nodes.   Incidental CT finding: None.   CHEST   No radiotracer accumulation within mediastinal or hilar lymph nodes.  No suspicious pulmonary nodules on the CT scan.   Incidental CT finding: Centrilobular seen in the upper lobes.  Bullous change in the lower RIGHT upper lobe. Mucoid material within  the distal trachea.   ABDOMEN/PELVIS   Prostate: Focal activity inferior aspect of the LEFT lobe of the  prostate gland SUV max equal 11.0 on image 198.   Activity more centrally in the gland relates to TURP defect.   Lymph nodes: No abnormal radiotracer accumulation within pelvic or  abdominal nodes.   Liver: No evidence of liver metastasis.   Incidental CT finding: None.   SKELETON   No focal activity to suggest  skeletal metastasis.   IMPRESSION:  1. Focal activity in the LEFT lobe of the prostate gland consistent  with primary prostate adenocarcinoma.  2. No evidence of metastatic adenopathy in the pelvis or periaortic  retroperitoneum.  3. No evidence of visceral metastasis or skeletal metastasis.  4. Emphysema in the lungs.    Electronically Signed  By: Genevive Bi M.D.  On: 04/06/2023 17:24   PROCEDURES: None   ASSESSMENT:      ICD-10 Details  1 GU:   Prostate Cancer - C61    PLAN:           Document Letter(s):  Created for Patient: Clinical Summary         Notes:   1. Prostate cancer: Mr. Hillyard is scheduled to meet with both Dr. Cherly Hensen and Dr. Kathrynn Running also this afternoon. I had a detailed discussion with him and his wife today. Considering some of the inconsistent findings between his MRI and his PSMA PET scan, this does raise some doubt as to whether he may be developing progression of his cancer or not. However, his PSA trajectory is concerning. As such, I did recommend that he proceed with a repeat prostate biopsy with good sampling of the left side of the prostate based on his PSMA PET scan. If he does have progressive/upgraded disease, he is adamant that he would not want to proceed with surgical therapy and is most interested in radiation therapy if curative treatment is indicated. I will make Dr. Annabell Howells aware of our discussion today. He would like to consider oral Valium prior to his next biopsy as he did have a difficult time with the last biopsy.   CC: Dr. Hillard Danker  Dr. Bjorn Pippin  Dr. Margaretmary Dys  Dr. Geanie Berlin        Next Appointment:  Next Appointment: 09/14/2023 09:45 AM    Appointment Type: Laboratory Appointment    Location: [facility] [phone]    Provider: Lab LAB    Reason for Visit: 6 months - psa      E & M CODES: We spent 42 minutes dedicated to evaluation and management time, including face to face interaction,  discussions on coordination of care, documentation, result review, and discussion with others as applicable.

## 2023-04-28 NOTE — Progress Notes (Signed)
Radiation Oncology         ([phone]) [phone] ________________________________  Multidisciplinary Prostate Cancer Clinic  Initial Radiation Oncology Consultation  Name: [name] MRN: [id number]  Date: 04/28/2023  DOB: 05-Feb-1951  WU:[id number], [name], MD  Bjorn Pippin, MD   REFERRING PHYSICIAN: Bjorn Pippin, MD  DIAGNOSIS: 73 y.o. gentleman with stage T1c adenocarcinoma of the prostate with a Gleason's score of 3+4 and a PSA of 4.63  No diagnosis found.  HISTORY OF PRESENT ILLNESS::[name] is a 73 y.o. gentleman. He was initially diagnosed with Gleason 3+3 involving one core with 14% of tissue in 09/2019 under Dr. Annabell Howells, with a PSA of 4.39.     He was appropriately placed on active surveillance at that time. He developed BOO in 2022, prompting TURP on 05/15/20. Pathology from the procedure showed a small focus of Gleason 3+4 disease.    His PSA dropped following the procedure. Surveillance prostate MRIs on 09/10/21 and 12/23/22 were negative. He continued active surveillance. More recently, his PSA rose to 4.63 in 02/2023. Given patient's comorbidities and reluctance to proceed with repeat biopsy, he underwent PSMA PET scan on 03/31/23 showing only focal activity in left lobe of prostate, no evidence of disease outside of the gland.     The patient reviewed the biopsy results with his urologist and he has kindly been referred today to the multidisciplinary prostate cancer clinic for presentation of pathology and radiology studies in our conference for discussion of potential radiation treatment options and clinical evaluation.    ***.  PREVIOUS RADIATION THERAPY: No  PAST MEDICAL HISTORY:  has a past medical history of Adenomatous colon polyp, Alcohol use, Antiphospholipid antibody positive, Antiphospholipid antibody positive, Atrial flutter (HCC), BPH (benign prostatic hypertrophy), CAD (coronary artery disease), Carotid artery disease (HCC), CHF (congestive heart failure)  (HCC), COPD (chronic obstructive pulmonary disease) (HCC), CRAO (central retinal artery occlusion), Ejection fraction, GERD (gastroesophageal reflux disease), Hypercholesterolemia, Hypertension, Myocardial infarction (HCC), Nonsustained ventricular tachycardia (HCC), Palpitations, Prostate cancer (HCC), Raynaud's syndrome, Stroke (HCC), TIA (transient ischemic attack), and Tobacco abuse.    PAST SURGICAL HISTORY: Past Surgical History:  Procedure Laterality Date   CARDIAC CATHETERIZATION  06/27/2003   2005.... mild irregularity of the LAD..( patient had had abnormal Myoview scan)   CARDIOVERSION N/A 03/06/2016   Procedure: CARDIOVERSION;  Surgeon: Wendall Stade, MD;  Location: [facility] ENDOSCOPY;  Service: Cardiovascular;  Laterality: N/A;   INGUINAL HERNIA REPAIR  2009   right with mesh.  Dr Carolynne Edouard   TEE WITHOUT CARDIOVERSION N/A 03/06/2016   Procedure: TRANSESOPHAGEAL ECHOCARDIOGRAM (TEE);  Surgeon: Wendall Stade, MD;  Location: [facility] ENDOSCOPY;  Service: Cardiovascular;  Laterality: N/A;   THYROGLOSSAL DUCT CYST  2006   Dr. Annalee Genta   TRANSURETHRAL RESECTION OF PROSTATE N/A 05/15/2020   Procedure: TRANSURETHRAL RESECTION OF THE PROSTATE (TURP);  Surgeon: Bjorn Pippin, MD;  Location: [facility] ORS;  Service: Urology;  Laterality: N/A;    FAMILY HISTORY: He was adopted. Family history is unknown by patient.  SOCIAL HISTORY:  reports that he quit smoking about 13 months ago. His smoking use included cigarettes. He started smoking about 55 years ago. He has a 27 pack-year smoking history. He has never used smokeless tobacco. He reports current alcohol use of about 14.0 - 21.0 standard drinks of alcohol per week. He reports that he does not use drugs.  ALLERGIES: Cialis [tadalafil], Levitra [vardenafil], and Viagra [sildenafil citrate]  MEDICATIONS:  Current Outpatient Medications  Medication Sig Dispense Refill   albuterol (VENTOLIN  HFA) 108 (90 Base) MCG/ACT inhaler INHALE TWO PUFFS BY MOUTH INTO LUNGS  every FOUR hours AS NEEDED FOR WHEEZING AND/OR SHORTNESS OF BREATH 8.5 g 2   amiodarone (PACERONE) 200 MG tablet TAKE 1/2 TABLET BY MOUTH ONCE DAILY 45 tablet 3   atorvastatin (LIPITOR) 40 MG tablet Take one tablet by mouth once daily 90 tablet 3   b complex vitamins capsule Take 1 capsule by mouth daily.     Fluticasone-Umeclidin-Vilant (TRELEGY ELLIPTA) 100-62.5-25 MCG/ACT AEPB Inhale 1 puff into the lungs daily. 1 each 11   lisinopril (ZESTRIL) 5 MG tablet Take 1 tablet (5 mg total) by mouth daily. 90 tablet 3   metoprolol succinate (TOPROL-XL) 25 MG 24 hr tablet Take 1 tablet (25 mg total) by mouth daily. 90 tablet 3   Multiple Vitamin (MULTIVITAMIN WITH MINERALS) TABS tablet Take 1 tablet by mouth daily. 30 tablet 0   rivaroxaban (XARELTO) 20 MG TABS tablet Take 1 tablet (20 mg total) by mouth daily. 90 tablet 1   No current facility-administered medications for this encounter.    REVIEW OF SYSTEMS:  On review of systems, the patient reports that he is doing well overall. He denies any chest pain, shortness of breath, cough, fevers, chills, night sweats, unintended weight changes. He denies any bowel disturbances, and denies abdominal pain, nausea or vomiting. He denies any new musculoskeletal or joint aches or pains. His IPSS was ***, indicating *** urinary symptoms. His SHIM was ***, indicating he {does not have/has mild/moderate/severe} erectile dysfunction. A complete review of systems is obtained and is otherwise negative.   PHYSICAL EXAM:  Wt Readings from Last 3 Encounters:  04/28/23 170 lb 12.8 oz (77.5 kg)  12/01/22 172 lb (78 kg)  11/13/22 169 lb 12.8 oz (77 kg)   Temp Readings from Last 3 Encounters:  04/28/23 (!) 97.3 F (36.3 C)  12/01/22 98 F (36.7 C) (Oral)  02/26/21 98 F (36.7 C) (Oral)   BP Readings from Last 3 Encounters:  04/28/23 (!) 162/75  12/01/22 118/60  11/13/22 110/68   Pulse Readings from Last 3 Encounters:  04/28/23 67  12/01/22 (!) 58  11/13/22  (!) 59    /10  In general this is a well appearing *** man in no acute distress. He's alert and oriented x4 and appropriate throughout the examination. Cardiopulmonary assessment is negative for acute distress and he exhibits normal effort.    KPS = ***  100 - Normal; no complaints; no evidence of disease. 90   - Able to carry on normal activity; minor signs or symptoms of disease. 80   - Normal activity with effort; some signs or symptoms of disease. 83   - Cares for self; unable to carry on normal activity or to do active work. 60   - Requires occasional assistance, but is able to care for most of his personal needs. 50   - Requires considerable assistance and frequent medical care. 40   - Disabled; requires special care and assistance. 30   - Severely disabled; hospital admission is indicated although death not imminent. 20   - Very sick; hospital admission necessary; active supportive treatment necessary. 10   - Moribund; fatal processes progressing rapidly. 0     - Dead  Karnofsky DA, Abelmann WH, Craver LS and Burchenal [facility] [phone]) The use of the nitrogen mustards in the palliative treatment of carcinoma: with particular reference to bronchogenic carcinoma Cancer 1 634-56   LABORATORY DATA:  Lab Results  Component Value Date  WBC 8.1 11/19/2022   HGB 15.9 11/19/2022   HCT 46.2 11/19/2022   MCV 94 11/19/2022   PLT 276 11/19/2022   Lab Results  Component Value Date   NA 136 11/19/2022   K 4.5 11/19/2022   CL 101 11/19/2022   CO2 24 11/19/2022   Lab Results  Component Value Date   ALT 25 11/19/2022   AST 23 11/19/2022   ALKPHOS 82 11/19/2022   BILITOT 0.7 11/19/2022     RADIOGRAPHY: NM PET (PSMA) SKULL TO MID THIGH Result Date: 04/06/2023 CLINICAL DATA:  Elevated PSA equal 4.63. EXAM: NUCLEAR MEDICINE PET SKULL BASE TO THIGH TECHNIQUE: 8.6 mCi Flotufolastat (Posluma) was injected intravenously. Full-ring PET imaging was performed from the skull base to thigh after  the radiotracer. CT data was obtained and used for attenuation correction and anatomic localization. COMPARISON:  Prostate MRI 12/23/2022 FINDINGS: NECK No radiotracer activity in neck lymph nodes. Incidental CT finding: None. CHEST No radiotracer accumulation within mediastinal or hilar lymph nodes. No suspicious pulmonary nodules on the CT scan. Incidental CT finding: Centrilobular seen in the upper lobes. Bullous change in the lower RIGHT upper lobe. Mucoid material within the distal trachea. ABDOMEN/PELVIS Prostate: Focal activity inferior aspect of the LEFT lobe of the prostate gland SUV max equal 11.0 on image 198. Activity more centrally in the gland relates to TURP defect. Lymph nodes: No abnormal radiotracer accumulation within pelvic or abdominal nodes. Liver: No evidence of liver metastasis. Incidental CT finding: None. SKELETON No focal activity to suggest skeletal metastasis. IMPRESSION: 1. Focal activity in the LEFT lobe of the prostate gland consistent with primary prostate adenocarcinoma. 2. No evidence of metastatic adenopathy in the pelvis or periaortic retroperitoneum. 3. No evidence of visceral metastasis or skeletal metastasis. 4. Emphysema in the lungs. Electronically Signed   By: Genevive Bi M.D.   On: 04/06/2023 17:24      IMPRESSION/PLAN: 73 y.o. gentleman with Stage T1c adenocarcinoma of the prostate with a Gleason score of 3+4 and a PSA of 4.63.    We discussed the patient's workup and outlined the nature of prostate cancer in this setting. The patient's T stage, Gleason's score, and PSA put him into the favorable intermediate risk group. Accordingly, he is eligible for a variety of potential treatment options including brachytherapy, 5.5 weeks of external radiation, or prostatectomy. We discussed the available radiation techniques, and focused on the details and logistics of delivery. We discussed and outlined the risks, benefits, short and long-term effects associated with  radiotherapy and compared and contrasted these with prostatectomy. We discussed the role of SpaceOAR gel in reducing the rectal toxicity associated with radiotherapy. He appears to have a good understanding of his disease and our treatment recommendations which are of curative intent.  He was encouraged to ask questions that were answered to his/their stated satisfaction.  At the end of the conversation the patient is interested in moving forward with ***.   We personally spent *** minutes in this encounter including chart review, reviewing radiological studies, meeting face-to-face with the patient, entering orders and completing documentation.    Marguarite Arbour, PA-C    Margaretmary Dys, MD  [facility]  Radiation Oncology Direct Dial: [phone]  Fax: [phone] [email or web address]  Skype  [email or web address]   This document serves as a record of services personally performed by Margaretmary Dys, MD and Marcello Fennel, PA-C. It was created on their behalf by Mickie Bail, a trained medical scribe. The creation of this record is based on the  scribe's personal observations and the provider's statements to them. This document has been checked and approved by the attending provider.

## 2023-04-28 NOTE — Progress Notes (Incomplete)
[name]

## 2023-05-05 NOTE — Progress Notes (Signed)
Awaiting prostate biopsy to be scheduled at [facility] with Dr. Annabell Howells.  RN left message with patient to update and encouraged him to call with any questions or concerns.   Will continue to follow to ensure biopsy date.

## 2023-05-06 ENCOUNTER — Telehealth: Payer: Self-pay | Admitting: *Deleted

## 2023-05-06 NOTE — Telephone Encounter (Signed)
   Pre-operative Risk Assessment    Patient Name: [name]  DOB: 01-15-1951 MRN: [id number]   Date of last office visit: 11/13/22 Jari Favre, [facility] Date of next office visit: 05/19/23 Jari Favre, [facility]   Request for Surgical Clearance    Procedure:   PROSTATE Bx   Date of Surgery:  Clearance TBD                                Surgeon:  DR. Bjorn Pippin Surgeon's Group or Practice Name:  [facility] Phone number:  [phone] Fax number:  [phone]   Type of Clearance Requested:   - Medical  - Pharmacy:  Hold Rivaroxaban (Xarelto) x 5 DAYS PRIOR   Type of Anesthesia:  Not Indicated   Additional requests/questions:    Elpidio Anis   05/06/2023, 8:19 AM

## 2023-05-07 NOTE — Telephone Encounter (Signed)
Patient with diagnosis of atrial fibrillation on Xarelto for anticoagulation.    Procedure:   PROSTATE Bx  Date of Surgery:  Clearance TBD    CHA2DS2-VASc Score = 6   This indicates a 9.7% annual risk of stroke. The patient's score is based upon: CHF History: 1 HTN History: 1 Diabetes History: 0 Stroke History: 2 Vascular Disease History: 1 Age Score: 1 Gender Score: 0   Chart notes possible CVA vs migraine in 2006  CrCl 57 Platelet count 276  Per office protocol, patient can hold Xarelto for 3 days prior to procedure.   Patient will not need bridging with Lovenox (enoxaparin) around procedure.  Note our protocol has maximum 3 day hold for procedures, if urology wants more than 3 day hold will need to defer to cardiologist for final decision.    **This guidance is not considered finalized until pre-operative APP has relayed final recommendations.**

## 2023-05-07 NOTE — Telephone Encounter (Signed)
Pt has appt in office with Jari Favre, [facility] 05/19/23.

## 2023-05-07 NOTE — Telephone Encounter (Signed)
   Name: [name]  DOB: 01/09/1951  MRN: [id number]  Primary Cardiologist: Donato Schultz, MD   Preoperative team, please contact this patient and set up a phone call appointment for further preoperative risk assessment. Please obtain consent and complete medication review. Thank you for your help. Last seen by Jari Favre on 11/13/2022  I confirm that guidance regarding antiplatelet and oral anticoagulation therapy has been completed and, if necessary, noted below.  Per office protocol, patient can hold Xarelto for 3 days prior to procedure.   Patient will not need bridging with Lovenox (enoxaparin) around procedure.  I also confirmed the patient resides in the state of [place]. As per [facility] [employer] telemedicine laws, the patient must reside in the state in which the provider is licensed.   Joni Reining, NP 05/07/2023, 12:05 PM [facility]

## 2023-05-15 NOTE — Progress Notes (Signed)
 RN left message with patient for call back to follow up after recent PMDC.   Pending prostate biopsy date at this time, urology is working to obtain clearance from cardiology. Patient is scheduled to see cardiology on 3/4.

## 2023-05-18 NOTE — Progress Notes (Unsigned)
 Office Visit    Patient Name: [name] Date of Encounter: 05/19/2023  PCP:  Myrlene Broker, MD   [facility]  Cardiologist:  Donato Schultz, MD  Advanced Practice Provider:  No care team member to display Electrophysiologist:  None   HPI    [name] is a 73 y.o. male with past medical history significant for paroxysmal atrial fibrillation, chronic systolic heart failure with resolution of EF, alcohol use, CAD (catheterization 2012 showed chronic total occlusion of the distal RCA with collaterals from right to left, medical therapy recommended), carotid artery disease, COPD, hypertension, hyperlipidemia, palpitations, stroke, tobacco abuse presents today for 73-month follow-up appointment.  He was seen by Dr. Anne Fu 04/2021 and overall has been doing well.  He does have history of atrial fibrillation with ablation in 2012 and DC cardioversion in 2017.  His history also includes chronic systolic heart failure with EF back in 2021 of 66 5% after initial echo showing EF 25%.  He was having some minimal dizziness with standing at his last appointment.  Blood pressure had been low.  No bleeding issues with Xarelto.  Still smoking.  I saw him last 11/11/2021, he feels pretty good.  He is still smoking.  He still is having some dizziness when he stands up too quickly but it subsides quickly.  He does have a blood pressure cuff and we have asked him to take his blood pressure daily for 2 weeks.  He tells me that his shortness of breath has gotten a little bit worse but he does have issues with his lungs as well.  He has a history of carotid artery disease and does not have an updated ultrasound.  We discussed this as well.  Dr. Anne Fu saw him February 2024 and at that time was doing well.  Minimal dizziness with standing.  Blood pressure had been on the low side.  Still smoking.  No issues with Xarelto.  I saw him 10/2022, he tells me that he quit smoking at the first  of the year.  He had smoked for 50 years.  He then unfortunately got COVID and had some more shortness of breath.  Now subsiding.  He does also have some COPD going on.  We reviewed his visit from February.  Blood pressure looked better today.  Less lightheadedness when changing positions.  He is getting lab work done next week here and we have added on A1c and a lipid panel.  Reports no shortness of breath nor dyspnea on exertion. Reports no chest pain, pressure, or tightness. No edema, orthopnea, PND. Reports no palpitations.   Today, he presents with a history of cardiovascular disease,  for a routine follow-up. He reports stable shortness of breath with exertion, such as climbing stairs or hills. He denies chest pain, palpitations, or changes in heart rhythm. He also reports occasional lightheadedness upon standing, which he attributes to possible dehydration. He admits to not drinking enough water. He has Raynaud's disease, which causes his hands to be cold and discolored, but he denies any associated pain. 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  Reports no shortness of breath nor dyspnea on exertion. Reports no chest pain, pressure, or tightness. No edema, orthopnea, PND. Reports no palpitations.   Discussed the use of AI scribe software for clinical note transcription with the patient, who gave verbal consent to proceed.  Per office protocol, patient can hold Xarelto  for 3 days prior to procedure.   Patient will not need bridging with Lovenox (enoxaparin) around procedure.  Past Medical History    Past Medical History:  Diagnosis Date   Adenomatous colon polyp    Alcohol use    Antiphospholipid antibody positive    ???In the past???but not proven according to patient   Antiphospholipid antibody positive    ??? in the past, but not proven according to the patient.   Atrial flutter (HCC)    atrial flutter ablation,   Dr.Allred October 18, 2010   BPH (benign prostatic hypertrophy)    CAD (coronary artery disease)    Catheterization 2005 disease / catheterization July 06, 2010, chronic total occlusion distal RCA with collaterals from right and left side.  Medical therapy recommended, mild decreased LV function        Carotid artery disease (HCC)    0-39% R. ICA, 40-59% LICA... Doppler.... January, 2010  /  Doppler... in January, 2011 no change  /  doppler1/27/2012...stable  0-39% RICA 40-59% LICA   CHF (congestive heart failure) (HCC)    COPD (chronic obstructive pulmonary disease) (HCC)    CRAO (central retinal artery occlusion)    Ejection fraction     EF 50-55%, echo, March, 2012... inferobasal and distal septal hypokinesis /      60%, echo, November, 2008   Emphysema of lung [facility])    GERD (gastroesophageal reflux disease)    Hypercholesterolemia    Hypertension    Myocardial infarction (HCC)    Nonsustained ventricular tachycardia (HCC)    Palpitations    probable SVT, rate 170,, at home,, lasted one hour,, 2012   Prostate cancer (HCC)    Raynaud's syndrome    Retinal artery occlusion    Stroke (HCC)    mild stroke per pt    TIA (transient ischemic attack)    Possible small vessel tia in the past.   Tobacco abuse    Past Surgical History:  Procedure Laterality Date   CARDIAC CATHETERIZATION  06/27/2003   2005.... mild irregularity of the LAD..( patient had had abnormal Myoview scan)   CARDIOVERSION N/A 03/06/2016   Procedure: CARDIOVERSION;  Surgeon: Wendall Stade, MD;  Location: [facility] ENDOSCOPY;  Service: Cardiovascular;  Laterality: N/A;   COLONOSCOPY  08/10/2020   INGUINAL HERNIA REPAIR  03/18/2007   right with mesh.  Dr Carolynne Edouard   KNEE SURGERY Right 02/2022   MELANOMA EXCISION     Will be scheduled for surgical removal on 05/25/2023 (Right Ear)   TEE WITHOUT CARDIOVERSION N/A 03/06/2016   Procedure: TRANSESOPHAGEAL ECHOCARDIOGRAM (TEE);  Surgeon: Wendall Stade, MD;  Location: [facility] ENDOSCOPY;  Service:  Cardiovascular;  Laterality: N/A;   THYROGLOSSAL DUCT CYST  03/17/2004   Dr. Annalee Genta   TRANSURETHRAL RESECTION OF PROSTATE N/A 05/15/2020   Procedure: TRANSURETHRAL RESECTION OF THE PROSTATE (TURP);  Surgeon: Bjorn Pippin, MD;  Location: [facility] ORS;  Service: Urology;  Laterality: N/A;    Allergies  Allergies  Allergen Reactions   Cialis [Tadalafil] Other (See Comments)    History of stroke    Levitra [Vardenafil] Other (See Comments)    History of stroke    Viagra [Sildenafil Citrate] Other (See Comments)    History of stroke      EKGs/Labs/Other Studies Reviewed:   The following studies were reviewed today:  Echocardiogram 10/19/2019  IMPRESSIONS     1. Left ventricular ejection fraction, by estimation, is 60 to 65%. The  left ventricle has normal function. The left  ventricle has no regional  wall motion abnormalities. Left ventricular diastolic parameters were  normal.   2. Right ventricular systolic function is normal. The right ventricular  size is normal.   3. The mitral valve is grossly normal. Trivial mitral valve  regurgitation.   4. The aortic valve is normal in structure. Aortic valve regurgitation is  not visualized. No aortic stenosis is present.   Carotid ultrasound 04/05/2020 Summary:  Right Carotid: Velocities in the right ICA are consistent with a 1-39%  stenosis.                Non-hemodynamically significant plaque <50% noted in the  CCA. The                 ECA appears >50% stenosed.   Left Carotid: Velocities in the left ICA are consistent with a 1-39%  stenosis.   Vertebrals: Bilateral vertebral arteries demonstrate antegrade flow.  Subclavians: Normal flow hemodynamics were seen in bilateral subclavian               arteries.   EKG:  EKG is  ordered today.  The ekg ordered today demonstrates normal sinus rhythm, rate 67 bpm  Recent Labs: 11/19/2022: ALT 25; BUN 18; Creatinine, Ser 1.30; Hemoglobin 15.9; Platelets 276; Potassium 4.5; Sodium 136;  TSH 4.270  Recent Lipid Panel    Component Value Date/Time   CHOL 146 11/19/2022 0905   TRIG 89 11/19/2022 0905   HDL 64 11/19/2022 0905   CHOLHDL 2.3 11/19/2022 0905   CHOLHDL 2 07/27/2017 0906   VLDL 12.2 07/27/2017 0906   LDLCALC 65 11/19/2022 0905     Home Medications   Current Meds  Medication Sig   albuterol (VENTOLIN HFA) 108 (90 Base) MCG/ACT inhaler INHALE TWO PUFFS BY MOUTH INTO LUNGS every FOUR hours AS NEEDED FOR WHEEZING AND/OR SHORTNESS OF BREATH   b complex vitamins capsule Take 1 capsule by mouth daily.   Fluticasone-Umeclidin-Vilant (TRELEGY ELLIPTA) 100-62.5-25 MCG/ACT AEPB Inhale 1 puff into the lungs daily.   FLUZONE HIGH-DOSE 0.5 ML injection    Multiple Vitamin (MULTIVITAMIN WITH MINERALS) TABS tablet Take 1 tablet by mouth daily.   [DISCONTINUED] amiodarone (PACERONE) 200 MG tablet TAKE 1/2 TABLET BY MOUTH ONCE DAILY   [DISCONTINUED] atorvastatin (LIPITOR) 40 MG tablet Take one tablet by mouth once daily   [DISCONTINUED] lisinopril (ZESTRIL) 5 MG tablet Take 1 tablet (5 mg total) by mouth daily.   [DISCONTINUED] metoprolol succinate (TOPROL-XL) 25 MG 24 hr tablet Take 1 tablet (25 mg total) by mouth daily.   [DISCONTINUED] rivaroxaban (XARELTO) 20 MG TABS tablet Take 1 tablet (20 mg total) by mouth daily.     Review of Systems      All other systems reviewed and are otherwise negative except as noted above.  Physical Exam    VS:  BP 128/82   Pulse 69   Ht 6\' 2"  (1.88 m)   Wt 170 lb 9.6 oz (77.4 kg)   BMI 21.90 kg/m  , BMI Body mass index is 21.9 kg/m.  Wt Readings from Last 3 Encounters:  05/19/23 170 lb 9.6 oz (77.4 kg)  04/28/23 170 lb 12.8 oz (77.5 kg)  12/01/22 172 lb (78 kg)     GEN: Well nourished, well developed, in no acute distress. HEENT: normal. Neck: Supple, no JVD, carotid bruits, or masses. Cardiac: RRR, no murmurs, rubs, or gallops. No clubbing, cyanosis, edema.  Radials/PT 2+ and equal bilaterally.  Respiratory:   Respirations regular and unlabored, clear  to auscultation bilaterally. GI: Soft, nontender, nondistended. MS: No deformity or atrophy. Skin: Warm and dry, no rash. Neuro:  Strength and sensation are intact. Psych: Normal affect.  Assessment & Plan    Preop clearance   Mr. Mccarey perioperative risk of a major cardiac event is 11% according to the Revised Cardiac Risk Index (RCRI).  Therefore, he is at high risk for perioperative complications.   His functional capacity is good at 5.07 METs according to the Duke Activity Status Index (DASI). Recommendations: According to ACC/AHA guidelines, no further cardiovascular testing needed.  The patient may proceed to surgery at acceptable risk.   Antiplatelet and/or Anticoagulation Recommendations:  Xarelto (Rivaroxaban) can be held for 3 days prior to surgery.  Please resume post op when felt to be safe.     CAD Stable symptoms of exertional dyspnea. No chest pain or palpitations. EKG today was normal sinus rhythm. Last echocardiogram in 2023 showed normal systolic function and no significant valvular disease. -Continue current medications. -If symptoms progress, notify the office for possible repeat echocardiogram.  Orthostatic Hypotension Reports lightheadedness upon standing, likely due to dehydration. -Increase daily water intake to 64 ounces. -Consider wearing compression stockings.  Carotid Artery Disease Last carotid ultrasound in September 2024. -Plan for repeat carotid ultrasound in September 2025.  Raynaud's Disease Reports cold hands and discoloration, no pain. -Continue current management strategies.  General Health Maintenance -Refill current prescriptions. -Return for follow-up in six months (September 2025). -Complete paperwork for handicap parking permit.  Centrilobular emphysema -Chronic SOB -he is due for a scan in the fall   Paroxysmal atrial fibrillation -no issues with palpitations -he is in NSR  today -continue Xarelto for anticoagulation        Disposition: Follow up 6 months with Donato Schultz, MD or APP.  Signed, Sharlene Dory, PA-C 05/19/2023, 10:00 AM [facility]

## 2023-05-19 ENCOUNTER — Encounter: Payer: Self-pay | Admitting: Physician Assistant

## 2023-05-19 ENCOUNTER — Ambulatory Visit: Payer: HMO | Attending: Physician Assistant | Admitting: Physician Assistant

## 2023-05-19 VITALS — BP 128/82 | HR 69 | Ht 74.0 in | Wt 170.6 lb

## 2023-05-19 DIAGNOSIS — J432 Centrilobular emphysema: Secondary | ICD-10-CM | POA: Diagnosis not present

## 2023-05-19 DIAGNOSIS — I4891 Unspecified atrial fibrillation: Secondary | ICD-10-CM | POA: Diagnosis not present

## 2023-05-19 DIAGNOSIS — I700 Atherosclerosis of aorta: Secondary | ICD-10-CM

## 2023-05-19 DIAGNOSIS — I6523 Occlusion and stenosis of bilateral carotid arteries: Secondary | ICD-10-CM | POA: Diagnosis not present

## 2023-05-19 DIAGNOSIS — I951 Orthostatic hypotension: Secondary | ICD-10-CM

## 2023-05-19 DIAGNOSIS — Z79899 Other long term (current) drug therapy: Secondary | ICD-10-CM

## 2023-05-19 DIAGNOSIS — I10 Essential (primary) hypertension: Secondary | ICD-10-CM | POA: Diagnosis not present

## 2023-05-19 DIAGNOSIS — I5022 Chronic systolic (congestive) heart failure: Secondary | ICD-10-CM

## 2023-05-19 DIAGNOSIS — I2510 Atherosclerotic heart disease of native coronary artery without angina pectoris: Secondary | ICD-10-CM | POA: Diagnosis not present

## 2023-05-19 DIAGNOSIS — I480 Paroxysmal atrial fibrillation: Secondary | ICD-10-CM | POA: Diagnosis not present

## 2023-05-19 MED ORDER — RIVAROXABAN 20 MG PO TABS: 20.0000 mg | ORAL_TABLET | Freq: Every day | ORAL | 1 refills | Status: DC

## 2023-05-19 MED ORDER — LISINOPRIL 5 MG PO TABS: 5.0000 mg | ORAL_TABLET | Freq: Every day | ORAL | 3 refills | Status: AC

## 2023-05-19 MED ORDER — ATORVASTATIN CALCIUM 40 MG PO TABS: ORAL_TABLET | ORAL | 3 refills | Status: AC

## 2023-05-19 MED ORDER — AMIODARONE HCL 200 MG PO TABS: ORAL_TABLET | ORAL | 3 refills | Status: AC

## 2023-05-19 MED ORDER — METOPROLOL SUCCINATE ER 25 MG PO TB24: 25.0000 mg | ORAL_TABLET | Freq: Every day | ORAL | 3 refills | Status: AC

## 2023-05-19 NOTE — Patient Instructions (Signed)
 Medication Instructions:  Your physician recommends that you continue on your current medications as directed. Please refer to the Current Medication list given to you today.  *If you need a refill on your cardiac medications before your next appointment, please call your pharmacy*   Lab Work: None ordered  If you have labs (blood work) drawn today and your tests are completely normal, you will receive your results only by: MyChart Message (if you have MyChart) OR A paper copy in the mail If you have any lab test that is abnormal or we need to change your treatment, we will call you to review the results.   Testing/Procedures: None ordered   Follow-Up: At [facility], you and your health needs are our priority.  As part of our continuing mission to provide you with exceptional heart care, we have created designated Provider Care Teams.  These Care Teams include your primary Cardiologist (physician) and Advanced Practice Providers (APPs -  Physician Assistants and Nurse Practitioners) who all work together to provide you with the care you need, when you need it.  We recommend signing up for the patient portal called "MyChart".  Sign up information is provided on this After Visit Summary.  MyChart is used to connect with patients for Virtual Visits (Telemedicine).  Patients are able to view lab/test results, encounter notes, upcoming appointments, etc.  Non-urgent messages can be sent to your provider as well.   To learn more about what you can do with MyChart, go to [email or web address].    Your next appointment:   6 month(s)  Provider:   Jari Favre, PA-C         Other Instructions    1st Floor: - Lobby - Registration  - Pharmacy  - Lab - Cafe  2nd Floor: - PV Lab - Diagnostic Testing (echo, CT, nuclear med)  3rd Floor: - Vacant  4th Floor: - TCTS (cardiothoracic surgery) - AFib Clinic - Structural Heart Clinic - Vascular Surgery  - Vascular  Ultrasound  5th Floor: - HeartCare Cardiology (general and EP) - Clinical Pharmacy for coumadin, hypertension, lipid, weight-loss medications, and med management appointments    Valet parking services will be available as well.

## 2023-05-25 DIAGNOSIS — L988 Other specified disorders of the skin and subcutaneous tissue: Secondary | ICD-10-CM | POA: Diagnosis not present

## 2023-05-25 DIAGNOSIS — D0321 Melanoma in situ of right ear and external auricular canal: Secondary | ICD-10-CM | POA: Diagnosis not present

## 2023-05-25 DIAGNOSIS — Z85828 Personal history of other malignant neoplasm of skin: Secondary | ICD-10-CM | POA: Diagnosis not present

## 2023-05-25 DIAGNOSIS — L905 Scar conditions and fibrosis of skin: Secondary | ICD-10-CM | POA: Diagnosis not present

## 2023-05-26 DIAGNOSIS — D0321 Melanoma in situ of right ear and external auricular canal: Secondary | ICD-10-CM | POA: Diagnosis not present

## 2023-05-26 DIAGNOSIS — L988 Other specified disorders of the skin and subcutaneous tissue: Secondary | ICD-10-CM | POA: Diagnosis not present

## 2023-05-27 DIAGNOSIS — C61 Malignant neoplasm of prostate: Secondary | ICD-10-CM | POA: Diagnosis not present

## 2023-05-27 DIAGNOSIS — R972 Elevated prostate specific antigen [PSA]: Secondary | ICD-10-CM | POA: Diagnosis not present

## 2023-05-27 DIAGNOSIS — D075 Carcinoma in situ of prostate: Secondary | ICD-10-CM | POA: Diagnosis not present

## 2023-05-27 DIAGNOSIS — N4289 Other specified disorders of prostate: Secondary | ICD-10-CM | POA: Diagnosis not present

## 2023-06-02 NOTE — Progress Notes (Signed)
 Patient was originally presented at the [facility] on 2/11 for his stage T1c adenocarcinoma of the prostate with a Gleason's score of 3+4 and a PSA of 4.63 with recommendations of having a repeat biopsy.   Patient had repeat biopsy with Dr. Annabell Howells on 3/12 with confirmation of Gleason score 4+3=7.    Request sent to have patient scheduled to review treatment recommendations.  RN spoke with patient to review next steps.

## 2023-06-08 NOTE — Progress Notes (Signed)
 Nursing interview for a diagnosis of Prostatic adenocarcinoma (HCC) Stage IIB (cT1c, cN0, cM0, PSA: 4.6, Grade Group: 2).  Patient identity verified x2.   Patient reports ***. Patient denies all other related issues at this time.  Meaningful use complete.  I-PSS (AUA) score- *** - {(BH) RANGE ABSENT/SEVERE:20013:s} SHIM (ED) score- *** Urinary Management medication(s) *** Urology appointment date- *** with Dr. Annabell Howells at [facility]  Vitals- ***  This concludes the interaction.

## 2023-06-11 ENCOUNTER — Ambulatory Visit
Admission: RE | Admit: 2023-06-11 | Discharge: 2023-06-11 | Disposition: A | Discharge status: Home / Self Care | Source: Ambulatory Visit | Attending: Radiation Oncology | Admitting: Radiation Oncology

## 2023-06-11 ENCOUNTER — Ambulatory Visit
Admission: RE | Admit: 2023-06-11 | Discharge: 2023-06-11 | Disposition: A | Discharge status: Home / Self Care | Source: Ambulatory Visit | Attending: Urology | Admitting: Urology

## 2023-06-11 VITALS — BP 139/59 | HR 60 | Temp 97.1°F | Resp 18 | Ht 74.0 in | Wt 171.4 lb

## 2023-06-11 DIAGNOSIS — I2510 Atherosclerotic heart disease of native coronary artery without angina pectoris: Secondary | ICD-10-CM | POA: Insufficient documentation

## 2023-06-11 DIAGNOSIS — I110 Hypertensive heart disease with heart failure: Secondary | ICD-10-CM | POA: Diagnosis not present

## 2023-06-11 DIAGNOSIS — Z8546 Personal history of malignant neoplasm of prostate: Secondary | ICD-10-CM | POA: Diagnosis not present

## 2023-06-11 DIAGNOSIS — Z87891 Personal history of nicotine dependence: Secondary | ICD-10-CM | POA: Insufficient documentation

## 2023-06-11 DIAGNOSIS — I252 Old myocardial infarction: Secondary | ICD-10-CM | POA: Insufficient documentation

## 2023-06-11 DIAGNOSIS — E7800 Pure hypercholesterolemia, unspecified: Secondary | ICD-10-CM | POA: Diagnosis not present

## 2023-06-11 DIAGNOSIS — Z79899 Other long term (current) drug therapy: Secondary | ICD-10-CM | POA: Diagnosis not present

## 2023-06-11 DIAGNOSIS — N400 Enlarged prostate without lower urinary tract symptoms: Secondary | ICD-10-CM | POA: Insufficient documentation

## 2023-06-11 DIAGNOSIS — I7300 Raynaud's syndrome without gangrene: Secondary | ICD-10-CM | POA: Diagnosis not present

## 2023-06-11 DIAGNOSIS — Z7901 Long term (current) use of anticoagulants: Secondary | ICD-10-CM | POA: Diagnosis not present

## 2023-06-11 DIAGNOSIS — Z860101 Personal history of adenomatous and serrated colon polyps: Secondary | ICD-10-CM | POA: Insufficient documentation

## 2023-06-11 DIAGNOSIS — C61 Malignant neoplasm of prostate: Secondary | ICD-10-CM

## 2023-06-11 DIAGNOSIS — Z8673 Personal history of transient ischemic attack (TIA), and cerebral infarction without residual deficits: Secondary | ICD-10-CM | POA: Diagnosis not present

## 2023-06-11 DIAGNOSIS — I509 Heart failure, unspecified: Secondary | ICD-10-CM | POA: Diagnosis not present

## 2023-06-11 DIAGNOSIS — J439 Emphysema, unspecified: Secondary | ICD-10-CM | POA: Diagnosis not present

## 2023-06-11 DIAGNOSIS — Z191 Hormone sensitive malignancy status: Secondary | ICD-10-CM | POA: Diagnosis not present

## 2023-06-11 NOTE — Progress Notes (Signed)
 Radiation Oncology         [phone] ________________________________  Multidisciplinary Prostate Cancer Clinic  Initial Radiation Oncology Consultation  Name: [name] MRN: [id number]  Date: 06/11/2023  DOB: [birth year]  LK:[id number], [name], MD  Bjorn Pippin, MD   REFERRING PHYSICIAN: Bjorn Pippin, MD  DIAGNOSIS: 73 y.o. gentleman with stage T1c adenocarcinoma of the prostate with a Gleason's score of 3+4 and a PSA of 4.63    ICD-10-CM   1. Prostatic adenocarcinoma (HCC)  C61       HISTORY OF PRESENT ILLNESS::[name] is a 73 y.o. gentleman. He was initially diagnosed with Gleason 3+3 involving one core with 14% of tissue on biopsy performed by Dr. Annabell Howells in July 2021, with a PSA of 4.39.     He was appropriately placed on active surveillance at that time. He developed BOO in 2022, prompting TURP on 05/15/20. Pathology from the procedure showed a small focus of Gleason 3+4 disease.    His PSA decreased to 1.94 following the procedure but increased back up to 2.56 in June 2023. Surveillance prostate MRIs on 09/10/21 and 12/23/22 were negative for any evidence of high-grade disease so he continued active surveillance.  His PSA has continued to gradually rise at 3.06 in December 2023, 3.92 in July 2024 and most recently, up to 4.63 on 03/16/2023. Given patient's comorbidities and reluctance to proceed with repeat biopsy, he underwent PSMA PET scan on 03/31/23 showing only focal activity in the left lobe of prostate without evidence of disease outside of the gland.     He was initially referred to the multidisciplinary prostate cancer clinic on 04/28/23 for presentation of pathology and radiology studies in our conference for discussion of potential radiation treatment options and clinical evaluation. The consensus recommendation at that time was to proceed with prostate biopsy to update the pathology and inform treatment recommendations, which he was in agreement with. He  had the repeat biopsy with Dr. Annabell Howells on 05/27/23 and final pathology from the procedure confirmed disease upstaging to Gleason 4+3 disease. Out of 12 core biopsies, 5 were positive. The prostate volume measured 26 gm. Gleason 4+3 was seen in the left apex lateral.  Additionally, Gleason 3+4 was seen in the left mid lateral and Gleason 3+3 in the left apex, right apex and right base.  He is seen back in follow-up to review pathology and discuss formal treatment recommendations.  PREVIOUS RADIATION THERAPY: No  PAST MEDICAL HISTORY:  has a past medical history of Adenomatous colon polyp, Alcohol use, Antiphospholipid antibody positive, Antiphospholipid antibody positive, Atrial flutter (HCC), BPH (benign prostatic hypertrophy), CAD (coronary artery disease), Carotid artery disease (HCC), CHF (congestive heart failure) (HCC), COPD (chronic obstructive pulmonary disease) (HCC), CRAO (central retinal artery occlusion), Ejection fraction, Emphysema of lung (HCC), GERD (gastroesophageal reflux disease), Hypercholesterolemia, Hypertension, Myocardial infarction (HCC), Nonsustained ventricular tachycardia (HCC), Palpitations, Prostate cancer (HCC), Raynaud's syndrome, Retinal artery occlusion, Stroke (HCC), TIA (transient ischemic attack), and Tobacco abuse.    PAST SURGICAL HISTORY: Past Surgical History:  Procedure Laterality Date   CARDIAC CATHETERIZATION  06/27/2003   2005.... mild irregularity of the LAD..( patient had had abnormal Myoview scan)   CARDIOVERSION N/A 03/06/2016   Procedure: CARDIOVERSION;  Surgeon: Wendall Stade, MD;  Location: [facility] ENDOSCOPY;  Service: Cardiovascular;  Laterality: N/A;   COLONOSCOPY  08/10/2020   INGUINAL HERNIA REPAIR  03/18/2007   right with mesh.  Dr Carolynne Edouard   KNEE SURGERY Right 02/2022   MELANOMA EXCISION  Will be scheduled for surgical removal on 05/25/2023 (Right Ear)   TEE WITHOUT CARDIOVERSION N/A 03/06/2016   Procedure: TRANSESOPHAGEAL ECHOCARDIOGRAM (TEE);   Surgeon: Wendall Stade, MD;  Location: [facility] ENDOSCOPY;  Service: Cardiovascular;  Laterality: N/A;   THYROGLOSSAL DUCT CYST  03/17/2004   Dr. Annalee Genta   TRANSURETHRAL RESECTION OF PROSTATE N/A 05/15/2020   Procedure: TRANSURETHRAL RESECTION OF THE PROSTATE (TURP);  Surgeon: Bjorn Pippin, MD;  Location: [facility] ORS;  Service: Urology;  Laterality: N/A;    FAMILY HISTORY: He was adopted. Family history is unknown by patient.  SOCIAL HISTORY:  reports that he quit smoking about 14 months ago. His smoking use included cigarettes. He started smoking about 55 years ago. He has a 27 pack-year smoking history. He has never used smokeless tobacco. He reports current alcohol use of about 14.0 - 21.0 standard drinks of alcohol per week. He reports that he does not use drugs.  ALLERGIES: Cialis [tadalafil], Levitra [vardenafil], and Viagra [sildenafil citrate]  MEDICATIONS:  Current Outpatient Medications  Medication Sig Dispense Refill   albuterol (VENTOLIN HFA) 108 (90 Base) MCG/ACT inhaler INHALE TWO PUFFS BY MOUTH INTO LUNGS every FOUR hours AS NEEDED FOR WHEEZING AND/OR SHORTNESS OF BREATH 8.5 g 2   amiodarone (PACERONE) 200 MG tablet TAKE 1/2 TABLET BY MOUTH ONCE DAILY 45 tablet 3   atorvastatin (LIPITOR) 40 MG tablet Take one tablet by mouth once daily 90 tablet 3   b complex vitamins capsule Take 1 capsule by mouth daily.     Fluticasone-Umeclidin-Vilant (TRELEGY ELLIPTA) 100-62.5-25 MCG/ACT AEPB Inhale 1 puff into the lungs daily. 1 each 11   FLUZONE HIGH-DOSE 0.5 ML injection  (Patient not taking: Reported on 06/11/2023)     lisinopril (ZESTRIL) 5 MG tablet Take 1 tablet (5 mg total) by mouth daily. 90 tablet 3   metoprolol succinate (TOPROL-XL) 25 MG 24 hr tablet Take 1 tablet (25 mg total) by mouth daily. 90 tablet 3   Multiple Vitamin (MULTIVITAMIN WITH MINERALS) TABS tablet Take 1 tablet by mouth daily. 30 tablet 0   rivaroxaban (XARELTO) 20 MG TABS tablet Take 1 tablet (20 mg total) by mouth  daily. 90 tablet 1   No current facility-administered medications for this encounter.    REVIEW OF SYSTEMS:  On review of systems, the patient reports that he is doing well overall. He denies any chest pain, shortness of breath, cough, fevers, chills, night sweats, unintended weight changes. He denies any bowel disturbances, and denies abdominal pain, nausea or vomiting. He denies any new musculoskeletal or joint aches or pains. His IPSS was 15, indicating moderate urinary symptoms despite prior TURP. His SHIM was 9, indicating he has moderate-severe erectile dysfunction. A complete review of systems is obtained and is otherwise negative.   PHYSICAL EXAM:  Wt Readings from Last 3 Encounters:  05/19/23 170 lb 9.6 oz (77.4 kg)  04/28/23 170 lb 12.8 oz (77.5 kg)  12/01/22 172 lb (78 kg)   Temp Readings from Last 3 Encounters:  04/28/23 (!) 97.3 F (36.3 C)  12/01/22 98 F (36.7 C) (Oral)  02/26/21 98 F (36.7 C) (Oral)   BP Readings from Last 3 Encounters:  05/19/23 128/82  04/28/23 (!) 162/75  12/01/22 118/60   Pulse Readings from Last 3 Encounters:  05/19/23 69  04/28/23 67  12/01/22 (!) 58    /10  In general this is a well appearing Caucasian man in no acute distress. He's alert and oriented x4 and appropriate throughout the examination. Cardiopulmonary assessment  is negative for acute distress and he exhibits normal effort.    KPS = 100  100 - Normal; no complaints; no evidence of disease. 90   - Able to carry on normal activity; minor signs or symptoms of disease. 80   - Normal activity with effort; some signs or symptoms of disease. 32   - Cares for self; unable to carry on normal activity or to do active work. 60   - Requires occasional assistance, but is able to care for most of his personal needs. 50   - Requires considerable assistance and frequent medical care. 40   - Disabled; requires special care and assistance. 30   - Severely disabled; hospital admission is  indicated although death not imminent. 20   - Very sick; hospital admission necessary; active supportive treatment necessary. 10   - Moribund; fatal processes progressing rapidly. 0     - Dead  Karnofsky DA, Abelmann WH, Craver LS and Burchenal [facility] [phone]) The use of the nitrogen mustards in the palliative treatment of carcinoma: with particular reference to bronchogenic carcinoma Cancer 1 634-56   LABORATORY DATA:  Lab Results  Component Value Date   WBC 8.1 11/19/2022   HGB 15.9 11/19/2022   HCT 46.2 11/19/2022   MCV 94 11/19/2022   PLT 276 11/19/2022   Lab Results  Component Value Date   NA 136 11/19/2022   K 4.5 11/19/2022   CL 101 11/19/2022   CO2 24 11/19/2022   Lab Results  Component Value Date   ALT 25 11/19/2022   AST 23 11/19/2022   ALKPHOS 82 11/19/2022   BILITOT 0.7 11/19/2022     RADIOGRAPHY: No results found.     IMPRESSION/PLAN: 73 y.o. gentleman with Stage T1c adenocarcinoma of the prostate with a Gleason score of 4+3 and a PSA of 4.63.    I discussed the patient's workup and outlined the nature of prostate cancer in this setting. The patient's T stage, Gleason's score, and PSA put him into the unfavorable intermediate risk group. Accordingly, he is eligible for a variety of potential treatment options including prostatectomy, brachytherapy or a 5.5 week course of daily external beam radiation +/- ST-ADT. I discussed the available radiation techniques, and focused on the details and logistics of delivery. I discussed and outlined the risks, benefits, short and long-term effects associated with radiotherapy and compared and contrasted these with prostatectomy. We discussed the role of SpaceOAR gel in reducing the rectal toxicity associated with radiotherapy. I also detailed the role of ADT in the treatment of unfavorable intermediate risk prostate cancer and outlined the associated side effects that could be expected with this therapy. In light of his multiple  medical co-morbidities and increased cardiovascular risks associated with ADT, we feel that the risks outweigh any potential small benefit of ADT and therefore recommend we reserve that for use in the future if he has disease progression despite radiation alone.  He was encouraged to ask questions that were answered to his stated satisfaction.  He has a young granddaughter that he spends time with regularly and therefore does not feel that the brachytherapy procedure would be the best treatment fit for him given the concerns for radiation safety around children.  At the end of the conversation the patient is interested in moving forward with 5.5 weeks of external beam therapy without ADT so I will share our discussion with Dr. Annabell Howells and make arrangements for fiducial markers and SpaceOAR gel placement, first available, prior to simulation, to  reduce rectal toxicity from radiotherapy. The patient appears to have a good understanding of his disease and our treatment recommendations which are of curative intent and is in agreement with the stated plan.  Therefore, we will move forward with treatment planning accordingly, in anticipation of beginning IMRT in the near future. I enjoyed meeting with him and his wife, Waynetta Sandy, again today and look forward to continuing to participate in his care.  I personally spent 45 minutes in this encounter including chart review, reviewing radiological studies, meeting face-to-face with the patient, entering orders, coordinating care and completing documentation.    Marguarite Arbour, MMS, PA-C [facility] Radiation Oncology Physician Assistant Direct Dial: [phone]  Fax: [phone]

## 2023-06-16 ENCOUNTER — Telehealth: Payer: Self-pay | Admitting: Cardiology

## 2023-06-16 ENCOUNTER — Other Ambulatory Visit: Payer: Self-pay | Admitting: Urology

## 2023-06-16 NOTE — Telephone Encounter (Signed)
   [facility] Pre-operative Risk Assessment    Request for surgical clearance:  What type of surgery is being performed?  Fidicial Markers & SpaceOAR   When is this surgery scheduled?  08/28/23   What type of clearance is required (medical clearance vs. Pharmacy clearance to hold med vs. Both)?  Both  Are there any medications that need to be held prior to surgery and how long? Xarelto, 3 days prior   Practice name and name of physician performing surgery?  [facility]  Dr. Annabell Howells   What is your office phone number? [phone] (ext#: [phone])   7.   What is your office fax number? [phone]  8.   Anesthesia type (None, local, MAC, general) ?  MAC   Rolly Pancake 06/16/2023, 10:15 AM

## 2023-06-18 NOTE — Telephone Encounter (Signed)
 Patient with diagnosis of atrial fibrillation on Xarelto for anticoagulation.     Procedure:   PROSTATE Bx  Date of Surgery:  Clearance TBD      CHA2DS2-VASc Score = 6   This indicates a 9.7% annual risk of stroke. The patient's score is based upon: CHF History: 1 HTN History: 1 Diabetes History: 0 Stroke History: 2 Vascular Disease History: 1 Age Score: 1 Gender Score: 0   Chart notes possible CVA vs migraine in 2006   CrCl 57 Platelet count 276   Per office protocol, patient can hold Xarelto for 3 days prior to procedure.   Patient will not need bridging with Lovenox (enoxaparin) around procedure.

## 2023-06-18 NOTE — Telephone Encounter (Signed)
 [name],  You saw this patient on 05/19/2023. At that time you did a risk evaluation for upcoming prostate biopsy. He is now pending fidicial markers and space oar. Do you feel comfortable commenting on medical clearance for this procedure?   Please route your response to P CV DIV Preop. I will communicate with requesting office once you have given recommendations.   Thank you!  Carlos Levering, NP

## 2023-06-23 DIAGNOSIS — Z85820 Personal history of malignant melanoma of skin: Secondary | ICD-10-CM | POA: Diagnosis not present

## 2023-06-23 DIAGNOSIS — L570 Actinic keratosis: Secondary | ICD-10-CM | POA: Diagnosis not present

## 2023-06-23 DIAGNOSIS — Z85828 Personal history of other malignant neoplasm of skin: Secondary | ICD-10-CM | POA: Diagnosis not present

## 2023-06-23 DIAGNOSIS — L821 Other seborrheic keratosis: Secondary | ICD-10-CM | POA: Diagnosis not present

## 2023-06-23 DIAGNOSIS — L812 Freckles: Secondary | ICD-10-CM | POA: Diagnosis not present

## 2023-06-24 NOTE — Telephone Encounter (Signed)
     Primary Cardiologist: Donato Schultz, MD   [name] perioperative risk of a major cardiac event is 11% according to the Revised Cardiac Risk Index (RCRI).  Therefore, he is at high risk for perioperative complications.   His functional capacity is good at 5.07 METs according to the Duke Activity Status Index (DASI). Recommendations: According to ACC/AHA guidelines, no further cardiovascular testing needed.  The patient may proceed to surgery at acceptable risk.   Antiplatelet and/or Anticoagulation Recommendations:   Xarelto (Rivaroxaban) can be held for 3 days prior to surgery.  Please resume post op when felt to be safe.   I will route this recommendation to the requesting party via Epic fax function and remove from pre-op pool.  Please call with questions.  Thomasene Ripple. Deysha Cartier NP-C     06/24/2023, 9:15 AM [facility] [address] Office [phone] Fax [phone]

## 2023-06-26 NOTE — Progress Notes (Signed)
 RN left message for call back to review next steps with fiducial marker's, spaceOAR, and CT Simulation.

## 2023-06-30 ENCOUNTER — Ambulatory Visit (HOSPITAL_COMMUNITY)
Admission: RE | Admit: 2023-06-30 | Discharge: 2023-06-30 | Disposition: A | Discharge status: Home / Self Care | Source: Ambulatory Visit | Attending: Acute Care | Admitting: Acute Care

## 2023-06-30 ENCOUNTER — Encounter (HOSPITAL_COMMUNITY): Payer: Self-pay

## 2023-06-30 DIAGNOSIS — Z122 Encounter for screening for malignant neoplasm of respiratory organs: Secondary | ICD-10-CM | POA: Insufficient documentation

## 2023-06-30 DIAGNOSIS — Z87891 Personal history of nicotine dependence: Secondary | ICD-10-CM | POA: Insufficient documentation

## 2023-06-30 DIAGNOSIS — F17210 Nicotine dependence, cigarettes, uncomplicated: Secondary | ICD-10-CM | POA: Diagnosis not present

## 2023-07-01 ENCOUNTER — Other Ambulatory Visit: Payer: Self-pay | Admitting: Urology

## 2023-07-01 DIAGNOSIS — C61 Malignant neoplasm of prostate: Secondary | ICD-10-CM

## 2023-07-10 NOTE — Progress Notes (Signed)
 RN spoke with patient and reviewed next steps with fiducial marker's, spaceOAR, and CT Simulation.  All questions answered, no additional needs at this time.

## 2023-07-20 ENCOUNTER — Telehealth: Payer: Self-pay

## 2023-07-20 NOTE — Telephone Encounter (Signed)
 Copied from CRM [id number]. Topic: Clinical - Lab/Test Results >> Jul 20, 2023  9:46 AM [name] wrote: Reason for CRM: Patient would like toto know CT results from 06/30/2023.  Sending MyChart messages as results are not back.

## 2023-07-27 ENCOUNTER — Telehealth: Payer: Self-pay | Admitting: *Deleted

## 2023-07-27 NOTE — Telephone Encounter (Signed)
 Call report from [facility]:   IMPRESSION: Lung-RADS 4B, suspicious. Additional imaging evaluation or consultation with Pulmonology or Thoracic Surgery recommended.   New 8.3 mm irregular left upper lobe nodule. Additional new 5.3 mm right upper lobe nodule. Differential considerations include primary bronchogenic carcinoma versus metastatic disease. Consider PET-CT as clinically warranted.   Aortic Atherosclerosis (ICD10-I70.0) and Emphysema (ICD10-J43.9).

## 2023-07-28 NOTE — Telephone Encounter (Signed)
 Copied from CRM [id number]. Topic: General - Other >> Jul 28, 2023 11:03 AM Ambrose Junk wrote: Reason for CRM:  Patient call requesting to talk with Braden Caddy regarding lung scan. Please call.   Called patient and scheduled with sarah 5/28 to go over ct

## 2023-07-30 NOTE — Progress Notes (Signed)
 RN left message for call back to review any questions/barriers.

## 2023-08-03 ENCOUNTER — Ambulatory Visit: Admitting: Acute Care

## 2023-08-03 ENCOUNTER — Encounter: Payer: Self-pay | Admitting: Acute Care

## 2023-08-03 ENCOUNTER — Telehealth: Payer: Self-pay | Admitting: Acute Care

## 2023-08-03 ENCOUNTER — Telehealth: Payer: Self-pay

## 2023-08-03 VITALS — BP 130/60 | HR 60 | Ht 74.0 in | Wt 169.8 lb

## 2023-08-03 DIAGNOSIS — Z87891 Personal history of nicotine dependence: Secondary | ICD-10-CM

## 2023-08-03 DIAGNOSIS — C61 Malignant neoplasm of prostate: Secondary | ICD-10-CM

## 2023-08-03 DIAGNOSIS — J449 Chronic obstructive pulmonary disease, unspecified: Secondary | ICD-10-CM | POA: Diagnosis not present

## 2023-08-03 DIAGNOSIS — C4321 Malignant melanoma of right ear and external auricular canal: Secondary | ICD-10-CM | POA: Diagnosis not present

## 2023-08-03 DIAGNOSIS — R911 Solitary pulmonary nodule: Secondary | ICD-10-CM

## 2023-08-03 MED ORDER — ALBUTEROL SULFATE HFA 108 (90 BASE) MCG/ACT IN AERS: 2.0000 | INHALATION_SPRAY | Freq: Four times a day (QID) | RESPIRATORY_TRACT | 6 refills | Status: AC | PRN

## 2023-08-03 NOTE — Telephone Encounter (Signed)
 Sarah,  Does he need an in office f/u appt to review PET results?   Margretta Shi

## 2023-08-03 NOTE — H&P (View-Only) (Signed)
 History of Present Illness [name] is a 73 y.o. male former  smoker , quit 2024, followed through the Lung Cancer Screening Program.   Pt. Has consented to use of [employer] soft wear to help capture the content of this OV    08/03/2023 Pt. Presents for follow up CT Chest. 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  [name] "[name]" is a 73 year old male who presents for follow-up for  new lung nodules identified on a lung cancer screening CT Chest. He is accompanied by his wife who is a Engineer, civil (consulting).  He is being followed in the lung cancer screening program. A recent CT scan in May 2025 showed new lung nodules, including an 8.3 mm nodule in the left upper lobe and a stable 5.3 mm nodule in the right upper lobe. The previous scan in 2024 showed only one stable nodule. The new 8.3 mm nodule was not present in the previous year's scan.  Pt. was diagnosed with prostate cancer in January 2025, although he has had prostate cancer since 2021. His PSA levels increased from 3.9 to 4.65 over six months, prompting a PET scan that showed only prostate cancer. 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 He was scheduled to start radiation therapy in June 2025 but has been delayed due to pending marker placement. There was no notation of hypermetabolic lung nodules on PET, but it was a prostate specific scan, so comparison is limited. I requested that Radiology do an over-read, comparing the current LDCT to the PET scan done 03/2023, and the previous LDCT done 07/16/2022. I am thankful they were able to do this. Dr. Lyndon Santiago did make the comparison ,as best he could within the limits of the technical difference of the scans. He noted that the irregular left upper lobe nodule on the current study is not well visualized on the prior, but may have been present on image 41, measuring 4-5 mm, progressive since 03/2023.[name] The medial right upper lobe  nodule may have been present on image 40, measuring 3 mm, progressive since January 2025.   I have discussed this with both the patient and his wife, and they are in agreement with an FDG  PET scan to better evaluate the lung nodule. We have this scheduled for tomorrow 5/20. He will follow up with me in the office 08/11/2023, and we will review the results. If the pulmonary nodules are PET avid, we will proceed with bronchoscopy with biopsies.   Pt. States  experiences occasional hemoptysis, occurring once every three to four days, with visible red blood. This has been ongoing since about February 2025. No significant weight loss, noting a weight gain of about ten pounds since quitting smoking in 2024. He has a persistent cough, and his spouse notes audible breathing before he enters a room.  He has a history of COPD and uses Trelegy and albuterol , the latter at least twice daily, although he recently ran out. He has been using Trelegy.     Test Results: LDCT chest 06/30/2023  Lungs/Pleura: New 8.3 mm irregular nodule in the posterior left upper lobe (image 160), suspicious.   New 5.3 mm nodule in the medial right upper lobe (image 160). Stable 5.3 mm nodule in the right middle lobe, benign.   Moderate centrilobular and paraseptal emphysematous changes with bullous changes in the right lower lung.   No focal consolidation.   Mild biapical  pleural-parenchymal scarring.   No pleural effusion or pneumothorax.   Upper Abdomen: Tiny hiatal hernia and vascular calcifications.   Musculoskeletal: Mild degenerative changes of the visualized thoracolumbar spine.   IMPRESSION: Lung-RADS 4B, suspicious. Additional imaging evaluation or consultation with Pulmonology or Thoracic Surgery recommended.   New 8.3 mm irregular left upper lobe nodule. Additional new 5.3 mm right upper lobe nodule. Differential considerations include primary bronchogenic carcinoma versus metastatic disease. Consider  PET-CT as clinically warranted.   PET 03/2023 CHEST   No radiotracer accumulation within mediastinal or hilar lymph nodes. No suspicious pulmonary nodules on the CT scan.   Incidental CT finding: Centrilobular seen in the upper lobes. Bullous change in the lower RIGHT upper lobe. Mucoid material within the distal trachea.  Focal activity in the LEFT lobe of the prostate gland consistent with primary prostate adenocarcinoma. No evidence of metastatic adenopathy in the pelvis or periaortic retroperitoneum. No evidence of visceral metastasis or skeletal metastasis. Emphysema in the lungs.       Latest Ref Rng & Units 11/19/2022    9:05 AM 05/13/2022    9:09 AM 05/08/2021    8:42 AM  CBC  WBC 3.4 - 10.8 x10E3/uL 8.1  10.2  7.9   Hemoglobin 13.0 - 17.7 g/dL 16.1  09.6  04.5   Hematocrit 37.5 - 51.0 % 46.2  44.9  45.9   Platelets 150 - 450 x10E3/uL 276  313  282        Latest Ref Rng & Units 11/19/2022    9:05 AM 05/13/2022    9:09 AM 05/08/2021    8:42 AM  BMP  Glucose 70 - 99 mg/dL 94  409  80   BUN 8 - 27 mg/dL 18  19  17    Creatinine 0.76 - 1.27 mg/dL 8.11  9.14  7.82   BUN/Creat Ratio 10 - 24 14  16  13    Sodium 134 - 144 mmol/L 136  138  138   Potassium 3.5 - 5.2 mmol/L 4.5  4.4  4.8   Chloride 96 - 106 mmol/L 101  101  99   CO2 20 - 29 mmol/L 24  22  24    Calcium  8.6 - 10.2 mg/dL 9.8  9.4  9.8     BNP    Component Value Date/Time   BNP 322.6 (H) 03/04/2016 2336    ProBNP No results found for: "PROBNP"  PFT No results found for: "FEV1PRE", "FEV1POST", "FVCPRE", "FVCPOST", "TLC", "DLCOUNC", "PREFEV1FVCRT", "PSTFEV1FVCRT"  No results found.   Past medical hx Past Medical History:  Diagnosis Date   Adenomatous colon polyp    Alcohol use    Antiphospholipid antibody positive    ???In the past???but not proven according to patient   Antiphospholipid antibody positive    ??? in the past, but not proven according to the patient.   Atrial flutter (HCC)     atrial flutter ablation,   Dr.Allred October 18, 2010   BPH (benign prostatic hypertrophy)    CAD (coronary artery disease)    Catheterization 2005 disease / catheterization July 06, 2010, chronic total occlusion distal RCA with collaterals from right and left side.  Medical therapy recommended, mild decreased LV function        Carotid artery disease (HCC)    0-39% R. ICA, 40-59% LICA... Doppler.... January, 2010  /  Doppler... in January, 2011 no change  /  doppler1/27/2012...stable  0-39% RICA 40-59% LICA   CHF (congestive heart failure) (HCC)    COPD (  chronic obstructive pulmonary disease) (HCC)    CRAO (central retinal artery occlusion)    Ejection fraction     EF 50-55%, echo, March, 2012... inferobasal and distal septal hypokinesis /      60%, echo, November, 2008   Emphysema of lung [facility])    GERD (gastroesophageal reflux disease)    Hypercholesterolemia    Hypertension    Myocardial infarction (HCC)    Nonsustained ventricular tachycardia (HCC)    Palpitations    probable SVT, rate 170,, at home,, lasted one hour,, 2012   Prostate cancer (HCC)    Raynaud's syndrome    Retinal artery occlusion    Stroke (HCC)    mild stroke per pt    TIA (transient ischemic attack)    Possible small vessel tia in the past.   Tobacco abuse      Social History   Tobacco Use   Smoking status: Former    Current packs/day: 0.00    Average packs/day: 0.5 packs/day for 54.0 years (27.0 ttl pk-yrs)    Types: Cigarettes    Start date: 4    Quit date: 03/17/2022    Years since quitting: 1.3   Smokeless tobacco: Never   Tobacco comments:    11/30/19 .5-1pack per day  Vaping Use   Vaping status: Never Used  Substance Use Topics   Alcohol use: Yes    Alcohol/week: 14.0 - 21.0 standard drinks of alcohol    Types: 14 - 21 Cans of beer per week   Drug use: No    Mr.Brinkley reports that he quit smoking about 16 months ago. His smoking use included cigarettes. He started smoking about 55 years  ago. He has a 27 pack-year smoking history. He has never used smokeless tobacco. He reports current alcohol use of about 14.0 - 21.0 standard drinks of alcohol per week. He reports that he does not use drugs.  Tobacco Cessation: Former smoker , Quit 2024 with a 20 + pack year smoking history  Past surgical hx, Family hx, Social hx all reviewed.  Current Outpatient Medications on File Prior to Visit  Medication Sig   albuterol  (VENTOLIN  HFA) 108 (90 Base) MCG/ACT inhaler INHALE TWO PUFFS BY MOUTH INTO LUNGS every FOUR hours AS NEEDED FOR WHEEZING AND/OR SHORTNESS OF BREATH   amiodarone  (PACERONE ) 200 MG tablet TAKE 1/2 TABLET BY MOUTH ONCE DAILY   atorvastatin  (LIPITOR) 40 MG tablet Take one tablet by mouth once daily   b complex vitamins capsule Take 1 capsule by mouth daily.   Fluticasone-Umeclidin-Vilant (TRELEGY ELLIPTA ) 100-62.5-25 MCG/ACT AEPB Inhale 1 puff into the lungs daily.   lisinopril  (ZESTRIL ) 5 MG tablet Take 1 tablet (5 mg total) by mouth daily.   metoprolol  succinate (TOPROL -XL) 25 MG 24 hr tablet Take 1 tablet (25 mg total) by mouth daily.   Multiple Vitamin (MULTIVITAMIN WITH MINERALS) TABS tablet Take 1 tablet by mouth daily.   rivaroxaban  (XARELTO ) 20 MG TABS tablet Take 1 tablet (20 mg total) by mouth daily.   No current facility-administered medications on file prior to visit.     Allergies  Allergen Reactions   Cialis [Tadalafil] Other (See Comments)    History of stroke    Levitra [Vardenafil] Other (See Comments)    History of stroke    Viagra [Sildenafil Citrate] Other (See Comments)    History of stroke     Review Of Systems:  Constitutional:   No  weight loss, night sweats,  Fevers, chills, fatigue, or  lassitude.  HEENT:   No headaches,  Difficulty swallowing,  Tooth/dental problems, or  Sore throat,                No sneezing, itching, ear ache, nasal congestion, post nasal drip,   CV:  No chest pain,  Orthopnea, PND, swelling in lower extremities,  anasarca, dizziness, palpitations, syncope.   GI  No heartburn, indigestion, abdominal pain, nausea, vomiting, diarrhea, change in bowel habits, loss of appetite, bloody stools.   Resp: + baseline  shortness of breath with exertion less at rest.  No excess mucus, no productive cough,  No non-productive cough,  + coughing up of blood.  No change in color of mucus.  No wheezing.  No chest wall deformity  Skin: no rash or lesions.  GU: no dysuria, change in color of urine, no urgency or frequency.  No flank pain, no hematuria   MS:  No joint pain or swelling.  No decreased range of motion.  No back pain.  Psych:  No change in mood or affect. No depression or anxiety.  No memory loss.   Vital Signs BP 130/60 (BP Location: Left Arm, Patient Position: Sitting, Cuff Size: Normal)   Pulse 60   Ht 6\' 2"  (1.88 m)   Wt 169 lb 12.8 oz (77 kg)   SpO2 99%   BMI 21.80 kg/m    Physical Exam:  General- No distress,  A&Ox3, pleasant ENT: No sinus tenderness, TM clear, pale nasal mucosa, no oral exudate,no post nasal drip, no LAN Cardiac: S1, S2, regular rate and rhythm, no murmur Chest: No wheeze/ rales/ dullness; no accessory muscle use, no nasal flaring, no sternal retractions Abd.: Soft Non-tender, ND, BS +, Body mass index is 21.8 kg/m.  Ext: No clubbing cyanosis, edema, no obvious edema Neuro:  normal strength, MAE x , no obvious lesions  Psych: normal mood and behavior   Assessment/Plan New 8.3 mm left upper lobe lung nodule in a former smoker  Quit smoking 2024 Hemoptysis that started 3-4 months ago. New diagnosis of high grade adenocarcinoma of the prostate. Plan Lung nodules New 8.3 mm left upper lobe nodule suggests possible metastatic prostate cancer. Right upper lobe nodule stable.  Concern for metastatic prostate cancer vs primary lung cancer  - Request radiology overread of January 2025 PET scan for new nodule correlation. - If overread inconclusive, discuss additional PET  scan versus bronchoscopy with biopsy. - Consider bronchoscopy with biopsy if malignancy concern persists.  Hemoptysis Intermittent hemoptysis may indicate metastatic disease progression given aggressive prostate cancer and new lung nodule. - Monitor hemoptysis with lung nodule evaluation.  Chronic obstructive pulmonary disease (COPD) COPD managed with Trelegy and albuterol . Recent albuterol  shortage. Discussed potential switch to Breztri for improved management. - Refill albuterol  inhaler prescription, [facility]. - Consider Breztri trial if Trelegy insufficient.  Prostate cancer High-grade adenocarcinoma, more aggressive than previous. Radiation therapy marking scheduled for August 28, 2023, after delays. - Proceed with radiation marking and then therapy starting August 28, 2023.  Addendum. 08/03/2023 I have called the patient and his wife. Radiology was able to do an over read of the PET scan comparing it to the latest CT Chest. There appears to be growth in the 4 months between scans. ( See note above for more details) We will do an FDG Avid PET scan to better evaluate these nodules, and if they are hypermetabolic, we will proceed with bronchoscopy with biopsies.  I spent 50 minutes dedicated to the care of this  patient on the date of this encounter to include pre-visit review of records, face-to-face time with the patient discussing conditions above, post visit ordering of testing, clinical documentation with the electronic health record, making appropriate referrals as documented, and communicating necessary information to the patient's healthcare team.     Raejean Bullock, NP 08/03/2023  1:20 PM

## 2023-08-03 NOTE — Patient Instructions (Addendum)
 It is good to see you today. Your Lung cancer Screening scan shows a new lung nodule in the left upper lobe, and a stable nodule kin the right upper lobe. I will ask Radiology to do an over read of the 03/2023 PET scan to see if they see anything that correlated to the new nodule. If they do not we can discuss an additional PET scan vs  going ahead with biopsy. Continue Trelegy 1 puff once daily. Rinse mouth after use Continue Albuterol  as needed for break through shortness of breath or wheezing.  I have refilled the prescription ar [facility].  Call if you need us  sooner . Please contact office for sooner follow up if symptoms do not improve or worsen or seek emergency care

## 2023-08-03 NOTE — Progress Notes (Signed)
 History of Present Illness [name] is a 73 y.o. male former  smoker , quit 2024, followed through the Lung Cancer Screening Program.   Pt. Has consented to use of [employer] soft wear to help capture the content of this OV    08/03/2023 Pt. Presents for follow up CT Chest. 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  Dennis Stakes "[name]" is a 73 year old male who presents for follow-up for  new lung nodules identified on a lung cancer screening CT Chest. He is accompanied by his wife who is a Engineer, civil (consulting).  He is being followed in the lung cancer screening program. A recent CT scan in May 2025 showed new lung nodules, including an 8.3 mm nodule in the left upper lobe and a stable 5.3 mm nodule in the right upper lobe. The previous scan in 2024 showed only one stable nodule. The new 8.3 mm nodule was not present in the previous year's scan.  Pt. was diagnosed with prostate cancer in January 2025, although he has had prostate cancer since 2021. His PSA levels increased from 3.9 to 4.65 over six months, prompting a PET scan that showed only prostate cancer. 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 He was scheduled to start radiation therapy in June 2025 but has been delayed due to pending marker placement. There was no notation of hypermetabolic lung nodules on PET, but it was a prostate specific scan, so comparison is limited. I requested that Radiology do an over-read, comparing the current LDCT to the PET scan done 03/2023, and the previous LDCT done 07/16/2022. I am thankful they were able to do this. Dr. Lyndon Santiago did make the comparison ,as best he could within the limits of the technical difference of the scans. He noted that the irregular left upper lobe nodule on the current study is not well visualized on the prior, but may have been present on image 41, measuring 4-5 mm, progressive since 03/2023.[name] The medial right upper lobe  nodule may have been present on image 40, measuring 3 mm, progressive since January 2025.   I have discussed this with both the patient and his wife, and they are in agreement with an FDG  PET scan to better evaluate the lung nodule. We have this scheduled for tomorrow 5/20. He will follow up with me in the office 08/11/2023, and we will review the results. If the pulmonary nodules are PET avid, we will proceed with bronchoscopy with biopsies.   Pt. States  experiences occasional hemoptysis, occurring once every three to four days, with visible red blood. This has been ongoing since about February 2025. No significant weight loss, noting a weight gain of about ten pounds since quitting smoking in 2024. He has a persistent cough, and his spouse notes audible breathing before he enters a room.  He has a history of COPD and uses Trelegy and albuterol , the latter at least twice daily, although he recently ran out. He has been using Trelegy.     Test Results: LDCT chest 06/30/2023  Lungs/Pleura: New 8.3 mm irregular nodule in the posterior left upper lobe (image 160), suspicious.   New 5.3 mm nodule in the medial right upper lobe (image 160). Stable 5.3 mm nodule in the right middle lobe, benign.   Moderate centrilobular and paraseptal emphysematous changes with bullous changes in the right lower lung.   No focal consolidation.   Mild biapical  pleural-parenchymal scarring.   No pleural effusion or pneumothorax.   Upper Abdomen: Tiny hiatal hernia and vascular calcifications.   Musculoskeletal: Mild degenerative changes of the visualized thoracolumbar spine.   IMPRESSION: Lung-RADS 4B, suspicious. Additional imaging evaluation or consultation with Pulmonology or Thoracic Surgery recommended.   New 8.3 mm irregular left upper lobe nodule. Additional new 5.3 mm right upper lobe nodule. Differential considerations include primary bronchogenic carcinoma versus metastatic disease. Consider  PET-CT as clinically warranted.   PET 03/2023 CHEST   No radiotracer accumulation within mediastinal or hilar lymph nodes. No suspicious pulmonary nodules on the CT scan.   Incidental CT finding: Centrilobular seen in the upper lobes. Bullous change in the lower RIGHT upper lobe. Mucoid material within the distal trachea.  Focal activity in the LEFT lobe of the prostate gland consistent with primary prostate adenocarcinoma. No evidence of metastatic adenopathy in the pelvis or periaortic retroperitoneum. No evidence of visceral metastasis or skeletal metastasis. Emphysema in the lungs.       Latest Ref Rng & Units 11/19/2022    9:05 AM 05/13/2022    9:09 AM 05/08/2021    8:42 AM  CBC  WBC 3.4 - 10.8 x10E3/uL 8.1  10.2  7.9   Hemoglobin 13.0 - 17.7 g/dL 16.1  09.6  04.5   Hematocrit 37.5 - 51.0 % 46.2  44.9  45.9   Platelets 150 - 450 x10E3/uL 276  313  282        Latest Ref Rng & Units 11/19/2022    9:05 AM 05/13/2022    9:09 AM 05/08/2021    8:42 AM  BMP  Glucose 70 - 99 mg/dL 94  409  80   BUN 8 - 27 mg/dL 18  19  17    Creatinine 0.76 - 1.27 mg/dL 8.11  9.14  7.82   BUN/Creat Ratio 10 - 24 14  16  13    Sodium 134 - 144 mmol/L 136  138  138   Potassium 3.5 - 5.2 mmol/L 4.5  4.4  4.8   Chloride 96 - 106 mmol/L 101  101  99   CO2 20 - 29 mmol/L 24  22  24    Calcium  8.6 - 10.2 mg/dL 9.8  9.4  9.8     BNP    Component Value Date/Time   BNP 322.6 (H) 03/04/2016 2336    ProBNP No results found for: "PROBNP"  PFT No results found for: "FEV1PRE", "FEV1POST", "FVCPRE", "FVCPOST", "TLC", "DLCOUNC", "PREFEV1FVCRT", "PSTFEV1FVCRT"  No results found.   Past medical hx Past Medical History:  Diagnosis Date   Adenomatous colon polyp    Alcohol use    Antiphospholipid antibody positive    ???In the past???but not proven according to patient   Antiphospholipid antibody positive    ??? in the past, but not proven according to the patient.   Atrial flutter (HCC)     atrial flutter ablation,   Dr.Allred October 18, 2010   BPH (benign prostatic hypertrophy)    CAD (coronary artery disease)    Catheterization 2005 disease / catheterization July 06, 2010, chronic total occlusion distal RCA with collaterals from right and left side.  Medical therapy recommended, mild decreased LV function        Carotid artery disease (HCC)    0-39% R. ICA, 40-59% LICA... Doppler.... January, 2010  /  Doppler... in January, 2011 no change  /  doppler1/27/2012...stable  0-39% RICA 40-59% LICA   CHF (congestive heart failure) (HCC)    COPD (  chronic obstructive pulmonary disease) (HCC)    CRAO (central retinal artery occlusion)    Ejection fraction     EF 50-55%, echo, March, 2012... inferobasal and distal septal hypokinesis /      60%, echo, November, 2008   Emphysema of lung [facility])    GERD (gastroesophageal reflux disease)    Hypercholesterolemia    Hypertension    Myocardial infarction (HCC)    Nonsustained ventricular tachycardia (HCC)    Palpitations    probable SVT, rate 170,, at home,, lasted one hour,, 2012   Prostate cancer (HCC)    Raynaud's syndrome    Retinal artery occlusion    Stroke (HCC)    mild stroke per pt    TIA (transient ischemic attack)    Possible small vessel tia in the past.   Tobacco abuse      Social History   Tobacco Use   Smoking status: Former    Current packs/day: 0.00    Average packs/day: 0.5 packs/day for 54.0 years (27.0 ttl pk-yrs)    Types: Cigarettes    Start date: 4    Quit date: 03/17/2022    Years since quitting: 1.3   Smokeless tobacco: Never   Tobacco comments:    11/30/19 .5-1pack per day  Vaping Use   Vaping status: Never Used  Substance Use Topics   Alcohol use: Yes    Alcohol/week: 14.0 - 21.0 standard drinks of alcohol    Types: 14 - 21 Cans of beer per week   Drug use: No    Mr.Brinkley reports that he quit smoking about 16 months ago. His smoking use included cigarettes. He started smoking about 55 years  ago. He has a 27 pack-year smoking history. He has never used smokeless tobacco. He reports current alcohol use of about 14.0 - 21.0 standard drinks of alcohol per week. He reports that he does not use drugs.  Tobacco Cessation: Former smoker , Quit 2024 with a 20 + pack year smoking history  Past surgical hx, Family hx, Social hx all reviewed.  Current Outpatient Medications on File Prior to Visit  Medication Sig   albuterol  (VENTOLIN  HFA) 108 (90 Base) MCG/ACT inhaler INHALE TWO PUFFS BY MOUTH INTO LUNGS every FOUR hours AS NEEDED FOR WHEEZING AND/OR SHORTNESS OF BREATH   amiodarone  (PACERONE ) 200 MG tablet TAKE 1/2 TABLET BY MOUTH ONCE DAILY   atorvastatin  (LIPITOR) 40 MG tablet Take one tablet by mouth once daily   b complex vitamins capsule Take 1 capsule by mouth daily.   Fluticasone-Umeclidin-Vilant (TRELEGY ELLIPTA ) 100-62.5-25 MCG/ACT AEPB Inhale 1 puff into the lungs daily.   lisinopril  (ZESTRIL ) 5 MG tablet Take 1 tablet (5 mg total) by mouth daily.   metoprolol  succinate (TOPROL -XL) 25 MG 24 hr tablet Take 1 tablet (25 mg total) by mouth daily.   Multiple Vitamin (MULTIVITAMIN WITH MINERALS) TABS tablet Take 1 tablet by mouth daily.   rivaroxaban  (XARELTO ) 20 MG TABS tablet Take 1 tablet (20 mg total) by mouth daily.   No current facility-administered medications on file prior to visit.     Allergies  Allergen Reactions   Cialis [Tadalafil] Other (See Comments)    History of stroke    Levitra [Vardenafil] Other (See Comments)    History of stroke    Viagra [Sildenafil Citrate] Other (See Comments)    History of stroke     Review Of Systems:  Constitutional:   No  weight loss, night sweats,  Fevers, chills, fatigue, or  lassitude.  HEENT:   No headaches,  Difficulty swallowing,  Tooth/dental problems, or  Sore throat,                No sneezing, itching, ear ache, nasal congestion, post nasal drip,   CV:  No chest pain,  Orthopnea, PND, swelling in lower extremities,  anasarca, dizziness, palpitations, syncope.   GI  No heartburn, indigestion, abdominal pain, nausea, vomiting, diarrhea, change in bowel habits, loss of appetite, bloody stools.   Resp: + baseline  shortness of breath with exertion less at rest.  No excess mucus, no productive cough,  No non-productive cough,  + coughing up of blood.  No change in color of mucus.  No wheezing.  No chest wall deformity  Skin: no rash or lesions.  GU: no dysuria, change in color of urine, no urgency or frequency.  No flank pain, no hematuria   MS:  No joint pain or swelling.  No decreased range of motion.  No back pain.  Psych:  No change in mood or affect. No depression or anxiety.  No memory loss.   Vital Signs BP 130/60 (BP Location: Left Arm, Patient Position: Sitting, Cuff Size: Normal)   Pulse 60   Ht 6\' 2"  (1.88 m)   Wt 169 lb 12.8 oz (77 kg)   SpO2 99%   BMI 21.80 kg/m    Physical Exam:  General- No distress,  A&Ox3, pleasant ENT: No sinus tenderness, TM clear, pale nasal mucosa, no oral exudate,no post nasal drip, no LAN Cardiac: S1, S2, regular rate and rhythm, no murmur Chest: No wheeze/ rales/ dullness; no accessory muscle use, no nasal flaring, no sternal retractions Abd.: Soft Non-tender, ND, BS +, Body mass index is 21.8 kg/m.  Ext: No clubbing cyanosis, edema, no obvious edema Neuro:  normal strength, MAE x , no obvious lesions  Psych: normal mood and behavior   Assessment/Plan New 8.3 mm left upper lobe lung nodule in a former smoker  Quit smoking 2024 Hemoptysis that started 3-4 months ago. New diagnosis of high grade adenocarcinoma of the prostate. Plan Lung nodules New 8.3 mm left upper lobe nodule suggests possible metastatic prostate cancer. Right upper lobe nodule stable.  Concern for metastatic prostate cancer vs primary lung cancer  - Request radiology overread of January 2025 PET scan for new nodule correlation. - If overread inconclusive, discuss additional PET  scan versus bronchoscopy with biopsy. - Consider bronchoscopy with biopsy if malignancy concern persists.  Hemoptysis Intermittent hemoptysis may indicate metastatic disease progression given aggressive prostate cancer and new lung nodule. - Monitor hemoptysis with lung nodule evaluation.  Chronic obstructive pulmonary disease (COPD) COPD managed with Trelegy and albuterol . Recent albuterol  shortage. Discussed potential switch to Breztri for improved management. - Refill albuterol  inhaler prescription, send to [facility]. - Consider Breztri trial if Trelegy insufficient.  Prostate cancer High-grade adenocarcinoma, more aggressive than previous. Radiation therapy marking scheduled for August 28, 2023, after delays. - Proceed with radiation marking and then therapy starting August 28, 2023.  Addendum. 08/03/2023 I have called the patient and his wife. Radiology was able to do an over read of the PET scan comparing it to the latest CT Chest. There appears to be growth in the 4 months between scans. ( See note above for more details) We will do an FDG Avid PET scan to better evaluate these nodules, and if they are hypermetabolic, we will proceed with bronchoscopy with biopsies.  I spent 50 minutes dedicated to the care of this  patient on the date of this encounter to include pre-visit review of records, face-to-face time with the patient discussing conditions above, post visit ordering of testing, clinical documentation with the electronic health record, making appropriate referrals as documented, and communicating necessary information to the patient's healthcare team.     Raejean Bullock, NP 08/03/2023  1:20 PM

## 2023-08-03 NOTE — Telephone Encounter (Signed)
 Called patient to inform of scan as well as inform to call about scheduling

## 2023-08-03 NOTE — Telephone Encounter (Signed)
 I have called the patient with  the results of the CT/ PET over read done by Dr.Krishnan . We have reviewed the results. ( See OV note). Both patient and his wife are in agreement with an FDG avid  PET scan now, and then bronchoscopy with biopsies if PET scan is hypermetabolic.

## 2023-08-04 ENCOUNTER — Ambulatory Visit
Admission: RE | Admit: 2023-08-04 | Discharge: 2023-08-04 | Disposition: A | Discharge status: Home / Self Care | Source: Ambulatory Visit | Attending: Acute Care | Admitting: Acute Care

## 2023-08-04 DIAGNOSIS — R911 Solitary pulmonary nodule: Secondary | ICD-10-CM | POA: Diagnosis present

## 2023-08-04 DIAGNOSIS — R918 Other nonspecific abnormal finding of lung field: Secondary | ICD-10-CM | POA: Insufficient documentation

## 2023-08-04 DIAGNOSIS — Z8546 Personal history of malignant neoplasm of prostate: Secondary | ICD-10-CM | POA: Diagnosis not present

## 2023-08-04 LAB — GLUCOSE, CAPILLARY: Glucose-Capillary: 65 mg/dL — ABNORMAL LOW (ref 70–99)

## 2023-08-04 MED ORDER — FLUDEOXYGLUCOSE F - 18 (FDG) INJECTION
8.8000 | Freq: Once | INTRAVENOUS | Status: AC | PRN
  Administered 2023-08-04: 9.44 via INTRAVENOUS

## 2023-08-06 ENCOUNTER — Telehealth: Payer: Self-pay

## 2023-08-06 NOTE — Telephone Encounter (Signed)
 Pt has reviewed recent PET scan results in mychart. Pt called asking if Groce, NP wanted to go ahead and schedule him for a bronch. Message sent to provider.

## 2023-08-07 ENCOUNTER — Telehealth: Payer: Self-pay | Admitting: Pulmonary Disease

## 2023-08-07 ENCOUNTER — Telehealth: Payer: Self-pay | Admitting: Acute Care

## 2023-08-07 DIAGNOSIS — R911 Solitary pulmonary nodule: Secondary | ICD-10-CM | POA: Insufficient documentation

## 2023-08-07 NOTE — Telephone Encounter (Signed)
 Scheduling for RANB at [facility].

## 2023-08-07 NOTE — Telephone Encounter (Signed)
 I have called the patient with the results of the PET scan.

## 2023-08-07 NOTE — Telephone Encounter (Addendum)
 I have called the patient with the results of his PET scan.  There was no abnormal uptake in the axillary regions, hilum or mediastinum.  However there are some areas of abnormal uptake along lung nodules.  The left upper lobe nodule has a maximum SUV of 6.9, measuring 9 mm.  On CT this measured 8 mm. There is also a right central upper lobe nodule just posterior to the right upper lobe hilum with an abnormal uptake max SUV of 3.8.  This lesion measured 5 mm on recent CT.  There is a small nodule in the middle lobe with a slight uptake of 0.7 SUV and is unchanged from previous scans in the past.  There is some uptake along the posterior right paraspinal musculature likely physiologic.  We discussed that there are 2 abnormal lung nodules 1 in each upper lobe worrisome for developing malignancy.  With patient's current history of recently diagnosed prostate cancer, we will plan to biopsy for tissue diagnosis. Patient is in agreement with this plan. We will get him scheduled for bronchoscopy with biopsy on either Thursday May 29 or on Tuesday, June 3 based on availability of both the provider in the endoscopy suite.  I have reached out to Dr. Lucina Sabal , one of the pulmonary internationalists in [place]. He is available to come to [place] on 5/29 to do the bronch. He has placed the orders, and also an order for a Super D CT Chest. He will do a televisit with the patient on Wednesday  5/28. Pt. Knows to hold Xarelto   on Tuesday 5/27, and Wednesday 5/28 for the procedure.   Patient is in agreement with this plan.  He has given informed consent to move forward with the biopsy.  I have asked him to call the office for any further questions or concerns.

## 2023-08-07 NOTE — Telephone Encounter (Signed)
 Margit Shelling, NP to call patient back this afternoon.

## 2023-08-11 ENCOUNTER — Telehealth: Payer: Self-pay

## 2023-08-11 ENCOUNTER — Encounter: Payer: Self-pay | Admitting: Pulmonary Disease

## 2023-08-11 ENCOUNTER — Ambulatory Visit: Admitting: Acute Care

## 2023-08-11 NOTE — Telephone Encounter (Signed)
-----   Message from Annitta Kindler sent at 08/07/2023  4:25 PM EDT ----- Hello Team,  Can we please schedule this patient for a CT chest Super D 05/28 and RANB + EBUS on 05/29. Orders are in. And can we pleas schedule him for a Televisit with me on 05/28 to go over the procedure. Thanks.   JP

## 2023-08-11 NOTE — Telephone Encounter (Signed)
 Bronchoscopy is scheduled for 5/29 at 9:45am. Patient will arrive at 7:15am. CT is scheduled for 5/28 at 6:30am, patient will arrive at 6:00am. Virtual Visit with Dr. Lucina Sabal is scheduled for 5/28 at 11:00am.  Patient is aware of all date and times.

## 2023-08-11 NOTE — Telephone Encounter (Signed)
 Robotic Bronch with EBUS 08/13/2023 at 9:45am [facility] Lung Nodule [id number], [id number], [id number]  [name] please see Bronch info.

## 2023-08-12 ENCOUNTER — Ambulatory Visit: Admitting: Pulmonary Disease

## 2023-08-12 ENCOUNTER — Encounter (HOSPITAL_COMMUNITY): Payer: Self-pay | Admitting: Pulmonary Disease

## 2023-08-12 ENCOUNTER — Other Ambulatory Visit: Payer: Self-pay

## 2023-08-12 ENCOUNTER — Ambulatory Visit: Admitting: Acute Care

## 2023-08-12 ENCOUNTER — Ambulatory Visit (HOSPITAL_COMMUNITY)
Admission: RE | Admit: 2023-08-12 | Discharge: 2023-08-12 | Disposition: A | Discharge status: Home / Self Care | Source: Ambulatory Visit | Attending: Pulmonary Disease | Admitting: Pulmonary Disease

## 2023-08-12 ENCOUNTER — Telehealth: Admitting: Pulmonary Disease

## 2023-08-12 DIAGNOSIS — I700 Atherosclerosis of aorta: Secondary | ICD-10-CM | POA: Diagnosis not present

## 2023-08-12 DIAGNOSIS — J439 Emphysema, unspecified: Secondary | ICD-10-CM | POA: Diagnosis not present

## 2023-08-12 DIAGNOSIS — R918 Other nonspecific abnormal finding of lung field: Secondary | ICD-10-CM | POA: Diagnosis not present

## 2023-08-12 DIAGNOSIS — R911 Solitary pulmonary nodule: Secondary | ICD-10-CM | POA: Insufficient documentation

## 2023-08-12 DIAGNOSIS — C61 Malignant neoplasm of prostate: Secondary | ICD-10-CM | POA: Diagnosis not present

## 2023-08-12 NOTE — Telephone Encounter (Signed)
 For the codes 30865, [id number], [id number] Auth # [id number] valid 08/11/23 to 11/09/2023

## 2023-08-12 NOTE — Progress Notes (Signed)
 Anesthesia Chart Review: Same day workup  73 yo male follows with cardiology for hx of pAFib on Xarelto , chronic systolic heart failure with recovery of EF, alcohol use, CAD (cath 2012 showed CTO of the distal RCA with collaterals from right to left, medical therapy recommended), carotid artery disease, hypertension, hyperlipidemia, palpitations, stroke, tobacco abuse. Seen by Janis Melena, PA-C on 05/19/23 for preop eval. Per note, "[name]'s perioperative risk of a major cardiac event is 11% according to the Revised Cardiac Risk Index (RCRI).  Therefore, he is at high risk for perioperative complications.   His functional capacity is good at 5.07 METs according to the Duke Activity Status Index (DASI). Recommendations: According to ACC/AHA guidelines, no further cardiovascular testing needed.  The patient may proceed to surgery at acceptable risk. Antiplatelet and/or Anticoagulation Recommendations: Xarelto  (Rivaroxaban ) can be held for 3 days prior to surgery.  Please resume post op when felt to be safe.  "  Recently evaluated by pulmonology for new lung nodule in former smoker (quit 2024). He also reported intermittent hemoptysis. PET scan showed 2 hypermetabolic lung nodules in each upper love worrisome for developing malignancy. He is also maintained on Breztri for COPD.   Other pertinent hx includes Raynaud's, recent diagnosis of high grade adenocarcinoma of the prostate, remote CRAO.  Pt reports LD Xarelto  08/10/23.  Pt will need DOS labs and evaluation.   EKG 05/19/23: Normal sinus rhythm. Rate 69. Rightward axis. ST & T wave abnormality, consider inferior ischemia  Echocardiogram 10/19/2019 IMPRESSIONS   1. Left ventricular ejection fraction, by estimation, is 60 to 65%. The  left ventricle has normal function. The left ventricle has no regional  wall motion abnormalities. Left ventricular diastolic parameters were  normal.   2. Right ventricular systolic function is normal. The right  ventricular  size is normal.   3. The mitral valve is grossly normal. Trivial mitral valve  regurgitation.   4. The aortic valve is normal in structure. Aortic valve regurgitation is  not visualized. No aortic stenosis is present.    Carotid ultrasound 04/05/2020 Summary:  Right Carotid: Velocities in the right ICA are consistent with a 1-39% stenosis. Non-hemodynamically significant plaque <50% noted in the CCA. The  ECA appears >50% stenosed.   Left Carotid: Velocities in the left ICA are consistent with a 1-39% stenosis.   Vertebrals: Bilateral vertebral arteries demonstrate antegrade flow.  Subclavians: Normal flow hemodynamics were seen in bilateral subclavian arteries.     [name] [facility] Short Stay Center/Anesthesiology Phone [phone] 08/12/2023 1:09 PM

## 2023-08-12 NOTE — Anesthesia Preprocedure Evaluation (Signed)
 Anesthesia Evaluation  Patient identified by MRN, date of birth, ID band Patient awake    Reviewed: Allergy & Precautions, H&P , NPO status , Patient's Chart, lab work & pertinent test results  Airway Mallampati: II  TM Distance: >3 FB Neck ROM: Full    Dental no notable dental hx. (+) Partial Lower, Partial Upper, Dental Advisory Given   Pulmonary neg pulmonary ROS, COPD,  COPD inhaler, former smoker   Pulmonary exam normal breath sounds clear to auscultation       Cardiovascular hypertension, On Medications and On Home Beta Blockers + CAD and +CHF   Rhythm:Regular Rate:Normal     Neuro/Psych TIANo Residual Symptoms  negative psych ROS   GI/Hepatic Neg liver ROS,GERD  ,,  Endo/Other  negative endocrine ROS    Renal/GU negative Renal ROS  negative genitourinary   Musculoskeletal  (+) Arthritis , Osteoarthritis,    Abdominal   Peds  Hematology negative hematology ROS (+)   Anesthesia Other Findings   Reproductive/Obstetrics negative OB ROS                             Anesthesia Physical Anesthesia Plan  ASA: 3  Anesthesia Plan: General   Post-op Pain Management: Minimal or no pain anticipated   Induction: Intravenous  PONV Risk Score and Plan: 3 and Ondansetron , Dexamethasone , Propofol  infusion and TIVA  Airway Management Planned: Oral ETT  Additional Equipment:   Intra-op Plan:   Post-operative Plan: Extubation in OR  Informed Consent: I have reviewed the patients History and Physical, chart, labs and discussed the procedure including the risks, benefits and alternatives for the proposed anesthesia with the patient or authorized representative who has indicated his/her understanding and acceptance.     Dental advisory given  Plan Discussed with: CRNA  Anesthesia Plan Comments: (PAT note by Rudy Costain, PA-C: Same day workup  73 yo male follows with cardiology for hx  of pAFib on Xarelto , chronic systolic heart failure with recovery of EF, alcohol use, CAD (cath 2012 showed CTO of the distal RCA with collaterals from right to left, medical therapy recommended), carotid artery disease, hypertension, hyperlipidemia, palpitations, stroke, tobacco abuse. Seen by Janis Melena, PA-C on 05/19/23 for preop eval. Per note, "Mr. Evett's perioperative risk of a major cardiac event is 11% according to the Revised Cardiac Risk Index (RCRI).  Therefore, he is at high risk for perioperative complications.   His functional capacity is good at 5.07 METs according to the Duke Activity Status Index (DASI). Recommendations: According to ACC/AHA guidelines, no further cardiovascular testing needed.  The patient may proceed to surgery at acceptable risk. Antiplatelet and/or Anticoagulation Recommendations: Xarelto  (Rivaroxaban ) can be held for 3 days prior to surgery.  Please resume post op when felt to be safe.  "  Recently evaluated by pulmonology for new lung nodule in former smoker (quit 2024). He also reported intermittent hemoptysis. PET scan showed 2 hypermetabolic lung nodules in each upper love worrisome for developing malignancy. He is also maintained on Breztri for COPD.   Other pertinent hx includes Raynaud's, recent diagnosis of high grade adenocarcinoma of the prostate, remote CRAO.  Pt reports LD Xarelto  08/10/23.  Pt will need DOS labs and evaluation.   EKG 05/19/23: Normal sinus rhythm. Rate 69. Rightward axis. ST & T wave abnormality, consider inferior ischemia  Echocardiogram 10/19/2019 IMPRESSIONS  1. Left ventricular ejection fraction, by estimation, is 60 to 65%. The  left ventricle has normal function.  The left ventricle has no regional  wall motion abnormalities. Left ventricular diastolic parameters were  normal.  2. Right ventricular systolic function is normal. The right ventricular  size is normal.  3. The mitral valve is grossly normal. Trivial mitral valve   regurgitation.  4. The aortic valve is normal in structure. Aortic valve regurgitation is  not visualized. No aortic stenosis is present.   Carotid ultrasound 04/05/2020 Summary:  Right Carotid: Velocities in the right ICA are consistent with a 1-39% stenosis. Non-hemodynamically significant plaque <50% noted in the CCA. The  ECA appears >50% stenosed.   Left Carotid: Velocities in the left ICA are consistent with a 1-39% stenosis.   Vertebrals:Bilateral vertebral arteries demonstrate antegrade flow.  Subclavians: Normal flow hemodynamics were seen in bilateral subclavian arteries.    )        Anesthesia Quick Evaluation

## 2023-08-12 NOTE — Progress Notes (Signed)
 Called in Mr. Kassel to discuss scheduled RANB for tomorrow. Discussed risks of procedure including Pneumothorax and bleeding that Occur in less than 2% of the times. Mr. Limbach is agreable to proceed with the procedure tomorrow morning.   Annitta Kindler, MD [facility] 08/12/2023 9:32 PM

## 2023-08-12 NOTE — Progress Notes (Signed)
 SDW call  Patient was given pre-op instructions over the phone. Patient verbalized understanding of instructions provided.     PCP - Dr. Bambi Lever Cardiologist - Dr. Dorothye Gathers, clearance 06/16/2023 Pulmonary:    PPM/ICD - denies Device Orders - na Rep Notified - na   Chest x-ray - na EKG -  3/114/2025 Stress Test - ECHO - 11/27/2021 Cardiac Cath - 2005  Sleep Study/sleep apnea/CPAP: denies  Non-diabetic  Blood Thinner Instructions: Xarelto , hold 3 days per Cardiology, states last dose evening of 08/10/2023 Aspirin Instructions: denies   ERAS Protcol - NPO   Anesthesia review: Yes. A-flutter on xarelto , COPD, stroke, CHF, CAD, HTN, MI, emphysema   Patient denies shortness of breath, fever, cough and chest pain over the phone call  Your procedure is scheduled on Thursday Aug 13, 2023  Report to [facility] Main Entrance "A" at  0715  A.M., then check in with the Admitting office.  Call this number if you have problems the morning of surgery:  [phone]   If you have any questions prior to your surgery date call [phone]: Open Monday-Friday 8am-4pm If you experience any cold or flu symptoms such as cough, fever, chills, shortness of breath, etc. between now and your scheduled surgery, please notify us  at the above number    Remember:  Do not eat or drink after midnight the night before your surgery  Take these medicines the morning of surgery with A SIP OF WATER:  Amiodarone , atorvastatin , trellegy ellipta, metoprolol   As needed: albuterol   As of today, STOP taking any Aspirin (unless otherwise instructed by your surgeon) Aleve, Naproxen, Ibuprofen, Motrin, Advil, Goody's, BC's, all herbal medications, fish oil, and all vitamins.

## 2023-08-12 NOTE — Telephone Encounter (Signed)
 Noted. Nothing further needed.

## 2023-08-13 ENCOUNTER — Ambulatory Visit (HOSPITAL_COMMUNITY)

## 2023-08-13 ENCOUNTER — Ambulatory Visit (HOSPITAL_COMMUNITY): Payer: Self-pay | Admitting: Physician Assistant

## 2023-08-13 ENCOUNTER — Other Ambulatory Visit (HOSPITAL_COMMUNITY)

## 2023-08-13 ENCOUNTER — Encounter (HOSPITAL_COMMUNITY): Payer: Self-pay | Admitting: Pulmonary Disease

## 2023-08-13 ENCOUNTER — Ambulatory Visit (HOSPITAL_COMMUNITY)
Admission: RE | Admit: 2023-08-13 | Discharge: 2023-08-13 | Disposition: A | Discharge status: Home / Self Care | Attending: Pulmonary Disease | Admitting: Pulmonary Disease

## 2023-08-13 ENCOUNTER — Other Ambulatory Visit: Payer: Self-pay

## 2023-08-13 ENCOUNTER — Encounter (HOSPITAL_COMMUNITY)
Admission: RE | Discharge status: Home / Self Care | Payer: Self-pay | Source: Home / Self Care | Attending: Pulmonary Disease

## 2023-08-13 DIAGNOSIS — I2510 Atherosclerotic heart disease of native coronary artery without angina pectoris: Secondary | ICD-10-CM | POA: Diagnosis not present

## 2023-08-13 DIAGNOSIS — I4891 Unspecified atrial fibrillation: Secondary | ICD-10-CM | POA: Diagnosis not present

## 2023-08-13 DIAGNOSIS — T486X6A Underdosing of antiasthmatics, initial encounter: Secondary | ICD-10-CM | POA: Insufficient documentation

## 2023-08-13 DIAGNOSIS — Z8673 Personal history of transient ischemic attack (TIA), and cerebral infarction without residual deficits: Secondary | ICD-10-CM | POA: Diagnosis not present

## 2023-08-13 DIAGNOSIS — Z87891 Personal history of nicotine dependence: Secondary | ICD-10-CM | POA: Diagnosis not present

## 2023-08-13 DIAGNOSIS — Z48813 Encounter for surgical aftercare following surgery on the respiratory system: Secondary | ICD-10-CM | POA: Diagnosis not present

## 2023-08-13 DIAGNOSIS — J449 Chronic obstructive pulmonary disease, unspecified: Secondary | ICD-10-CM | POA: Diagnosis not present

## 2023-08-13 DIAGNOSIS — I7300 Raynaud's syndrome without gangrene: Secondary | ICD-10-CM | POA: Insufficient documentation

## 2023-08-13 DIAGNOSIS — J439 Emphysema, unspecified: Secondary | ICD-10-CM | POA: Diagnosis not present

## 2023-08-13 DIAGNOSIS — I5022 Chronic systolic (congestive) heart failure: Secondary | ICD-10-CM | POA: Insufficient documentation

## 2023-08-13 DIAGNOSIS — R918 Other nonspecific abnormal finding of lung field: Secondary | ICD-10-CM | POA: Diagnosis not present

## 2023-08-13 DIAGNOSIS — Z91138 Patient's unintentional underdosing of medication regimen for other reason: Secondary | ICD-10-CM | POA: Diagnosis not present

## 2023-08-13 DIAGNOSIS — Z7901 Long term (current) use of anticoagulants: Secondary | ICD-10-CM | POA: Insufficient documentation

## 2023-08-13 DIAGNOSIS — I110 Hypertensive heart disease with heart failure: Secondary | ICD-10-CM | POA: Diagnosis not present

## 2023-08-13 DIAGNOSIS — R911 Solitary pulmonary nodule: Secondary | ICD-10-CM

## 2023-08-13 DIAGNOSIS — C61 Malignant neoplasm of prostate: Secondary | ICD-10-CM | POA: Insufficient documentation

## 2023-08-13 DIAGNOSIS — Z79899 Other long term (current) drug therapy: Secondary | ICD-10-CM | POA: Insufficient documentation

## 2023-08-13 DIAGNOSIS — R042 Hemoptysis: Secondary | ICD-10-CM | POA: Diagnosis not present

## 2023-08-13 DIAGNOSIS — E785 Hyperlipidemia, unspecified: Secondary | ICD-10-CM | POA: Insufficient documentation

## 2023-08-13 DIAGNOSIS — C4320 Malignant melanoma of unspecified ear and external auricular canal: Secondary | ICD-10-CM | POA: Diagnosis not present

## 2023-08-13 LAB — CBC
HCT: 48.6 % (ref 39.0–52.0)
Hemoglobin: 16.7 g/dL (ref 13.0–17.0)
MCH: 32.6 pg (ref 26.0–34.0)
MCHC: 34.4 g/dL (ref 30.0–36.0)
MCV: 94.9 fL (ref 80.0–100.0)
Platelets: 270 10*3/uL (ref 150–400)
RBC: 5.12 MIL/uL (ref 4.22–5.81)
RDW: 12.9 % (ref 11.5–15.5)
WBC: 8.4 10*3/uL (ref 4.0–10.5)
nRBC: 0 % (ref 0.0–0.2)

## 2023-08-13 LAB — COMPREHENSIVE METABOLIC PANEL WITH GFR
ALT: 27 U/L (ref 0–44)
AST: 30 U/L (ref 15–41)
Albumin: 4.3 g/dL (ref 3.5–5.0)
Alkaline Phosphatase: 65 U/L (ref 38–126)
Anion gap: 6 (ref 5–15)
BUN: 14 mg/dL (ref 8–23)
CO2: 23 mmol/L (ref 22–32)
Calcium: 9.4 mg/dL (ref 8.9–10.3)
Chloride: 107 mmol/L (ref 98–111)
Creatinine, Ser: 1.21 mg/dL (ref 0.61–1.24)
GFR, Estimated: >60 mL/min (ref >60)
Glucose, Bld: 96 mg/dL (ref 70–99)
Potassium: 4.7 mmol/L (ref 3.5–5.1)
Sodium: 136 mmol/L (ref 135–145)
Total Bilirubin: 0.9 mg/dL (ref 0.0–1.2)
Total Protein: 7.2 g/dL (ref 6.5–8.1)

## 2023-08-13 SURGERY — BRONCHOSCOPY, WITH BIOPSY USING ELECTROMAGNETIC NAVIGATION
Anesthesia: General | Laterality: Bilateral

## 2023-08-13 MED ORDER — CHLORHEXIDINE GLUCONATE 0.12 % MT SOLN
15.0000 mL | Freq: Once | OROMUCOSAL | Status: AC
  Administered 2023-08-13: 15 mL via OROMUCOSAL
  Filled 2023-08-13: qty 15

## 2023-08-13 MED ORDER — SUGAMMADEX SODIUM 200 MG/2ML IV SOLN
INTRAVENOUS | Status: DC | PRN
  Administered 2023-08-13: 200 mg via INTRAVENOUS

## 2023-08-13 MED ORDER — LIDOCAINE 2% (20 MG/ML) 5 ML SYRINGE
INTRAMUSCULAR | Status: DC | PRN
  Administered 2023-08-13: 60 mg via INTRAVENOUS

## 2023-08-13 MED ORDER — ROCURONIUM BROMIDE 10 MG/ML (PF) SYRINGE
PREFILLED_SYRINGE | INTRAVENOUS | Status: DC | PRN
  Administered 2023-08-13: 40 mg via INTRAVENOUS
  Administered 2023-08-13: 30 mg via INTRAVENOUS

## 2023-08-13 MED ORDER — DEXAMETHASONE SODIUM PHOSPHATE 10 MG/ML IJ SOLN
INTRAMUSCULAR | Status: DC | PRN
  Administered 2023-08-13: 10 mg via INTRAVENOUS

## 2023-08-13 MED ORDER — PROPOFOL 10 MG/ML IV BOLUS
INTRAVENOUS | Status: DC | PRN
  Administered 2023-08-13: 100 mg via INTRAVENOUS

## 2023-08-13 MED ORDER — FENTANYL CITRATE (PF) 100 MCG/2ML IJ SOLN
INTRAMUSCULAR | Status: AC
Start: 2023-08-13 — End: ?
  Filled 2023-08-13: qty 2

## 2023-08-13 MED ORDER — ONDANSETRON HCL 4 MG/2ML IJ SOLN
INTRAMUSCULAR | Status: DC | PRN
  Administered 2023-08-13: 4 mg via INTRAVENOUS

## 2023-08-13 MED ORDER — PHENYLEPHRINE HCL-NACL 20-0.9 MG/250ML-% IV SOLN
INTRAVENOUS | Status: DC | PRN
  Administered 2023-08-13: 50 ug/min via INTRAVENOUS

## 2023-08-13 MED ORDER — FENTANYL CITRATE (PF) 250 MCG/5ML IJ SOLN
INTRAMUSCULAR | Status: DC | PRN
  Administered 2023-08-13 (×2): 50 ug via INTRAVENOUS

## 2023-08-13 MED ORDER — LACTATED RINGERS IV SOLN: INTRAVENOUS | Status: DC

## 2023-08-13 MED ORDER — PROPOFOL 500 MG/50ML IV EMUL
INTRAVENOUS | Status: DC | PRN
  Administered 2023-08-13: 100 ug/kg/min via INTRAVENOUS

## 2023-08-13 NOTE — Transfer of Care (Signed)
 Immediate Anesthesia Transfer of Care Note  Patient: KEAHI MCCARNEY  Procedure(s) Performed: ROBOTIC ASSISTED NAVIGATIONAL BRONCHOSCOPY (Bilateral) VIDEO BRONCHOSCOPY WITH RADIAL ENDOBRONCHIAL ULTRASOUND BRONCHOSCOPY, WITH NEEDLE ASPIRATION BIOPSY IRRIGATION, BRONCHUS BRONCHOSCOPY, WITH BRUSH BIOPSY  Patient Location: PACU  Anesthesia Type:General  Level of Consciousness: awake and alert   Airway & Oxygen Therapy: Patient Spontanous Breathing and Patient connected to face mask oxygen  Post-op Assessment: Report given to RN and Post -op Vital signs reviewed and stable  Post vital signs: Reviewed and stable  Last Vitals:  Vitals Value Taken Time  BP    Temp    Pulse 63 08/13/23 1154  Resp 18 08/13/23 1154  SpO2 98 % 08/13/23 1154  Vitals shown include unfiled device data.  Last Pain:  Vitals:   08/13/23 0800  TempSrc:   PainSc: 0-No pain         Complications: No notable events documented.

## 2023-08-13 NOTE — Interval H&P Note (Signed)
 History and Physical Interval Note:  08/13/2023 9:17 AM  [name]  has presented today for surgery, with the diagnosis of Right and Left upper lobe nodules.  The various methods of treatment have been discussed with the patient and family. After consideration of risks, benefits and other options for treatment, the patient has consented to  Procedure(s): ROBOTIC ASSISTED NAVIGATIONAL BRONCHOSCOPY (Bilateral) ENDOBRONCHIAL ULTRASOUND (EBUS) (Bilateral) as a surgical intervention.  The patient's history has been reviewed, patient examined, no change in status, stable for surgery.  I have reviewed the patient's chart and labs.  Questions were answered to the patient's satisfaction.     [name]

## 2023-08-13 NOTE — Anesthesia Procedure Notes (Signed)
 Procedure Name: Intubation Date/Time: 08/13/2023 10:26 AM  Performed by: Johann Muta, CRNAPre-anesthesia Checklist: Patient identified, Emergency Drugs available, Suction available and Patient being monitored Patient Re-evaluated:Patient Re-evaluated prior to induction Oxygen Delivery Method: Circle system utilized Preoxygenation: Pre-oxygenation with 100% oxygen Induction Type: IV induction Ventilation: Mask ventilation without difficulty Laryngoscope Size: Mac and 4 Grade View: Grade II Tube type: Oral Tube size: 8.5 mm Number of attempts: 1 Airway Equipment and Method: Stylet and Oral airway Placement Confirmation: ETT inserted through vocal cords under direct vision, positive ETCO2 and breath sounds checked- equal and bilateral Secured at: 23 cm Tube secured with: Tape Dental Injury: Teeth and Oropharynx as per pre-operative assessment

## 2023-08-13 NOTE — Anesthesia Postprocedure Evaluation (Signed)
 Anesthesia Post Note  Patient: MYKA LUKINS  Procedure(s) Performed: ROBOTIC ASSISTED NAVIGATIONAL BRONCHOSCOPY (Bilateral) VIDEO BRONCHOSCOPY WITH RADIAL ENDOBRONCHIAL ULTRASOUND BRONCHOSCOPY, WITH NEEDLE ASPIRATION BIOPSY IRRIGATION, BRONCHUS BRONCHOSCOPY, WITH BRUSH BIOPSY     Patient location during evaluation: PACU Anesthesia Type: General Level of consciousness: awake and alert Pain management: pain level controlled Vital Signs Assessment: post-procedure vital signs reviewed and stable Respiratory status: spontaneous breathing, nonlabored ventilation and respiratory function stable Cardiovascular status: blood pressure returned to baseline and stable Postop Assessment: no apparent nausea or vomiting Anesthetic complications: no  No notable events documented.  Last Vitals:  Vitals:   08/13/23 1220 08/13/23 1230  BP: (!) 115/51 (!) 139/55  Pulse: (!) 48 (!) 48  Resp: 18 16  Temp:    SpO2: 92% 92%    Last Pain:  Vitals:   08/13/23 1230  TempSrc:   PainSc: 0-No pain                 Peytin Dechert,W. EDMOND

## 2023-08-13 NOTE — Op Note (Signed)
 Video Bronchoscopy with Robotic Assisted Bronchoscopic Navigation   Date of Operation: 08/13/2023   Pre-op Diagnosis: Left upper lobe nodule  Post-op Diagnosis: Left upper lobe nodule   Surgeon: Avriana Joo  Anesthesia: General endotracheal anesthesia  Operation: Flexible video fiberoptic bronchoscopy with robotic assistance and biopsies.  Estimated Blood Loss: Minimal  Complications: None  Indications and History: [name] is a 73 y.o. male with history of Prostate Cancer and Left upper lobe nodule as well as right upper lobe nodule. RUL deemed extremely small and unable to biopsy.  Recommendation made to achieve a tissue diagnosis via robotic assisted navigational bronchoscopy. The risks, benefits, complications, treatment options and expected outcomes were discussed with the patient. The possibilities of pneumothorax, pneumonia, reaction to medication, pulmonary aspiration, perforation of a viscus, bleeding, failure to diagnose a condition and creating a complication requiring transfusion or operation were discussed with the patient who freely signed the consent.    Description of Procedure: The patient was seen in the Preoperative Area, was examined and was deemed appropriate to proceed. The patient was taken to [facility] Endoscopy room 3, identified as [name] and the procedure verified as Flexible Video Fiberoptic Bronchoscopy.  A Time Out was held and the above information confirmed.   Prior to the date of the procedure a high-resolution CT scan of the chest was performed. Utilizing ION software program a virtual tracheobronchial tree was generated to allow the creation of distinct navigation pathways to the patient's parenchymal abnormalities. After being taken to the operating room general anesthesia was initiated and the patient  was orally intubated. The video fiberoptic bronchoscope was introduced via the endotracheal tube and a general inspection was performed which showed normal  right and left lung anatomy. Aspiration of the bilateral mainstems was completed to remove any remaining secretions. Robotic catheter inserted into patient's endotracheal tube.   Target #1 LUL Nodule: The distinct navigation pathways prepared prior to this procedure were then utilized to navigate to patient's lesion identified on CT scan. The robotic catheter was secured into place and the vision probe was withdrawn.  Lesion location was approximated using fluoroscopy.  Local registration and targeting was performed using Siemens [employer] Cios mobile C-arm three-dimensional imaging and radial EBUS. Under fluoroscopic guidance transbronchial needle brushings and transbronchial needle biopsies were performed to be sent for cytology and pathology.  Needle-in-lesion was confirmed using Cios mobile C-arm.  A bronchioalveolar lavage was performed in the Left upper lobe and sent for cytology.    At the end of the procedure a general airway inspection was performed and there was no evidence of active bleeding. The bronchoscope was removed.  The patient tolerated the procedure well. There was no significant blood loss and there were no obvious complications. A post-procedural chest x-ray is pending.  Samples Target #1: 1. Transbronchial needle brushings from LUL Nodule 2. Transbronchial  needle biopsies from LUL Nodule 4. Bronchoalveolar lavage from LUL  Plans:  The patient will be discharged from the PACU to home when recovered from anesthesia and after chest x-ray is reviewed. We will review the cytology, pathology and microbiology results with the patient when they become available. Outpatient followup will be with Dara Ear.    Annitta Kindler, MD [facility] 08/13/2023 11:55 AM    Tool-in-lesion Left upper lobe nodule

## 2023-08-14 ENCOUNTER — Encounter (HOSPITAL_COMMUNITY): Payer: Self-pay | Admitting: Pulmonary Disease

## 2023-08-14 LAB — CYTOLOGY - NON PAP

## 2023-08-17 ENCOUNTER — Telehealth: Payer: Self-pay | Admitting: Pulmonary Disease

## 2023-08-17 DIAGNOSIS — R911 Solitary pulmonary nodule: Secondary | ICD-10-CM

## 2023-08-17 NOTE — Telephone Encounter (Signed)
 Discussed lung biopsy results with [name]. FNA, brushing and BAL are all negative for malignancy. Clearly relayed that their might be a probability of false negative and we will need to continue to follow up on this radiologically. [name] and his wife are agreeable and will plan to repeat a CT chest in 3 months.   Annitta Kindler, MD [facility] 08/17/2023 1:39 PM

## 2023-08-19 ENCOUNTER — Telehealth: Payer: Self-pay

## 2023-08-19 NOTE — Telephone Encounter (Signed)
-----   Message from Annitta Kindler sent at 08/19/2023 10:33 AM EDT ----- [name],  Can we please schedule this patient to see me in office with the repeat CT chest in 3 months. Thanks.

## 2023-08-21 ENCOUNTER — Encounter (HOSPITAL_COMMUNITY): Payer: Self-pay | Admitting: Urology

## 2023-08-21 ENCOUNTER — Ambulatory Visit: Admitting: Acute Care

## 2023-08-21 ENCOUNTER — Encounter: Payer: Self-pay | Admitting: Acute Care

## 2023-08-21 VITALS — BP 147/72 | HR 54 | Ht 74.0 in | Wt 168.6 lb

## 2023-08-21 DIAGNOSIS — C61 Malignant neoplasm of prostate: Secondary | ICD-10-CM | POA: Diagnosis not present

## 2023-08-21 DIAGNOSIS — C679 Malignant neoplasm of bladder, unspecified: Secondary | ICD-10-CM | POA: Diagnosis not present

## 2023-08-21 DIAGNOSIS — Z9889 Other specified postprocedural states: Secondary | ICD-10-CM | POA: Diagnosis not present

## 2023-08-21 DIAGNOSIS — R911 Solitary pulmonary nodule: Secondary | ICD-10-CM

## 2023-08-21 DIAGNOSIS — R062 Wheezing: Secondary | ICD-10-CM

## 2023-08-21 DIAGNOSIS — Z87891 Personal history of nicotine dependence: Secondary | ICD-10-CM

## 2023-08-21 MED ORDER — PREDNISONE 10 MG PO TABS: ORAL_TABLET | ORAL | 0 refills | Status: DC

## 2023-08-21 NOTE — Progress Notes (Signed)
 Spoke w/ via phone for pre-op interview--- [name] and wife [name] needs dos----    [name]     Cross results------Current EKG in Epic dated 05/19/23 COVID test -----patient states asymptomatic no test needed Arrive at -------0630 NPO after MN NO Solid Food.  Pre-Surgery Ensure or G2:  Med rec completed Medications to take morning of surgery -----bring albuterol  inhaler. Amiodarone , metoprolol  and Trelegy Ellipta . Diabetic medication -----  GLP1 agonist last dose: GLP1 instructions:  Patient instructed no nail polish to be worn day of surgery Patient instructed to bring photo id and insurance card day of surgery Patient aware to have Driver (ride ) / caregiver    for 24 hours after surgery - Wife [name] Patient Special Instructions ----- Fleet enema per surgeons instructions. Shower with antibacterial soap.  Pre-Op special Instructions -----  Patient verbalized understanding of instructions that were given at this phone interview. Patient denies chest pain, sob, fever, cough at the interview.   Anesthesia Review:  PCP: Dr Nicolette Barrio Cardiologist : Dr Renna Cary. Cardiac clearance dated 06/24/23 [name], NP in [place].   PPM/ ICD: Device Orders: Rep Notified:  Chest x-ray : EKG :05/19/23 Echo : Stress test: Cardiac Cath :   Activity level:  Sleep Study/ CPAP : Fasting Blood Sugar :      / Checks Blood Sugar -- times a day:    Blood Thinner/ Instructions /Last Dose: Hold Xarelto  3 days prior to procedure. Pt verbalized understanding of these instructions. ASA / Instructions/ Last Dose :

## 2023-08-21 NOTE — Patient Instructions (Addendum)
 It is good to see you today. I am glad you have done well after your bronchoscopy with biopsies. You cytology was negative for malignancy, which is great news.  We will do a 3 month follow up scan to maintain surveillance. This will be due in 11/2023. You will get a call to get this scheduled closer to the time.  You will follow up with me 1-2 weeks after the scan to review results. You will get a call to get that appointment scheduled.  Delynn Fill with your Prostate / Bladder treatment. Call if you need us  sooner.  Please contact office for sooner follow up if symptoms do not improve or worsen or seek emergency care

## 2023-08-21 NOTE — Progress Notes (Signed)
 History of Present Illness [name] is a 73 y.o. male former smoker , quit 2024, followed through the Lung Cancer Screening Program. He will be followed by [name] and [name]  Synopsis [name] followed in the lung cancer screening program. A recent CT scan in May 2025 showed new lung nodules, including an 8.3 mm nodule in the left upper lobe and a stable 5.3 mm nodule in the right upper lobe. The previous scan in 2024 showed only one stable nodule. The new 8.3 mm nodule was not present in the previous year's scan.   Pt. was diagnosed with prostate cancer in January 2025, although he has had prostate cancer since 2021. His PSA levels increased from 3.9 to 4.65 over six months, prompting a PET scan that showed only prostate cancer. 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 He was scheduled to start radiation therapy in June 2025 but has been delayed due to pending marker placement. We did am FDG Avid PET scan to better evaluate the nodule. PET was read as concerning for 2 abnormal lung nodules. One in each upper lobe which are hypermetabolic and worrisome for developing malignancy. Pt. Was scheduled for bronchoscopy with biopsies on 08/13/2023 by [name]. He is here today to review the results and ensure he has done well after the procedure.    08/21/2023 Pt. Presents for  follow up after bronchoscopy with biopsies.He states he has done well, no bleeding, fever, infection, worsening dyspnea or adverse reaction to anesthesia.   We have reviewed his cytology, which was negative for malignancy. This is a relief as he has is starting treatment for  prostate and bladder cancer , and there was concern this could be metastatic disease vs a new primary. He is scheduled for simulation for radiation therapy next week.   He has no respiratory issues. We will do a 3 month follow up Ct Chest as surveillance . Pt. And his wife are in agreement with this plan.  Test  Results: Cytology 08/13/2023 A. LUNG, LUL, FINE NEEDLE ASPIRATION:  - No malignant cells identified  - Scant cellularity   B. LUNG, LUL, BRUSHING:  - No malignant cells identified  - Blood and scant cellularity   C. LUNG, LUL, LAVAGE:    FINAL MICROSCOPIC DIAGNOSIS:  - No malignant cells identified  - Bronchial cells and alveolar macrophages      Latest Ref Rng & Units 08/13/2023    8:16 AM 11/19/2022    9:05 AM 05/13/2022    9:09 AM  CBC  WBC 4.0 - 10.5 K/uL 8.4  8.1  10.2   Hemoglobin 13.0 - 17.0 g/dL 16.1  09.6  04.5   Hematocrit 39.0 - 52.0 % 48.6  46.2  44.9   Platelets 150 - 400 K/uL 270  276  313        Latest Ref Rng & Units 08/13/2023    8:16 AM 11/19/2022    9:05 AM 05/13/2022    9:09 AM  BMP  Glucose 70 - 99 mg/dL 96  94  409   BUN 8 - 23 mg/dL 14  18  19    Creatinine 0.61 - 1.24 mg/dL 8.11  9.14  7.82   BUN/Creat Ratio 10 - 24  14  16    Sodium 135 - 145 mmol/L 136  136  138   Potassium 3.5 - 5.1 mmol/L 4.7  4.5  4.4   Chloride 98 - 111 mmol/L 107  101  101   CO2 22 - 32 mmol/L 23  24  22    Calcium  8.9 - 10.3 mg/dL 9.4  9.8  9.4     BNP    Component Value Date/Time   BNP 322.6 (H) 03/04/2016 2336    ProBNP No results found for: "PROBNP"  PFT No results found for: "FEV1PRE", "FEV1POST", "FVCPRE", "FVCPOST", "TLC", "DLCOUNC", "PREFEV1FVCRT", "PSTFEV1FVCRT"  DG Chest Port 1 View Result Date: 08/13/2023 CLINICAL DATA:  Status post bronchoscopy EXAM: PORTABLE CHEST 1 VIEW COMPARISON:  CT 08/12/2023 FINDINGS: Heart and mediastinal contours are within normal limits. There is hyperinflation of the lungs compatible with COPD. No acute confluent airspace opacities or effusions. No pneumothorax. No acute bony abnormality. IMPRESSION: COPD.  No active disease. Electronically Signed   By: [name] M.D.   On: 08/13/2023 13:45   DG C-ARM BRONCHOSCOPY Result Date: 08/13/2023 C-ARM BRONCHOSCOPY: Fluoroscopy was utilized by the requesting physician.  No radiographic  interpretation.   NM PET Image Initial (PI) Skull Base To Thigh Result Date: 08/04/2023 CLINICAL DATA:  Initial treatment strategy for lung nodules. History of prostate cancer. EXAM: NUCLEAR MEDICINE PET SKULL BASE TO THIGH TECHNIQUE: 9.44 mCi F-18 FDG was injected intravenously. Full-ring PET imaging was performed from the skull base to thigh after the radiotracer. CT data was obtained and used for attenuation correction and anatomic localization. Fasting blood glucose: 65 mg/dl COMPARISON:  Noncontrast chest CT 06/30/2023. PSMA PET 03/31/2023. Older exams as well FINDINGS: Mediastinal blood pool activity: SUV max 2.1 Liver activity: SUV max 3.0 NECK: No specific abnormal uptake seen in the neck including along lymph node change of the submandibular, posterior triangle or internal jugular regions. Near symmetric uptake of the visualized intracranial compartment. Incidental CT findings: Visualized paranasal sinuses and mastoid air cells are clear. The parotid glands, submandibular glands are unremarkable. Small thyroid  gland. Scattered vascular calcifications. CHEST: No abnormal uptake above mediastinal blood pool in the axillary regions, hilum or mediastinum. However there are some areas of abnormal uptake along lung nodules. Left upper lobe nodule has maximum SUV value of 6.9 and lesion on image 60 of the CT scan, series 6 measures 9 mm. On the recent chest CT this measured 8 mm. On the remote PET-CT this measured only 4 mm. The right central right upper lobe nodule just posterior to the right upper lobe hilum also has abnormal uptake with maximum SUV of 3.8. Lesion on recent CT scan measured 5 mm. This is seen again on current study image 61. Lesion not clearly seen on the previous study of January 2025. There is a small nodule in the middle lobe on image 79 which has slight uptake of maximum SUV of 0.7. This lesion is unchanged from previous examination going back to 2021 and is likely a benign nodule. No  additional areas of abnormal lung uptake. There is some uptake along posterior right paraspinal musculature, likely physiologic. Incidental CT findings: Emphysematous lung changes identified. No pleural effusion or pneumothorax. No consolidation. Heart is nonenlarged. No pericardial effusion. Thoracic aorta has a normal course and caliber with diffuse calcified plaque. Coronary artery calcifications are seen. There is some enlargement of the pulmonary arteries. Please correlate for evidence of pulmonary artery hypertension. ABDOMEN/PELVIS: Physiologic distribution radiotracer along the parenchymal organs, bowel and renal collecting systems. Previous uptake along left side of the prostate is not seen on this current examination. Incidental CT findings: Scattered colonic stool identified. Left-sided colonic diverticula. Normal appendix. Stomach and small bowel are nondilated. Grossly the liver, spleen, adrenal  glands and pancreas unremarkable. Lower pole left-sided renal cyst identified. No renal or ureteral stones. Prominent vascular calcifications. Please correlate for a TURP defect. SKELETON: No abnormal uptake along the visualized the skeleton. Incidental CT findings: Scattered degenerative changes. IMPRESSION: As seen on the recent chest CT there are 2 abnormal lung nodules. One in each upper lobe which are hypermetabolic and worrisome for developing malignancy. Possibilities would include separate primary lesions versus metastatic lesions. Please correlate with clinical history. No additional areas of abnormal radiotracer uptake at this time. Electronically Signed   By: Adrianna Horde M.D.   On: 08/04/2023 16:17     Past medical hx Past Medical History:  Diagnosis Date   Adenomatous colon polyp    Alcohol use    Antiphospholipid antibody positive    ???In the past???but not proven according to patient   Antiphospholipid antibody positive    ??? in the past, but not proven according to the patient.    Atrial flutter (HCC)    atrial flutter ablation,   [facility] October 18, 2010   BPH (benign prostatic hypertrophy)    CAD (coronary artery disease)    Catheterization 2005 disease / catheterization July 06, 2010, chronic total occlusion distal RCA with collaterals from right and left side.  Medical therapy recommended, mild decreased LV function        Carotid artery disease (HCC)    0-39% R. ICA, 40-59% LICA... Doppler.... January, 2010  /  Doppler... in January, 2011 no change  /  doppler1/27/2012...stable  0-39% RICA 40-59% LICA   CHF (congestive heart failure) (HCC)    COPD (chronic obstructive pulmonary disease) (HCC)    CRAO (central retinal artery occlusion)    Ejection fraction     EF 50-55%, echo, March, 2012... inferobasal and distal septal hypokinesis /      60%, echo, November, 2008   Emphysema of lung [facility])    GERD (gastroesophageal reflux disease)    Hypercholesterolemia    Hypertension    Myocardial infarction (HCC)    Nonsustained ventricular tachycardia (HCC)    Palpitations    probable SVT, rate 170,, at home,, lasted one hour,, 2012   Prostate cancer (HCC)    Raynaud's syndrome    Retinal artery occlusion    Stroke (HCC)    mild stroke per pt    TIA (transient ischemic attack)    Possible small vessel tia in the past.   Tobacco abuse      Social History   Tobacco Use   Smoking status: Former    Current packs/day: 0.00    Average packs/day: 0.5 packs/day for 54.0 years (27.0 ttl pk-yrs)    Types: Cigarettes    Start date: 53    Quit date: 03/17/2022    Years since quitting: 1.4   Smokeless tobacco: Never   Tobacco comments:    11/30/19 .5-1pack per day  Vaping Use   Vaping status: Never Used  Substance Use Topics   Alcohol use: Yes    Alcohol/week: 14.0 - 21.0 standard drinks of alcohol    Types: 14 - 21 Cans of beer per week   Drug use: No    DennisFlud reports that he quit smoking about 17 months ago. His smoking use included cigarettes. He started  smoking about 55 years ago. He has a 27 pack-year smoking history. He has never used smokeless tobacco. He reports current alcohol use of about 14.0 - 21.0 standard drinks of alcohol per week. He reports that he does not  use drugs.  Tobacco Cessation: Counseling given: Not Answered Tobacco comments: 11/30/19 .5-1pack per day Former smoker  Past surgical hx, Family hx, Social hx all reviewed.  Current Outpatient Medications on File Prior to Visit  Medication Sig   albuterol  (VENTOLIN  HFA) 108 (90 Base) MCG/ACT inhaler INHALE TWO PUFFS BY MOUTH INTO LUNGS every FOUR hours AS NEEDED FOR WHEEZING AND/OR SHORTNESS OF BREATH   albuterol  (VENTOLIN  HFA) 108 (90 Base) MCG/ACT inhaler Inhale 2 puffs into the lungs every 6 (six) hours as needed for wheezing or shortness of breath.   amiodarone  (PACERONE ) 200 MG tablet TAKE 1/2 TABLET BY MOUTH ONCE DAILY   atorvastatin  (LIPITOR) 40 MG tablet Take one tablet by mouth once daily   b complex vitamins capsule Take 1 capsule by mouth daily.   Fluticasone-Umeclidin-Vilant (TRELEGY ELLIPTA ) 100-62.5-25 MCG/ACT AEPB Inhale 1 puff into the lungs daily.   lisinopril  (ZESTRIL ) 5 MG tablet Take 1 tablet (5 mg total) by mouth daily.   metoprolol  succinate (TOPROL -XL) 25 MG 24 hr tablet Take 1 tablet (25 mg total) by mouth daily.   Multiple Vitamin (MULTIVITAMIN WITH MINERALS) TABS tablet Take 1 tablet by mouth daily.   rivaroxaban  (XARELTO ) 20 MG TABS tablet Take 1 tablet (20 mg total) by mouth daily.   No current facility-administered medications on file prior to visit.     Allergies  Allergen Reactions   Cialis [Tadalafil] Other (See Comments)    History of stroke    Levitra [Vardenafil] Other (See Comments)    History of stroke    Viagra [Sildenafil Citrate] Other (See Comments)    History of stroke     Review Of Systems:  Constitutional:   No  weight loss, night sweats,  Fevers, chills, fatigue, or  lassitude.  HEENT:   No headaches,  Difficulty  swallowing,  Tooth/dental problems, or  Sore throat,                No sneezing, itching, ear ache, nasal congestion, post nasal drip,   CV:  No chest pain,  Orthopnea, PND, swelling in lower extremities, anasarca, dizziness, palpitations, syncope.   GI  No heartburn, indigestion, abdominal pain, nausea, vomiting, diarrhea, change in bowel habits, loss of appetite, bloody stools.   Resp: No shortness of breath with exertion or at rest.  No excess mucus, no productive cough,  No non-productive cough,  No coughing up of blood.  No change in color of mucus.  No wheezing.  No chest wall deformity  Skin: no rash or lesions.  GU: no dysuria, change in color of urine, no urgency or frequency.  No flank pain, no hematuria   MS:  No joint pain or swelling.  No decreased range of motion.  No back pain.  Psych:  No change in mood or affect. No depression or anxiety.  No memory loss.   Vital Signs BP (!) 147/72 (BP Location: Left Arm, Patient Position: Sitting, Cuff Size: Normal)   Pulse (!) 54   Ht 6\' 2"  (1.88 m)   Wt 168 lb 9.6 oz (76.5 kg)   SpO2 99%   BMI 21.65 kg/m    Physical Exam:  General- No distress,  A&Ox3, pleasant ENT: No sinus tenderness, TM clear, pale nasal mucosa, no oral exudate,no post nasal drip, no LAN Cardiac: S1, S2, regular rate and rhythm, no murmur Chest: No wheeze/ rales/ dullness; no accessory muscle use, no nasal flaring, no sternal retractions, clear BS through the bases, intermittent wheeze Abd.: Soft Non-tender, ND, BS +.  Body mass index is 21.65 kg/m.  Ext: No clubbing cyanosis, edema Neuro:  normal strength, MAE x 4, A&O x 3 Skin: No rashes, warm and dry, no lesions  Psych: normal mood and behavior   Assessment/Plan Lung nodules Post bronchoscopy with biopsies Former smoker  Bladder and prostate cancer Plan I am glad you have done well after your bronchoscopy with biopsies. You cytology was negative for malignancy, which is great news.  We will  do a 3 month follow up scan to maintain surveillance. This will be due in 11/2023. You will get a call to get this scheduled closer to the time.  You will follow up with me 1-2 weeks after the scan to review results. You will get a call to get that appointment scheduled.  Delynn Fill with your Prostate / Bladder treatment. Call if you need us  sooner.  Please contact office for sooner follow up if symptoms do not improve or worsen or seek emergency care   I spent 25 minutes dedicated to the care of this patient on the date of this encounter to include pre-visit review of records, face-to-face time with the patient discussing conditions above, post visit ordering of testing, clinical documentation with the electronic health record, making appropriate referrals as documented, and communicating necessary information to the patient's healthcare team.    Raejean Bullock, NP 08/21/2023  11:33 AM

## 2023-08-23 ENCOUNTER — Ambulatory Visit: Payer: Self-pay | Admitting: Pulmonary Disease

## 2023-08-25 NOTE — Telephone Encounter (Signed)
 [name] saw the patient on 08/21/23 and CXL Dr. Azucena Bollard CT order. I have spoke with the patient and his CT has been scheduled at [facility] on 11/13/23 @ 9:30am [name] wanted to see him 1 week after CT but Dr. Lucina Sabal wanted a follow up appt

## 2023-08-28 ENCOUNTER — Encounter (HOSPITAL_COMMUNITY)
Admission: RE | Disposition: A | Discharge status: Home / Self Care | Payer: Self-pay | Source: Home / Self Care | Attending: Urology

## 2023-08-28 ENCOUNTER — Ambulatory Visit (HOSPITAL_COMMUNITY)
Admission: RE | Admit: 2023-08-28 | Discharge: 2023-08-28 | Disposition: A | Discharge status: Home / Self Care | Attending: Urology | Admitting: Urology

## 2023-08-28 ENCOUNTER — Telehealth: Payer: Self-pay | Admitting: *Deleted

## 2023-08-28 ENCOUNTER — Ambulatory Visit (HOSPITAL_COMMUNITY): Admitting: Anesthesiology

## 2023-08-28 ENCOUNTER — Encounter (HOSPITAL_COMMUNITY): Payer: Self-pay | Admitting: Urology

## 2023-08-28 ENCOUNTER — Other Ambulatory Visit: Payer: Self-pay

## 2023-08-28 DIAGNOSIS — I10 Essential (primary) hypertension: Secondary | ICD-10-CM | POA: Insufficient documentation

## 2023-08-28 DIAGNOSIS — R351 Nocturia: Secondary | ICD-10-CM | POA: Diagnosis not present

## 2023-08-28 DIAGNOSIS — I2510 Atherosclerotic heart disease of native coronary artery without angina pectoris: Secondary | ICD-10-CM | POA: Diagnosis not present

## 2023-08-28 DIAGNOSIS — J449 Chronic obstructive pulmonary disease, unspecified: Secondary | ICD-10-CM

## 2023-08-28 DIAGNOSIS — Z8673 Personal history of transient ischemic attack (TIA), and cerebral infarction without residual deficits: Secondary | ICD-10-CM | POA: Diagnosis not present

## 2023-08-28 DIAGNOSIS — M1990 Unspecified osteoarthritis, unspecified site: Secondary | ICD-10-CM | POA: Insufficient documentation

## 2023-08-28 DIAGNOSIS — K219 Gastro-esophageal reflux disease without esophagitis: Secondary | ICD-10-CM | POA: Insufficient documentation

## 2023-08-28 DIAGNOSIS — N403 Nodular prostate with lower urinary tract symptoms: Secondary | ICD-10-CM | POA: Diagnosis not present

## 2023-08-28 DIAGNOSIS — Z87891 Personal history of nicotine dependence: Secondary | ICD-10-CM | POA: Insufficient documentation

## 2023-08-28 DIAGNOSIS — I5022 Chronic systolic (congestive) heart failure: Secondary | ICD-10-CM | POA: Diagnosis not present

## 2023-08-28 DIAGNOSIS — Z7901 Long term (current) use of anticoagulants: Secondary | ICD-10-CM | POA: Insufficient documentation

## 2023-08-28 DIAGNOSIS — I4891 Unspecified atrial fibrillation: Secondary | ICD-10-CM | POA: Insufficient documentation

## 2023-08-28 DIAGNOSIS — I110 Hypertensive heart disease with heart failure: Secondary | ICD-10-CM | POA: Diagnosis not present

## 2023-08-28 DIAGNOSIS — C61 Malignant neoplasm of prostate: Secondary | ICD-10-CM

## 2023-08-28 DIAGNOSIS — R972 Elevated prostate specific antigen [PSA]: Secondary | ICD-10-CM | POA: Diagnosis not present

## 2023-08-28 SURGERY — INSERTION, GOLD SEEDS
Anesthesia: Monitor Anesthesia Care | Site: Prostate

## 2023-08-28 MED ORDER — ACETAMINOPHEN 500 MG PO TABS
ORAL_TABLET | ORAL | Status: AC
  Administered 2023-08-28: 1000 mg via ORAL
  Filled 2023-08-28: qty 2

## 2023-08-28 MED ORDER — ACETAMINOPHEN 500 MG PO TABS: 1000.0000 mg | ORAL_TABLET | Freq: Once | ORAL | Status: AC

## 2023-08-28 MED ORDER — FENTANYL CITRATE (PF) 100 MCG/2ML IJ SOLN: 25.0000 ug | INTRAMUSCULAR | Status: DC | PRN

## 2023-08-28 MED ORDER — SODIUM CHLORIDE (PF) 0.9 % IJ SOLN
INTRAMUSCULAR | Status: DC | PRN
  Administered 2023-08-28: 10 mL via INTRAVENOUS

## 2023-08-28 MED ORDER — CHLORHEXIDINE GLUCONATE 0.12 % MT SOLN: 15.0000 mL | Freq: Once | OROMUCOSAL | Status: DC

## 2023-08-28 MED ORDER — ORAL CARE MOUTH RINSE: 15.0000 mL | Freq: Once | OROMUCOSAL | Status: DC

## 2023-08-28 MED ORDER — CHLORHEXIDINE GLUCONATE 0.12 % MT SOLN
OROMUCOSAL | Status: AC
  Filled 2023-08-28: qty 15

## 2023-08-28 MED ORDER — ONDANSETRON HCL 4 MG/2ML IJ SOLN
4.0000 mg | Freq: Once | INTRAMUSCULAR | Status: DC | PRN
Start: 2023-08-28 — End: 2023-08-28

## 2023-08-28 MED ORDER — LIDOCAINE 2% (20 MG/ML) 5 ML SYRINGE
INTRAMUSCULAR | Status: AC
  Filled 2023-08-28: qty 5

## 2023-08-28 MED ORDER — CEFAZOLIN SODIUM-DEXTROSE 2-4 GM/100ML-% IV SOLN
2.0000 g | INTRAVENOUS | Status: AC
  Administered 2023-08-28: 2 g via INTRAVENOUS

## 2023-08-28 MED ORDER — PROPOFOL 10 MG/ML IV BOLUS
INTRAVENOUS | Status: DC | PRN
  Administered 2023-08-28: 150 mg via INTRAVENOUS
  Administered 2023-08-28: 100 mg via INTRAVENOUS

## 2023-08-28 MED ORDER — LACTATED RINGERS IV SOLN: INTRAVENOUS | Status: DC

## 2023-08-28 MED ORDER — CEFAZOLIN SODIUM-DEXTROSE 2-4 GM/100ML-% IV SOLN
INTRAVENOUS | Status: AC
  Filled 2023-08-28: qty 100

## 2023-08-28 MED ORDER — FLEET ENEMA RE ENEM
1.0000 | ENEMA | Freq: Once | RECTAL | Status: DC
  Filled 2023-08-28: qty 1

## 2023-08-28 MED ORDER — PROPOFOL 10 MG/ML IV BOLUS
INTRAVENOUS | Status: AC
  Filled 2023-08-28: qty 20

## 2023-08-28 MED ORDER — LIDOCAINE HCL 1 % IJ SOLN
INTRAMUSCULAR | Status: DC | PRN
  Administered 2023-08-28: 10 mL

## 2023-08-28 SURGICAL SUPPLY — 21 items
BLADE CLIPPER SENSICLIP SURGIC (BLADE) ×2 IMPLANT
CNTNR URN SCR LID CUP LEK RST (MISCELLANEOUS) ×2 IMPLANT
COVER BACK TABLE 60X90IN (DRAPES) ×2 IMPLANT
DRSG TEGADERM 4X4.75 (GAUZE/BANDAGES/DRESSINGS) ×2 IMPLANT
DRSG TEGADERM 8X12 (GAUZE/BANDAGES/DRESSINGS) ×2 IMPLANT
GAUZE SPONGE 4X4 12PLY STRL (GAUZE/BANDAGES/DRESSINGS) ×2 IMPLANT
GLOVE BIO SURGEON STRL SZ7.5 (GLOVE) ×2 IMPLANT
GLOVE SURG ORTHO 8.5 STRL (GLOVE) ×2 IMPLANT
IMPL SPACEOAR SYSTEM 10ML (Spacer) ×2 IMPLANT
MARKER GOLD PRELOAD 1.2X3 (Urological Implant) ×2 IMPLANT
MARKER SKIN DUAL TIP RULER LAB (MISCELLANEOUS) ×2 IMPLANT
NDL SPNL 22GX3.5 QUINCKE BK (NEEDLE) ×2 IMPLANT
NEEDLE SPNL 22GX3.5 QUINCKE BK (NEEDLE) ×1 IMPLANT
SHEATH ULTRASOUND LF (SHEATH) IMPLANT
SHEATH ULTRASOUND LTX NONSTRL (SHEATH) IMPLANT
SLEEVE SCD COMPRESS KNEE MED (STOCKING) ×2 IMPLANT
SURGILUBE 2OZ TUBE FLIPTOP (MISCELLANEOUS) ×2 IMPLANT
SYR 10ML LL (SYRINGE) ×2 IMPLANT
SYR CONTROL 10ML LL (SYRINGE) ×2 IMPLANT
TOWEL OR 17X24 6PK STRL BLUE (TOWEL DISPOSABLE) ×2 IMPLANT
UNDERPAD 30X36 HEAVY ABSORB (UNDERPADS AND DIAPERS) ×2 IMPLANT

## 2023-08-28 NOTE — Op Note (Signed)
 Preoperative diagnosis: Clinically localized adenocarcinoma of the prostate   Postoperative diagnosis: Clinically localized adenocarcinoma of the prostate  Procedure: 1) Placement of fiducial markers into prostate                    2) Insertion of SpaceOAR hydrogel   Surgeon: Salli Crawley, M.D.  Anesthesia: General  EBL: Minimal  Complications: None  Indication: [name] is a 73 y.o. gentleman with clinically localized prostate cancer. After discussing management options for treatment, he elected to proceed with radiotherapy. He presents today for the above procedures. The potential risks, complications, alternative options, and expected recovery course have been discussed in detail with the patient and he has provided informed consent to proceed.  Description of procedure: The patient was administered preoperative antibiotics, placed in the dorsal lithotomy position, and prepped and draped in the usual sterile fashion. Next, transrectal ultrasonography was utilized to visualize the prostate. Three gold fiducial markers were then placed into the prostate via transperineal needles under ultrasound guidance at the right apex, right base, and left mid gland under direct ultrasound guidance. A site in the midline was then selected on the perineum for placement of an 18 g needle with saline. The needle was advanced above the rectum and below Denonvillier's fascia to the mid gland and confirmed to be in the midline on transverse imaging. One cc of saline was injected confirming appropriate expansion of this space. A total of 5 cc of saline was then injected to open the space further bilaterally. The saline syringe was then removed and the SpaceOAR hydrogel was injected with good distribution bilaterally. He tolerated the procedure well and without complications. He was given a voiding trial prior to discharge from the PACU.

## 2023-08-28 NOTE — Anesthesia Preprocedure Evaluation (Addendum)
 Anesthesia Evaluation  Patient identified by MRN, date of birth, ID band Patient awake    Reviewed: Allergy & Precautions, NPO status , Patient's Chart, lab work & pertinent test results, reviewed documented beta blocker date and time   Airway Mallampati: III  TM Distance: >3 FB Neck ROM: Full    Dental no notable dental hx. (+) Poor Dentition, Dental Advisory Given, Chipped, Missing,    Pulmonary COPD,  COPD inhaler, former smoker Quit smoking 2024, 27 back year history Uses albuterol  a few times a day and trelegy in the AM, used both this AM    Pulmonary exam normal breath sounds clear to auscultation       Cardiovascular hypertension (167/85 preop, not sure where BP is normally), Pt. on medications and Pt. on home beta blockers + CAD (nonobstructive on cath 2005)  Normal cardiovascular exam+ dysrhythmias (xarelto  LD 5d) Atrial Fibrillation  Rhythm:Regular Rate:Normal  Echo 2023 1. Left ventricular ejection fraction, by estimation, is 60 to 65%. Left  ventricular ejection fraction by 3D volume is 61 %. The left ventricle has  normal function. The left ventricle has no regional wall motion  abnormalities. Left ventricular diastolic   parameters were normal.   2. Right ventricular systolic function is normal. The right ventricular  size is normal.   3. The mitral valve is normal in structure. No evidence of mitral valve  regurgitation. No evidence of mitral stenosis.   4. The aortic valve is normal in structure. Aortic valve regurgitation is  not visualized. No aortic stenosis is present.   5. The inferior vena cava is normal in size with greater than 50%  respiratory variability, suggesting right atrial pressure of 3 mmHg.     Neuro/Psych CVA, No Residual Symptoms  negative psych ROS   GI/Hepatic ,GERD  Controlled,,(+)     substance abuse (etoh abuse)  alcohol use  Endo/Other  negative endocrine ROS     Renal/GU negative Renal ROS  negative genitourinary   Musculoskeletal  (+) Arthritis , Osteoarthritis,    Abdominal   Peds  Hematology negative hematology ROS (+)   Anesthesia Other Findings   Reproductive/Obstetrics negative OB ROS                             Anesthesia Physical Anesthesia Plan  ASA: 3  Anesthesia Plan: MAC   Post-op Pain Management:    Induction:   PONV Risk Score and Plan: 2 and Propofol  infusion and TIVA  Airway Management Planned: Natural Airway and Simple Face Mask  Additional Equipment: None  Intra-op Plan:   Post-operative Plan:   Informed Consent: I have reviewed the patients History and Physical, chart, labs and discussed the procedure including the risks, benefits and alternatives for the proposed anesthesia with the patient or authorized representative who has indicated his/her understanding and acceptance.       Plan Discussed with: CRNA  Anesthesia Plan Comments:        Anesthesia Quick Evaluation

## 2023-08-28 NOTE — Anesthesia Postprocedure Evaluation (Signed)
 Anesthesia Post Note  Patient: [name]  Procedure(s) Performed: INSERTION, GOLD SEEDS (Prostate) INJECTION, HYDROGEL SPACER (Prostate)     Patient location during evaluation: PACU Anesthesia Type: MAC Level of consciousness: awake and alert Pain management: pain level controlled Vital Signs Assessment: post-procedure vital signs reviewed and stable Respiratory status: spontaneous breathing, nonlabored ventilation, respiratory function stable and patient connected to nasal cannula oxygen Cardiovascular status: stable and blood pressure returned to baseline Postop Assessment: no apparent nausea or vomiting Anesthetic complications: no   No notable events documented.  Last Vitals:  Vitals:   08/28/23 1000 08/28/23 1015  BP: (!) 151/68 (!) 164/72  Pulse: (!) 55 (!) 59  Resp: 17 15  Temp:    SpO2: 98% 99%    Last Pain:  Vitals:   08/28/23 1015  TempSrc:   PainSc: 0-No pain                 Theotis Flake P Saralyn Willison

## 2023-08-28 NOTE — H&P (Signed)
 1. 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 He has stable LUTS with an IPSS of 14 and nocturia x 2. His UA is clear. He quit smoking last year and gained 6-7 lb. He has had no bone pain. He has had no hematuria.   03/23/23: [name] returns today in f/u. His PSA continues to rise and is up to 4.63 from 3.92 at his last visit. His prostate on MRI in 10/24 was 19ml with no high risk lesions.   09/15/22: [name] returns today in f/u. His PSA continues to rise and is up to 3.92 from 3.06 at his last visit and 2.56 a year ago. His IPSS is 16 which is minimal increased.   12/27/23[name] return in f/u. He had a MRIP on 09/10/21 that showed only PIRADS2 changes. His PSA is up to 3.06 from 2.56 6 months ago. He has multiple comorbidities and is not too interested in another biopsy at this time. His IPSS is 13 with nocturia x 2. He has no bone pain or weight loss. He has had no hematuria.   08/26/21: [name] returns today in f/u. 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 He was initially diagnosed in 7/21 with T1c Nx Mx GG1 disease. His PSA prior to this visit is up to 2.56 from his nadir of 1.94 in 6/22 s/p the TURP. He is voiding well with an IPSS of 12 with some nocturia x 3 and urgency. He has had no hematuria.   02/25/21: [name] returns today in f/u. He is s/p a TURP on 2/29/22. He is doing well with an IPSS of 11. He has nocturia x 2. He finally had resolution of the terminal dysuria. He has no further hematuria. his PSA on 08/22/20 was down to 1.94 but it is up slightly to 2.21 on 02/18/21. He had small volume Gleason 7(3+4) prostate cancer on the TUR path. His prior biopsy in 7/21 demonstrated t1c Nx Mx Gleason 6 very low risk disease. He stopped about 6 months ago. He has ED but is not interested in therapy. He has no associated signs or symptoms.\     AUA Symptom Score: Less than 50% of the time he has the sensation of not emptying his bladder completely when finished urinating.  Less than 20% of the time he has to urinate again fewer than two hours after he has finished urinating. 50% of the time he has to start and stop again several times when he urinates. Less than 20% of the time he finds it difficult to postpone urination. More than 50% of the time he has a weak urinary stream. Less than 20% of the time he has to push or strain to begin urination. He has to get up to urinate 2 times from the time he goes to bed until the time he gets up in the morning.   Calculated AUA Symptom Score: 14    ALLERGIES: No Allergies    MEDICATIONS: Metoprolol  Succinate 50 mg tablet, extended release 24 hr  Amiodarone  Hcl 100 mg tablet  Atorvastatin  Calcium  40 MG Oral Tablet Oral  Folic Acid  1 mg tablet  Lisinopril  10 MG Oral Tablet Oral  Multivitamin  Vitamin B6 1 PO Daily  Xarelto  20 mg tablet     Notes: He is on a new inhaler for COPD, possible atrovent or albuterol .   GU PSH: Complex Uroflow - 2021 Cystoscopy - 2021 Cystoscopy TURP - 2022 Prostate Needle Biopsy -  2021       PSH Notes: Cardiac Catheter His Ablation, Inguinal Hernia Repair, Inguinal Hernia Repair, Thyroid  Surgery   NON-GU PSH: Knee Arthroscopy, Right - 02/14/2022 Surgical Pathology, Gross And Microscopic Examination For Prostate Needle - 2021 Visit Complexity (formerly GPC1X) - 09/15/2022     GU PMH: BPH w/LUTS, He continues to have moderate LUTS despite the prior TURP. - 09/15/2022, He has persistent moderate LUTS following a TURP., - 03/12/2022, He is doing well s/p TURP but still has moderate LUTS with nocturia x 3. , - 08/26/2021, He has stable moderate LUTS s/p TURP but no further dysuria or UUI. , - 02/25/2021, He has mild/mod residual LUTS with some dysuria but the urine is clear. I reassured him. , - 2022, PVR 2ml today, FOS has improved. He continues finasteride ., - 2022, He has persistent mod/severe LUTS despite the finasteride  and he has failed several prior meds. I discussed options and will get  him set up for a TURP. I reviewd the risks of a TURP including bleeding, infection, incontinence, stricture, need for secondary procedures, ejaculatory and erectile dysfunction, thrombotic events, fluid overload and anesthetic complications. I explained that 95% of men will have relief of the obstructive symptoms and about 70% will have relief of the irritative symptoms. He will need clearance to come off of the xarelto . , - 2022, He has BPH with BOO with a 43ml prostate US  but a short prostatic urethra without bulky hyperplasia. I discussed options including Urolift, TURP/TUIP, PTNS and finasteride . I didn't discuss Rezum because with his COPD, Nitrous is problematic. At this time I will give him a trial of finasteride  and reviewed the side effects. F/u in 3 months. , - 2021, IPSS is 22. , - 2019, Benign prostatic hyperplasia with urinary obstruction, - 2014 Elevated PSA - 09/15/2022, - 03/12/2022, - 08/26/2021, - 02/25/2021, - 2022, Down as expected. , - 2022, - 2021, His PSA has been rising over the last 9 years and was up to 3.79 in May but his exam is benign. I will repeat in 1 month after the antibiotics. , - 2019 Prostate Cancer, His PSA continues to rise with a 2 year PSADT. I discussed options and will repeat the MRIP. If there is a lesion of concern, he will need a repeat biopsy. If the MRI is negative, he will return in 6 months with a repeat PSA. - 09/15/2022, He has T1b GG2 prostate cancer with a slowly rising PSA but low risk MRI. He is not inclined to have another biopsy at this time. I will have him return in 6 months with a PSA for an exam. , - 03/12/2022, His PSA is slowly rising s/p TURP. I will get an MRI and consider the need for a surveillance biopsy. , - 08/26/2021, He is doing well but his PSA is up slightly since he stopped the finasteride  but the rise is minimal. I will recheck in 6 months. , - 02/25/2021, He had small volume GG2 disease on his TURP. The PSA has fallen nicely with the debulking  from the TURP but he just came off of the finasteride  so that might go up as a result. he will return 6 months with a PSA. , - 2022, - 2022, His PSA is falling appropriately on finasteride  and I will continue to follow the PSA's for now. , - 2022, His PSA has fallen to 4.09 and I will have him return in 3 months with a repeat on finasteride . , - 2021, He  has T1c Nx Mx Gleason 6 very low risk prostate cancer with severe ED and moderate to severe LUTS. He has several serious comorbities. I reviewed the Predict Nomogram which suggests a 2% probability of prostate cancer mortality with conservative therapy at 10 years and 1% with radical therapy. I discussed the options for management and associated risks including Active Surveillance, RALP, EXRT, Seeds, Cryo and HIFU. I feel he would be a good candidate for an initial course of active surveillance and he is in agreement. I will have him return in 4 months with a PSA. , - 2021 Weak Urinary Stream - 09/15/2022, - 2022, PF is low on flowmetry today. , - 2021, - 2021 Nocturia - 03/12/2022, - 08/26/2021, - 02/25/2021, - 2022, - 2022, - 2021, Nocturia, - 2014 Urge incontinence - 03/12/2022, - 2022 ED due to arterial insufficiency, He is not interested in therapy. - 02/25/2021, He has severe ED. , - 2021 Dysuria - 2022 Gross hematuria - 2022 Proteinuria, He has a normal Cr but if the proteinuria persists at f/u he will need further evaluation. - 2019 Polyuria, Polyuria - 2014      PMH Notes:  1898-03-17 00:00:00 - Note: Normal Routine History And Physical Adult  2012-07-22 15:31:51 - Note: Sinus Arrhythmia   NON-GU PMH: Bacteriuria, Urine culture today. - 2019 Personal history of other diseases of the circulatory system, History of cardiac disorder - 2014, History of essential hypertension, - 2014 Personal history of other endocrine, nutritional and metabolic disease, History of hypercholesterolemia - 2014 Unspecified atherosclerosis, Atherosclerosis -  2014 Unspecified atrial fibrillation, Atrial Fibrillation - 2014 Hypercholesterolemia Hypertension Transient ischemic attack    FAMILY HISTORY: 2 sons - No Family History adopted - Runs In Family Family Health Status Number - Runs In Family    Notes: PT was adopted no family history    SOCIAL HISTORY: Marital Status: Married Preferred Language: English; Race: White Current Smoking Status: Patient smokes. Smokes 1/2 pack per day.   Tobacco Use Assessment Completed: Used Tobacco in last 30 days? Drinks 3 drinks per day. Types of alcohol consumed: Beer.  Drinks 2 caffeinated drinks per day.     Notes: Tobacco use, Caffeine Use, Alcohol Use, Occupation:, Marital History - Currently Married   REVIEW OF SYSTEMS:    GU Review Male:   Patient reports get up at night to urinate and stream starts and stops. Patient denies frequent urination, hard to postpone urination, burning/ pain with urination, leakage of urine, trouble starting your stream, have to strain to urinate , erection problems, and penile pain.  Gastrointestinal (Upper):   Patient denies nausea, vomiting, and indigestion/ heartburn.  Gastrointestinal (Lower):   Patient denies diarrhea and constipation.  Constitutional:   Patient denies fever, night sweats, weight loss, and fatigue.  Skin:   Patient denies skin rash/ lesion and itching.  Eyes:   Patient denies blurred vision and double vision.  Ears/ Nose/ Throat:   Patient denies sore throat and sinus problems.  Hematologic/Lymphatic:   Patient denies swollen glands and easy bruising.  Cardiovascular:   Patient denies leg swelling and chest pains.  Respiratory:   Patient denies cough and shortness of breath.  Endocrine:   Patient denies excessive thirst.  Musculoskeletal:   Patient denies back pain and joint pain.  Neurological:   Patient denies headaches and dizziness.  Psychologic:   Patient denies depression and anxiety.   Notes: Weak stream    VITAL SIGNS: None   GU  PHYSICAL EXAMINATION:    Anus  and Perineum: No hemorrhoids. No anal stenosis. No rectal fissure, no anal fissure. No edema, no dimple, no perineal tenderness, no anal tenderness.  Prostate: Prostate 1 1/2+ size. Left base 5 mm prostate nodule. Left lobe normal consistency, right lobe normal consistency. Symmetrical lobes. Left lobe no tenderness, right lobe no tenderness.   Seminal Vesicles: Nonpalpable.  Sphincter Tone: Normal sphincter. No rectal tenderness. No rectal mass.    MULTI-SYSTEM PHYSICAL EXAMINATION:    Constitutional: Well-nourished. No physical deformities. Normally developed. Good grooming.      Complexity of Data:  Lab Test Review:   PSA  Records Review:   AUA Symptom Score, Previous Patient Records  Urine Test Review:   Urinalysis   03/16/23 09/08/22 03/05/22 08/19/21 02/18/21 08/22/20 04/24/20 01/30/20  PSA  Total PSA 4.63 ng/mL 3.92 ng/mL 3.06 ng/mL 2.56 ng/mL 2.21 ng/mL 1.94 ng/mL 2.29 ng/mL 4.09 ng/mL  Free PSA        0.52 ng/mL  % Free PSA        13 % PSA    PROCEDURES:          Visit Complexity - G2211 Chronic management         Urinalysis Dipstick Dipstick Cont'd  Color: Yellow Bilirubin: Neg mg/dL  Appearance: Clear Ketones: Neg mg/dL  Specific Gravity: 1.610 Blood: Neg ery/uL  pH: 5.5 Protein: Trace mg/dL  Glucose: Neg mg/dL Urobilinogen: 0.2 mg/dL    Nitrites: Neg    Leukocyte Esterase: Neg leu/uL    ASSESSMENT:      ICD-10 Details  1 GU:   Prostate Cancer - C61 Chronic, Worsening - He has T1b GG2 cancer in a small prostate with a PSADT of about 33mo. He is reluctant to have a biopsy. I will get a PSMA PET to better get a sense of the extent of the cancer.   2   Elevated PSA - R97.20 Chronic, Worsening  3   Prostate nodule w/ LUTS - N40.3 Undiagnosed New Problem - He has a small left base nodule. I will assess that with the PET.   4   Weak Urinary Stream - R39.12 Chronic, Stable   PLAN:           Orders X-Rays: PET- PSMA Scan - He has  prostate cancer with a rising PSA and is reluctant to have another biopsy but with the rate of rise and his small prostate I need to get him staged.           Schedule Labs: 6 Months - Urinalysis    6 Months - PSA  Return Visit/Planned Activity: 6 Months - Office Visit

## 2023-08-28 NOTE — Discharge Instructions (Addendum)
 For several days the patient:   should increase his fluid intake and limit strenuous activity. he might have mild discomfort at the base of his penis or in his rectum. he might have blood in his urine or blood in his bowel movements.   For 2-3 months he might have blood in his ejaculate (semen).   Call the office immedicately:  for blood clots in the urine or bowel movements,  difficulty urinating,  inability to urinate,  urinary retention,  painful or frequent urination,  fever, chills,  nausea, vomiting, other illness.      [facility]:  [phone]            No acetaminophen /Tylenol  until after 12:45 pm today if needed.     Post Anesthesia Home Care Instructions  Activity: Get plenty of rest for the remainder of the day. A responsible individual must stay with you for 24 hours following the procedure.  For the next 24 hours, DO NOT: -Drive a car -Advertising copywriter -Drink alcoholic beverages -Take any medication unless instructed by your physician -Make any legal decisions or sign important papers.  Meals: Start with liquid foods such as gelatin or soup. Progress to regular foods as tolerated. Avoid greasy, spicy, heavy foods. If nausea and/or vomiting occur, drink only clear liquids until the nausea and/or vomiting subsides. Call your physician if vomiting continues.  Special Instructions/Symptoms: Your throat may feel dry or sore from the anesthesia or the breathing tube placed in your throat during surgery. If this causes discomfort, gargle with warm salt water. The discomfort should disappear within 24 hours.

## 2023-08-28 NOTE — Telephone Encounter (Signed)
 CALLED PATIENT TO REMIND OF SIM AND MRI FOR 09-01-23, SPOKE WITH PATIENT AND HE IS AWARE OF THESE APPTS. AND THE INSTRUCTIONS

## 2023-08-28 NOTE — Transfer of Care (Signed)
 Immediate Anesthesia Transfer of Care Note  Patient: [name]  Procedure(s) Performed: INSERTION, GOLD SEEDS (Prostate) INJECTION, HYDROGEL SPACER (Prostate)  Patient Location: PACU  Anesthesia Type:MAC  Level of Consciousness: awake, alert , oriented, and patient cooperative  Airway & Oxygen Therapy: Patient Spontanous Breathing  Post-op Assessment: Report given to RN, Post -op Vital signs reviewed and stable, Patient moving all extremities X 4, and Patient able to stick tongue midline  Post vital signs: Reviewed and stable  Last Vitals:  Vitals Value Taken Time  BP 103/65   Temp 97.7   Pulse 64 08/28/23 09:14  Resp 19   SpO2 97 % 08/28/23 09:14  Vitals shown include unfiled device data.  Last Pain:  Vitals:   08/28/23 0656  TempSrc: Oral  PainSc: 0-No pain         Complications: No notable events documented.

## 2023-08-31 ENCOUNTER — Encounter (HOSPITAL_COMMUNITY): Payer: Self-pay | Admitting: Urology

## 2023-09-01 ENCOUNTER — Ambulatory Visit (HOSPITAL_COMMUNITY)

## 2023-09-01 ENCOUNTER — Ambulatory Visit (HOSPITAL_COMMUNITY)
Admission: RE | Admit: 2023-09-01 | Discharge: 2023-09-01 | Disposition: A | Discharge status: Home / Self Care | Source: Ambulatory Visit | Attending: Urology | Admitting: Urology

## 2023-09-01 ENCOUNTER — Ambulatory Visit
Admission: RE | Admit: 2023-09-01 | Discharge: 2023-09-01 | Disposition: A | Discharge status: Home / Self Care | Source: Ambulatory Visit | Attending: Urology | Admitting: Urology

## 2023-09-01 DIAGNOSIS — Z191 Hormone sensitive malignancy status: Secondary | ICD-10-CM | POA: Diagnosis not present

## 2023-09-01 DIAGNOSIS — Z510 Encounter for antineoplastic radiation therapy: Secondary | ICD-10-CM | POA: Diagnosis not present

## 2023-09-01 DIAGNOSIS — C61 Malignant neoplasm of prostate: Secondary | ICD-10-CM | POA: Insufficient documentation

## 2023-09-01 NOTE — Telephone Encounter (Addendum)
 Per secure chat with Dr. Lucina Sabal, he wants to see the patient after his CT.  I spoke with the patient and scheduled him an appt for 9/3 at 10:00am.  Nothing further needed.

## 2023-09-01 NOTE — Progress Notes (Signed)
  Radiation Oncology         [phone]) [phone] ________________________________  Name: ALMIN LIVINGSTONE MRN: [id number]  Date: 09/01/2023  DOB: 02-25-1951  SIMULATION AND TREATMENT PLANNING NOTE    ICD-10-CM   1. Prostatic adenocarcinoma (HCC)  C61       DIAGNOSIS:   73 y.o. gentleman with stage T1c adenocarcinoma of the prostate with a Gleason's score of 3+4 and a PSA of 4.63  NARRATIVE:  The patient was brought to the CT Simulation planning suite.  Identity was confirmed.  All relevant records and images related to the planned course of therapy were reviewed.  The patient freely provided informed written consent to proceed with treatment after reviewing the details related to the planned course of therapy. The consent form was witnessed and verified by the simulation staff.  Then, the patient was set-up in a stable reproducible supine position for radiation therapy.  A vacuum lock pillow device was custom fabricated to position his legs in a reproducible immobilized position.  Then, supervised the performance of a urethrogram under sterile conditions to identify the prostatic apex.  CT images were obtained.  Surface markings were placed.  The CT images were loaded into the planning software.  Then the prostate target and avoidance structures including the rectum, bladder, bowel and hips were contoured.  Treatment planning then occurred.  The radiation prescription was entered and confirmed.  A total of one complex treatment devices was fabricated. I have requested : Intensity Modulated Radiotherapy (IMRT) is medically necessary for this case for the following reason:  Rectal sparing.  I have requested daily cone beam CT volumetric image gudiance to track gold fiducial posiitoning along with bladder and rectal filling, this is medically necessary to assure accurate positioning of high dose radiation.  PLAN:  The patient will receive 70 Gy in 28 fractions.  ________________________________  Trilby Fujisawa  Lorri Rota, M.D.

## 2023-09-02 DIAGNOSIS — Z510 Encounter for antineoplastic radiation therapy: Secondary | ICD-10-CM | POA: Diagnosis not present

## 2023-09-02 DIAGNOSIS — Z191 Hormone sensitive malignancy status: Secondary | ICD-10-CM | POA: Diagnosis not present

## 2023-09-02 DIAGNOSIS — C61 Malignant neoplasm of prostate: Secondary | ICD-10-CM | POA: Diagnosis not present

## 2023-09-10 ENCOUNTER — Ambulatory Visit
Admission: RE | Admit: 2023-09-10 | Discharge: 2023-09-10 | Disposition: A | Discharge status: Home / Self Care | Source: Ambulatory Visit | Attending: Radiation Oncology | Admitting: Radiation Oncology

## 2023-09-10 ENCOUNTER — Other Ambulatory Visit: Payer: Self-pay

## 2023-09-10 DIAGNOSIS — Z191 Hormone sensitive malignancy status: Secondary | ICD-10-CM | POA: Diagnosis not present

## 2023-09-10 DIAGNOSIS — Z510 Encounter for antineoplastic radiation therapy: Secondary | ICD-10-CM | POA: Diagnosis not present

## 2023-09-10 DIAGNOSIS — C61 Malignant neoplasm of prostate: Secondary | ICD-10-CM | POA: Diagnosis not present

## 2023-09-10 LAB — RAD ONC ARIA SESSION SUMMARY
Course Elapsed Days: 0
Plan Fractions Treated to Date: 1
Plan Prescribed Dose Per Fraction: 2.5 Gy
Plan Total Fractions Prescribed: 28
Plan Total Prescribed Dose: 70 Gy
Reference Point Dosage Given to Date: 2.5 Gy
Reference Point Session Dosage Given: 2.5 Gy
Session Number: 1

## 2023-09-11 ENCOUNTER — Ambulatory Visit

## 2023-09-13 IMAGING — CT CT CHEST LUNG CANCER SCREENING LOW DOSE W/O CM
2 of 4 series · 15 of 36 positions shown, 18 images · non-contrast
Comparison: 01/27/2020 screening chest CT.

CLINICAL DATA: 70-year-old asymptomatic male current smoker with 52
pack-year smoking history.

EXAM:
CT CHEST WITHOUT CONTRAST LOW-DOSE FOR LUNG CANCER SCREENING
TECHNIQUE: Multidetector CT imaging of the chest was performed following the
standard protocol without IV contrast.

[Series 3: lung thins 1.0 · axial · 0.69mm/px · z∈[-359,-39]mm · 12 of 354 slices shown, 15 images]
[im 17/354  mediastinal]
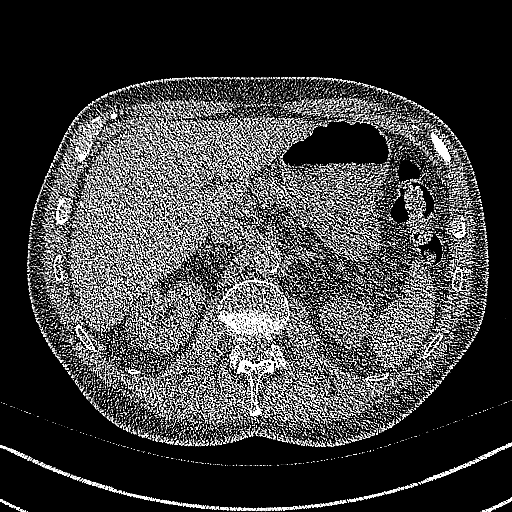
[im 17/354  lung]
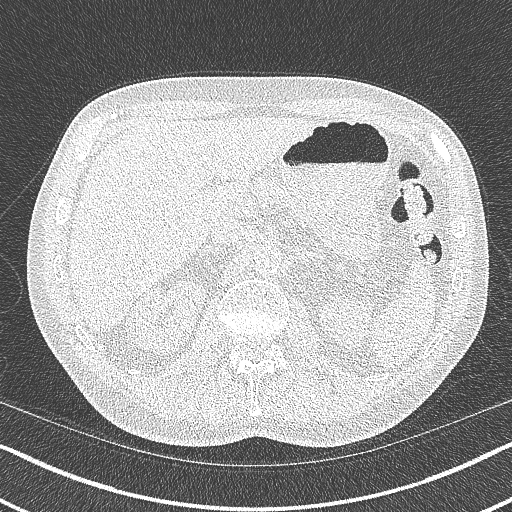
[im 49/354  lung]
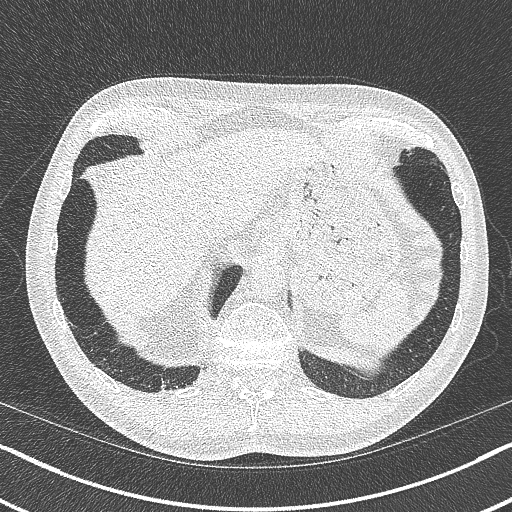
[im 81/354  lung]
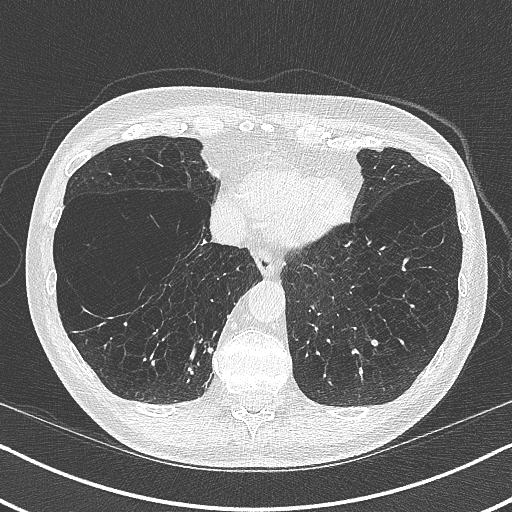
[im 113/354  lung]
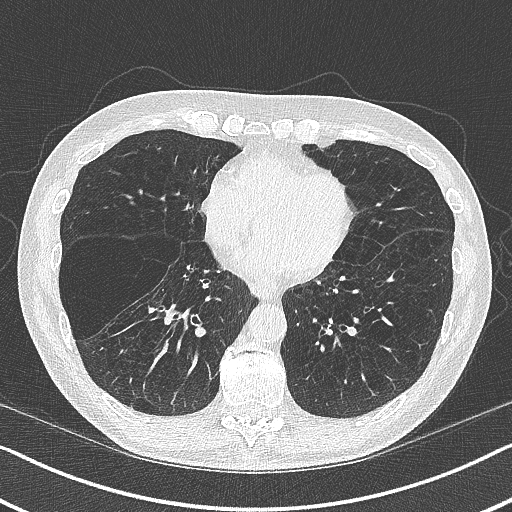
[im 129/354  mediastinal]
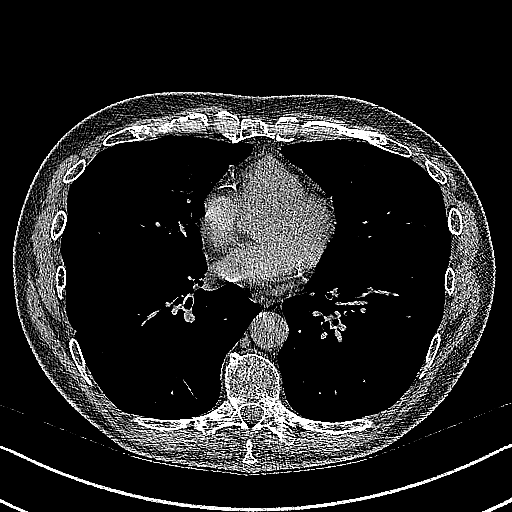
[im 129/354  lung]
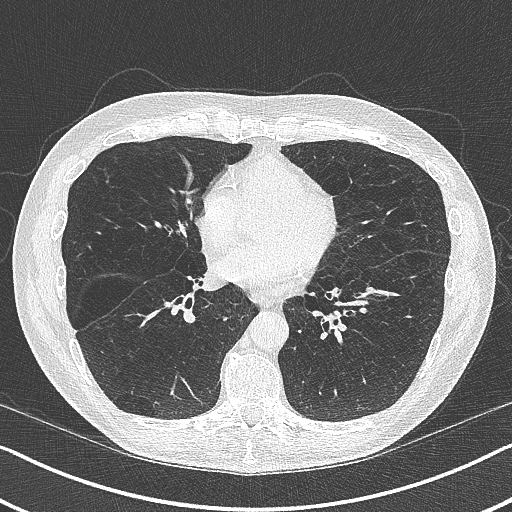
[im 161/354  lung]
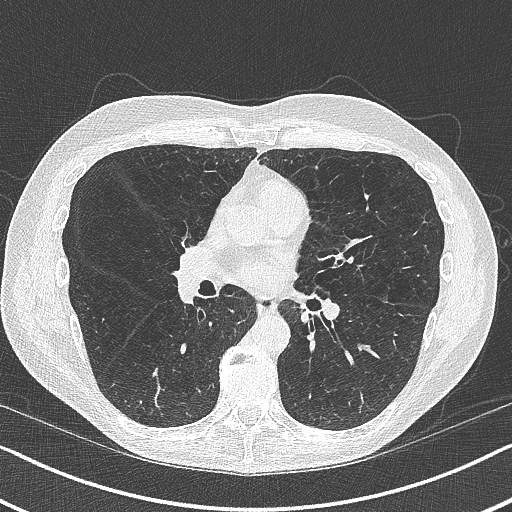
[im 193/354  lung]
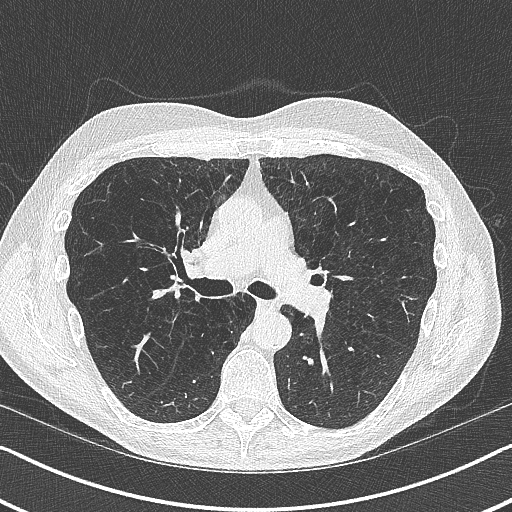
[im 225/354  lung]
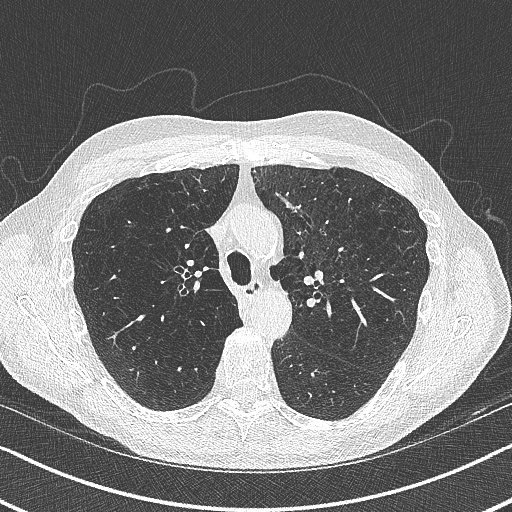
[im 241/354  mediastinal]
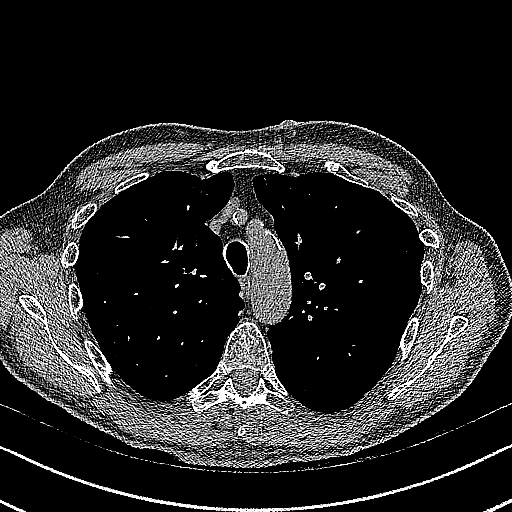
[im 241/354  lung]
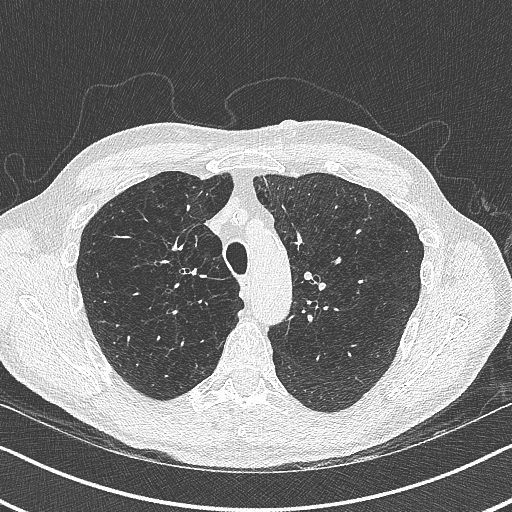
[im 273/354  lung]
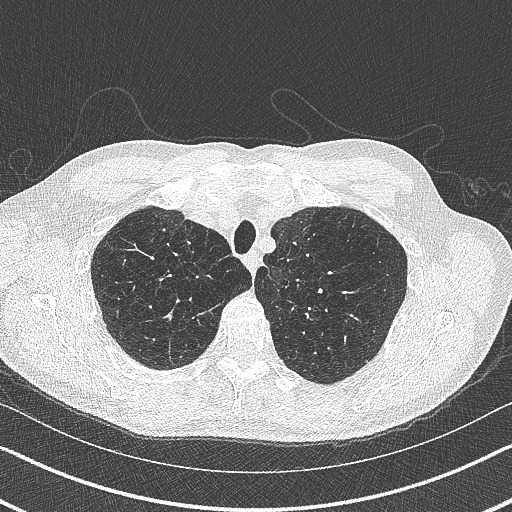
[im 305/354  lung]
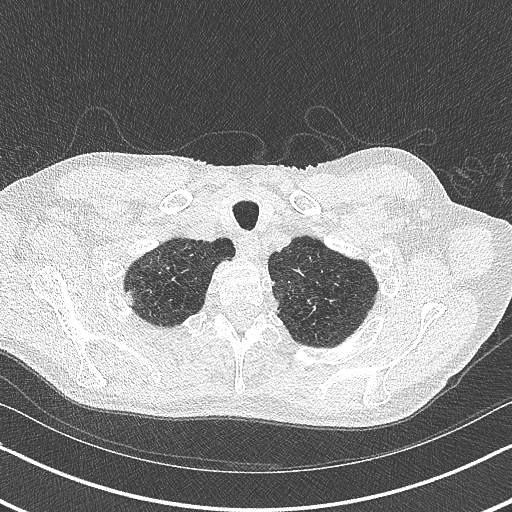
[im 337/354  lung]
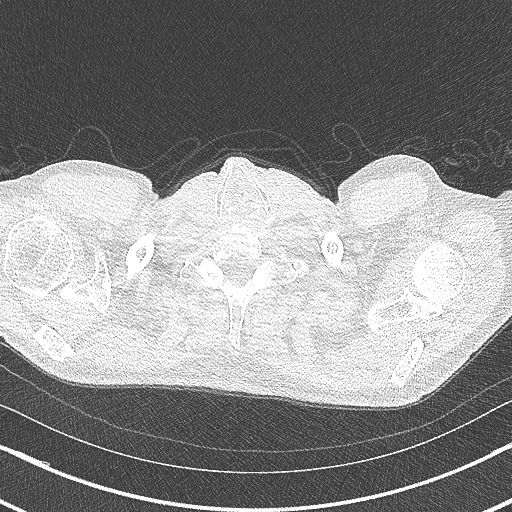

[Series 5: coronal · coronal · 0.66mm/px · 3 of 129 slices shown]
[im 26/129  lung]
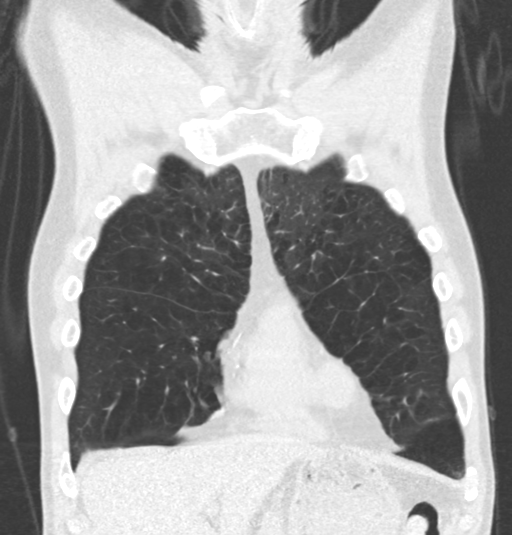
[im 52/129  lung]
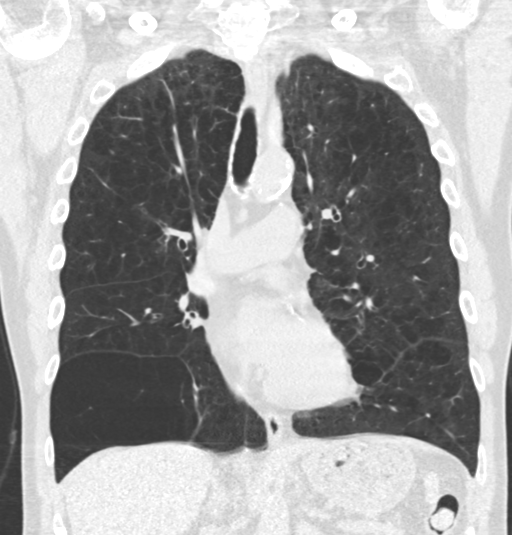
[im 77/129  lung]
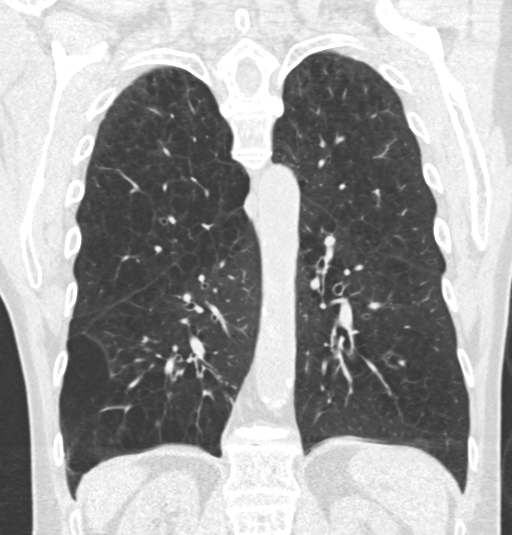

[15 of 36 positions shown; findings below may reference images not displayed]

FINDINGS: Cardiovascular: Normal heart size. No significant pericardial
effusion/thickening. Three-vessel coronary atherosclerosis.
Atherosclerotic nonaneurysmal thoracic aorta. Normal caliber
pulmonary arteries.

Mediastinum/Nodes: No discrete thyroid nodules. Unremarkable
esophagus. No pathologically enlarged axillary, mediastinal or hilar
lymph nodes, noting limited sensitivity for the detection of hilar
adenopathy on this noncontrast study.

Lungs/Pleura: No pneumothorax. No pleural effusion. Severe
centrilobular emphysema with diffuse bronchial wall thickening. No
acute consolidative airspace disease or lung masses. No significant
growth of previously visualized pulmonary nodules. No new
significant pulmonary nodules.

Upper abdomen: No acute abnormality.

Musculoskeletal: No aggressive appearing focal osseous lesions. Mild
thoracic spondylosis.
IMPRESSION: 1. Lung-RADS 2, benign appearance or behavior. Continue annual
screening with low-dose chest CT without contrast in 12 months.
2. Three-vessel coronary atherosclerosis.
3. Aortic Atherosclerosis (485PB-LKZ.Z) and Emphysema (485PB-0E7.5).

## 2023-09-14 ENCOUNTER — Ambulatory Visit
Admission: RE | Admit: 2023-09-14 | Discharge: 2023-09-14 | Disposition: A | Discharge status: Home / Self Care | Source: Ambulatory Visit | Attending: Radiation Oncology | Admitting: Radiation Oncology

## 2023-09-14 ENCOUNTER — Other Ambulatory Visit: Payer: Self-pay

## 2023-09-14 ENCOUNTER — Ambulatory Visit
Admission: RE | Admit: 2023-09-14 | Discharge: 2023-09-14 | Disposition: A | Discharge status: Home / Self Care | Source: Ambulatory Visit | Attending: Radiation Oncology

## 2023-09-14 DIAGNOSIS — Z510 Encounter for antineoplastic radiation therapy: Secondary | ICD-10-CM | POA: Diagnosis not present

## 2023-09-14 DIAGNOSIS — Z191 Hormone sensitive malignancy status: Secondary | ICD-10-CM | POA: Diagnosis not present

## 2023-09-14 DIAGNOSIS — C61 Malignant neoplasm of prostate: Secondary | ICD-10-CM | POA: Diagnosis not present

## 2023-09-14 LAB — RAD ONC ARIA SESSION SUMMARY
Course Elapsed Days: 4
Plan Fractions Treated to Date: 2
Plan Prescribed Dose Per Fraction: 2.5 Gy
Plan Total Fractions Prescribed: 28
Plan Total Prescribed Dose: 70 Gy
Reference Point Dosage Given to Date: 5 Gy
Reference Point Session Dosage Given: 2.5 Gy
Session Number: 2

## 2023-09-15 ENCOUNTER — Ambulatory Visit
Admission: RE | Admit: 2023-09-15 | Discharge: 2023-09-15 | Disposition: A | Discharge status: Home / Self Care | Source: Ambulatory Visit | Attending: Radiation Oncology | Admitting: Radiation Oncology

## 2023-09-15 ENCOUNTER — Other Ambulatory Visit: Payer: Self-pay

## 2023-09-15 DIAGNOSIS — C61 Malignant neoplasm of prostate: Secondary | ICD-10-CM | POA: Insufficient documentation

## 2023-09-15 DIAGNOSIS — Z510 Encounter for antineoplastic radiation therapy: Secondary | ICD-10-CM | POA: Diagnosis not present

## 2023-09-15 DIAGNOSIS — Z191 Hormone sensitive malignancy status: Secondary | ICD-10-CM | POA: Diagnosis not present

## 2023-09-15 LAB — RAD ONC ARIA SESSION SUMMARY
Course Elapsed Days: 5
Plan Fractions Treated to Date: 3
Plan Prescribed Dose Per Fraction: 2.5 Gy
Plan Total Fractions Prescribed: 28
Plan Total Prescribed Dose: 70 Gy
Reference Point Dosage Given to Date: 7.5 Gy
Reference Point Session Dosage Given: 2.5 Gy
Session Number: 3

## 2023-09-16 ENCOUNTER — Other Ambulatory Visit: Payer: Self-pay

## 2023-09-16 ENCOUNTER — Ambulatory Visit
Admission: RE | Admit: 2023-09-16 | Discharge: 2023-09-16 | Disposition: A | Discharge status: Home / Self Care | Source: Ambulatory Visit | Attending: Radiation Oncology

## 2023-09-16 DIAGNOSIS — Z191 Hormone sensitive malignancy status: Secondary | ICD-10-CM | POA: Diagnosis not present

## 2023-09-16 DIAGNOSIS — Z510 Encounter for antineoplastic radiation therapy: Secondary | ICD-10-CM | POA: Diagnosis not present

## 2023-09-16 DIAGNOSIS — C61 Malignant neoplasm of prostate: Secondary | ICD-10-CM | POA: Diagnosis not present

## 2023-09-16 LAB — RAD ONC ARIA SESSION SUMMARY
Course Elapsed Days: 6
Plan Fractions Treated to Date: 4
Plan Prescribed Dose Per Fraction: 2.5 Gy
Plan Total Fractions Prescribed: 28
Plan Total Prescribed Dose: 70 Gy
Reference Point Dosage Given to Date: 10 Gy
Reference Point Session Dosage Given: 2.5 Gy
Session Number: 4

## 2023-09-17 ENCOUNTER — Ambulatory Visit
Admission: RE | Admit: 2023-09-17 | Discharge: 2023-09-17 | Disposition: A | Discharge status: Home / Self Care | Source: Ambulatory Visit | Attending: Radiation Oncology | Admitting: Radiation Oncology

## 2023-09-17 ENCOUNTER — Other Ambulatory Visit: Payer: Self-pay

## 2023-09-17 DIAGNOSIS — C61 Malignant neoplasm of prostate: Secondary | ICD-10-CM | POA: Diagnosis not present

## 2023-09-17 DIAGNOSIS — Z510 Encounter for antineoplastic radiation therapy: Secondary | ICD-10-CM | POA: Diagnosis not present

## 2023-09-17 DIAGNOSIS — Z191 Hormone sensitive malignancy status: Secondary | ICD-10-CM | POA: Diagnosis not present

## 2023-09-17 LAB — RAD ONC ARIA SESSION SUMMARY
Course Elapsed Days: 7
Plan Fractions Treated to Date: 5
Plan Prescribed Dose Per Fraction: 2.5 Gy
Plan Total Fractions Prescribed: 28
Plan Total Prescribed Dose: 70 Gy
Reference Point Dosage Given to Date: 12.5 Gy
Reference Point Session Dosage Given: 2.5 Gy
Session Number: 5

## 2023-09-21 ENCOUNTER — Other Ambulatory Visit: Payer: Self-pay

## 2023-09-21 ENCOUNTER — Ambulatory Visit
Admission: RE | Admit: 2023-09-21 | Discharge: 2023-09-21 | Disposition: A | Discharge status: Home / Self Care | Source: Ambulatory Visit | Attending: Radiation Oncology | Admitting: Radiation Oncology

## 2023-09-21 DIAGNOSIS — Z510 Encounter for antineoplastic radiation therapy: Secondary | ICD-10-CM | POA: Diagnosis not present

## 2023-09-21 DIAGNOSIS — Z191 Hormone sensitive malignancy status: Secondary | ICD-10-CM | POA: Diagnosis not present

## 2023-09-21 DIAGNOSIS — C61 Malignant neoplasm of prostate: Secondary | ICD-10-CM | POA: Diagnosis not present

## 2023-09-21 LAB — RAD ONC ARIA SESSION SUMMARY
Course Elapsed Days: 11
Plan Fractions Treated to Date: 6
Plan Prescribed Dose Per Fraction: 2.5 Gy
Plan Total Fractions Prescribed: 28
Plan Total Prescribed Dose: 70 Gy
Reference Point Dosage Given to Date: 15 Gy
Reference Point Session Dosage Given: 2.5 Gy
Session Number: 6

## 2023-09-22 ENCOUNTER — Other Ambulatory Visit: Payer: Self-pay

## 2023-09-22 ENCOUNTER — Ambulatory Visit
Admission: RE | Admit: 2023-09-22 | Discharge: 2023-09-22 | Disposition: A | Discharge status: Home / Self Care | Source: Ambulatory Visit | Attending: Radiation Oncology | Admitting: Radiation Oncology

## 2023-09-22 DIAGNOSIS — Z510 Encounter for antineoplastic radiation therapy: Secondary | ICD-10-CM | POA: Diagnosis not present

## 2023-09-22 DIAGNOSIS — C61 Malignant neoplasm of prostate: Secondary | ICD-10-CM | POA: Diagnosis not present

## 2023-09-22 DIAGNOSIS — Z191 Hormone sensitive malignancy status: Secondary | ICD-10-CM | POA: Diagnosis not present

## 2023-09-22 LAB — RAD ONC ARIA SESSION SUMMARY
Course Elapsed Days: 12
Plan Fractions Treated to Date: 7
Plan Prescribed Dose Per Fraction: 2.5 Gy
Plan Total Fractions Prescribed: 28
Plan Total Prescribed Dose: 70 Gy
Reference Point Dosage Given to Date: 17.5 Gy
Reference Point Session Dosage Given: 2.5 Gy
Session Number: 7

## 2023-09-23 ENCOUNTER — Other Ambulatory Visit: Payer: Self-pay

## 2023-09-23 ENCOUNTER — Ambulatory Visit
Admission: RE | Admit: 2023-09-23 | Discharge: 2023-09-23 | Disposition: A | Discharge status: Home / Self Care | Source: Ambulatory Visit | Attending: Radiation Oncology | Admitting: Radiation Oncology

## 2023-09-23 DIAGNOSIS — C61 Malignant neoplasm of prostate: Secondary | ICD-10-CM | POA: Diagnosis not present

## 2023-09-23 DIAGNOSIS — Z510 Encounter for antineoplastic radiation therapy: Secondary | ICD-10-CM | POA: Diagnosis not present

## 2023-09-23 DIAGNOSIS — Z191 Hormone sensitive malignancy status: Secondary | ICD-10-CM | POA: Diagnosis not present

## 2023-09-23 LAB — RAD ONC ARIA SESSION SUMMARY
Course Elapsed Days: 13
Plan Fractions Treated to Date: 8
Plan Prescribed Dose Per Fraction: 2.5 Gy
Plan Total Fractions Prescribed: 28
Plan Total Prescribed Dose: 70 Gy
Reference Point Dosage Given to Date: 20 Gy
Reference Point Session Dosage Given: 2.5 Gy
Session Number: 8

## 2023-09-24 ENCOUNTER — Ambulatory Visit
Admission: RE | Admit: 2023-09-24 | Discharge: 2023-09-24 | Disposition: A | Discharge status: Home / Self Care | Source: Ambulatory Visit | Attending: Radiation Oncology | Admitting: Radiation Oncology

## 2023-09-24 ENCOUNTER — Other Ambulatory Visit: Payer: Self-pay

## 2023-09-24 DIAGNOSIS — C61 Malignant neoplasm of prostate: Secondary | ICD-10-CM | POA: Diagnosis not present

## 2023-09-24 DIAGNOSIS — Z191 Hormone sensitive malignancy status: Secondary | ICD-10-CM | POA: Diagnosis not present

## 2023-09-24 DIAGNOSIS — Z510 Encounter for antineoplastic radiation therapy: Secondary | ICD-10-CM | POA: Diagnosis not present

## 2023-09-24 LAB — RAD ONC ARIA SESSION SUMMARY
Course Elapsed Days: 14
Plan Fractions Treated to Date: 9
Plan Prescribed Dose Per Fraction: 2.5 Gy
Plan Total Fractions Prescribed: 28
Plan Total Prescribed Dose: 70 Gy
Reference Point Dosage Given to Date: 22.5 Gy
Reference Point Session Dosage Given: 2.5 Gy
Session Number: 9

## 2023-09-25 ENCOUNTER — Ambulatory Visit
Admission: RE | Admit: 2023-09-25 | Discharge: 2023-09-25 | Disposition: A | Discharge status: Home / Self Care | Source: Ambulatory Visit | Attending: Radiation Oncology

## 2023-09-25 ENCOUNTER — Other Ambulatory Visit: Payer: Self-pay

## 2023-09-25 DIAGNOSIS — C61 Malignant neoplasm of prostate: Secondary | ICD-10-CM | POA: Diagnosis not present

## 2023-09-25 DIAGNOSIS — Z510 Encounter for antineoplastic radiation therapy: Secondary | ICD-10-CM | POA: Diagnosis not present

## 2023-09-25 DIAGNOSIS — Z191 Hormone sensitive malignancy status: Secondary | ICD-10-CM | POA: Diagnosis not present

## 2023-09-25 LAB — RAD ONC ARIA SESSION SUMMARY
Course Elapsed Days: 15
Plan Fractions Treated to Date: 10
Plan Prescribed Dose Per Fraction: 2.5 Gy
Plan Total Fractions Prescribed: 28
Plan Total Prescribed Dose: 70 Gy
Reference Point Dosage Given to Date: 25 Gy
Reference Point Session Dosage Given: 2.5 Gy
Session Number: 10

## 2023-09-28 ENCOUNTER — Other Ambulatory Visit: Payer: Self-pay

## 2023-09-28 ENCOUNTER — Ambulatory Visit
Admission: RE | Admit: 2023-09-28 | Discharge: 2023-09-28 | Discharge status: Home / Self Care | Source: Ambulatory Visit | Attending: Radiation Oncology

## 2023-09-28 DIAGNOSIS — Z510 Encounter for antineoplastic radiation therapy: Secondary | ICD-10-CM | POA: Diagnosis not present

## 2023-09-28 DIAGNOSIS — Z191 Hormone sensitive malignancy status: Secondary | ICD-10-CM | POA: Diagnosis not present

## 2023-09-28 DIAGNOSIS — C61 Malignant neoplasm of prostate: Secondary | ICD-10-CM | POA: Diagnosis not present

## 2023-09-28 LAB — RAD ONC ARIA SESSION SUMMARY
Course Elapsed Days: 18
Plan Fractions Treated to Date: 11
Plan Prescribed Dose Per Fraction: 2.5 Gy
Plan Total Fractions Prescribed: 28
Plan Total Prescribed Dose: 70 Gy
Reference Point Dosage Given to Date: 27.5 Gy
Reference Point Session Dosage Given: 2.5 Gy
Session Number: 11

## 2023-09-29 ENCOUNTER — Ambulatory Visit
Admission: RE | Admit: 2023-09-29 | Discharge: 2023-09-29 | Disposition: A | Discharge status: Home / Self Care | Source: Ambulatory Visit | Attending: Radiation Oncology | Admitting: Radiation Oncology

## 2023-09-29 ENCOUNTER — Other Ambulatory Visit: Payer: Self-pay

## 2023-09-29 DIAGNOSIS — Z510 Encounter for antineoplastic radiation therapy: Secondary | ICD-10-CM | POA: Diagnosis not present

## 2023-09-29 DIAGNOSIS — Z191 Hormone sensitive malignancy status: Secondary | ICD-10-CM | POA: Diagnosis not present

## 2023-09-29 DIAGNOSIS — C61 Malignant neoplasm of prostate: Secondary | ICD-10-CM | POA: Diagnosis not present

## 2023-09-29 LAB — RAD ONC ARIA SESSION SUMMARY
Course Elapsed Days: 19
Plan Fractions Treated to Date: 12
Plan Prescribed Dose Per Fraction: 2.5 Gy
Plan Total Fractions Prescribed: 28
Plan Total Prescribed Dose: 70 Gy
Reference Point Dosage Given to Date: 30 Gy
Reference Point Session Dosage Given: 2.5 Gy
Session Number: 12

## 2023-09-30 ENCOUNTER — Other Ambulatory Visit: Payer: Self-pay

## 2023-09-30 ENCOUNTER — Ambulatory Visit
Admission: RE | Admit: 2023-09-30 | Discharge: 2023-09-30 | Disposition: A | Discharge status: Home / Self Care | Source: Ambulatory Visit | Attending: Radiation Oncology | Admitting: Radiation Oncology

## 2023-09-30 DIAGNOSIS — Z510 Encounter for antineoplastic radiation therapy: Secondary | ICD-10-CM | POA: Diagnosis not present

## 2023-09-30 DIAGNOSIS — C61 Malignant neoplasm of prostate: Secondary | ICD-10-CM | POA: Diagnosis not present

## 2023-09-30 DIAGNOSIS — Z191 Hormone sensitive malignancy status: Secondary | ICD-10-CM | POA: Diagnosis not present

## 2023-09-30 LAB — RAD ONC ARIA SESSION SUMMARY
Course Elapsed Days: 20
Plan Fractions Treated to Date: 13
Plan Prescribed Dose Per Fraction: 2.5 Gy
Plan Total Fractions Prescribed: 28
Plan Total Prescribed Dose: 70 Gy
Reference Point Dosage Given to Date: 32.5 Gy
Reference Point Session Dosage Given: 2.5 Gy
Session Number: 13

## 2023-10-01 ENCOUNTER — Other Ambulatory Visit: Payer: Self-pay

## 2023-10-01 ENCOUNTER — Ambulatory Visit
Admission: RE | Admit: 2023-10-01 | Discharge: 2023-10-01 | Discharge status: Home / Self Care | Source: Ambulatory Visit | Attending: Radiation Oncology

## 2023-10-01 DIAGNOSIS — C61 Malignant neoplasm of prostate: Secondary | ICD-10-CM | POA: Diagnosis not present

## 2023-10-01 DIAGNOSIS — Z191 Hormone sensitive malignancy status: Secondary | ICD-10-CM | POA: Diagnosis not present

## 2023-10-01 DIAGNOSIS — Z510 Encounter for antineoplastic radiation therapy: Secondary | ICD-10-CM | POA: Diagnosis not present

## 2023-10-01 LAB — RAD ONC ARIA SESSION SUMMARY
Course Elapsed Days: 21
Plan Fractions Treated to Date: 14
Plan Prescribed Dose Per Fraction: 2.5 Gy
Plan Total Fractions Prescribed: 28
Plan Total Prescribed Dose: 70 Gy
Reference Point Dosage Given to Date: 35 Gy
Reference Point Session Dosage Given: 2.5 Gy
Session Number: 14

## 2023-10-02 ENCOUNTER — Ambulatory Visit

## 2023-10-05 ENCOUNTER — Ambulatory Visit
Admission: RE | Admit: 2023-10-05 | Discharge: 2023-10-05 | Disposition: A | Discharge status: Home / Self Care | Source: Ambulatory Visit | Attending: Radiation Oncology | Admitting: Radiation Oncology

## 2023-10-05 ENCOUNTER — Ambulatory Visit

## 2023-10-05 ENCOUNTER — Other Ambulatory Visit: Payer: Self-pay

## 2023-10-05 DIAGNOSIS — Z510 Encounter for antineoplastic radiation therapy: Secondary | ICD-10-CM | POA: Diagnosis not present

## 2023-10-05 DIAGNOSIS — C61 Malignant neoplasm of prostate: Secondary | ICD-10-CM | POA: Diagnosis not present

## 2023-10-05 DIAGNOSIS — Z191 Hormone sensitive malignancy status: Secondary | ICD-10-CM | POA: Diagnosis not present

## 2023-10-05 LAB — RAD ONC ARIA SESSION SUMMARY
Course Elapsed Days: 25
Plan Fractions Treated to Date: 15
Plan Prescribed Dose Per Fraction: 2.5 Gy
Plan Total Fractions Prescribed: 28
Plan Total Prescribed Dose: 70 Gy
Reference Point Dosage Given to Date: 37.5 Gy
Reference Point Session Dosage Given: 2.5 Gy
Session Number: 15

## 2023-10-06 ENCOUNTER — Ambulatory Visit
Admission: RE | Admit: 2023-10-06 | Discharge: 2023-10-06 | Disposition: A | Discharge status: Home / Self Care | Source: Ambulatory Visit | Attending: Radiation Oncology | Admitting: Radiation Oncology

## 2023-10-06 ENCOUNTER — Ambulatory Visit

## 2023-10-06 ENCOUNTER — Other Ambulatory Visit: Payer: Self-pay

## 2023-10-06 DIAGNOSIS — Z510 Encounter for antineoplastic radiation therapy: Secondary | ICD-10-CM | POA: Diagnosis not present

## 2023-10-06 DIAGNOSIS — Z191 Hormone sensitive malignancy status: Secondary | ICD-10-CM | POA: Diagnosis not present

## 2023-10-06 DIAGNOSIS — C61 Malignant neoplasm of prostate: Secondary | ICD-10-CM | POA: Diagnosis not present

## 2023-10-06 LAB — RAD ONC ARIA SESSION SUMMARY
Course Elapsed Days: 26
Plan Fractions Treated to Date: 16
Plan Prescribed Dose Per Fraction: 2.5 Gy
Plan Total Fractions Prescribed: 28
Plan Total Prescribed Dose: 70 Gy
Reference Point Dosage Given to Date: 40 Gy
Reference Point Session Dosage Given: 2.5 Gy
Session Number: 16

## 2023-10-07 ENCOUNTER — Ambulatory Visit

## 2023-10-08 ENCOUNTER — Other Ambulatory Visit: Payer: Self-pay

## 2023-10-08 ENCOUNTER — Ambulatory Visit
Admission: RE | Admit: 2023-10-08 | Discharge: 2023-10-08 | Disposition: A | Discharge status: Home / Self Care | Source: Ambulatory Visit | Attending: Radiation Oncology

## 2023-10-08 DIAGNOSIS — Z510 Encounter for antineoplastic radiation therapy: Secondary | ICD-10-CM | POA: Diagnosis not present

## 2023-10-08 DIAGNOSIS — Z191 Hormone sensitive malignancy status: Secondary | ICD-10-CM | POA: Diagnosis not present

## 2023-10-08 DIAGNOSIS — C61 Malignant neoplasm of prostate: Secondary | ICD-10-CM | POA: Diagnosis not present

## 2023-10-08 LAB — RAD ONC ARIA SESSION SUMMARY
Course Elapsed Days: 28
Plan Fractions Treated to Date: 17
Plan Prescribed Dose Per Fraction: 2.5 Gy
Plan Total Fractions Prescribed: 28
Plan Total Prescribed Dose: 70 Gy
Reference Point Dosage Given to Date: 42.5 Gy
Reference Point Session Dosage Given: 2.5 Gy
Session Number: 17

## 2023-10-09 ENCOUNTER — Other Ambulatory Visit: Payer: Self-pay

## 2023-10-09 ENCOUNTER — Ambulatory Visit
Admission: RE | Admit: 2023-10-09 | Discharge: 2023-10-09 | Disposition: A | Discharge status: Home / Self Care | Source: Ambulatory Visit | Attending: Radiation Oncology | Admitting: Radiation Oncology

## 2023-10-09 DIAGNOSIS — C61 Malignant neoplasm of prostate: Secondary | ICD-10-CM | POA: Diagnosis not present

## 2023-10-09 DIAGNOSIS — Z510 Encounter for antineoplastic radiation therapy: Secondary | ICD-10-CM | POA: Diagnosis not present

## 2023-10-09 DIAGNOSIS — Z191 Hormone sensitive malignancy status: Secondary | ICD-10-CM | POA: Diagnosis not present

## 2023-10-09 LAB — RAD ONC ARIA SESSION SUMMARY
Course Elapsed Days: 29
Plan Fractions Treated to Date: 18
Plan Prescribed Dose Per Fraction: 2.5 Gy
Plan Total Fractions Prescribed: 28
Plan Total Prescribed Dose: 70 Gy
Reference Point Dosage Given to Date: 45 Gy
Reference Point Session Dosage Given: 2.5 Gy
Session Number: 18

## 2023-10-12 ENCOUNTER — Other Ambulatory Visit: Payer: Self-pay

## 2023-10-12 ENCOUNTER — Ambulatory Visit
Admission: RE | Admit: 2023-10-12 | Discharge: 2023-10-12 | Disposition: A | Discharge status: Home / Self Care | Source: Ambulatory Visit | Attending: Radiation Oncology | Admitting: Radiation Oncology

## 2023-10-12 DIAGNOSIS — Z191 Hormone sensitive malignancy status: Secondary | ICD-10-CM | POA: Diagnosis not present

## 2023-10-12 DIAGNOSIS — C61 Malignant neoplasm of prostate: Secondary | ICD-10-CM | POA: Diagnosis not present

## 2023-10-12 DIAGNOSIS — Z510 Encounter for antineoplastic radiation therapy: Secondary | ICD-10-CM | POA: Diagnosis not present

## 2023-10-12 LAB — RAD ONC ARIA SESSION SUMMARY
Course Elapsed Days: 32
Plan Fractions Treated to Date: 19
Plan Prescribed Dose Per Fraction: 2.5 Gy
Plan Total Fractions Prescribed: 28
Plan Total Prescribed Dose: 70 Gy
Reference Point Dosage Given to Date: 47.5 Gy
Reference Point Session Dosage Given: 2.5 Gy
Session Number: 19

## 2023-10-13 ENCOUNTER — Ambulatory Visit
Admission: RE | Admit: 2023-10-13 | Discharge: 2023-10-13 | Disposition: A | Discharge status: Home / Self Care | Source: Ambulatory Visit | Attending: Radiation Oncology

## 2023-10-13 ENCOUNTER — Other Ambulatory Visit: Payer: Self-pay

## 2023-10-13 DIAGNOSIS — Z510 Encounter for antineoplastic radiation therapy: Secondary | ICD-10-CM | POA: Diagnosis not present

## 2023-10-13 DIAGNOSIS — C61 Malignant neoplasm of prostate: Secondary | ICD-10-CM | POA: Diagnosis not present

## 2023-10-13 DIAGNOSIS — Z191 Hormone sensitive malignancy status: Secondary | ICD-10-CM | POA: Diagnosis not present

## 2023-10-13 LAB — RAD ONC ARIA SESSION SUMMARY
Course Elapsed Days: 33
Plan Fractions Treated to Date: 20
Plan Prescribed Dose Per Fraction: 2.5 Gy
Plan Total Fractions Prescribed: 28
Plan Total Prescribed Dose: 70 Gy
Reference Point Dosage Given to Date: 50 Gy
Reference Point Session Dosage Given: 2.5 Gy
Session Number: 20

## 2023-10-14 ENCOUNTER — Ambulatory Visit
Admission: RE | Admit: 2023-10-14 | Discharge: 2023-10-14 | Disposition: A | Discharge status: Home / Self Care | Source: Ambulatory Visit | Attending: Radiation Oncology

## 2023-10-14 ENCOUNTER — Other Ambulatory Visit: Payer: Self-pay

## 2023-10-14 DIAGNOSIS — Z191 Hormone sensitive malignancy status: Secondary | ICD-10-CM | POA: Diagnosis not present

## 2023-10-14 DIAGNOSIS — Z510 Encounter for antineoplastic radiation therapy: Secondary | ICD-10-CM | POA: Diagnosis not present

## 2023-10-14 DIAGNOSIS — C61 Malignant neoplasm of prostate: Secondary | ICD-10-CM | POA: Diagnosis not present

## 2023-10-14 LAB — RAD ONC ARIA SESSION SUMMARY
Course Elapsed Days: 34
Plan Fractions Treated to Date: 21
Plan Prescribed Dose Per Fraction: 2.5 Gy
Plan Total Fractions Prescribed: 28
Plan Total Prescribed Dose: 70 Gy
Reference Point Dosage Given to Date: 52.5 Gy
Reference Point Session Dosage Given: 2.5 Gy
Session Number: 21

## 2023-10-15 ENCOUNTER — Ambulatory Visit
Admission: RE | Admit: 2023-10-15 | Discharge: 2023-10-15 | Disposition: A | Discharge status: Home / Self Care | Source: Ambulatory Visit | Attending: Radiation Oncology | Admitting: Radiation Oncology

## 2023-10-15 ENCOUNTER — Other Ambulatory Visit: Payer: Self-pay

## 2023-10-15 DIAGNOSIS — Z510 Encounter for antineoplastic radiation therapy: Secondary | ICD-10-CM | POA: Diagnosis not present

## 2023-10-15 DIAGNOSIS — Z191 Hormone sensitive malignancy status: Secondary | ICD-10-CM | POA: Diagnosis not present

## 2023-10-15 DIAGNOSIS — C61 Malignant neoplasm of prostate: Secondary | ICD-10-CM | POA: Diagnosis not present

## 2023-10-15 LAB — RAD ONC ARIA SESSION SUMMARY
Course Elapsed Days: 35
Plan Fractions Treated to Date: 22
Plan Prescribed Dose Per Fraction: 2.5 Gy
Plan Total Fractions Prescribed: 28
Plan Total Prescribed Dose: 70 Gy
Reference Point Dosage Given to Date: 55 Gy
Reference Point Session Dosage Given: 2.5 Gy
Session Number: 22

## 2023-10-16 ENCOUNTER — Ambulatory Visit

## 2023-10-19 ENCOUNTER — Ambulatory Visit
Admission: RE | Admit: 2023-10-19 | Discharge: 2023-10-19 | Disposition: A | Discharge status: Home / Self Care | Source: Ambulatory Visit | Attending: Radiation Oncology | Admitting: Radiation Oncology

## 2023-10-19 ENCOUNTER — Other Ambulatory Visit: Payer: Self-pay

## 2023-10-19 DIAGNOSIS — Z191 Hormone sensitive malignancy status: Secondary | ICD-10-CM | POA: Diagnosis not present

## 2023-10-19 DIAGNOSIS — Z510 Encounter for antineoplastic radiation therapy: Secondary | ICD-10-CM | POA: Diagnosis not present

## 2023-10-19 DIAGNOSIS — C61 Malignant neoplasm of prostate: Secondary | ICD-10-CM | POA: Insufficient documentation

## 2023-10-19 LAB — RAD ONC ARIA SESSION SUMMARY
Course Elapsed Days: 39
Plan Fractions Treated to Date: 23
Plan Prescribed Dose Per Fraction: 2.5 Gy
Plan Total Fractions Prescribed: 28
Plan Total Prescribed Dose: 70 Gy
Reference Point Dosage Given to Date: 57.5 Gy
Reference Point Session Dosage Given: 2.5 Gy
Session Number: 23

## 2023-10-20 ENCOUNTER — Ambulatory Visit

## 2023-10-20 ENCOUNTER — Other Ambulatory Visit: Payer: Self-pay

## 2023-10-20 ENCOUNTER — Ambulatory Visit
Admission: RE | Admit: 2023-10-20 | Discharge: 2023-10-20 | Disposition: A | Discharge status: Home / Self Care | Source: Ambulatory Visit | Attending: Radiation Oncology | Admitting: Radiation Oncology

## 2023-10-20 DIAGNOSIS — C61 Malignant neoplasm of prostate: Secondary | ICD-10-CM | POA: Diagnosis not present

## 2023-10-20 DIAGNOSIS — Z510 Encounter for antineoplastic radiation therapy: Secondary | ICD-10-CM | POA: Diagnosis not present

## 2023-10-20 DIAGNOSIS — Z191 Hormone sensitive malignancy status: Secondary | ICD-10-CM | POA: Diagnosis not present

## 2023-10-20 LAB — RAD ONC ARIA SESSION SUMMARY
Course Elapsed Days: 40
Plan Fractions Treated to Date: 24
Plan Prescribed Dose Per Fraction: 2.5 Gy
Plan Total Fractions Prescribed: 28
Plan Total Prescribed Dose: 70 Gy
Reference Point Dosage Given to Date: 60 Gy
Reference Point Session Dosage Given: 2.5 Gy
Session Number: 24

## 2023-10-21 ENCOUNTER — Other Ambulatory Visit: Payer: Self-pay

## 2023-10-21 ENCOUNTER — Ambulatory Visit

## 2023-10-21 ENCOUNTER — Ambulatory Visit
Admission: RE | Admit: 2023-10-21 | Discharge: 2023-10-21 | Disposition: A | Discharge status: Home / Self Care | Source: Ambulatory Visit | Attending: Radiation Oncology | Admitting: Radiation Oncology

## 2023-10-21 DIAGNOSIS — Z191 Hormone sensitive malignancy status: Secondary | ICD-10-CM | POA: Diagnosis not present

## 2023-10-21 DIAGNOSIS — Z510 Encounter for antineoplastic radiation therapy: Secondary | ICD-10-CM | POA: Diagnosis not present

## 2023-10-21 DIAGNOSIS — C61 Malignant neoplasm of prostate: Secondary | ICD-10-CM | POA: Diagnosis not present

## 2023-10-21 LAB — RAD ONC ARIA SESSION SUMMARY
Course Elapsed Days: 41
Plan Fractions Treated to Date: 25
Plan Prescribed Dose Per Fraction: 2.5 Gy
Plan Total Fractions Prescribed: 28
Plan Total Prescribed Dose: 70 Gy
Reference Point Dosage Given to Date: 62.5 Gy
Reference Point Session Dosage Given: 2.5 Gy
Session Number: 25

## 2023-10-22 ENCOUNTER — Ambulatory Visit
Admission: RE | Admit: 2023-10-22 | Discharge: 2023-10-22 | Disposition: A | Discharge status: Home / Self Care | Source: Ambulatory Visit | Attending: Radiation Oncology

## 2023-10-22 ENCOUNTER — Ambulatory Visit
Admission: RE | Admit: 2023-10-22 | Discharge: 2023-10-22 | Disposition: A | Discharge status: Home / Self Care | Source: Ambulatory Visit | Attending: Radiation Oncology | Admitting: Radiation Oncology

## 2023-10-22 ENCOUNTER — Ambulatory Visit

## 2023-10-22 ENCOUNTER — Other Ambulatory Visit: Payer: Self-pay

## 2023-10-22 DIAGNOSIS — Z191 Hormone sensitive malignancy status: Secondary | ICD-10-CM | POA: Diagnosis not present

## 2023-10-22 DIAGNOSIS — C61 Malignant neoplasm of prostate: Secondary | ICD-10-CM | POA: Diagnosis not present

## 2023-10-22 DIAGNOSIS — Z510 Encounter for antineoplastic radiation therapy: Secondary | ICD-10-CM | POA: Diagnosis not present

## 2023-10-22 LAB — RAD ONC ARIA SESSION SUMMARY
Course Elapsed Days: 42
Plan Fractions Treated to Date: 26
Plan Prescribed Dose Per Fraction: 2.5 Gy
Plan Total Fractions Prescribed: 28
Plan Total Prescribed Dose: 70 Gy
Reference Point Dosage Given to Date: 65 Gy
Reference Point Session Dosage Given: 2.5 Gy
Session Number: 26

## 2023-10-23 ENCOUNTER — Other Ambulatory Visit: Payer: Self-pay

## 2023-10-23 ENCOUNTER — Ambulatory Visit

## 2023-10-23 ENCOUNTER — Ambulatory Visit
Admission: RE | Admit: 2023-10-23 | Discharge: 2023-10-23 | Disposition: A | Discharge status: Home / Self Care | Source: Ambulatory Visit | Attending: Radiation Oncology | Admitting: Radiation Oncology

## 2023-10-23 DIAGNOSIS — Z191 Hormone sensitive malignancy status: Secondary | ICD-10-CM | POA: Diagnosis not present

## 2023-10-23 DIAGNOSIS — C61 Malignant neoplasm of prostate: Secondary | ICD-10-CM | POA: Diagnosis not present

## 2023-10-23 DIAGNOSIS — Z510 Encounter for antineoplastic radiation therapy: Secondary | ICD-10-CM | POA: Diagnosis not present

## 2023-10-23 LAB — RAD ONC ARIA SESSION SUMMARY
Course Elapsed Days: 43
Plan Fractions Treated to Date: 27
Plan Prescribed Dose Per Fraction: 2.5 Gy
Plan Total Fractions Prescribed: 28
Plan Total Prescribed Dose: 70 Gy
Reference Point Dosage Given to Date: 67.5 Gy
Reference Point Session Dosage Given: 2.5 Gy
Session Number: 27

## 2023-10-26 ENCOUNTER — Ambulatory Visit

## 2023-10-26 ENCOUNTER — Other Ambulatory Visit: Payer: Self-pay

## 2023-10-26 ENCOUNTER — Ambulatory Visit
Admission: RE | Admit: 2023-10-26 | Discharge: 2023-10-26 | Disposition: A | Discharge status: Home / Self Care | Source: Ambulatory Visit | Attending: Radiation Oncology | Admitting: Radiation Oncology

## 2023-10-26 DIAGNOSIS — C61 Malignant neoplasm of prostate: Secondary | ICD-10-CM

## 2023-10-26 DIAGNOSIS — Z191 Hormone sensitive malignancy status: Secondary | ICD-10-CM | POA: Diagnosis not present

## 2023-10-26 DIAGNOSIS — Z510 Encounter for antineoplastic radiation therapy: Secondary | ICD-10-CM | POA: Diagnosis not present

## 2023-10-26 LAB — RAD ONC ARIA SESSION SUMMARY
Course Elapsed Days: 46
Plan Fractions Treated to Date: 28
Plan Prescribed Dose Per Fraction: 2.5 Gy
Plan Total Fractions Prescribed: 28
Plan Total Prescribed Dose: 70 Gy
Reference Point Dosage Given to Date: 70 Gy
Reference Point Session Dosage Given: 2.5 Gy
Session Number: 28

## 2023-10-27 NOTE — Radiation Completion Notes (Signed)
  Radiation Oncology         [phone]) [phone] ________________________________  Name: [name] MRN: [id number]  Date: 10/26/2023  DOB: Jun 10, 1950  Date of Birth: [birth year] Referring Physician: NORLEEN SELTZER, M.D. Date of Service: 2023-10-27 Radiation Oncologist: Adina Barge, M.D. [facility]   RADIATION ONCOLOGY END OF TREATMENT NOTE     Diagnosis: 73 y.o. gentleman with stage T1c adenocarcinoma of the prostate with a Gleason's score of 3+4 and a PSA of 4.63   Intent: Curative     ==========DELIVERED PLANS==========  First Treatment Date: 2023-09-10 Last Treatment Date: 2023-10-26   Plan Name: Prostate Site: Prostate Technique: IMRT Mode: Photon Dose Per Fraction: 2.5 Gy Prescribed Dose (Delivered / Prescribed): 70 Gy / 70 Gy Prescribed Fxs (Delivered / Prescribed): 28 / 28     ==========ON TREATMENT VISIT DATES========== 2023-09-14, 2023-09-17, 2023-09-24, 2023-10-06, 2023-10-13, 2023-10-19, 2023-10-22    See weekly On Treatment Notes in Epic for details in the Media tab (listed as Progress notes on the On Treatment Visit Dates listed above).  The patient will receive a call in about one month from the radiation oncology department. He will continue follow up with his urologist, Dr. SELTZER, as well.  ------------------------------------------------   Donnice Barge, MD [facility]  Radiation Oncology Direct Dial: [phone]  Fax: [phone] [email or web address]  Skype  [email or web address]

## 2023-11-02 NOTE — Progress Notes (Signed)
 Patient was presented at the [facility] on 04/28/23 for his stage T1c adenocarcinoma of the prostate with a Gleason's score of 3+4 and a PSA of 4.63.  Patient proceed with treatment recommendations of 5.5 weeks of external beam therapy and had his final radiation treatment on 10/26/23.   Patient is scheduled for a post treatment nurse call on 11/24/23 and has his first post treatment PSA on 01/13/24 at [facility].     RN left message for call back for any questions post treatment.

## 2023-11-06 ENCOUNTER — Other Ambulatory Visit: Payer: Self-pay | Admitting: Urology

## 2023-11-06 DIAGNOSIS — C61 Malignant neoplasm of prostate: Secondary | ICD-10-CM

## 2023-11-13 ENCOUNTER — Ambulatory Visit (HOSPITAL_BASED_OUTPATIENT_CLINIC_OR_DEPARTMENT_OTHER)
Admission: RE | Admit: 2023-11-13 | Discharge: 2023-11-13 | Disposition: A | Discharge status: Home / Self Care | Source: Ambulatory Visit | Attending: Acute Care | Admitting: Acute Care

## 2023-11-13 ENCOUNTER — Ambulatory Visit (HOSPITAL_COMMUNITY)

## 2023-11-13 DIAGNOSIS — J439 Emphysema, unspecified: Secondary | ICD-10-CM | POA: Diagnosis not present

## 2023-11-13 DIAGNOSIS — R062 Wheezing: Secondary | ICD-10-CM | POA: Insufficient documentation

## 2023-11-13 DIAGNOSIS — R918 Other nonspecific abnormal finding of lung field: Secondary | ICD-10-CM | POA: Diagnosis not present

## 2023-11-13 DIAGNOSIS — C61 Malignant neoplasm of prostate: Secondary | ICD-10-CM | POA: Diagnosis not present

## 2023-11-18 ENCOUNTER — Encounter: Payer: Self-pay | Admitting: Pulmonary Disease

## 2023-11-18 ENCOUNTER — Ambulatory Visit: Admitting: Pulmonary Disease

## 2023-11-18 VITALS — BP 100/60 | HR 70 | Temp 97.8°F | Ht 74.0 in | Wt 169.4 lb

## 2023-11-18 DIAGNOSIS — J432 Centrilobular emphysema: Secondary | ICD-10-CM

## 2023-11-18 MED ORDER — AZITHROMYCIN 250 MG PO TABS: 250.0000 mg | ORAL_TABLET | Freq: Every day | ORAL | 0 refills | Status: DC

## 2023-11-18 NOTE — Progress Notes (Signed)
 Synopsis: Referred in by Rollene Almarie LABOR, *   Subjective:   PATIENT ID: [name] GENDER: male DOB: 1950-11-27, MRN: [id number]  Chief Complaint  Patient presents with   Consult    Review CT results, CT: 11/13/23  Has noticed he has been SOB lately and is interested in pulmonary rehab. Has been walking more lately and can do 3/4's of a mile before having to stop. Wet cough, typically white but can be blood tinged. No wheezing. Still using trelegy and albuterol  as prescribed which has been helpful.    HPI [name] is a pleasant 73 year old male patient with a past medical history of prostate cancer diagnosed in January 2025 status post radiation, left upper lobe nodule status post robotic assisted navigational bronchoscopy which was negative for malignancy, COPD with emphysema presenting today to the pulmonary clinic for follow-up visit.  He was recently diagnosed with prostate cancer and completed radiation last month.  Current of the workup he was found to have a left upper.  This was biopsied on 05/29 which was negative.  Given concern for possibly false negative we decided to repeat a CT chest in 3 months.  Repeat CT chest on 08/29 shows significantly decreased left upper lobe nodule which was reassuring.  It also showed a new right upper lobe nodule that appears to be infectious.  He also has a history of COPD and used to see Dr. Alaine.  He is currently on Trelegy Ellipta  101 puff daily and albuterol  basis.  He did not have full PFTs but had spirometry in 2018 that showed an FEV1 of 45% of predicted with an FEV1/FVC ratio of 54%.  Family history -lung cancer in his biological mother was a smoker.  Social history previous smoker quit in 2017.  Smoked 2 packs/day for 42 years.   ROS All systems were reviewed and are negative exceot for the above.  Objective:   Vitals:   11/18/23 0935  BP: 100/60  Pulse: 70  Temp: 97.8 F (36.6 C)  SpO2: 96%  Weight: 169 lb 6.4  oz (76.8 kg)  Height: 6' 2 (1.88 m)   96% on RA BMI Readings from Last 3 Encounters:  11/18/23 21.75 kg/m  08/28/23 21.83 kg/m  08/21/23 21.65 kg/m   Wt Readings from Last 3 Encounters:  11/18/23 169 lb 6.4 oz (76.8 kg)  08/28/23 170 lb (77.1 kg)  08/21/23 168 lb 9.6 oz (76.5 kg)    Physical Exam GEN: NAD, Healthy Appearing HEENT: Supple Neck, Reactive Pupils, EOMI  CVS: Normal S1, Normal S2, RRR, No murmurs or ES appreciated  Lungs: Poor air movement otherwise no wheezing or crackles appreciated.  Abdomen: Soft, non tender, non distended, + BS  Extremities: Warm and well perfused, No edema  Skin: No suspicious lesions appreciated  Psych: Normal Affect  Ancillary Information   CBC    Component Value Date/Time   WBC 8.4 08/13/2023 0816   RBC 5.12 08/13/2023 0816   HGB 16.7 08/13/2023 0816   HGB 15.9 11/19/2022 0905   HCT 48.6 08/13/2023 0816   HCT 46.2 11/19/2022 0905   PLT 270 08/13/2023 0816   PLT 276 11/19/2022 0905   MCV 94.9 08/13/2023 0816   MCV 94 11/19/2022 0905   MCH 32.6 08/13/2023 0816   MCHC 34.4 08/13/2023 0816   RDW 12.9 08/13/2023 0816   RDW 12.6 11/19/2022 0905   LYMPHSABS 2.6 03/04/2016 2336   MONOABS 0.6 03/04/2016 2336   EOSABS 0.2 03/04/2016 2336   BASOSABS  0.1 03/04/2016 2336    Imaging reviewed.     No data to display           Assessment & Plan:  [name] is a pleasant 73 year old male patient with a past medical history of prostate cancer diagnosed in January 2025 status post radiation, left upper lobe nodule status post robotic assisted navigational bronchoscopy which was negative for malignancy, COPD with emphysema presenting today to the pulmonary clinic for follow-up visit.  # COPD stage III based based on spirometry in 2018 with emphysema and chronic bronchitis.   []  PFTs  []  Azithromycin  250mg  1 tab for 5 days []  C/w fluticasone-vilanterol-umecledinium [Trelegy Ellipta ] 100 1puff daily.  []  C/w Xopenex  as needed.   []  Amb referral to pulmonary rehab.   #LUL pulmonary nodule  #RUL new Nodule (Most likely infectious) S/p Nav Bronch 07/2023, negative for maligancy. Decreasing in size on most recent CT chest 10/2023. Likely a foci of mucous plugging.   []  Repeat CT chest in 6 monhts and if reassuring, can resume yearly LDCtT.   #Prostate cancer s.p radiation.   Return in about 3 months (around 02/17/2024).  I spent 60 minutes caring for this patient today, including preparing to see the patient, obtaining a medical history , reviewing a separately obtained history, performing a medically appropriate examination and/or evaluation, counseling and educating the patient/family/caregiver, ordering medications, tests, or procedures, documenting clinical information in the electronic health record, and independently interpreting results (not separately reported/billed) and communicating results to the patient/family/caregiver  Darrin Barn, MD [facility] 11/18/2023 10:43 AM

## 2023-11-19 ENCOUNTER — Telehealth (HOSPITAL_COMMUNITY): Payer: Self-pay

## 2023-11-19 ENCOUNTER — Ambulatory Visit (INDEPENDENT_AMBULATORY_CARE_PROVIDER_SITE_OTHER)

## 2023-11-19 ENCOUNTER — Encounter (HOSPITAL_COMMUNITY): Payer: Self-pay

## 2023-11-19 VITALS — Ht 73.0 in | Wt 169.0 lb

## 2023-11-19 DIAGNOSIS — Z0000 Encounter for general adult medical examination without abnormal findings: Secondary | ICD-10-CM

## 2023-11-19 NOTE — Patient Instructions (Signed)
 Mr. Papillion , Thank you for taking time out of your busy schedule to complete your Annual Wellness Visit with me. I enjoyed our conversation and look forward to speaking with you again next year. I, as well as your care team,  appreciate your ongoing commitment to your health goals. Please review the following plan we discussed and let me know if I can assist you in the future. Your Game plan/ To Do List    Referrals: If you haven't heard from the office you've been referred to, please reach out to them at the phone provided.   Follow up Visits: We will see or speak with you next year for your Next Medicare AWV with our clinical staff Have you seen your provider in the last 6 months (3 months if uncontrolled diabetes)? No  Clinician Recommendations:  Aim for 30 minutes of exercise or brisk walking, 6-8 glasses of water, and 5 servings of fruits and vegetables each day. Educated and advised on getting      This is a list of the screenings recommended for you:  Health Maintenance  Topic Date Due   Zoster (Shingles) Vaccine (1 of 2) Never done   Flu Shot  10/16/2023   COVID-19 Vaccine (4 - 2025-26 season) 11/16/2023   DTaP/Tdap/Td vaccine (2 - Td or Tdap) 02/28/2024   Screening for Lung Cancer  08/11/2024   Medicare Annual Wellness Visit  11/18/2024   Colon Cancer Screening  08/11/2027   Pneumococcal Vaccine for age over 66  Completed   Hepatitis C Screening  Completed   HPV Vaccine  Aged Out   Meningitis B Vaccine  Aged Out    Advanced directives: (Copy Requested) Please bring a copy of your health care power of attorney and living will to the office to be added to your chart at your convenience. You can mail to [facility] [address] [place], [place] [address] or email to [email or web address] Advance Care Planning is important because it:  [x]  Makes sure you receive the medical care that is consistent with your values, goals, and preferences  [x]  It provides  guidance to your family and loved ones and reduces their decisional burden about whether or not they are making the right decisions based on your wishes.  Follow the link provided in your after visit summary or read over the paperwork we have mailed to you to help you started getting your Advance Directives in place. If you need assistance in completing these, please reach out to us  so that we can help you!

## 2023-11-19 NOTE — Telephone Encounter (Signed)
 Pt insurance is active and benefits verified through [employer]. Co-pay $5.00, DED 0/0 met, out of pocket $3,400/$3,400 met, co-insurance 0%. pre-authorization required for [id number] ONLY, Kim/[employer] 11/19/2023@2 :05, REF# [id number]

## 2023-11-19 NOTE — Progress Notes (Signed)
 Subjective:  Please attest and cosign this visit due to patients primary care provider not being in the office at the time the visit was completed.  (Pt of Dr Almarie Cleveland)   [name] is a 73 y.o. who presents for a Medicare Wellness preventive visit.  As a reminder, Annual Wellness Visits don't include a physical exam, and some assessments may be limited, especially if this visit is performed virtually. We may recommend an in-person follow-up visit with your provider if needed.  Visit Complete: Virtual I connected with  [name] on 11/19/23 by a audio enabled telemedicine application and verified that I am speaking with the correct person using two identifiers.  Patient Location: Home  Provider Location: Office/Clinic  I discussed the limitations of evaluation and management by telemedicine. The patient expressed understanding and agreed to proceed.  Vital Signs: Because this visit was a virtual/telehealth visit, some criteria may be missing or patient reported. Any vitals not documented were not able to be obtained and vitals that have been documented are patient reported.  VideoDeclined- This patient declined Librarian, academic. Therefore the visit was completed with audio only.  Persons Participating in Visit: Patient.  AWV Questionnaire: No: Patient Medicare AWV questionnaire was not completed prior to this visit.  Cardiac Risk Factors include: advanced age (>19men, >28 women);dyslipidemia;male gender;hypertension     Objective:    Today's Vitals   11/19/23 0812  Weight: 169 lb (76.7 kg)  Height: 6' 1 (1.854 m)   Body mass index is 22.3 kg/m.     11/19/2023    8:12 AM 08/28/2023    6:54 AM 08/13/2023    8:02 AM 06/11/2023   10:55 AM 04/28/2023    2:00 PM 05/15/2020    7:21 AM 05/03/2020    8:44 AM  Advanced Directives  Does Patient Have a Medical Advance Directive? Yes Yes Yes Yes Yes Yes Yes  Type of Special educational needs teacher of [place];Living will Healthcare Power of [place];Living will Healthcare Power of [place];Living will Living will Healthcare Power of [place];Living will Healthcare Power of [place];Living will Healthcare Power of [place];Living will  Does patient want to make changes to medical advance directive?    No - Patient declined  No - Patient declined   Copy of Healthcare Power of Attorney in Chart? No - copy requested No - copy requested No - copy requested   No - copy requested     Current Medications (verified) Outpatient Encounter Medications as of 11/19/2023  Medication Sig   albuterol  (VENTOLIN  HFA) 108 (90 Base) MCG/ACT inhaler INHALE TWO PUFFS BY MOUTH INTO LUNGS every FOUR hours AS NEEDED FOR WHEEZING AND/OR SHORTNESS OF BREATH   albuterol  (VENTOLIN  HFA) 108 (90 Base) MCG/ACT inhaler Inhale 2 puffs into the lungs every 6 (six) hours as needed for wheezing or shortness of breath.   amiodarone  (PACERONE ) 200 MG tablet TAKE 1/2 TABLET BY MOUTH ONCE DAILY   atorvastatin  (LIPITOR) 40 MG tablet Take one tablet by mouth once daily   b complex vitamins capsule Take 1 capsule by mouth daily.   Fluticasone-Umeclidin-Vilant (TRELEGY ELLIPTA ) 100-62.5-25 MCG/ACT AEPB Inhale 1 puff into the lungs daily.   lisinopril  (ZESTRIL ) 5 MG tablet Take 1 tablet (5 mg total) by mouth daily.   metoprolol  succinate (TOPROL -XL) 25 MG 24 hr tablet Take 1 tablet (25 mg total) by mouth daily.   Multiple Vitamin (MULTIVITAMIN WITH MINERALS) TABS tablet Take 1 tablet by mouth daily.   rivaroxaban  (XARELTO )  20 MG TABS tablet Take 1 tablet (20 mg total) by mouth daily.   azithromycin  (ZITHROMAX ) 250 MG tablet Take 1 tablet (250 mg total) by mouth daily. (Patient not taking: Reported on 11/19/2023)   predniSONE  (DELTASONE ) 10 MG tablet Prednisone  taper; 10 mg tablets: 4 tabs x 2 days, 3 tabs x 2 days, 2 tabs x 2 days 1 tab x 2 days then stop. (Patient not taking: Reported on 11/19/2023)   No  facility-administered encounter medications on file as of 11/19/2023.    Allergies (verified) Cialis [tadalafil], Levitra [vardenafil], and Viagra [sildenafil citrate]   History: Past Medical History:  Diagnosis Date   Adenomatous colon polyp    Alcohol use    Antiphospholipid antibody positive    ???In the past???but not proven according to patient   Antiphospholipid antibody positive    ??? in the past, but not proven according to the patient.   Atrial flutter (HCC)    atrial flutter ablation,   Dr.Allred October 18, 2010   BPH (benign prostatic hypertrophy)    CAD (coronary artery disease)    Catheterization 2005 disease / catheterization July 06, 2010, chronic total occlusion distal RCA with collaterals from right and left side.  Medical therapy recommended, mild decreased LV function        Carotid artery disease (HCC)    0-39% R. ICA, 40-59% LICA... Doppler.... January, 2010  /  Doppler... in January, 2011 no change  /  doppler1/27/2012...stable  0-39% RICA 40-59% LICA   CHF (congestive heart failure) (HCC)    COPD (chronic obstructive pulmonary disease) (HCC)    CRAO (central retinal artery occlusion)    Ejection fraction     EF 50-55%, echo, March, 2012... inferobasal and distal septal hypokinesis /      60%, echo, November, 2008   Emphysema of lung [facility])    GERD (gastroesophageal reflux disease)    Hypercholesterolemia    Hypertension    Myocardial infarction (HCC)    Nonsustained ventricular tachycardia (HCC)    Palpitations    probable SVT, rate 170,, at home,, lasted one hour,, 2012   Prostate cancer (HCC)    Raynaud's syndrome    Retinal artery occlusion    Stroke (HCC)    mild stroke per pt    TIA (transient ischemic attack)    Possible small vessel tia in the past.   Tobacco abuse    Past Surgical History:  Procedure Laterality Date   BRONCHIAL BRUSHINGS  08/13/2023   Procedure: BRONCHOSCOPY, WITH BRUSH BIOPSY;  Surgeon: Malka Domino, MD;  Location:  [facility] ENDOSCOPY;  Service: Pulmonary;;   BRONCHIAL NEEDLE ASPIRATION BIOPSY  08/13/2023   Procedure: BRONCHOSCOPY, WITH NEEDLE ASPIRATION BIOPSY;  Surgeon: Malka Domino, MD;  Location: [facility];  Service: Pulmonary;;   BRONCHIAL WASHINGS  08/13/2023   Procedure: IRRIGATION, BRONCHUS;  Surgeon: Malka Domino, MD;  Location: [facility] ENDOS[facility];  Service: Pulmonary;;   BRONCHOSCOPY, WITH BIOPSY USING ELECTROMAGNETIC NAVIGATION Bilateral 08/13/2023   Procedure: ROBOTIC ASSISTED NAVIGATIONAL BRONCHOSCOPY;  Surgeon: Malka Domino, MD;  Location: [facility] ENDOSCOPY;  Service: Pulmonary;  Laterality: Bilateral;   CARDIAC CATHETERIZATION  06/27/2003   2005.... mild irregularity of the LAD..( patient had had abnormal Myoview scan)   CARDIOVERSION N/A 03/06/2016   Procedure: CARDIOVERSION;  Surgeon: Maude JAYSON Emmer, MD;  Location[facility]OSCOPY;  Service: Cardiovascular;  Laterality: N/A;   COLONOSCOPY  08/10/2020   GOLD SEED IMPLANT N/A 08/28/2023   Procedure: INSERTION, GOLD SEEDS;  Surgeon: Cam Morene ORN, MD;  Location: [facility]  OR;  Service: Urology;  Laterality: N/A;   INGUINAL HERNIA REPAIR  03/18/2007   right with mesh.  Dr Curvin   KNEE SURGERY Right 02/2022   MELANOMA EXCISION     Will be scheduled for surgical removal on 05/25/2023 (Right Ear)   SPACE OAR INSTILLATION N/A 08/28/2023   Procedure: INJECTION, HYDROGEL SPACER;  Surgeon: Cam Morene ORN, MD;  Location: [facility] OR;  Service: Urology;  Laterality: N/A;   TEE WITHOUT CARDIOVERSION N/A 03/06/2016   Procedure: TRANSESOPHAGEAL ECHOCARDIOGRAM (TEE);  Surgeon: Maude JAYSON Emmer, MD;  Location: [facility] ENDOSCOPY;  Service: Cardiovascular;  Laterality: N/A;   THYROGLOSSAL DUCT CYST  03/17/2004   Dr. Mable   TRANSURETHRAL RESECTION OF PROSTATE N/A 05/15/2020   Procedure: TRANSURETHRAL RESECTION OF THE PROSTATE (TURP);  Surgeon: Watt Rush, MD;  Location: [facility] ORS;  Service: Urology;  Laterality: N/A;   VIDEO BRONCHOSCOPY WITH RADIAL  ENDOBRONCHIAL ULTRASOUND  08/13/2023   Procedure: VIDEO BRONCHOSCOPY WITH RADIAL ENDOBRONCHIAL ULTRASOUND;  Surgeon: Malka Domino, MD;  Location: [facility] ENDOSCOPY;  Service: Pulmonary;;   Family History  Adopted: Yes  Family history unknown: Yes   Social History   Socioeconomic History   Marital status: Married    Spouse name: Not on file   Number of children: Not on file   Years of education: Not on file   Highest education level: Not on file  Occupational History   Occupation: land Administrator retired  Tobacco Use   Smoking status: Former    Current packs/day: 0.00    Average packs/day: 0.5 packs/day for 54.0 years (27.0 ttl pk-yrs)    Types: Cigarettes    Start date: 36    Quit date: 03/17/2022    Years since quitting: 1.6   Smokeless tobacco: Never   Tobacco comments:    11/30/19 .5-1pack per day  Vaping Use   Vaping status: Never Used  Substance and Sexual Activity   Alcohol use: Yes    Alcohol/week: 14.0 - 21.0 standard drinks of alcohol    Types: 14 - 21 Cans of beer per week   Drug use: No   Sexual activity: Not Currently  Other Topics Concern   Not on file  Social History Narrative   Married   Social Drivers of Health   Financial Resource Strain: Low Risk  (11/19/2023)   Overall Financial Resource Strain (CARDIA)    Difficulty of Paying Living Expenses: Not hard at all  Food Insecurity: No Food Insecurity (11/19/2023)   Hunger Vital Sign    Worried About Running Out of Food in the Last Year: Never true    Ran Out of Food in the Last Year: Never true  Transportation Needs: No Transportation Needs (11/19/2023)   PRAPARE - Administrator, Civil Service (Medical): No    Lack of Transportation (Non-Medical): No  Physical Activity: Insufficiently Active (11/19/2023)   Exercise Vital Sign    Days of Exercise per Week: 2 days    Minutes of Exercise per Session: 10 min  Stress: No Stress Concern Present (11/19/2023)   [employer] of Occupational  Health - Occupational Stress Questionnaire    Feeling of Stress: Only a little  Social Connections: Moderately Isolated (11/19/2023)   Social Connection and Isolation Panel    Frequency of Communication with Friends and Family: More than three times a week    Frequency of Social Gatherings with Friends and Family: Three times a week    Attends Religious Services: Never    Active Member of Clubs  or Organizations: No    Attends Banker Meetings: Never    Marital Status: Married    Tobacco Counseling Counseling given: No Tobacco comments: 11/30/19 .5-1pack per day    Clinical Intake:  Pre-visit preparation completed: Yes  Pain : No/denies pain     BMI - recorded: 22.3 Nutritional Status: BMI of 19-24  Normal Nutritional Risks: None Diabetes: No  Lab Results  Component Value Date   HGBA1C 5.5 11/19/2022     How often do you need to have someone help you when you read instructions, pamphlets, or other written materials from your doctor or pharmacy?: 1 - Never  Interpreter Needed?: No  Information entered by :: [name], CMA   Activities of Daily Living     11/19/2023    8:16 AM 08/28/2023    6:57 AM  In your present state of health, do you have any difficulty performing the following activities:  Hearing? 0 0  Vision? 0 0  Difficulty concentrating or making decisions? 0 0  Walking or climbing stairs? 0   Dressing or bathing? 0   Doing errands, shopping? 0   Preparing Food and eating ? N   Using the Toilet? N   In the past six months, have you accidently leaked urine? N   Do you have problems with loss of bowel control? N   Managing your Medications? N   Managing your Finances? N   Housekeeping or managing your Housekeeping? N     Patient Care Team: Rollene Almarie LABOR, MD as PCP - General (Internal Medicine) Jeffrie Oneil BROCKS, MD as PCP - Cardiology (Cardiology) Hardy Lenis (Inactive) as Physician Assistant (Neurology) Sheldon Standing, MD as  Attending Physician (General Surgery) Jeffrie Oneil BROCKS, MD as Consulting Physician (Cardiology) Fate Morna SAILOR, [facility] (Inactive) as Pharmacist (Pharmacist) Vertell Pont, RN as Oncology Nurse Navigator Dunn, Maude POUR (Optometry)  I have updated your Care Teams any recent Medical Services you may have received from other providers in the past year.     Assessment:   This is a routine wellness examination for [name].  Hearing/Vision screen Hearing Screening - Comments:: Denies hearing difficulties   Vision Screening - Comments:: Wears rx glasses - up to date with routine eye exams with Dr Maude Bring   Goals Addressed               This Visit's Progress     Patient Stated (pt-stated)        Patient stated he is trying to gain strength and wants to walk more       Depression Screen     11/19/2023    8:18 AM 04/28/2023    1:37 PM 12/01/2022    9:28 AM 11/29/2021    9:50 AM 06/21/2020    8:03 AM 08/03/2018   11:19 AM 07/27/2017    8:35 AM  PHQ 2/9 Scores  PHQ - 2 Score 0 0 0 0 0 0 0  PHQ- 9 Score 1  0 0       Fall Risk     11/19/2023    8:17 AM 08/21/2023   11:22 AM 08/03/2023    9:53 AM 12/01/2022    9:28 AM 11/29/2021    9:50 AM  Fall Risk   Falls in the past year? 0 0 0 0 0  Number falls in past yr: 0 0 0 0 0  Injury with Fall? 0 0 0 0 0  Risk for fall due to : No Fall  Risks      Follow up Falls evaluation completed;Falls prevention discussed   Falls evaluation completed     MEDICARE RISK AT HOME:  Medicare Risk at Home Any stairs in or around the home?: Yes (outside) If so, are there any without handrails?: Yes Home free of loose throw rugs in walkways, pet beds, electrical cords, etc?: Yes Adequate lighting in your home to reduce risk of falls?: Yes Life alert?: No Use of a cane, walker or w/c?: No Grab bars in the bathroom?: No Shower chair or bench in shower?: Yes Elevated toilet seat or a handicapped toilet?: No  TIMED UP AND GO:  Was the test performed?   No  Cognitive Function: 6CIT completed        11/19/2023    8:21 AM  6CIT Screen  What Year? 0 points  What month? 0 points  What time? 0 points  Count back from 20 0 points  Months in reverse 0 points  Repeat phrase 0 points  Total Score 0 points    Immunizations Immunization History  Administered Date(s) Administered   Fluad Quad(high Dose 65+) 01/27/2019   INFLUENZA, HIGH DOSE SEASONAL PF 01/13/2018, 02/16/2023   PFIZER(Purple Top)SARS-COV-2 Vaccination 04/23/2019, 05/18/2019, 12/18/2019   Pneumococcal Conjugate-13 05/06/2016   Pneumococcal Polysaccharide-23 07/27/2017   Tdap 02/27/2014    Screening Tests Health Maintenance  Topic Date Due   Zoster Vaccines- Shingrix (1 of 2) Never done   INFLUENZA VACCINE  10/16/2023   COVID-19 Vaccine (4 - 2025-26 season) 11/16/2023   DTaP/Tdap/Td (2 - Td or Tdap) 02/28/2024   Lung Cancer Screening  08/11/2024   Medicare Annual Wellness (AWV)  11/18/2024   Colonoscopy  08/11/2027   Pneumococcal Vaccine: 50+ Years  Completed   Hepatitis C Screening  Completed   HPV VACCINES  Aged Out   Meningococcal B Vaccine  Aged Out    Health Maintenance  Health Maintenance Due  Topic Date Due   Zoster Vaccines- Shingrix (1 of 2) Never done   INFLUENZA VACCINE  10/16/2023   COVID-19 Vaccine (4 - 2025-26 season) 11/16/2023   Health Maintenance Items Addressed: 11/19/2023  Additional Screening:  Vision Screening: Recommended annual ophthalmology exams for early detection of glaucoma and other disorders of the eye. Would you like a referral to an eye doctor? No    Dental Screening: Recommended annual dental exams for proper oral hygiene  Community Resource Referral / Chronic Care Management: CRR required this visit?  No   CCM required this visit?  No   Plan:    I have personally reviewed and noted the following in the patient's chart:   Medical and social history Use of alcohol, tobacco or illicit drugs  Current medications  and supplements including opioid prescriptions. Patient is not currently taking opioid prescriptions. Functional ability and status Nutritional status Physical activity Advanced directives List of other physicians Hospitalizations, surgeries, and ER visits in previous 12 months Vitals Screenings to include cognitive, depression, and falls Referrals and appointments  In addition, I have reviewed and discussed with patient certain preventive protocols, quality metrics, and best practice recommendations. A written personalized care plan for preventive services as well as general preventive health recommendations were provided to patient.   [name], CMA   11/19/2023   After Visit Summary: (MyChart) Due to this being a telephonic visit, the after visit summary with patients personalized plan was offered to patient via MyChart   Notes: Nothing significant to report at this time.

## 2023-11-19 NOTE — Telephone Encounter (Signed)
 Called patient to see if he was interested in participating in the Pulmonary Rehab Program. Patient will come in for orientation on 9/10@9am  and will attend the 10:15 exercise class.   Sent package.

## 2023-11-24 ENCOUNTER — Telehealth (HOSPITAL_COMMUNITY): Payer: Self-pay

## 2023-11-24 ENCOUNTER — Ambulatory Visit
Admission: RE | Admit: 2023-11-24 | Discharge: 2023-11-24 | Disposition: A | Discharge status: Home / Self Care | Source: Ambulatory Visit | Attending: Radiation Oncology | Admitting: Radiation Oncology

## 2023-11-24 DIAGNOSIS — C61 Malignant neoplasm of prostate: Secondary | ICD-10-CM | POA: Insufficient documentation

## 2023-11-24 NOTE — Telephone Encounter (Signed)
 Called to confirm appt. Pt confirmed appt. Instructed pt on proper footwear. Gave directions along with department number.

## 2023-11-24 NOTE — Progress Notes (Signed)
  Radiation Oncology         [phone]) [phone] ________________________________  Name: [name] MRN: [id number]  Date of Service: 11/24/2023  DOB: 22-Apr-1950  Post Treatment Telephone Note   Diagnosis:  73 y.o. gentleman with stage T1c adenocarcinoma of the prostate with a Gleason's score of 3+4 and a PSA of 4.63   Pre Treatment IPSS Score: 15   The patient was available for call today.   Symptoms of fatigue have improved since completing therapy.  Symptoms of bladder changes have improved since completing therapy. Reports current symptoms include nocturia x 2.  Reports not on any alpha blockers.  Symptoms of bowel changes have improved since completing therapy.  No diarrhea/constipation reported.  Post Treatment IPSS Score: IPSS Questionnaire (AUA-7): Over the past month.   1)  How often have you had a sensation of not emptying your bladder completely after you finish urinating?  0 - Not at all  2)  How often have you had to urinate again less than two hours after you finished urinating? 0 - Not at all  3)  How often have you found you stopped and started again several times when you urinated?  0 - Not at all  4) How difficult have you found it to postpone urination?  2 - Less than half the time  5) How often have you had a weak urinary stream?  0 - Not at all  6) How often have you had to push or strain to begin urination?  0 - Not at all  7) How many times did you most typically get up to urinate from the time you went to bed until the time you got up in the morning?  2 - 2 times  Total score:  4. Which indicates mild symptoms  0-7 mildly symptomatic   8-19 moderately symptomatic   20-35 severely symptomatic   Patient has a scheduled follow up visit with his urologist, Dr. Cam, in November 2025. He was counseled that PSA levels will be drawn in the urology office, and was reassured that additional time is expected to improve bowel and bladder symptoms. He was encouraged to  call back with concerns or questions regarding radiation.

## 2023-11-25 ENCOUNTER — Encounter (HOSPITAL_COMMUNITY)
Admission: RE | Admit: 2023-11-25 | Discharge: 2023-11-25 | Disposition: A | Discharge status: Home / Self Care | Source: Ambulatory Visit | Attending: Pulmonary Disease | Admitting: Pulmonary Disease

## 2023-11-25 ENCOUNTER — Encounter (HOSPITAL_COMMUNITY): Payer: Self-pay

## 2023-11-25 VITALS — BP 126/72 | HR 61 | Ht 74.0 in | Wt 169.8 lb

## 2023-11-25 DIAGNOSIS — J432 Centrilobular emphysema: Secondary | ICD-10-CM | POA: Insufficient documentation

## 2023-11-25 NOTE — Progress Notes (Signed)
 [name] [name] 73 y.o. male Pulmonary Rehab Orientation Note This patient who was referred to Pulmonary Rehab by Dr. Malka with the diagnosis of Centrilobular emphysema arrived today in Cardiac and Pulmonary Rehab. He arrived ambulatory with normal gait. He does not carry portable oxygen. Per patient, [name] uses oxygen never. Color good, skin warm and dry. Patient is oriented to time and place. Patient's medical history, psychosocial health, and medications reviewed. Psychosocial assessment reveals patient lives with spouse. [name] is currently retired. Patient hobbies include yardwork and golfing. Patient reports his stress level islow. Areas of stress/anxiety include N/A. Patient does exhibit signs of depression. PHQ2/9 score 0/0. [name] shows good  coping skills with positive outlook on life. Offered emotional support and reassurance. Will continue to monitor. Physical assessment performed by Dennis Levin RN. Please see their orientation physical assessment note. [name] reports he  does take medications as prescribed. Patient states he  follows a low sodium  diet. The patient reports no specific efforts to gain or lose weight.. Patient's weight will be monitored closely. Demonstration and practice of PLB using pulse oximeter. [name] able to return demonstration satisfactorily. Safety and hand hygiene in the exercise area reviewed with patient. [name] voices understanding of the information reviewed. Department expectations discussed with patient and achievable goals were set. The patient shows enthusiasm about attending the program and we look forward to working with [name]. [name] completed a 6 min walk test today and is scheduled to begin exercise on 12/01/23 at 10:15 am.  [id number] [name] [name] Dennis Humphres, MS, ACSM-CEP

## 2023-11-25 NOTE — Progress Notes (Signed)
 Pulmonary Individual Treatment Plan  Patient Details  Name: [name] MRN: [id number] Date of Birth: 06/02/1950 Referring Provider:   Conrad Ports Pulmonary Rehab Walk Test from 11/25/2023 in [facility]  Referring Provider Assaker    Initial Encounter Date:  Flowsheet Row Pulmonary Rehab Walk Test from 11/25/2023 in [facility]  Date 11/25/23    Visit Diagnosis: Centrilobular emphysema (HCC)  Patient's Home Medications on Admission:   Current Outpatient Medications:    albuterol  (VENTOLIN  HFA) 108 (90 Base) MCG/ACT inhaler, INHALE TWO PUFFS BY MOUTH INTO LUNGS every FOUR hours AS NEEDED FOR WHEEZING AND/OR SHORTNESS OF BREATH, Disp: 8.5 g, Rfl: 2   albuterol  (VENTOLIN  HFA) 108 (90 Base) MCG/ACT inhaler, Inhale 2 puffs into the lungs every 6 (six) hours as needed for wheezing or shortness of breath., Disp: 8 g, Rfl: 6   amiodarone  (PACERONE ) 200 MG tablet, TAKE 1/2 TABLET BY MOUTH ONCE DAILY, Disp: 45 tablet, Rfl: 3   atorvastatin  (LIPITOR) 40 MG tablet, Take one tablet by mouth once daily, Disp: 90 tablet, Rfl: 3   azithromycin  (ZITHROMAX ) 250 MG tablet, Take 1 tablet (250 mg total) by mouth daily., Disp: 5 tablet, Rfl: 0   b complex vitamins capsule, Take 1 capsule by mouth daily., Disp: , Rfl:    Fluticasone-Umeclidin-Vilant (TRELEGY ELLIPTA ) 100-62.5-25 MCG/ACT AEPB, Inhale 1 puff into the lungs daily., Disp: 1 each, Rfl: 11   lisinopril  (ZESTRIL ) 5 MG tablet, Take 1 tablet (5 mg total) by mouth daily., Disp: 90 tablet, Rfl: 3   metoprolol  succinate (TOPROL -XL) 25 MG 24 hr tablet, Take 1 tablet (25 mg total) by mouth daily., Disp: 90 tablet, Rfl: 3   Multiple Vitamin (MULTIVITAMIN WITH MINERALS) TABS tablet, Take 1 tablet by mouth daily., Disp: 30 tablet, Rfl: 0   rivaroxaban  (XARELTO ) 20 MG TABS tablet, Take 1 tablet (20 mg total) by mouth daily., Disp: 90 tablet,  Rfl: 1   predniSONE  (DELTASONE ) 10 MG tablet, Prednisone  taper; 10 mg tablets: 4 tabs x 2 days, 3 tabs x 2 days, 2 tabs x 2 days 1 tab x 2 days then stop. (Patient not taking: Reported on 11/25/2023), Disp: 20 tablet, Rfl: 0  Past Medical History: Past Medical History:  Diagnosis Date   Adenomatous colon polyp    Alcohol use    Antiphospholipid antibody positive    ???In the past???but not proven according to patient   Antiphospholipid antibody positive    ??? in the past, but not proven according to the patient.   Atrial flutter (HCC)    atrial flutter ablation,   Dr.Allred October 18, 2010   BPH (benign prostatic hypertrophy)    CAD (coronary artery disease)    Catheterization 2005 disease / catheterization July 06, 2010, chronic total occlusion distal RCA with collaterals from right and left side.  Medical therapy recommended, mild decreased LV function        Carotid artery disease (HCC)    0-39% R. ICA, 40-59% LICA... Doppler.... January, 2010  /  Doppler... in January, 2011 no change  /  doppler1/27/2012...stable  0-39% RICA 40-59% LICA   CHF (congestive heart failure) (HCC)    COPD (chronic obstructive pulmonary disease) (HCC)    CRAO (central retinal artery occlusion)    Ejection fraction     EF 50-55%, echo, March, 2012... inferobasal and distal septal hypokinesis /      60%, echo, November, 2008  Emphysema of lung (HCC)    GERD (gastroesophageal reflux disease)    Hypercholesterolemia    Hypertension    Myocardial infarction (HCC)    Nonsustained ventricular tachycardia (HCC)    Palpitations    probable SVT, rate 170,, at home,, lasted one hour,, 2012   Prostate cancer (HCC)    Raynaud's syndrome    Retinal artery occlusion    Stroke (HCC)    mild stroke per pt    TIA (transient ischemic attack)    Possible small vessel tia in the past.   Tobacco abuse     Tobacco Use: Social History   Tobacco Use  Smoking Status Former   Current packs/day: 0.00   Average  packs/day: 0.5 packs/day for 54.0 years (27.0 ttl pk-yrs)   Types: Cigarettes   Start date: 1   Quit date: 03/17/2022   Years since quitting: 1.6  Smokeless Tobacco Never  Tobacco Comments   11/30/19 .5-1pack per day    Labs: Review Flowsheet  More data exists      Latest Ref Rng & Units 07/27/2017 08/18/2018 09/22/2019 11/11/2021 11/19/2022  Labs for ITP Cardiac and Pulmonary Rehab  Cholestrol 100 - 199 mg/dL 862  875  875  856  853   LDL (calc) 0 - 99 mg/dL 52  46  48  54  65   HDL-C >39 mg/dL 26.89  62  62  73  64   Trlycerides 0 - 149 mg/dL 38.9  81  66  83  89   Hemoglobin A1c 4.8 - 5.6 % - - - - 5.5     Capillary Blood Glucose: Lab Results  Component Value Date   GLUCAP 65 (L) 08/04/2023     Pulmonary Assessment Scores:  Pulmonary Assessment Scores     Row Name 11/25/23 0935         ADL UCSD   ADL Phase Entry     SOB Score total 62       CAT Score   CAT Score 22       mMRC Score   mMRC Score 3       UCSD: Self-administered rating of dyspnea associated with activities of daily living (ADLs) 6-point scale (0 = not at all to 5 = maximal or unable to do because of breathlessness)  Scoring Scores range from 0 to 120.  Minimally important difference is 5 units  CAT: CAT can identify the health impairment of COPD patients and is better correlated with disease progression.  CAT has a scoring range of zero to 40. The CAT score is classified into four groups of low (less than 10), medium (10 - 20), high (21-30) and very high (31-40) based on the impact level of disease on health status. A CAT score over 10 suggests significant symptoms.  A worsening CAT score could be explained by an exacerbation, poor medication adherence, poor inhaler technique, or progression of COPD or comorbid conditions.  CAT MCID is 2 points  mMRC: mMRC (Modified Medical Research Council) Dyspnea Scale is used to assess the degree of baseline functional disability in patients of respiratory  disease due to dyspnea. No minimal important difference is established. A decrease in score of 1 point or greater is considered a positive change.   Pulmonary Function Assessment:  Pulmonary Function Assessment - 11/25/23 0928       Breath   Shortness of Breath Yes;Limiting activity          Exercise Target Goals: Exercise Program Goal: Individual  exercise prescription set using results from initial 6 min walk test and THRR while considering  patient's activity barriers and safety.   Exercise Prescription Goal: Initial exercise prescription builds to 30-45 minutes a day of aerobic activity, 2-3 days per week.  Home exercise guidelines will be given to patient during program as part of exercise prescription that the participant will acknowledge.  Activity Barriers & Risk Stratification:  Activity Barriers & Cardiac Risk Stratification - 11/25/23 0938       Activity Barriers & Cardiac Risk Stratification   Activity Barriers Arthritis;Deconditioning;Muscular Weakness;Shortness of Breath          6 Minute Walk:  6 Minute Walk     Row Name 11/25/23 1036         6 Minute Walk   Phase Initial     Distance 1023 feet     Walk Time 6 minutes     # of Rest Breaks 0     MPH 1.94     METS 2.65     RPE 13     Perceived Dyspnea  1     VO2 Peak 9.27     Symptoms No     Resting HR 61 bpm     Resting BP 126/72     Resting Oxygen Saturation  97 %     Exercise Oxygen Saturation  during 6 min walk 93 %     Max Ex. HR 75 bpm     Max Ex. BP 144/70     2 Minute Post BP 132/70       Interval HR   1 Minute HR 65     2 Minute HR 70     3 Minute HR 73     4 Minute HR 73     5 Minute HR 75     6 Minute HR 75     2 Minute Post HR 61     Interval Heart Rate? Yes       Interval Oxygen   Interval Oxygen? Yes     Baseline Oxygen Saturation % 97 %     1 Minute Oxygen Saturation % 96 %     1 Minute Liters of Oxygen 0 L     2 Minute Oxygen Saturation % 94 %     2 Minute Liters of  Oxygen 0 L     3 Minute Oxygen Saturation % 93 %     3 Minute Liters of Oxygen 0 L     4 Minute Oxygen Saturation % 94 %     4 Minute Liters of Oxygen 0 L     5 Minute Oxygen Saturation % 94 %     5 Minute Liters of Oxygen 0 L     6 Minute Oxygen Saturation % 93 %     6 Minute Liters of Oxygen 0 L     2 Minute Post Oxygen Saturation % 98 %     2 Minute Post Liters of Oxygen 0 L        Oxygen Initial Assessment:  Oxygen Initial Assessment - 11/25/23 0928       Home Oxygen   Home Oxygen Device None    Sleep Oxygen Prescription None    Home Exercise Oxygen Prescription None    Home Resting Oxygen Prescription None      Initial 6 min Walk   Oxygen Used None      Program Oxygen Prescription   Program Oxygen Prescription None  Intervention   Short Term Goals To learn and understand importance of maintaining oxygen saturations>88%;To learn and demonstrate proper use of respiratory medications;To learn and understand importance of monitoring SPO2 with pulse oximeter and demonstrate accurate use of the pulse oximeter.;To learn and demonstrate proper pursed lip breathing techniques or other breathing techniques.     Long  Term Goals Verbalizes importance of monitoring SPO2 with pulse oximeter and return demonstration;Exhibits proper breathing techniques, such as pursed lip breathing or other method taught during program session;Maintenance of O2 saturations>88%;Compliance with respiratory medication;Demonstrates proper use of MDI's          Oxygen Re-Evaluation:   Oxygen Discharge (Final Oxygen Re-Evaluation):   Initial Exercise Prescription:  Initial Exercise Prescription - 11/25/23 1000       Date of Initial Exercise RX and Referring Provider   Date 11/25/23    Referring Provider Assaker    Expected Discharge Date 02/18/24      Recumbant Elliptical   Level 1    RPM 35    Watts 40    Minutes 15    METs 1.4      Track   Minutes 15    METs 2      Prescription  Details   Frequency (times per week) 2    Duration Progress to 30 minutes of continuous aerobic without signs/symptoms of physical distress      Intensity   THRR 40-80% of Max Heartrate 59-118    Ratings of Perceived Exertion 11-13    Perceived Dyspnea 0-4      Progression   Progression Continue to progress workloads to maintain intensity without signs/symptoms of physical distress.      Resistance Training   Training Prescription Yes    Weight red bands    Reps 10-15          Perform Capillary Blood Glucose checks as needed.  Exercise Prescription Changes:   Exercise Comments:   Exercise Goals and Review:   Exercise Goals     Row Name 11/25/23 0927             Exercise Goals   Increase Physical Activity Yes       Intervention Provide advice, education, support and counseling about physical activity/exercise needs.;Develop an individualized exercise prescription for aerobic and resistive training based on initial evaluation findings, risk stratification, comorbidities and participant's personal goals.       Expected Outcomes Short Term: Attend rehab on a regular basis to increase amount of physical activity.;Long Term: Add in home exercise to make exercise part of routine and to increase amount of physical activity.;Long Term: Exercising regularly at least 3-5 days a week.       Increase Strength and Stamina Yes       Intervention Provide advice, education, support and counseling about physical activity/exercise needs.;Develop an individualized exercise prescription for aerobic and resistive training based on initial evaluation findings, risk stratification, comorbidities and participant's personal goals.       Expected Outcomes Short Term: Increase workloads from initial exercise prescription for resistance, speed, and METs.;Short Term: Perform resistance training exercises routinely during rehab and add in resistance training at home;Long Term: Improve cardiorespiratory  fitness, muscular endurance and strength as measured by increased METs and functional capacity ( )       Able to understand and use rate of perceived exertion (RPE) scale Yes       Intervention Provide education and explanation on how to use RPE scale  Expected Outcomes Short Term: Able to use RPE daily in rehab to express subjective intensity level;Long Term:  Able to use RPE to guide intensity level when exercising independently       Able to understand and use Dyspnea scale Yes       Intervention Provide education and explanation on how to use Dyspnea scale       Expected Outcomes Short Term: Able to use Dyspnea scale daily in rehab to express subjective sense of shortness of breath during exertion;Long Term: Able to use Dyspnea scale to guide intensity level when exercising independently       Knowledge and understanding of Target Heart Rate Range (THRR) Yes       Intervention Provide education and explanation of THRR including how the numbers were predicted and where they are located for reference       Expected Outcomes Short Term: Able to state/look up THRR;Long Term: Able to use THRR to govern intensity when exercising independently;Short Term: Able to use daily as guideline for intensity in rehab       Understanding of Exercise Prescription Yes       Intervention Provide education, explanation, and written materials on patient's individual exercise prescription       Expected Outcomes Short Term: Able to explain program exercise prescription;Long Term: Able to explain home exercise prescription to exercise independently          Exercise Goals Re-Evaluation :   Discharge Exercise Prescription (Final Exercise Prescription Changes):   Nutrition:  Target Goals: Understanding of nutrition guidelines, daily intake of sodium 1500mg , cholesterol 200mg , calories 30% from fat and 7% or less from saturated fats, daily to have 5 or more servings of fruits and  vegetables.  Biometrics:  Pre Biometrics - 11/25/23 0921       Pre Biometrics   Grip Strength 30 kg           Nutrition Therapy Plan and Nutrition Goals:   Nutrition Assessments:  MEDIFICTS Score Key: >=70 Need to make dietary changes  40-70 Heart Healthy Diet <= 40 Therapeutic Level Cholesterol Diet   Picture Your Plate Scores: <59 Unhealthy dietary pattern with much room for improvement. 41-50 Dietary pattern unlikely to meet recommendations for good health and room for improvement. 51-60 More healthful dietary pattern, with some room for improvement.  >60 Healthy dietary pattern, although there may be some specific behaviors that could be improved.    Nutrition Goals Re-Evaluation:   Nutrition Goals Discharge (Final Nutrition Goals Re-Evaluation):   Psychosocial: Target Goals: Acknowledge presence or absence of significant depression and/or stress, maximize coping skills, provide positive support system. Participant is able to verbalize types and ability to use techniques and skills needed for reducing stress and depression.  Initial Review & Psychosocial Screening:  Initial Psych Review & Screening - 11/25/23 0931       Initial Review   Current issues with None Identified      Family Dynamics   Good Support System? Yes    Comments spouse and 2 children      Barriers   Psychosocial barriers to participate in program There are no identifiable barriers or psychosocial needs.      Screening Interventions   Interventions Encouraged to exercise          Quality of Life Scores:  Scores of 19 and below usually indicate a poorer quality of life in these areas.  A difference of  2-3 points is a clinically meaningful difference.  A difference  of 2-3 points in the total score of the Quality of Life Index has been associated with significant improvement in overall quality of life, self-image, physical symptoms, and general health in studies assessing change in  quality of life.  PHQ-9: Review Flowsheet  More data exists      11/25/2023 11/19/2023 04/28/2023 12/01/2022 11/29/2021  Depression screen PHQ 2/9  Decreased Interest 0 0 0 0 0  Down, Depressed, Hopeless 0 0 0 0 0  PHQ - 2 Score 0 0 0 0 0  Altered sleeping 0 0 - 0 0  Tired, decreased energy 0 0 - 0 0  Change in appetite 0 0 - 0 0  Feeling bad or failure about yourself  0 0 - 0 0  Trouble concentrating 0 0 - 0 0  Moving slowly or fidgety/restless 0 1 - 0 0  Suicidal thoughts 0 0 - 0 0  PHQ-9 Score 0 1 - 0 0  Difficult doing work/chores Not difficult at all Not difficult at all - Not difficult at all Not difficult at all   Interpretation of Total Score  Total Score Depression Severity:  1-4 = Minimal depression, 5-9 = Mild depression, 10-14 = Moderate depression, 15-19 = Moderately severe depression, 20-27 = Severe depression   Psychosocial Evaluation and Intervention:  Psychosocial Evaluation - 11/25/23 0932       Psychosocial Evaluation & Interventions   Interventions Encouraged to exercise with the program and follow exercise prescription    Comments Deatrice denies any psychosocial barriers at this time.    Expected Outcomes For Deatrice to participate in PR free of psychosocial barriers.    Continue Psychosocial Services  No Follow up required          Psychosocial Re-Evaluation:   Psychosocial Discharge (Final Psychosocial Re-Evaluation):   Education: Education Goals: Education classes will be provided on a weekly basis, covering required topics. Participant will state understanding/return demonstration of topics presented.  Learning Barriers/Preferences:  Learning Barriers/Preferences - 11/25/23 0934       Learning Barriers/Preferences   Learning Barriers Sight    Learning Preferences None          Education Topics: Know Your Numbers Group instruction that is supported by a PowerPoint presentation. Instructor discusses importance of knowing and understanding  resting, exercise, and post-exercise oxygen saturation, heart rate, and blood pressure. Oxygen saturation, heart rate, blood pressure, rating of perceived exertion, and dyspnea are reviewed along with a normal range for these values.    Exercise for the Pulmonary Patient Group instruction that is supported by a PowerPoint presentation. Instructor discusses benefits of exercise, core components of exercise, frequency, duration, and intensity of an exercise routine, importance of utilizing pulse oximetry during exercise, safety while exercising, and options of places to exercise outside of rehab.    MET Level  Group instruction provided by PowerPoint, verbal discussion, and written material to support subject matter. Instructor reviews what METs are and how to increase METs.    Pulmonary Medications Verbally interactive group education provided by instructor with focus on inhaled medications and proper administration.   Anatomy and Physiology of the Respiratory System Group instruction provided by PowerPoint, verbal discussion, and written material to support subject matter. Instructor reviews respiratory cycle and anatomical components of the respiratory system and their functions. Instructor also reviews differences in obstructive and restrictive respiratory diseases with examples of each.    Oxygen Safety Group instruction provided by PowerPoint, verbal discussion, and written material to support subject matter. There is an  overview of "What is Oxygen" and "Why do we need it".  Instructor also reviews how to create a safe environment for oxygen use, the importance of using oxygen as prescribed, and the risks of noncompliance. There is a brief discussion on traveling with oxygen and resources the patient may utilize.   Oxygen Use Group instruction provided by PowerPoint, verbal discussion, and written material to discuss how supplemental oxygen is prescribed and different types of oxygen  supply systems. Resources for more information are provided.    Breathing Techniques Group instruction that is supported by demonstration and informational handouts. Instructor discusses the benefits of pursed lip and diaphragmatic breathing and detailed demonstration on how to perform both.     Risk Factor Reduction Group instruction that is supported by a PowerPoint presentation. Instructor discusses the definition of a risk factor, different risk factors for pulmonary disease, and how the heart and lungs work together.   Pulmonary Diseases Group instruction provided by PowerPoint, verbal discussion, and written material to support subject matter. Instructor gives an overview of the different type of pulmonary diseases. There is also a discussion on risk factors and symptoms as well as ways to manage the diseases.   Stress and Energy Conservation Group instruction provided by PowerPoint, verbal discussion, and written material to support subject matter. Instructor gives an overview of stress and the impact it can have on the body. Instructor also reviews ways to reduce stress. There is also a discussion on energy conservation and ways to conserve energy throughout the day.   Warning Signs and Symptoms Group instruction provided by PowerPoint, verbal discussion, and written material to support subject matter. Instructor reviews warning signs and symptoms of stroke, heart attack, cold and flu. Instructor also reviews ways to prevent the spread of infection.   Other Education Group or individual verbal, written, or video instructions that support the educational goals of the pulmonary rehab program.    Knowledge Questionnaire Score:  Knowledge Questionnaire Score - 11/25/23 1105       Knowledge Questionnaire Score   Pre Score 15/18          Core Components/Risk Factors/Patient Goals at Admission:  Personal Goals and Risk Factors at Admission - 11/25/23 0934       Core  Components/Risk Factors/Patient Goals on Admission   Improve shortness of breath with ADL's Yes    Intervention Provide education, individualized exercise plan and daily activity instruction to help decrease symptoms of SOB with activities of daily living.    Expected Outcomes Short Term: Improve cardiorespiratory fitness to achieve a reduction of symptoms when performing ADLs;Long Term: Be able to perform more ADLs without symptoms or delay the onset of symptoms          Core Components/Risk Factors/Patient Goals Review:    Core Components/Risk Factors/Patient Goals at Discharge (Final Review):    ITP Comments: Dr. Slater Staff is Medical Director for Pulmonary Rehab at [facility].

## 2023-12-01 ENCOUNTER — Encounter (HOSPITAL_COMMUNITY)
Admission: RE | Admit: 2023-12-01 | Discharge: 2023-12-01 | Disposition: A | Discharge status: Home / Self Care | Source: Ambulatory Visit | Attending: Pulmonary Disease | Admitting: Pulmonary Disease

## 2023-12-01 VITALS — Wt 169.1 lb

## 2023-12-01 DIAGNOSIS — J432 Centrilobular emphysema: Secondary | ICD-10-CM

## 2023-12-01 NOTE — Progress Notes (Signed)
 Daily Session Note  Patient Details  Name: [name] MRN: [id number] Date of Birth: Sep 24, 1950 Referring Provider:   Conrad Ports Pulmonary Rehab Walk Test from 11/25/2023 in [facility] for Heart, Vascular, & Lung Health  Referring Provider Assaker    Encounter Date: 12/01/2023  Check In:  Session Check In - 12/01/23 1029       Check-In   Supervising physician immediately available to respond to emergencies [facility] MD immediately available    Physician(s) Rosabel Mose, NP    Location [facility]    Staff Present Ronal Levin, RN, Maud Moats, MS, ACSM-CEP, Exercise Physiologist;Casey Claudene, RT;Randi Midge BS, ACSM-CEP, Exercise Physiologist;Joseph Lennon, RN, BSN    Virtual Visit No    Medication changes reported     No    Fall or balance concerns reported    No    Tobacco Cessation No Change    Warm-up and Cool-down Performed as group-led instruction    Resistance Training Performed Yes    VAD Patient? No    PAD/SET Patient? No      Pain Assessment   Currently in Pain? No/denies    Multiple Pain Sites No          Capillary Blood Glucose: No results found for this or any previous visit (from the past 24 hours).   Exercise Prescription Changes - 12/01/23 1200       Response to Exercise   Blood Pressure (Admit) 130/66    Blood Pressure (Exercise) 174/80    Blood Pressure (Exit) 104/64    Heart Rate (Admit) 60 bpm    Heart Rate (Exercise) 73 bpm    Heart Rate (Exit) 62 bpm    Oxygen Saturation (Admit) 99 %    Oxygen Saturation (Exercise) 93 %    Oxygen Saturation (Exit) 97 %    Rating of Perceived Exertion (Exercise) 11    Perceived Dyspnea (Exercise) 1    Duration Progress to 30 minutes of  aerobic without signs/symptoms of physical distress    Intensity THRR unchanged      Resistance Training   Training Prescription Yes    Weight red bands    Reps 10-15    Time 10 Minutes      Recumbant Elliptical   Level 1     Minutes 15    METs 2      Track   Laps 13    Minutes 15    METs 2.62          Social History   Tobacco Use  Smoking Status Former   Current packs/day: 0.00   Average packs/day: 0.5 packs/day for 54.0 years (27.0 ttl pk-yrs)   Types: Cigarettes   Start date: 67   Quit date: 03/17/2022   Years since quitting: 1.7  Smokeless Tobacco Never  Tobacco Comments   11/30/19 .5-1pack per day    Goals Met:  Exercise tolerated well No report of concerns or symptoms today Strength training completed today  Goals Unmet:  Not Applicable  Comments: Service time is from 1017 to 1137    Dr. Slater Staff is Medical Director for Pulmonary Rehab at [facility].

## 2023-12-03 ENCOUNTER — Encounter (HOSPITAL_COMMUNITY)
Admission: RE | Admit: 2023-12-03 | Discharge: 2023-12-03 | Disposition: A | Discharge status: Home / Self Care | Source: Ambulatory Visit | Attending: Pulmonary Disease | Admitting: Pulmonary Disease

## 2023-12-03 DIAGNOSIS — J432 Centrilobular emphysema: Secondary | ICD-10-CM

## 2023-12-03 NOTE — Progress Notes (Signed)
 Daily Session Note  Patient Details  Name: [name] MRN: [id number] Date of Birth: 10/26/1950 Referring Provider:   Conrad Ports Pulmonary Rehab Walk Test from 11/25/2023 in [facility] for Heart, Vascular, & Lung Health  Referring Provider Assaker    Encounter Date: 12/03/2023  Check In:  Session Check In - 12/03/23 1031       Check-In   Supervising physician immediately available to respond to emergencies [facility] immediately available    Physician(s) Damien Braver, NP    [facility]    Staff Present Ronal Levin, RN, Maud Moats, MS, ACSM-CEP, Exercise Physiologist;Marissah Vandemark Claudene Idelia Aris BS, ACSM-CEP, Exercise Physiologist    Virtual Visit No    Medication changes reported     No    Fall or balance concerns reported    No    Tobacco Cessation No Change    Warm-up and Cool-down Performed as group-led instruction    Resistance Training Performed Yes    VAD Patient? No    PAD/SET Patient? No      Pain Assessment   Currently in Pain? No/denies          Capillary Blood Glucose: No results found for this or any previous visit (from the past 24 hours).    Social History   Tobacco Use  Smoking Status Former   Current packs/day: 0.00   Average packs/day: 0.5 packs/day for 54.0 years (27.0 ttl pk-yrs)   Types: Cigarettes   Start date: 64   Quit date: 03/17/2022   Years since quitting: 1.7  Smokeless Tobacco Never  Tobacco Comments   11/30/19 .5-1pack per day    Goals Met:  Proper associated with RPD/PD & O2 Sat Independence with exercise equipment Exercise tolerated well No report of concerns or symptoms today Strength training completed today  Goals Unmet:  Not Applicable  Comments: Service time is from 1010 to 1150.    Dr. Slater Staff is Medical Director for Pulmonary Rehab at [facility].

## 2023-12-08 ENCOUNTER — Ambulatory Visit: Admitting: Internal Medicine

## 2023-12-08 ENCOUNTER — Encounter (HOSPITAL_COMMUNITY)
Admission: RE | Admit: 2023-12-08 | Discharge: 2023-12-08 | Disposition: A | Discharge status: Home / Self Care | Source: Ambulatory Visit | Attending: Pulmonary Disease | Admitting: Pulmonary Disease

## 2023-12-08 ENCOUNTER — Ambulatory Visit: Payer: Self-pay | Admitting: Internal Medicine

## 2023-12-08 ENCOUNTER — Encounter: Payer: Self-pay | Admitting: Internal Medicine

## 2023-12-08 VITALS — BP 138/74 | HR 82 | Temp 98.4°F | Resp 18 | Ht 72.75 in | Wt 167.2 lb

## 2023-12-08 DIAGNOSIS — Z0000 Encounter for general adult medical examination without abnormal findings: Secondary | ICD-10-CM | POA: Diagnosis not present

## 2023-12-08 DIAGNOSIS — I5022 Chronic systolic (congestive) heart failure: Secondary | ICD-10-CM | POA: Diagnosis not present

## 2023-12-08 DIAGNOSIS — I4811 Longstanding persistent atrial fibrillation: Secondary | ICD-10-CM

## 2023-12-08 DIAGNOSIS — I6523 Occlusion and stenosis of bilateral carotid arteries: Secondary | ICD-10-CM | POA: Diagnosis not present

## 2023-12-08 DIAGNOSIS — J432 Centrilobular emphysema: Secondary | ICD-10-CM | POA: Diagnosis not present

## 2023-12-08 DIAGNOSIS — D6869 Other thrombophilia: Secondary | ICD-10-CM | POA: Diagnosis not present

## 2023-12-08 DIAGNOSIS — I10 Essential (primary) hypertension: Secondary | ICD-10-CM | POA: Diagnosis not present

## 2023-12-08 DIAGNOSIS — E782 Mixed hyperlipidemia: Secondary | ICD-10-CM | POA: Diagnosis not present

## 2023-12-08 DIAGNOSIS — E041 Nontoxic single thyroid nodule: Secondary | ICD-10-CM

## 2023-12-08 DIAGNOSIS — Z8546 Personal history of malignant neoplasm of prostate: Secondary | ICD-10-CM

## 2023-12-08 DIAGNOSIS — F1010 Alcohol abuse, uncomplicated: Secondary | ICD-10-CM

## 2023-12-08 LAB — COMPREHENSIVE METABOLIC PANEL WITH GFR
ALT: 24 U/L (ref 0–53)
AST: 26 U/L (ref 0–37)
Albumin: 4.6 g/dL (ref 3.5–5.2)
Alkaline Phosphatase: 70 U/L (ref 39–117)
BUN: 15 mg/dL (ref 6–23)
CO2: 26 meq/L (ref 19–32)
Calcium: 9.9 mg/dL (ref 8.4–10.5)
Chloride: 100 meq/L (ref 96–112)
Creatinine, Ser: 1.19 mg/dL (ref 0.40–1.50)
GFR: 61.09 mL/min (ref >60.00)
Glucose, Bld: 120 mg/dL — ABNORMAL HIGH (ref 70–99)
Potassium: 4.7 meq/L (ref 3.5–5.1)
Sodium: 136 meq/L (ref 135–145)
Total Bilirubin: 0.5 mg/dL (ref 0.2–1.2)
Total Protein: 7.2 g/dL (ref 6.0–8.3)

## 2023-12-08 LAB — CBC
HCT: 46.9 % (ref 39.0–52.0)
Hemoglobin: 15.6 g/dL (ref 13.0–17.0)
MCHC: 33.2 g/dL (ref 30.0–36.0)
MCV: 96.2 fl (ref 78.0–100.0)
Platelets: 262 K/uL (ref 150.0–400.0)
RBC: 4.88 Mil/uL (ref 4.22–5.81)
RDW: 13.7 % (ref 11.5–15.5)
WBC: 6.4 K/uL (ref 4.0–10.5)

## 2023-12-08 LAB — TSH: TSH: 2.61 u[IU]/mL (ref 0.35–5.50)

## 2023-12-08 LAB — HEMOGLOBIN A1C: Hgb A1c MFr Bld: 5.7 % (ref 4.6–6.5)

## 2023-12-08 LAB — LIPID PANEL
Cholesterol: 133 mg/dL (ref 0–200)
HDL: 66.4 mg/dL (ref >39.00)
LDL Cholesterol: 51 mg/dL (ref 0–99)
NonHDL: 66.51
Total CHOL/HDL Ratio: 2
Triglycerides: 80 mg/dL (ref 0.0–149.0)
VLDL: 16 mg/dL (ref 0.0–40.0)

## 2023-12-08 NOTE — Assessment & Plan Note (Signed)
BP at goal on regimen. Checking CMP and adjust as needed.

## 2023-12-08 NOTE — Assessment & Plan Note (Signed)
 Under monitoring and s/p seed implant earlier this year. With some increased frequency of urination.

## 2023-12-08 NOTE — Progress Notes (Signed)
 Daily Session Note  Patient Details  Name: [name] MRN: [id number] Date of Birth: [birth year] Referring Provider:   Conrad Ports Pulmonary Rehab Walk Test from 11/25/2023 in [facility] for Heart, Vascular, & Lung Health  Referring Provider Assaker    Encounter Date: 12/08/2023  Check In:  Session Check In - 12/08/23 1333       Check-In   Supervising physician immediately available to respond to emergencies [facility] immediately available    Physician(s) Damien Braver, NP    [facility]    Staff Present Ronal Levin, RN, Maud Moats, MS, ACSM-CEP, Exercise Physiologist;Casey Claudene Idelia Aris BS, ACSM-CEP, Exercise Physiologist    Virtual Visit No    Medication changes reported     No    Fall or balance concerns reported    No    Tobacco Cessation No Change    Warm-up and Cool-down Performed as group-led instruction    Resistance Training Performed Yes    VAD Patient? No    PAD/SET Patient? No      Pain Assessment   Currently in Pain? No/denies    Multiple Pain Sites No          Capillary Blood Glucose: No results found for this or any previous visit (from the past 24 hours).    Social History   Tobacco Use  Smoking Status Former   Current packs/day: 0.00   Average packs/day: 0.5 packs/day for 54.0 years (27.0 ttl pk-yrs)   Types: Cigarettes   Start date: 15   Quit date: 03/17/2022   Years since quitting: 1.7  Smokeless Tobacco Never  Tobacco Comments   11/30/19 .5-1pack per day    Goals Met:  Exercise tolerated well No report of concerns or symptoms today Strength training completed today  Goals Unmet:  Not Applicable  Comments: Service time is from 1316 to 1442    Dr. Slater Staff is Medical Director for Pulmonary Rehab at [facility].

## 2023-12-08 NOTE — Assessment & Plan Note (Signed)
 Flu shot yearly. Pneumonia complete. Shingrix complete. Tetanus due this year. Colonoscopy up to date. Counseled about sun safety and mole surveillance. Counseled about the dangers of distracted driving. Given 10 year screening recommendations.

## 2023-12-08 NOTE — Assessment & Plan Note (Signed)
 Severe and currently using trelegy and in pulmonary rehab. Not smoking and reminded not to resume.

## 2023-12-08 NOTE — Assessment & Plan Note (Signed)
 Is on amiodarone  for rate control and sounds regular today. Taking metoprolol  as well and due for cardiology follow up advised to schedule. Is on eliquis for anticoagulation.

## 2023-12-08 NOTE — Assessment & Plan Note (Signed)
 No flare today. Continue metoprolol  and lisinopril .

## 2023-12-08 NOTE — Assessment & Plan Note (Signed)
 Stable intake around 2-3 drinks per day.

## 2023-12-08 NOTE — Progress Notes (Signed)
   Subjective:   Patient ID: [name], male    DOB: 09-21-1950, 73 y.o.   MRN: [id number]  The patient is here for physical. Pertinent topics discussed: Discussed the use of AI scribe software for clinical note transcription with the patient, who gave verbal consent to proceed.  History of Present Illness [name] is a 73 year old male with COPD who presents for follow-up on his pulmonary rehabilitation and general health.  He started pulmonary rehabilitation last week and finds it beneficial. He experiences shortness of breath with exertion, especially when walking uphill. He has quit smoking and has no intention of resuming. A recent CT scan showed a reduction in lung nodules, and a biopsy confirmed no cancer. He has been diagnosed with emphysema and COPD.  He has a history of prostate issues, including increased frequency of urination, attributed to past radiation treatment. He reports a gradual return of energy after significant fatigue during his treatment period.  He is aware of his atrial fibrillation and has not experienced any symptoms related to it recently. No chest pain, tightness, pressure, or heart racing.  No gastrointestinal symptoms such as diarrhea, constipation, or abdominal pain. Appetite and sleep patterns are stable. No headaches, vision changes, hearing changes, or skin issues. Plans to visit the dermatologist in the coming months for follow-up on previous skin lesions.  No changes in alcohol consumption. He has received several COVID-19 vaccinations and an RSV vaccine, and his pneumonia vaccinations are up to date. PMH, [facility], social history reviewed and updated  Review of Systems  Constitutional: Negative.   HENT: Negative.    Eyes: Negative.   Respiratory:  Negative for cough, chest tightness and shortness of breath.   Cardiovascular:  Negative for chest pain, palpitations and leg swelling.  Gastrointestinal:  Negative for abdominal distention,  abdominal pain, constipation, diarrhea, nausea and vomiting.  Musculoskeletal: Negative.   Skin: Negative.   Neurological: Negative.   Psychiatric/Behavioral: Negative.      Objective:  Physical Exam Constitutional:      Appearance: He is well-developed.  HENT:     Head: Normocephalic and atraumatic.  Cardiovascular:     Rate and Rhythm: Normal rate and regular rhythm.  Pulmonary:     Effort: Pulmonary effort is normal. No respiratory distress.     Breath sounds: Normal breath sounds. No wheezing or rales.  Abdominal:     General: Bowel sounds are normal. There is no distension.     Palpations: Abdomen is soft.     Tenderness: There is no abdominal tenderness.  Musculoskeletal:     Cervical back: Normal range of motion.  Skin:    General: Skin is warm and dry.  Neurological:     Mental Status: He is alert and oriented to person, place, and time.     Coordination: Coordination normal.     Vitals:   12/08/23 0916 12/08/23 0934  BP: (!) 148/74 138/74  Pulse: 82   Resp: 18   Temp: 98.4 F (36.9 C)   SpO2: 96%   Weight: 167 lb 3.2 oz (75.8 kg)   Height: 6' 0.75 (1.848 m)     Assessment & Plan:

## 2023-12-08 NOTE — Assessment & Plan Note (Signed)
 Due for repeat check Sept 2025 and due for cardiology follow up advised to call. On statin continue.

## 2023-12-08 NOTE — Assessment & Plan Note (Signed)
 ON eliquis and stable without signs of bleeding.

## 2023-12-08 NOTE — Patient Instructions (Signed)
 Call the cardiologist about your follow up.

## 2023-12-08 NOTE — Assessment & Plan Note (Signed)
Checking TSH and adjust as needed.  

## 2023-12-10 ENCOUNTER — Encounter (HOSPITAL_COMMUNITY)
Admission: RE | Admit: 2023-12-10 | Discharge: 2023-12-10 | Disposition: A | Discharge status: Home / Self Care | Source: Ambulatory Visit | Attending: Pulmonary Disease | Admitting: Pulmonary Disease

## 2023-12-10 DIAGNOSIS — J432 Centrilobular emphysema: Secondary | ICD-10-CM

## 2023-12-10 NOTE — Progress Notes (Signed)
 Daily Session Note  Patient Details  Name: [name] MRN: [id number] Date of Birth: Oct 03, 1950 Referring Provider:   Conrad Ports Pulmonary Rehab Walk Test from 11/25/2023 in [facility] for Heart, Vascular, & Lung Health  Referring Provider Assaker    Encounter Date: 12/10/2023  Check In:  Session Check In - 12/10/23 1027       Check-In   Supervising physician immediately available to respond to emergencies [facility] MD immediately available    Physician(s) Damien Braver, NP    [facility]    Staff Present Ronal Levin, RN, Maud Moats, MS, ACSM-CEP, Exercise Physiologist;Jacarie Pate Claudene Idelia Aris BS, ACSM-CEP, Exercise Physiologist    Virtual Visit No    Medication changes reported     No    Fall or balance concerns reported    No    Tobacco Cessation No Change    Warm-up and Cool-down Performed as group-led instruction    Resistance Training Performed Yes    VAD Patient? No    PAD/SET Patient? No      Pain Assessment   Currently in Pain? No/denies    Multiple Pain Sites No          Capillary Blood Glucose: No results found for this or any previous visit (from the past 24 hours).    Social History   Tobacco Use  Smoking Status Former   Current packs/day: 0.00   Average packs/day: 0.5 packs/day for 54.0 years (27.0 ttl pk-yrs)   Types: Cigarettes   Start date: 13   Quit date: 03/17/2022   Years since quitting: 1.7  Smokeless Tobacco Never  Tobacco Comments   11/30/19 .5-1pack per day    Goals Met:  Proper associated with RPD/PD & O2 Sat Independence with exercise equipment Exercise tolerated well No report of concerns or symptoms today Strength training completed today  Goals Unmet:  Not Applicable  Comments: Service time is from 1015 to 1145.    Dr. Slater Staff is Medical Director for Pulmonary Rehab at [facility].

## 2023-12-15 ENCOUNTER — Encounter (HOSPITAL_COMMUNITY)
Admission: RE | Admit: 2023-12-15 | Discharge: 2023-12-15 | Disposition: A | Source: Ambulatory Visit | Attending: Pulmonary Disease

## 2023-12-15 VITALS — Wt 169.8 lb

## 2023-12-15 DIAGNOSIS — J432 Centrilobular emphysema: Secondary | ICD-10-CM

## 2023-12-15 NOTE — Progress Notes (Signed)
 Daily Session Note  Patient Details  Name: [name] MRN: [id number] Date of Birth: 04/02/1950 Referring Provider:   Conrad Ports Pulmonary Rehab Walk Test from 11/25/2023 in [facility] for Heart, Vascular, & Lung Health  Referring Provider Assaker    Encounter Date: 12/15/2023  Check In:  Session Check In - 12/15/23 1029       Check-In   Supervising physician immediately available to respond to emergencies [facility] MD immediately available    Physician(s) Rosaline Skains, NP    Location [facility]    Staff Present Ronal Levin, RN, BSN;Casey Claudene, RT;Carlette Bernett, RN, Maud Moats, MS, ACSM-CEP, Exercise Physiologist    Virtual Visit No    Medication changes reported     No    Fall or balance concerns reported    No    Tobacco Cessation No Change    Warm-up and Cool-down Performed as group-led instruction    Resistance Training Performed Yes    VAD Patient? No    PAD/SET Patient? No      Pain Assessment   Currently in Pain? No/denies    Multiple Pain Sites No          Capillary Blood Glucose: No results found for this or any previous visit (from the past 24 hours).   Exercise Prescription Changes - 12/15/23 1200       Response to Exercise   Blood Pressure (Admit) 110/64    Blood Pressure (Exercise) 128/70    Blood Pressure (Exit) 98/56    Heart Rate (Admit) 58 bpm    Heart Rate (Exercise) 84 bpm    Heart Rate (Exit) 70 bpm    Oxygen Saturation (Admit) 97 %    Oxygen Saturation (Exercise) 95 %    Oxygen Saturation (Exit) 96 %    Rating of Perceived Exertion (Exercise) 11    Perceived Dyspnea (Exercise) 1    Duration Progress to 30 minutes of  aerobic without signs/symptoms of physical distress    Intensity THRR unchanged      Resistance Training   Training Prescription Yes    Weight red bands    Reps 10-15    Time 10 Minutes      Recumbant Elliptical   Level 2    Minutes 15    METs 2.5      Track    Laps 14    Minutes 15    METs 2.75          Social History   Tobacco Use  Smoking Status Former   Current packs/day: 0.00   Average packs/day: 0.5 packs/day for 54.0 years (27.0 ttl pk-yrs)   Types: Cigarettes   Start date: 27   Quit date: 03/17/2022   Years since quitting: 1.7  Smokeless Tobacco Never  Tobacco Comments   11/30/19 .5-1pack per day    Goals Met:  Independence with exercise equipment Exercise tolerated well No report of concerns or symptoms today Strength training completed today  Goals Unmet:  Not Applicable  Comments: Service time is from 1010 to 1136    Dr. Slater Staff is Medical Director for Pulmonary Rehab at [facility].

## 2023-12-16 NOTE — Progress Notes (Signed)
 Pulmonary Individual Treatment Plan  Patient Details  Name: [name] MRN: [id number] Date of Birth: 11-10-1950 Referring Provider:   Conrad Ports Pulmonary Rehab Walk Test from 11/25/2023 in [facility], Vascular, & Lung [facility]  Referring Provider Assaker    Initial Encounter Date:  Flowsheet Row Pulmonary Rehab Walk Test from 11/25/2023 in [facility] for Heart, Vascular, & Lung Health  Date 11/25/23    Visit Diagnosis: Centrilobular emphysema (HCC)  Patient's Home Medications on Admission:   Current Outpatient Medications:    albuterol  (VENTOLIN  HFA) 108 (90 Base) MCG/ACT inhaler, INHALE TWO PUFFS BY MOUTH INTO LUNGS every FOUR hours AS NEEDED FOR WHEEZING AND/OR SHORTNESS OF BREATH, Disp: 8.5 g, Rfl: 2   albuterol  (VENTOLIN  HFA) 108 (90 Base) MCG/ACT inhaler, Inhale 2 puffs into the lungs every 6 (six) hours as needed for wheezing or shortness of breath., Disp: 8 g, Rfl: 6   amiodarone  (PACERONE ) 200 MG tablet, TAKE 1/2 TABLET BY MOUTH ONCE DAILY, Disp: 45 tablet, Rfl: 3   atorvastatin  (LIPITOR) 40 MG tablet, Take one tablet by mouth once daily, Disp: 90 tablet, Rfl: 3   b complex vitamins capsule, Take 1 capsule by mouth daily., Disp: , Rfl:    Fluticasone-Umeclidin-Vilant (TRELEGY ELLIPTA ) 100-62.5-25 MCG/ACT AEPB, Inhale 1 puff into the lungs daily., Disp: 1 each, Rfl: 11   lisinopril  (ZESTRIL ) 5 MG tablet, Take 1 tablet (5 mg total) by mouth daily., Disp: 90 tablet, Rfl: 3   metoprolol  succinate (TOPROL -XL) 25 MG 24 hr tablet, Take 1 tablet (25 mg total) by mouth daily., Disp: 90 tablet, Rfl: 3   Multiple Vitamin (MULTIVITAMIN WITH MINERALS) TABS tablet, Take 1 tablet by mouth daily., Disp: 30 tablet, Rfl: 0   rivaroxaban  (XARELTO ) 20 MG TABS tablet, Take 1 tablet (20 mg total) by mouth daily., Disp: 90 tablet, Rfl: 1  Past Medical History: Past Medical History:  Diagnosis Date   Adenomatous colon polyp    Alcohol  use    Antiphospholipid antibody positive    ???In the past???but not proven according to patient   Antiphospholipid antibody positive    ??? in the past, but not proven according to the patient.   Atrial flutter (HCC)    atrial flutter ablation,   Dr.Allred October 18, 2010   BPH (benign prostatic hypertrophy)    CAD (coronary artery disease)    Catheterization 2005 disease / catheterization July 06, 2010, chronic total occlusion distal RCA with collaterals from right and left side.  Medical therapy recommended, mild decreased LV function        Carotid artery disease    0-39% R. ICA, 40-59% LICA... Doppler.... January, 2010  /  Doppler... in January, 2011 no change  /  doppler1/27/2012...stable  0-39% RICA 40-59% LICA   CHF (congestive heart failure) (HCC)    COPD (chronic obstructive pulmonary disease) (HCC)    CRAO (central retinal artery occlusion)    Ejection fraction     EF 50-55%, echo, March, 2012... inferobasal and distal septal hypokinesis /      60%, echo, November, 2008   Emphysema of lung [facility])    GERD (gastroesophageal reflux disease)    Hypercholesterolemia    Hypertension    Myocardial infarction (HCC)    Nonsustained ventricular tachycardia (HCC)    Palpitations    probable SVT, rate 170,, at home,, lasted one hour,, 2012   Prostate cancer (HCC)    Raynaud's syndrome    Retinal artery occlusion  Stroke [facility])    mild stroke per pt    TIA (transient ischemic attack)    Possible small vessel tia in the past.   Tobacco abuse     Tobacco Use: Social History   Tobacco Use  Smoking Status Former   Current packs/day: 0.00   Average packs/day: 0.5 packs/day for 54.0 years (27.0 ttl pk-yrs)   Types: Cigarettes   Start date: 17   Quit date: 03/17/2022   Years since quitting: 1.7  Smokeless Tobacco Never  Tobacco Comments   11/30/19 .5-1pack per day    Labs: Review Flowsheet  More data exists      Latest Ref Rng & Units 08/18/2018 09/22/2019 11/11/2021 11/19/2022  12/08/2023  Labs for ITP Cardiac and Pulmonary Rehab  Cholestrol 0 - 200 mg/dL 875  875  856  853  866   LDL (calc) 0 - 99 mg/dL 46  48  54  65  51   HDL-C >39.00 mg/dL 62  62  73  64  33.59   Trlycerides 0.0 - 149.0 mg/dL 81  66  83  89  19.9   Hemoglobin A1c 4.6 - 6.5 % - - - 5.5  5.7     Capillary Blood Glucose: Lab Results  Component Value Date   GLUCAP 65 (L) 08/04/2023     Pulmonary Assessment Scores:  Pulmonary Assessment Scores     Row Name 11/25/23 0935         ADL UCSD   ADL Phase Entry     SOB Score total 62       CAT Score   CAT Score 22       mMRC Score   mMRC Score 3       UCSD: Self-administered rating of dyspnea associated with activities of daily living (ADLs) 6-point scale (0 = not at all to 5 = maximal or unable to do because of breathlessness)  Scoring Scores range from 0 to 120.  Minimally important difference is 5 units  CAT: CAT can identify the health impairment of COPD patients and is better correlated with disease progression.  CAT has a scoring range of zero to 40. The CAT score is classified into four groups of low (less than 10), medium (10 - 20), high (21-30) and very high (31-40) based on the impact level of disease on health status. A CAT score over 10 suggests significant symptoms.  A worsening CAT score could be explained by an exacerbation, poor medication adherence, poor inhaler technique, or progression of COPD or comorbid conditions.  CAT MCID is 2 points  mMRC: mMRC (Modified Medical Research Council) Dyspnea Scale is used to assess the degree of baseline functional disability in patients of respiratory disease due to dyspnea. No minimal important difference is established. A decrease in score of 1 point or greater is considered a positive change.   Pulmonary Function Assessment:  Pulmonary Function Assessment - 11/25/23 0928       Breath   Shortness of Breath Yes;Limiting activity          Exercise Target  Goals: Exercise Program Goal: Individual exercise prescription set using results from initial 6 min walk test and THRR while considering  patient's activity barriers and safety.   Exercise Prescription Goal: Initial exercise prescription builds to 30-45 minutes a day of aerobic activity, 2-3 days per week.  Home exercise guidelines will be given to patient during program as part of exercise prescription that the participant will acknowledge.  Activity Barriers &  Risk Stratification:  Activity Barriers & Cardiac Risk Stratification - 11/25/23 0938       Activity Barriers & Cardiac Risk Stratification   Activity Barriers Arthritis;Deconditioning;Muscular Weakness;Shortness of Breath          6 Minute Walk:  6 Minute Walk     Row Name 11/25/23 1036         6 Minute Walk   Phase Initial     Distance 1023 feet     Walk Time 6 minutes     # of Rest Breaks 0     MPH 1.94     METS 2.65     RPE 13     Perceived Dyspnea  1     VO2 Peak 9.27     Symptoms No     Resting HR 61 bpm     Resting BP 126/72     Resting Oxygen Saturation  97 %     Exercise Oxygen Saturation  during 6 min walk 93 %     Max Ex. HR 75 bpm     Max Ex. BP 144/70     2 Minute Post BP 132/70       Interval HR   1 Minute HR 65     2 Minute HR 70     3 Minute HR 73     4 Minute HR 73     5 Minute HR 75     6 Minute HR 75     2 Minute Post HR 61     Interval Heart Rate? Yes       Interval Oxygen   Interval Oxygen? Yes     Baseline Oxygen Saturation % 97 %     1 Minute Oxygen Saturation % 96 %     1 Minute Liters of Oxygen 0 L     2 Minute Oxygen Saturation % 94 %     2 Minute Liters of Oxygen 0 L     3 Minute Oxygen Saturation % 93 %     3 Minute Liters of Oxygen 0 L     4 Minute Oxygen Saturation % 94 %     4 Minute Liters of Oxygen 0 L     5 Minute Oxygen Saturation % 94 %     5 Minute Liters of Oxygen 0 L     6 Minute Oxygen Saturation % 93 %     6 Minute Liters of Oxygen 0 L     2 Minute  Post Oxygen Saturation % 98 %     2 Minute Post Liters of Oxygen 0 L        Oxygen Initial Assessment:  Oxygen Initial Assessment - 11/25/23 0928       Home Oxygen   Home Oxygen Device None    Sleep Oxygen Prescription None    Home Exercise Oxygen Prescription None    Home Resting Oxygen Prescription None      Initial 6 min Walk   Oxygen Used None      Program Oxygen Prescription   Program Oxygen Prescription None      Intervention   Short Term Goals To learn and understand importance of maintaining oxygen saturations>88%;To learn and demonstrate proper use of respiratory medications;To learn and understand importance of monitoring SPO2 with pulse oximeter and demonstrate accurate use of the pulse oximeter.;To learn and demonstrate proper pursed lip breathing techniques or other breathing techniques.     Long  Term Goals Verbalizes importance  of monitoring SPO2 with pulse oximeter and return demonstration;Exhibits proper breathing techniques, such as pursed lip breathing or other method taught during program session;Maintenance of O2 saturations>88%;Compliance with respiratory medication;Demonstrates proper use of MDI's          Oxygen Re-Evaluation:  Oxygen Re-Evaluation     Row Name 12/04/23 1048             Program Oxygen Prescription   Program Oxygen Prescription None         Home Oxygen   Home Oxygen Device None       Sleep Oxygen Prescription None       Home Exercise Oxygen Prescription None       Home Resting Oxygen Prescription None         Goals/Expected Outcomes   Short Term Goals To learn and understand importance of maintaining oxygen saturations>88%;To learn and demonstrate proper use of respiratory medications;To learn and understand importance of monitoring SPO2 with pulse oximeter and demonstrate accurate use of the pulse oximeter.;To learn and demonstrate proper pursed lip breathing techniques or other breathing techniques.        Long  Term Goals  Verbalizes importance of monitoring SPO2 with pulse oximeter and return demonstration;Exhibits proper breathing techniques, such as pursed lip breathing or other method taught during program session;Maintenance of O2 saturations>88%;Compliance with respiratory medication;Demonstrates proper use of MDI's       Goals/Expected Outcomes Compliance and understanding of oxygen saturation monitoring and breathing techniques to decrease shortness of breath.          Oxygen Discharge (Final Oxygen Re-Evaluation):  Oxygen Re-Evaluation - 12/04/23 1048       Program Oxygen Prescription   Program Oxygen Prescription None      Home Oxygen   Home Oxygen Device None    Sleep Oxygen Prescription None    Home Exercise Oxygen Prescription None    Home Resting Oxygen Prescription None      Goals/Expected Outcomes   Short Term Goals To learn and understand importance of maintaining oxygen saturations>88%;To learn and demonstrate proper use of respiratory medications;To learn and understand importance of monitoring SPO2 with pulse oximeter and demonstrate accurate use of the pulse oximeter.;To learn and demonstrate proper pursed lip breathing techniques or other breathing techniques.     Long  Term Goals Verbalizes importance of monitoring SPO2 with pulse oximeter and return demonstration;Exhibits proper breathing techniques, such as pursed lip breathing or other method taught during program session;Maintenance of O2 saturations>88%;Compliance with respiratory medication;Demonstrates proper use of MDI's    Goals/Expected Outcomes Compliance and understanding of oxygen saturation monitoring and breathing techniques to decrease shortness of breath.          Initial Exercise Prescription:  Initial Exercise Prescription - 11/25/23 1000       Date of Initial Exercise RX and Referring Provider   Date 11/25/23    Referring Provider Assaker    Expected Discharge Date 02/18/24      Recumbant Elliptical    Level 1    RPM 35    Watts 40    Minutes 15    METs 1.4      Track   Minutes 15    METs 2      Prescription Details   Frequency (times per week) 2    Duration Progress to 30 minutes of continuous aerobic without signs/symptoms of physical distress      Intensity   THRR 40-80% of Max Heartrate 59-118    Ratings of Perceived Exertion  11-13    Perceived Dyspnea 0-4      Progression   Progression Continue to progress workloads to maintain intensity without signs/symptoms of physical distress.      Resistance Training   Training Prescription Yes    Weight red bands    Reps 10-15          Perform Capillary Blood Glucose checks as needed.  Exercise Prescription Changes:   Exercise Prescription Changes     Row Name 12/01/23 1200 12/15/23 1200           Response to Exercise   Blood Pressure (Admit) 130/66 110/64      Blood Pressure (Exercise) 174/80 128/70      Blood Pressure (Exit) 104/64 98/56      Heart Rate (Admit) 60 bpm 58 bpm      Heart Rate (Exercise) 73 bpm 84 bpm      Heart Rate (Exit) 62 bpm 70 bpm      Oxygen Saturation (Admit) 99 % 97 %      Oxygen Saturation (Exercise) 93 % 95 %      Oxygen Saturation (Exit) 97 % 96 %      Rating of Perceived Exertion (Exercise) 11 11      Perceived Dyspnea (Exercise) 1 1      Duration Progress to 30 minutes of  aerobic without signs/symptoms of physical distress Progress to 30 minutes of  aerobic without signs/symptoms of physical distress      Intensity THRR unchanged THRR unchanged        Resistance Training   Training Prescription Yes Yes      Weight red bands red bands      Reps 10-15 10-15      Time 10 Minutes 10 Minutes        Recumbant Elliptical   Level 1 2      Minutes 15 15      METs 2 2.5        Track   Laps 13 14      Minutes 15 15      METs 2.62 2.75         Exercise Comments:   Exercise Goals and Review:   Exercise Goals     Row Name 11/25/23 0927             Exercise Goals    Increase Physical Activity Yes       Intervention Provide advice, education, support and counseling about physical activity/exercise needs.;Develop an individualized exercise prescription for aerobic and resistive training based on initial evaluation findings, risk stratification, comorbidities and participant's personal goals.       Expected Outcomes Short Term: Attend rehab on a regular basis to increase amount of physical activity.;Long Term: Add in home exercise to make exercise part of routine and to increase amount of physical activity.;Long Term: Exercising regularly at least 3-5 days a week.       Increase Strength and Stamina Yes       Intervention Provide advice, education, support and counseling about physical activity/exercise needs.;Develop an individualized exercise prescription for aerobic and resistive training based on initial evaluation findings, risk stratification, comorbidities and participant's personal goals.       Expected Outcomes Short Term: Increase workloads from initial exercise prescription for resistance, speed, and METs.;Short Term: Perform resistance training exercises routinely during rehab and add in resistance training at home;Long Term: Improve cardiorespiratory fitness, muscular endurance and strength as measured by increased METs and functional capacity ( )  Able to understand and use rate of perceived exertion (RPE) scale Yes       Intervention Provide education and explanation on how to use RPE scale       Expected Outcomes Short Term: Able to use RPE daily in rehab to express subjective intensity level;Long Term:  Able to use RPE to guide intensity level when exercising independently       Able to understand and use Dyspnea scale Yes       Intervention Provide education and explanation on how to use Dyspnea scale       Expected Outcomes Short Term: Able to use Dyspnea scale daily in rehab to express subjective sense of shortness of breath during  exertion;Long Term: Able to use Dyspnea scale to guide intensity level when exercising independently       Knowledge and understanding of Target Heart Rate Range (THRR) Yes       Intervention Provide education and explanation of THRR including how the numbers were predicted and where they are located for reference       Expected Outcomes Short Term: Able to state/look up THRR;Long Term: Able to use THRR to govern intensity when exercising independently;Short Term: Able to use daily as guideline for intensity in rehab       Understanding of Exercise Prescription Yes       Intervention Provide education, explanation, and written materials on patient's individual exercise prescription       Expected Outcomes Short Term: Able to explain program exercise prescription;Long Term: Able to explain home exercise prescription to exercise independently          Exercise Goals Re-Evaluation :  Exercise Goals Re-Evaluation     Row Name 12/04/23 1048             Exercise Goal Re-Evaluation   Exercise Goals Review Increase Physical Activity;Able to understand and use Dyspnea scale;Understanding of Exercise Prescription;Increase Strength and Stamina;Knowledge and understanding of Target Heart Rate Range (THRR);Able to understand and use rate of perceived exertion (RPE) scale       Comments Deatrice has completed 2 exercise sessions. He exercises for 15 min on the track and Octane. He averages 2.75 METs on the track and 2.6 METs at level 1 on the Octane. He performs the warmup and cooldown standing without limitations. It is too soon to notate and disernable progressions. Will continue to monitor and progress as able.       Expected Outcomes Through exercise at rehab and home, the patient will decrease shortness of breath with daily activities and feel confident in carrying out an exercise regimen at home.          Discharge Exercise Prescription (Final Exercise Prescription Changes):  Exercise Prescription  Changes - 12/15/23 1200       Response to Exercise   Blood Pressure (Admit) 110/64    Blood Pressure (Exercise) 128/70    Blood Pressure (Exit) 98/56    Heart Rate (Admit) 58 bpm    Heart Rate (Exercise) 84 bpm    Heart Rate (Exit) 70 bpm    Oxygen Saturation (Admit) 97 %    Oxygen Saturation (Exercise) 95 %    Oxygen Saturation (Exit) 96 %    Rating of Perceived Exertion (Exercise) 11    Perceived Dyspnea (Exercise) 1    Duration Progress to 30 minutes of  aerobic without signs/symptoms of physical distress    Intensity THRR unchanged      Resistance Training   Training Prescription Yes  Weight red bands    Reps 10-15    Time 10 Minutes      Recumbant Elliptical   Level 2    Minutes 15    METs 2.5      Track   Laps 14    Minutes 15    METs 2.75          Nutrition:  Target Goals: Understanding of nutrition guidelines, daily intake of sodium 1500mg , cholesterol 200mg , calories 30% from fat and 7% or less from saturated fats, daily to have 5 or more servings of fruits and vegetables.  Biometrics:  Pre Biometrics - 11/25/23 0921       Pre Biometrics   Grip Strength 30 kg           Nutrition Therapy Plan and Nutrition Goals:   Nutrition Assessments:  MEDIFICTS Score Key: >=70 Need to make dietary changes  40-70 Heart Healthy Diet <= 40 Therapeutic Level Cholesterol Diet   Picture Your Plate Scores: <59 Unhealthy dietary pattern with much room for improvement. 41-50 Dietary pattern unlikely to meet recommendations for good health and room for improvement. 51-60 More healthful dietary pattern, with some room for improvement.  >60 Healthy dietary pattern, although there may be some specific behaviors that could be improved.    Nutrition Goals Re-Evaluation:   Nutrition Goals Discharge (Final Nutrition Goals Re-Evaluation):   Psychosocial: Target Goals: Acknowledge presence or absence of significant depression and/or stress, maximize coping  skills, provide positive support system. Participant is able to verbalize types and ability to use techniques and skills needed for reducing stress and depression.  Initial Review & Psychosocial Screening:  Initial Psych Review & Screening - 11/25/23 0931       Initial Review   Current issues with None Identified      Family Dynamics   Good Support System? Yes    Comments spouse and 2 children      Barriers   Psychosocial barriers to participate in program There are no identifiable barriers or psychosocial needs.      Screening Interventions   Interventions Encouraged to exercise          Quality of Life Scores:  Scores of 19 and below usually indicate a poorer quality of life in these areas.  A difference of  2-3 points is a clinically meaningful difference.  A difference of 2-3 points in the total score of the Quality of Life Index has been associated with significant improvement in overall quality of life, self-image, physical symptoms, and general health in studies assessing change in quality of life.  PHQ-9: Review Flowsheet  More data exists      11/25/2023 11/19/2023 04/28/2023 12/01/2022 11/29/2021  Depression screen PHQ 2/9  Decreased Interest 0 0 0 0 0  Down, Depressed, Hopeless 0 0 0 0 0  PHQ - 2 Score 0 0 0 0 0  Altered sleeping 0 0 - 0 0  Tired, decreased energy 0 0 - 0 0  Change in appetite 0 0 - 0 0  Feeling bad or failure about yourself  0 0 - 0 0  Trouble concentrating 0 0 - 0 0  Moving slowly or fidgety/restless 0 1 - 0 0  Suicidal thoughts 0 0 - 0 0  PHQ-9 Score 0 1 - 0 0  Difficult doing work/chores Not difficult at all Not difficult at all - Not difficult at all Not difficult at all   Interpretation of Total Score  Total Score Depression Severity:  1-4 =  Minimal depression, 5-9 = Mild depression, 10-14 = Moderate depression, 15-19 = Moderately severe depression, 20-27 = Severe depression   Psychosocial Evaluation and Intervention:  Psychosocial  Evaluation - 11/25/23 0932       Psychosocial Evaluation & Interventions   Interventions Encouraged to exercise with the program and follow exercise prescription    Comments Deatrice denies any psychosocial barriers at this time.    Expected Outcomes For Deatrice to participate in PR free of psychosocial barriers.    Continue Psychosocial Services  No Follow up required          Psychosocial Re-Evaluation:  Psychosocial Re-Evaluation     Row Name 12/07/23 1430             Psychosocial Re-Evaluation   Current issues with None Identified       Comments Deatrice continues to deny any new psychosocial barriers or concerns at this time.       Expected Outcomes For Deatrice to participate in PR free of any psychosocial barriers or concerns       Interventions Encouraged to attend Pulmonary Rehabilitation for the exercise       Continue Psychosocial Services  No Follow up required          Psychosocial Discharge (Final Psychosocial Re-Evaluation):  Psychosocial Re-Evaluation - 12/07/23 1430       Psychosocial Re-Evaluation   Current issues with None Identified    Comments Deatrice continues to deny any new psychosocial barriers or concerns at this time.    Expected Outcomes For Deatrice to participate in PR free of any psychosocial barriers or concerns    Interventions Encouraged to attend Pulmonary Rehabilitation for the exercise    Continue Psychosocial Services  No Follow up required          Education: Education Goals: Education classes will be provided on a weekly basis, covering required topics. Participant will state understanding/return demonstration of topics presented.  Learning Barriers/Preferences:  Learning Barriers/Preferences - 11/25/23 0934       Learning Barriers/Preferences   Learning Barriers Sight    Learning Preferences None          Education Topics: Know Your Numbers Group instruction that is supported by a PowerPoint presentation. Instructor discusses  importance of knowing and understanding resting, exercise, and post-exercise oxygen saturation, heart rate, and blood pressure. Oxygen saturation, heart rate, blood pressure, rating of perceived exertion, and dyspnea are reviewed along with a normal range for these values.  Flowsheet Row PULMONARY REHAB OTHER RESPIRATORY from 12/03/2023 in [facility]  Date 12/03/23  Educator EP  Instruction Review Code 1- Verbalizes Understanding    Exercise for the Pulmonary Patient Group instruction that is supported by a PowerPoint presentation. Instructor discusses benefits of exercise, core components of exercise, frequency, duration, and intensity of an exercise routine, importance of utilizing pulse oximetry during exercise, safety while exercising, and options of places to exercise outside of rehab.    MET Level  Group instruction provided by PowerPoint, verbal discussion, and written material to support subject matter. Instructor reviews what METs are and how to increase METs.    Pulmonary Medications Verbally interactive group education provided by instructor with focus on inhaled medications and proper administration.   Anatomy and Physiology of the Respiratory System Group instruction provided by PowerPoint, verbal discussion, and written material to support subject matter. Instructor reviews respiratory cycle and anatomical components of the respiratory system and their functions. Instructor also reviews  differences in obstructive and restrictive respiratory diseases with examples of each.    Oxygen Safety Group instruction provided by PowerPoint, verbal discussion, and written material to support subject matter. There is an overview of "What is Oxygen" and "Why do we need it".  Instructor also reviews how to create a safe environment for oxygen use, the importance of using oxygen as prescribed, and the risks of noncompliance. There is a  brief discussion on traveling with oxygen and resources the patient may utilize. Flowsheet Row PULMONARY REHAB OTHER RESPIRATORY from 12/10/2023 in [facility] for Heart, Vascular, & Lung Health  Date 12/10/23  Educator RN  Instruction Review Code 1- Verbalizes Understanding    Oxygen Use Group instruction provided by PowerPoint, verbal discussion, and written material to discuss how supplemental oxygen is prescribed and different types of oxygen supply systems. Resources for more information are provided.    Breathing Techniques Group instruction that is supported by demonstration and informational handouts. Instructor discusses the benefits of pursed lip and diaphragmatic breathing and detailed demonstration on how to perform both.     Risk Factor Reduction Group instruction that is supported by a PowerPoint presentation. Instructor discusses the definition of a risk factor, different risk factors for pulmonary disease, and how the heart and lungs work together.   Pulmonary Diseases Group instruction provided by PowerPoint, verbal discussion, and written material to support subject matter. Instructor gives an overview of the different type of pulmonary diseases. There is also a discussion on risk factors and symptoms as well as ways to manage the diseases.   Stress and Energy Conservation Group instruction provided by PowerPoint, verbal discussion, and written material to support subject matter. Instructor gives an overview of stress and the impact it can have on the body. Instructor also reviews ways to reduce stress. There is also a discussion on energy conservation and ways to conserve energy throughout the day.   Warning Signs and Symptoms Group instruction provided by PowerPoint, verbal discussion, and written material to support subject matter. Instructor reviews warning signs and symptoms of stroke, heart attack, cold and flu. Instructor also reviews ways  to prevent the spread of infection.   Other Education Group or individual verbal, written, or video instructions that support the educational goals of the pulmonary rehab program.    Knowledge Questionnaire Score:  Knowledge Questionnaire Score - 11/25/23 1105       Knowledge Questionnaire Score   Pre Score 15/18          Core Components/Risk Factors/Patient Goals at Admission:  Personal Goals and Risk Factors at Admission - 11/25/23 0934       Core Components/Risk Factors/Patient Goals on Admission   Improve shortness of breath with ADL's Yes    Intervention Provide education, individualized exercise plan and daily activity instruction to help decrease symptoms of SOB with activities of daily living.    Expected Outcomes Short Term: Improve cardiorespiratory fitness to achieve a reduction of symptoms when performing ADLs;Long Term: Be able to perform more ADLs without symptoms or delay the onset of symptoms          Core Components/Risk Factors/Patient Goals Review:   Goals and Risk Factor Review     Row Name 12/07/23 1431             Core Components/Risk Factors/Patient Goals Review   Personal Goals Review Improve shortness of breath with ADL's;Develop more efficient breathing techniques such as purse lipped breathing and diaphragmatic breathing and practicing self-pacing with  activity.       Review Monthly review of patient's Core Components/Risk Factors/Patient Goals are as follows: Goal progressing for improving shortness of breath. Goal progressing for developing more efficient breathing techniques such as purse lipped breathing and diaphragmatic breathing; and practicing self-pacing with activity. Deatrice is currently exercising on RA to keep sats >88%. We will continue to monitor his progress throughout the program.       Expected Outcomes To improve shortness of breath with ADL's and develop more efficient breathing techniques such as purse lipped breathing and  diaphragmatic breathing; and practicing self-pacing with activity          Core Components/Risk Factors/Patient Goals at Discharge (Final Review):   Goals and Risk Factor Review - 12/07/23 1431       Core Components/Risk Factors/Patient Goals Review   Personal Goals Review Improve shortness of breath with ADL's;Develop more efficient breathing techniques such as purse lipped breathing and diaphragmatic breathing and practicing self-pacing with activity.    Review Monthly review of patient's Core Components/Risk Factors/Patient Goals are as follows: Goal progressing for improving shortness of breath. Goal progressing for developing more efficient breathing techniques such as purse lipped breathing and diaphragmatic breathing; and practicing self-pacing with activity. Deatrice is currently exercising on RA to keep sats >88%. We will continue to monitor his progress throughout the program.    Expected Outcomes To improve shortness of breath with ADL's and develop more efficient breathing techniques such as purse lipped breathing and diaphragmatic breathing; and practicing self-pacing with activity          ITP Comments: Pt is making expected progress toward Pulmonary Rehab goals after completing 5 session(s). Recommend continued exercise, life style modification, education, and utilization of breathing techniques to increase stamina and strength, while also decreasing shortness of breath with exertion.  Dr. Slater Staff is Medical Director for Pulmonary Rehab at [facility].

## 2023-12-17 ENCOUNTER — Encounter (HOSPITAL_COMMUNITY)
Admission: RE | Admit: 2023-12-17 | Discharge: 2023-12-17 | Disposition: A | Discharge status: Home / Self Care | Source: Ambulatory Visit | Attending: Pulmonary Disease | Admitting: Pulmonary Disease

## 2023-12-17 DIAGNOSIS — J432 Centrilobular emphysema: Secondary | ICD-10-CM | POA: Diagnosis present

## 2023-12-17 NOTE — Progress Notes (Signed)
 Daily Session Note  Patient Details  Name: [name] MRN: [id number] Date of Birth: 1950-12-18 Referring Provider:   Conrad Ports Pulmonary Rehab Walk Test from 11/25/2023 in [facility] for Heart, Vascular, & Lung Health  Referring Provider Assaker    Encounter Date: 12/17/2023  Check In:  Session Check In - 12/17/23 1030       Check-In   Supervising physician immediately available to respond to emergencies [facility] MD immediately available    Physician(s) Jackee Alberts, NP    Location [facility]    Staff Present Ronal Levin, RN, BSN;Casey Claudene, Neita Moats, MS, ACSM-CEP, Exercise Physiologist;Jetta Walker BS, ACSM-CEP, Exercise Physiologist    Virtual Visit No    Medication changes reported     No    Fall or balance concerns reported    No    Tobacco Cessation No Change    Warm-up and Cool-down Performed as group-led instruction    Resistance Training Performed Yes    VAD Patient? No    PAD/SET Patient? No      Pain Assessment   Currently in Pain? No/denies    Multiple Pain Sites No          Capillary Blood Glucose: No results found for this or any previous visit (from the past 24 hours).    Social History   Tobacco Use  Smoking Status Former   Current packs/day: 0.00   Average packs/day: 0.5 packs/day for 54.0 years (27.0 ttl pk-yrs)   Types: Cigarettes   Start date: 51   Quit date: 03/17/2022   Years since quitting: 1.7  Smokeless Tobacco Never  Tobacco Comments   11/30/19 .5-1pack per day    Goals Met:  Proper associated with RPD/PD & O2 Sat Exercise tolerated well No report of concerns or symptoms today Strength training completed today  Goals Unmet:  Not Applicable  Comments: Service time is from 1001 to 1140.    Dr. Slater Staff is Medical Director for Pulmonary Rehab at [name].

## 2023-12-22 ENCOUNTER — Encounter (HOSPITAL_COMMUNITY)
Admission: RE | Admit: 2023-12-22 | Discharge: 2023-12-22 | Disposition: A | Source: Ambulatory Visit | Attending: Pulmonary Disease

## 2023-12-22 DIAGNOSIS — J432 Centrilobular emphysema: Secondary | ICD-10-CM

## 2023-12-22 NOTE — Progress Notes (Signed)
 Daily Session Note  Patient Details  Name: [name] MRN: [id number] Date of Birth: 1951/03/13 Referring Provider:   Conrad Ports Pulmonary Rehab Walk Test from 11/25/2023 in [facility] for Heart, Vascular, & Lung Health  Referring Provider Assaker    Encounter Date: 12/22/2023  Check In:  Session Check In - 12/22/23 1029       Check-In   Supervising physician immediately available to respond to emergencies [facility] immediately available    Physician(s) Barnie Hila, NP    [facility]    Staff Present Ronal Levin, RN, BSN;Nikkole Placzek Claudene, Neita Moats, MS, ACSM-CEP, Exercise Physiologist;Joseph Lennon, RN, BSN    Virtual Visit No    Medication changes reported     No    Fall or balance concerns reported    No    Tobacco Cessation No Change    Warm-up and Cool-down Performed as group-led instruction    Resistance Training Performed Yes    VAD Patient? No    PAD/SET Patient? No      Pain Assessment   Currently in Pain? No/denies    Multiple Pain Sites No          Capillary Blood Glucose: No results found for this or any previous visit (from the past 24 hours).    Social History   Tobacco Use  Smoking Status Former   Current packs/day: 0.00   Average packs/day: 0.5 packs/day for 54.0 years (27.0 ttl pk-yrs)   Types: Cigarettes   Start date: 61   Quit date: 03/17/2022   Years since quitting: 1.7  Smokeless Tobacco Never  Tobacco Comments   11/30/19 .5-1pack per day    Goals Met:  Proper associated with RPD/PD & O2 Sat Independence with exercise equipment Exercise tolerated well No report of concerns or symptoms today Strength training completed today  Goals Unmet:  Not Applicable  Comments: Service time is from 1007 to 1140.    Dr. Slater Staff is Medical Director for Pulmonary Rehab at [facility].

## 2023-12-24 ENCOUNTER — Other Ambulatory Visit: Payer: Self-pay | Admitting: Internal Medicine

## 2023-12-24 ENCOUNTER — Encounter (HOSPITAL_COMMUNITY)
Admission: RE | Admit: 2023-12-24 | Discharge: 2023-12-24 | Disposition: A | Discharge status: Home / Self Care | Source: Ambulatory Visit | Attending: Pulmonary Disease | Admitting: Pulmonary Disease

## 2023-12-24 DIAGNOSIS — L812 Freckles: Secondary | ICD-10-CM | POA: Diagnosis not present

## 2023-12-24 DIAGNOSIS — J432 Centrilobular emphysema: Secondary | ICD-10-CM | POA: Diagnosis not present

## 2023-12-24 DIAGNOSIS — Z85820 Personal history of malignant melanoma of skin: Secondary | ICD-10-CM | POA: Diagnosis not present

## 2023-12-24 DIAGNOSIS — Z85828 Personal history of other malignant neoplasm of skin: Secondary | ICD-10-CM | POA: Diagnosis not present

## 2023-12-24 DIAGNOSIS — L570 Actinic keratosis: Secondary | ICD-10-CM | POA: Diagnosis not present

## 2023-12-24 DIAGNOSIS — L821 Other seborrheic keratosis: Secondary | ICD-10-CM | POA: Diagnosis not present

## 2023-12-24 NOTE — Progress Notes (Signed)
 Daily Session Note  Patient Details  Name: [name] MRN: [id number] Date of Birth: Oct 19, 1950 Referring Provider:   Conrad Ports Pulmonary Rehab Walk Test from 11/25/2023 in [facility] for Heart, Vascular, & Lung Health  Referring Provider Assaker    Encounter Date: 12/24/2023  Check In:  Session Check In - 12/24/23 1326       Check-In   Supervising physician immediately available to respond to emergencies [facility] immediately available    Physician(s) Josefa Beauvais, NP    [facility]    Staff Present Ronal Levin, RN, BSN;Casey Claudene, Neita Moats, MS, ACSM-CEP, Exercise Physiologist;Randi Midge HECKLE, ACSM-CEP, Exercise Physiologist    Virtual Visit No    Medication changes reported     No    Fall or balance concerns reported    No    Tobacco Cessation No Change    Warm-up and Cool-down Performed as group-led instruction    Resistance Training Performed Yes    VAD Patient? No    PAD/SET Patient? No      Pain Assessment   Currently in Pain? No/denies    Multiple Pain Sites No          Capillary Blood Glucose: No results found for this or any previous visit (from the past 24 hours).    Social History   Tobacco Use  Smoking Status Former   Current packs/day: 0.00   Average packs/day: 0.5 packs/day for 54.0 years (27.0 ttl pk-yrs)   Types: Cigarettes   Start date: 37   Quit date: 03/17/2022   Years since quitting: 1.7  Smokeless Tobacco Never  Tobacco Comments   11/30/19 .5-1pack per day    Goals Met:  Proper associated with RPD/PD & O2 Sat Independence with exercise equipment Exercise tolerated well No report of concerns or symptoms today Strength training completed today  Goals Unmet:  Not Applicable  Comments: Service time is from 1308 to 1448.    Dr. Slater Staff is Medical Director for Pulmonary Rehab at [facility].

## 2023-12-29 ENCOUNTER — Encounter (HOSPITAL_COMMUNITY)
Admission: RE | Admit: 2023-12-29 | Discharge: 2023-12-29 | Disposition: A | Discharge status: Home / Self Care | Source: Ambulatory Visit | Attending: Pulmonary Disease | Admitting: Pulmonary Disease

## 2023-12-29 VITALS — Wt 170.4 lb

## 2023-12-29 DIAGNOSIS — J432 Centrilobular emphysema: Secondary | ICD-10-CM | POA: Diagnosis not present

## 2023-12-29 NOTE — Progress Notes (Signed)
 Daily Session Note  Patient Details  Name: [name] MRN: [id number] Date of Birth: August 25, 1950 Referring Provider:   Conrad Ports Pulmonary Rehab Walk Test from 11/25/2023 in [facility] for Heart, Vascular, & Lung Health  Referring Provider Assaker    Encounter Date: 12/29/2023  Check In:  Session Check In - 12/29/23 1031       Check-In   Supervising physician immediately available to respond to emergencies [facility] immediately available    Physician(s) Orren Fabry, PA    [facility]    Staff Present Ronal Levin, RN, BSN;Casey Claudene, Neita Moats, MS, ACSM-CEP, Exercise Physiologist;Randi Midge HECKLE, ACSM-CEP, Exercise Physiologist;Johnny [place]ayette, MS, Exercise Physiologist    Virtual Visit No    Medication changes reported     No    Fall or balance concerns reported    No    Tobacco Cessation No Change    Warm-up and Cool-down Performed as group-led instruction    Resistance Training Performed Yes    VAD Patient? No    PAD/SET Patient? No      Pain Assessment   Currently in Pain? No/denies    Multiple Pain Sites No          Capillary Blood Glucose: No results found for this or any previous visit (from the past 24 hours).   Exercise Prescription Changes - 12/29/23 1200       Response to Exercise   Blood Pressure (Admit) 130/70    Blood Pressure (Exercise) 170/80    Blood Pressure (Exit) 112/60    Heart Rate (Admit) 64 bpm    Heart Rate (Exercise) 82 bpm    Heart Rate (Exit) 66 bpm    Oxygen Saturation (Admit) 99 %    Oxygen Saturation (Exercise) 96 %    Oxygen Saturation (Exit) 99 %    Rating of Perceived Exertion (Exercise) 11    Perceived Dyspnea (Exercise) 1    Duration Continue with 30 min of aerobic exercise without signs/symptoms of physical distress.    Intensity THRR unchanged      Resistance Training   Training Prescription Yes    Weight red bands    Reps 10-15    Time 10 Minutes       Treadmill   MPH 1.5    Grade 0    Minutes 15    METs 2      Recumbant Elliptical   Level 3    Minutes 15    METs 2.1          Social History   Tobacco Use  Smoking Status Former   Current packs/day: 0.00   Average packs/day: 0.5 packs/day for 54.0 years (27.0 ttl pk-yrs)   Types: Cigarettes   Start date: 71   Quit date: 03/17/2022   Years since quitting: 1.7  Smokeless Tobacco Never  Tobacco Comments   11/30/19 .5-1pack per day    Goals Met:  Independence with exercise equipment Exercise tolerated well No report of concerns or symptoms today Strength training completed today  Goals Unmet:  Not Applicable  Comments: Service time is from 1018 to 1134    Dr. Slater Staff is Medical Director for Pulmonary Rehab at [facility].

## 2023-12-31 ENCOUNTER — Encounter (HOSPITAL_COMMUNITY)
Admission: RE | Admit: 2023-12-31 | Discharge: 2023-12-31 | Disposition: A | Discharge status: Home / Self Care | Source: Ambulatory Visit | Attending: Pulmonary Disease | Admitting: Pulmonary Disease

## 2023-12-31 DIAGNOSIS — J432 Centrilobular emphysema: Secondary | ICD-10-CM

## 2023-12-31 NOTE — Progress Notes (Signed)
 Daily Session Note  Patient Details  Name: [name] MRN: [id number] Date of Birth: Jul 17, 1950 Referring Provider:   Conrad Ports Pulmonary Rehab Walk Test from 11/25/2023 in [facility] for Heart, Vascular, & Lung Health  Referring Provider Assaker    Encounter Date: 12/31/2023  Check In:  Session Check In - 12/31/23 1031       Check-In   Supervising physician immediately available to respond to emergencies [facility] immediately available    Physician(s) Katlin West, NP    [facility]    Staff Present Ronal Levin, RN, BSN;Casey Claudene, Neita Moats, MS, ACSM-CEP, Exercise Physiologist;Randi Midge BS, ACSM-CEP, Exercise Physiologist    Virtual Visit No    Medication changes reported     No    Fall or balance concerns reported    No    Tobacco Cessation No Change    Warm-up and Cool-down Performed as group-led instruction    Resistance Training Performed Yes    VAD Patient? No    PAD/SET Patient? No      Pain Assessment   Currently in Pain? No/denies          Capillary Blood Glucose: No results found for this or any previous visit (from the past 24 hours).    Social History   Tobacco Use  Smoking Status Former   Current packs/day: 0.00   Average packs/day: 0.5 packs/day for 54.0 years (27.0 ttl pk-yrs)   Types: Cigarettes   Start date: 31   Quit date: 03/17/2022   Years since quitting: 1.7  Smokeless Tobacco Never  Tobacco Comments   11/30/19 .5-1pack per day    Goals Met:  Proper associated with RPD/PD & O2 Sat Independence with exercise equipment Exercise tolerated well No report of concerns or symptoms today Strength training completed today  Goals Unmet:  Not Applicable  Comments: Service time is from 1009 to 1140.    Dr. Slater Staff is Medical Director for Pulmonary Rehab at [facility].

## 2024-01-05 ENCOUNTER — Encounter (HOSPITAL_COMMUNITY)
Admission: RE | Admit: 2024-01-05 | Discharge: 2024-01-05 | Disposition: A | Discharge status: Home / Self Care | Source: Ambulatory Visit | Attending: Pulmonary Disease | Admitting: Pulmonary Disease

## 2024-01-05 DIAGNOSIS — J432 Centrilobular emphysema: Secondary | ICD-10-CM | POA: Diagnosis not present

## 2024-01-05 NOTE — Progress Notes (Signed)
 Daily Session Note  Patient Details  Name: [name] MRN: [id number] Date of Birth: Aug 02, 1950 Referring Provider:   Conrad Ports Pulmonary Rehab Walk Test from 11/25/2023 in [facility] for Heart, Vascular, & Lung Health  Referring Provider Assaker    Encounter Date: 01/05/2024  Check In:  Session Check In - 01/05/24 1128       Check-In   Supervising physician immediately available to respond to emergencies [facility] immediately available    Physician(s) Rosabel Mose, NP    [facility]    Staff Present Ronal Levin, RN, BSN;Casey Claudene, RT;[place] Gamero Pulcifer BS, ACSM-CEP, Exercise Physiologist;Olinty Lizton, MS, ACSM-CEP, Exercise Physiologist    Virtual Visit No    Medication changes reported     No    Fall or balance concerns reported    No    Tobacco Cessation No Change    Warm-up and Cool-down Performed as group-led instruction    Resistance Training Performed Yes    VAD Patient? No    PAD/SET Patient? No      Pain Assessment   Currently in Pain? No/denies    Multiple Pain Sites No          Capillary Blood Glucose: No results found for this or any previous visit (from the past 24 hours).    Social History   Tobacco Use  Smoking Status Former   Current packs/day: 0.00   Average packs/day: 0.5 packs/day for 54.0 years (27.0 ttl pk-yrs)   Types: Cigarettes   Start date: 9   Quit date: 03/17/2022   Years since quitting: 1.8  Smokeless Tobacco Never  Tobacco Comments   11/30/19 .5-1pack per day    Goals Met:  Exercise tolerated well No report of concerns or symptoms today Strength training completed today  Goals Unmet:  Not Applicable  Comments: Service time is from 1003 to 1136.    Dr. Slater Staff is Medical Director for Pulmonary Rehab at [facility]

## 2024-01-07 ENCOUNTER — Encounter (HOSPITAL_COMMUNITY)
Admission: RE | Admit: 2024-01-07 | Discharge: 2024-01-07 | Disposition: A | Source: Ambulatory Visit | Attending: Pulmonary Disease

## 2024-01-07 DIAGNOSIS — J432 Centrilobular emphysema: Secondary | ICD-10-CM | POA: Diagnosis not present

## 2024-01-07 NOTE — Progress Notes (Signed)
 Daily Session Note  Patient Details  Name: [name] MRN: [id number] Date of Birth: 1951/02/09 Referring Provider:   Conrad Ports Pulmonary Rehab Walk Test from 11/25/2023 in [facility] for Heart, Vascular, & Lung Health  Referring Provider Assaker    Encounter Date: 01/07/2024  Check In:  Session Check In - 01/07/24 1158       Check-In   Supervising physician immediately available to respond to emergencies [facility] MD immediately available    Physician(s) Damien Braver, NP    [facility]    Staff Present Ronal Levin, RN, BSN;Marshella Tello Claudene, RT;Randi [facility] BS, ACSM-CEP, Exercise Physiologist;Kaylee Nicholaus, MS, ACSM-CEP, Exercise Physiologist    Virtual Visit No    Medication changes reported     No    Fall or balance concerns reported    No    Tobacco Cessation No Change    Warm-up and Cool-down Performed as group-led instruction    Resistance Training Performed Yes    VAD Patient? No    PAD/SET Patient? No      Pain Assessment   Currently in Pain? No/denies    Multiple Pain Sites No          Capillary Blood Glucose: No results found for this or any previous visit (from the past 24 hours).    Social History   Tobacco Use  Smoking Status Former   Current packs/day: 0.00   Average packs/day: 0.5 packs/day for 54.0 years (27.0 ttl pk-yrs)   Types: Cigarettes   Start date: 58   Quit date: 03/17/2022   Years since quitting: 1.8  Smokeless Tobacco Never  Tobacco Comments   11/30/19 .5-1pack per day    Goals Met:  Proper associated with RPD/PD & O2 Sat Independence with exercise equipment Exercise tolerated well No report of concerns or symptoms today Strength training completed today  Goals Unmet:  Not Applicable  Comments: Service time is from 1004 to 1144.    Dr. Slater Staff is Medical Director for Pulmonary Rehab at [facility].

## 2024-01-12 ENCOUNTER — Encounter (HOSPITAL_COMMUNITY)
Admission: RE | Admit: 2024-01-12 | Discharge: 2024-01-12 | Disposition: A | Source: Ambulatory Visit | Attending: Pulmonary Disease

## 2024-01-12 VITALS — Wt 168.2 lb

## 2024-01-12 DIAGNOSIS — J432 Centrilobular emphysema: Secondary | ICD-10-CM

## 2024-01-12 NOTE — Progress Notes (Signed)
 Daily Session Note  Patient Details  Name: [name] MRN: [id number] Date of Birth: 1951/01/02 Referring Provider:   Conrad Ports Pulmonary Rehab Walk Test from 11/25/2023 in [facility] for Heart, Vascular, & Lung Health  Referring Provider Assaker    Encounter Date: 01/12/2024  Check In:  Session Check In - 01/12/24 1029       Check-In   Supervising physician immediately available to respond to emergencies [facility] MD immediately available    Physician(s) Lum Louis, NP    [facility]    Staff Present Ronal Levin, RN, BSN;Casey Claudene, RT;Randi Lindy BS, ACSM-CEP, Exercise Physiologist;Shaleigh Laubscher [place], MS, ACSM-CEP, Exercise Physiologist    Virtual Visit No    Medication changes reported     No    Fall or balance concerns reported    No    Tobacco Cessation No Change    Warm-up and Cool-down Performed as group-led instruction    Resistance Training Performed Yes    VAD Patient? No    PAD/SET Patient? No      Pain Assessment   Currently in Pain? No/denies          Capillary Blood Glucose: No results found for this or any previous visit (from the past 24 hours).   Exercise Prescription Changes - 01/12/24 1200       Response to Exercise   Blood Pressure (Admit) 112/64    Blood Pressure (Exercise) 120/70    Blood Pressure (Exit) 102/64    Heart Rate (Admit) 63 bpm    Heart Rate (Exercise) 81 bpm    Heart Rate (Exit) 65 bpm    Oxygen Saturation (Admit) 98 %    Oxygen Saturation (Exercise) 95 %    Oxygen Saturation (Exit) 96 %    Rating of Perceived Exertion (Exercise) 12    Perceived Dyspnea (Exercise) 1    Duration Continue with 30 min of aerobic exercise without signs/symptoms of physical distress.    Intensity THRR unchanged      Resistance Training   Training Prescription Yes    Weight black bands    Reps 10-15    Time 10 Minutes      Treadmill   MPH 1.7    Grade 1    Minutes 215    METs 2.4       Recumbant Elliptical   Level 3    Minutes 15    METs 2.7          Social History   Tobacco Use  Smoking Status Former   Current packs/day: 0.00   Average packs/day: 0.5 packs/day for 54.0 years (27.0 ttl pk-yrs)   Types: Cigarettes   Start date: 35   Quit date: 03/17/2022   Years since quitting: 1.8  Smokeless Tobacco Never  Tobacco Comments   11/30/19 .5-1pack per day    Goals Met:  Proper associated with RPD/PD & O2 Sat Independence with exercise equipment Exercise tolerated well No report of concerns or symptoms today Strength training completed today  Goals Unmet:  Not Applicable  Comments: Service time is from 1010 to 1137.    Dr. Slater Staff is Medical Director for Pulmonary Rehab at [facility].

## 2024-01-13 DIAGNOSIS — C61 Malignant neoplasm of prostate: Secondary | ICD-10-CM | POA: Diagnosis not present

## 2024-01-13 NOTE — Progress Notes (Signed)
 Pulmonary Individual Treatment Plan  Patient Details  Name: [name] MRN: [id number] Date of Birth: Aug 01, 1950 Referring Provider:   Conrad Ports Pulmonary Rehab Walk Test from 11/25/2023 in [facility]  Referring Provider Assaker    Initial Encounter Date:  Flowsheet Row Pulmonary Rehab Walk Test from 11/25/2023 in [facility]  Date 11/25/23    Visit Diagnosis: Centrilobular emphysema (HCC)  Patient's Home Medications on Admission:   Current Outpatient Medications:    albuterol  (VENTOLIN  HFA) 108 (90 Base) MCG/ACT inhaler, INHALE TWO PUFFS BY MOUTH INTO LUNGS every FOUR hours AS NEEDED FOR WHEEZING AND/OR SHORTNESS OF BREATH, Disp: 8.5 g, Rfl: 2   albuterol  (VENTOLIN  HFA) 108 (90 Base) MCG/ACT inhaler, Inhale 2 puffs into the lungs every 6 (six) hours as needed for wheezing or shortness of breath., Disp: 8 g, Rfl: 6   amiodarone  (PACERONE ) 200 MG tablet, TAKE 1/2 TABLET BY MOUTH ONCE DAILY, Disp: 45 tablet, Rfl: 3   atorvastatin  (LIPITOR) 40 MG tablet, Take one tablet by mouth once daily, Disp: 90 tablet, Rfl: 3   b complex vitamins capsule, Take 1 capsule by mouth daily., Disp: , Rfl:    lisinopril  (ZESTRIL ) 5 MG tablet, Take 1 tablet (5 mg total) by mouth daily., Disp: 90 tablet, Rfl: 3   metoprolol  succinate (TOPROL -XL) 25 MG 24 hr tablet, Take 1 tablet (25 mg total) by mouth daily., Disp: 90 tablet, Rfl: 3   Multiple Vitamin (MULTIVITAMIN WITH MINERALS) TABS tablet, Take 1 tablet by mouth daily., Disp: 30 tablet, Rfl: 0   rivaroxaban  (XARELTO ) 20 MG TABS tablet, Take 1 tablet (20 mg total) by mouth daily., Disp: 90 tablet, Rfl: 1   TRELEGY ELLIPTA  100-62.5-25 MCG/ACT AEPB, INHALE 1 PUFF BY MOUTH/INTO LUNGS EVERY DAY. *NEW INS?*, Disp: 60 each, Rfl: 9  Past Medical History: Past Medical History:  Diagnosis Date   Adenomatous colon polyp    Alcohol use     Antiphospholipid antibody positive    ???In the past???but not proven according to patient   Antiphospholipid antibody positive    ??? in the past, but not proven according to the patient.   Atrial flutter (HCC)    atrial flutter ablation,   Dr.Allred October 18, 2010   BPH (benign prostatic hypertrophy)    CAD (coronary artery disease)    Catheterization 2005 disease / catheterization July 06, 2010, chronic total occlusion distal RCA with collaterals from right and left side.  Medical therapy recommended, mild decreased LV function        Carotid artery disease    0-39% R. ICA, 40-59% LICA... Doppler.... January, 2010  /  Doppler... in January, 2011 no change  /  doppler1/27/2012...stable  0-39% RICA 40-59% LICA   CHF (congestive heart failure) (HCC)    COPD (chronic obstructive pulmonary disease) (HCC)    CRAO (central retinal artery occlusion)    Ejection fraction     EF 50-55%, echo, March, 2012... inferobasal and distal septal hypokinesis /      60%, echo, November, 2008   Emphysema of lung [facility])    GERD (gastroesophageal reflux disease)    Hypercholesterolemia    Hypertension    Myocardial infarction (HCC)    Nonsustained ventricular tachycardia (HCC)    Palpitations    probable SVT, rate 170,, at home,, lasted one hour,, 2012   Prostate cancer (HCC)    Raynaud's syndrome    Retinal  artery occlusion    Stroke (HCC)    mild stroke per pt    TIA (transient ischemic attack)    Possible small vessel tia in the past.   Tobacco abuse     Tobacco Use: Social History   Tobacco Use  Smoking Status Former   Current packs/day: 0.00   Average packs/day: 0.5 packs/day for 54.0 years (27.0 ttl pk-yrs)   Types: Cigarettes   Start date: 66   Quit date: 03/17/2022   Years since quitting: 1.8  Smokeless Tobacco Never  Tobacco Comments   11/30/19 .5-1pack per day    Labs: Review Flowsheet  More data exists      Latest Ref Rng & Units 08/18/2018 09/22/2019 11/11/2021 11/19/2022  12/08/2023  Labs for ITP Cardiac and Pulmonary Rehab  Cholestrol 0 - 200 mg/dL 875  875  856  853  866   LDL (calc) 0 - 99 mg/dL 46  48  54  65  51   HDL-C >39.00 mg/dL 62  62  73  64  33.59   Trlycerides 0.0 - 149.0 mg/dL 81  66  83  89  19.9   Hemoglobin A1c 4.6 - 6.5 % - - - 5.5  5.7     Capillary Blood Glucose: Lab Results  Component Value Date   GLUCAP 65 (L) 08/04/2023     Pulmonary Assessment Scores:  Pulmonary Assessment Scores     Row Name 11/25/23 0935         ADL UCSD   ADL Phase Entry     SOB Score total 62       CAT Score   CAT Score 22       mMRC Score   mMRC Score 3       UCSD: Self-administered rating of dyspnea associated with activities of daily living (ADLs) 6-point scale (0 = not at all to 5 = maximal or unable to do because of breathlessness)  Scoring Scores range from 0 to 120.  Minimally important difference is 5 units  CAT: CAT can identify the health impairment of COPD patients and is better correlated with disease progression.  CAT has a scoring range of zero to 40. The CAT score is classified into four groups of low (less than 10), medium (10 - 20), high (21-30) and very high (31-40) based on the impact level of disease on health status. A CAT score over 10 suggests significant symptoms.  A worsening CAT score could be explained by an exacerbation, poor medication adherence, poor inhaler technique, or progression of COPD or comorbid conditions.  CAT MCID is 2 points  mMRC: mMRC (Modified Medical Research Council) Dyspnea Scale is used to assess the degree of baseline functional disability in patients of respiratory disease due to dyspnea. No minimal important difference is established. A decrease in score of 1 point or greater is considered a positive change.   Pulmonary Function Assessment:  Pulmonary Function Assessment - 11/25/23 0928       Breath   Shortness of Breath Yes;Limiting activity          Exercise Target  Goals: Exercise Program Goal: Individual exercise prescription set using results from initial 6 min walk test and THRR while considering  patient's activity barriers and safety.   Exercise Prescription Goal: Initial exercise prescription builds to 30-45 minutes a day of aerobic activity, 2-3 days per week.  Home exercise guidelines will be given to patient during program as part of exercise prescription that the participant will  acknowledge.  Activity Barriers & Risk Stratification:  Activity Barriers & Cardiac Risk Stratification - 11/25/23 0938       Activity Barriers & Cardiac Risk Stratification   Activity Barriers Arthritis;Deconditioning;Muscular Weakness;Shortness of Breath          6 Minute Walk:  6 Minute Walk     Row Name 11/25/23 1036         6 Minute Walk   Phase Initial     Distance 1023 feet     Walk Time 6 minutes     # of Rest Breaks 0     MPH 1.94     METS 2.65     RPE 13     Perceived Dyspnea  1     VO2 Peak 9.27     Symptoms No     Resting HR 61 bpm     Resting BP 126/72     Resting Oxygen Saturation  97 %     Exercise Oxygen Saturation  during 6 min walk 93 %     Max Ex. HR 75 bpm     Max Ex. BP 144/70     2 Minute Post BP 132/70       Interval HR   1 Minute HR 65     2 Minute HR 70     3 Minute HR 73     4 Minute HR 73     5 Minute HR 75     6 Minute HR 75     2 Minute Post HR 61     Interval Heart Rate? Yes       Interval Oxygen   Interval Oxygen? Yes     Baseline Oxygen Saturation % 97 %     1 Minute Oxygen Saturation % 96 %     1 Minute Liters of Oxygen 0 L     2 Minute Oxygen Saturation % 94 %     2 Minute Liters of Oxygen 0 L     3 Minute Oxygen Saturation % 93 %     3 Minute Liters of Oxygen 0 L     4 Minute Oxygen Saturation % 94 %     4 Minute Liters of Oxygen 0 L     5 Minute Oxygen Saturation % 94 %     5 Minute Liters of Oxygen 0 L     6 Minute Oxygen Saturation % 93 %     6 Minute Liters of Oxygen 0 L     2 Minute  Post Oxygen Saturation % 98 %     2 Minute Post Liters of Oxygen 0 L        Oxygen Initial Assessment:  Oxygen Initial Assessment - 11/25/23 0928       Home Oxygen   Home Oxygen Device None    Sleep Oxygen Prescription None    Home Exercise Oxygen Prescription None    Home Resting Oxygen Prescription None      Initial 6 min Walk   Oxygen Used None      Program Oxygen Prescription   Program Oxygen Prescription None      Intervention   Short Term Goals To learn and understand importance of maintaining oxygen saturations>88%;To learn and demonstrate proper use of respiratory medications;To learn and understand importance of monitoring SPO2 with pulse oximeter and demonstrate accurate use of the pulse oximeter.;To learn and demonstrate proper pursed lip breathing techniques or other breathing techniques.     Long  Term Goals Verbalizes importance of monitoring SPO2 with pulse oximeter and return demonstration;Exhibits proper breathing techniques, such as pursed lip breathing or other method taught during program session;Maintenance of O2 saturations>88%;Compliance with respiratory medication;Demonstrates proper use of MDI's          Oxygen Re-Evaluation:  Oxygen Re-Evaluation     Row Name 12/04/23 1048 01/01/24 0933           Program Oxygen Prescription   Program Oxygen Prescription None None        Home Oxygen   Home Oxygen Device None None      Sleep Oxygen Prescription None None      Home Exercise Oxygen Prescription None None      Home Resting Oxygen Prescription None None        Goals/Expected Outcomes   Short Term Goals To learn and understand importance of maintaining oxygen saturations>88%;To learn and demonstrate proper use of respiratory medications;To learn and understand importance of monitoring SPO2 with pulse oximeter and demonstrate accurate use of the pulse oximeter.;To learn and demonstrate proper pursed lip breathing techniques or other breathing  techniques.  To learn and understand importance of maintaining oxygen saturations>88%;To learn and demonstrate proper use of respiratory medications;To learn and understand importance of monitoring SPO2 with pulse oximeter and demonstrate accurate use of the pulse oximeter.;To learn and demonstrate proper pursed lip breathing techniques or other breathing techniques.       Long  Term Goals Verbalizes importance of monitoring SPO2 with pulse oximeter and return demonstration;Exhibits proper breathing techniques, such as pursed lip breathing or other method taught during program session;Maintenance of O2 saturations>88%;Compliance with respiratory medication;Demonstrates proper use of MDI's Verbalizes importance of monitoring SPO2 with pulse oximeter and return demonstration;Exhibits proper breathing techniques, such as pursed lip breathing or other method taught during program session;Maintenance of O2 saturations>88%;Compliance with respiratory medication;Demonstrates proper use of MDI's      Goals/Expected Outcomes Compliance and understanding of oxygen saturation monitoring and breathing techniques to decrease shortness of breath. Compliance and understanding of oxygen saturation monitoring and breathing techniques to decrease shortness of breath.         Oxygen Discharge (Final Oxygen Re-Evaluation):  Oxygen Re-Evaluation - 01/01/24 0933       Program Oxygen Prescription   Program Oxygen Prescription None      Home Oxygen   Home Oxygen Device None    Sleep Oxygen Prescription None    Home Exercise Oxygen Prescription None    Home Resting Oxygen Prescription None      Goals/Expected Outcomes   Short Term Goals To learn and understand importance of maintaining oxygen saturations>88%;To learn and demonstrate proper use of respiratory medications;To learn and understand importance of monitoring SPO2 with pulse oximeter and demonstrate accurate use of the pulse oximeter.;To learn and demonstrate  proper pursed lip breathing techniques or other breathing techniques.     Long  Term Goals Verbalizes importance of monitoring SPO2 with pulse oximeter and return demonstration;Exhibits proper breathing techniques, such as pursed lip breathing or other method taught during program session;Maintenance of O2 saturations>88%;Compliance with respiratory medication;Demonstrates proper use of MDI's    Goals/Expected Outcomes Compliance and understanding of oxygen saturation monitoring and breathing techniques to decrease shortness of breath.          Initial Exercise Prescription:  Initial Exercise Prescription - 11/25/23 1000       Date of Initial Exercise RX and Referring Provider   Date 11/25/23    Referring Provider Assaker    Expected  Discharge Date 02/18/24      Recumbant Elliptical   Level 1    RPM 35    Watts 40    Minutes 15    METs 1.4      Track   Minutes 15    METs 2      Prescription Details   Frequency (times per week) 2    Duration Progress to 30 minutes of continuous aerobic without signs/symptoms of physical distress      Intensity   THRR 40-80% of Max Heartrate 59-118    Ratings of Perceived Exertion 11-13    Perceived Dyspnea 0-4      Progression   Progression Continue to progress workloads to maintain intensity without signs/symptoms of physical distress.      Resistance Training   Training Prescription Yes    Weight red bands    Reps 10-15          Perform Capillary Blood Glucose checks as needed.  Exercise Prescription Changes:   Exercise Prescription Changes     Row Name 12/01/23 1200 12/15/23 1200 12/29/23 1200 01/12/24 1200       Response to Exercise   Blood Pressure (Admit) 130/66 110/64 130/70 112/64    Blood Pressure (Exercise) 174/80 128/70 170/80 120/70    Blood Pressure (Exit) 104/64 98/56 112/60 102/64    Heart Rate (Admit) 60 bpm 58 bpm 64 bpm 63 bpm    Heart Rate (Exercise) 73 bpm 84 bpm 82 bpm 81 bpm    Heart Rate (Exit) 62  bpm 70 bpm 66 bpm 65 bpm    Oxygen Saturation (Admit) 99 % 97 % 99 % 98 %    Oxygen Saturation (Exercise) 93 % 95 % 96 % 95 %    Oxygen Saturation (Exit) 97 % 96 % 99 % 96 %    Rating of Perceived Exertion (Exercise) 11 11 11 12     Perceived Dyspnea (Exercise) 1 1 1 1     Duration Progress to 30 minutes of  aerobic without signs/symptoms of physical distress Progress to 30 minutes of  aerobic without signs/symptoms of physical distress Continue with 30 min of aerobic exercise without signs/symptoms of physical distress. Continue with 30 min of aerobic exercise without signs/symptoms of physical distress.    Intensity THRR unchanged THRR unchanged THRR unchanged THRR unchanged      Resistance Training   Training Prescription Yes Yes Yes Yes    Weight red bands red bands red bands black bands    Reps 10-15 10-15 10-15 10-15    Time 10 Minutes 10 Minutes 10 Minutes 10 Minutes      Treadmill   MPH -- -- 1.5 1.7    Grade -- -- 0 1    Minutes -- -- 15 215    METs -- -- 2 2.4      Recumbant Elliptical   Level 1 2 3 3     Minutes 15 15 15 15     METs 2 2.5 2.1 2.7      Track   Laps 13 14 -- --    Minutes 15 15 -- --    METs 2.62 2.75 -- --       Exercise Comments:   Exercise Goals and Review:   Exercise Goals     Row Name 11/25/23 0927             Exercise Goals   Increase Physical Activity Yes       Intervention Provide advice, education, support and  counseling about physical activity/exercise needs.;Develop an individualized exercise prescription for aerobic and resistive training based on initial evaluation findings, risk stratification, comorbidities and participant's personal goals.       Expected Outcomes Short Term: Attend rehab on a regular basis to increase amount of physical activity.;Long Term: Add in home exercise to make exercise part of routine and to increase amount of physical activity.;Long Term: Exercising regularly at least 3-5 days a week.       Increase  Strength and Stamina Yes       Intervention Provide advice, education, support and counseling about physical activity/exercise needs.;Develop an individualized exercise prescription for aerobic and resistive training based on initial evaluation findings, risk stratification, comorbidities and participant's personal goals.       Expected Outcomes Short Term: Increase workloads from initial exercise prescription for resistance, speed, and METs.;Short Term: Perform resistance training exercises routinely during rehab and add in resistance training at home;Long Term: Improve cardiorespiratory fitness, muscular endurance and strength as measured by increased METs and functional capacity ( )       Able to understand and use rate of perceived exertion (RPE) scale Yes       Intervention Provide education and explanation on how to use RPE scale       Expected Outcomes Short Term: Able to use RPE daily in rehab to express subjective intensity level;Long Term:  Able to use RPE to guide intensity level when exercising independently       Able to understand and use Dyspnea scale Yes       Intervention Provide education and explanation on how to use Dyspnea scale       Expected Outcomes Short Term: Able to use Dyspnea scale daily in rehab to express subjective sense of shortness of breath during exertion;Long Term: Able to use Dyspnea scale to guide intensity level when exercising independently       Knowledge and understanding of Target Heart Rate Range (THRR) Yes       Intervention Provide education and explanation of THRR including how the numbers were predicted and where they are located for reference       Expected Outcomes Short Term: Able to state/look up THRR;Long Term: Able to use THRR to govern intensity when exercising independently;Short Term: Able to use daily as guideline for intensity in rehab       Understanding of Exercise Prescription Yes       Intervention Provide education, explanation, and  written materials on patient's individual exercise prescription       Expected Outcomes Short Term: Able to explain program exercise prescription;Long Term: Able to explain home exercise prescription to exercise independently          Exercise Goals Re-Evaluation :  Exercise Goals Re-Evaluation     Row Name 12/04/23 1048 01/01/24 0930           Exercise Goal Re-Evaluation   Exercise Goals Review Increase Physical Activity;Able to understand and use Dyspnea scale;Understanding of Exercise Prescription;Increase Strength and Stamina;Knowledge and understanding of Target Heart Rate Range (THRR);Able to understand and use rate of perceived exertion (RPE) scale Increase Physical Activity;Able to understand and use Dyspnea scale;Understanding of Exercise Prescription;Increase Strength and Stamina;Knowledge and understanding of Target Heart Rate Range (THRR);Able to understand and use rate of perceived exertion (RPE) scale      Comments Deatrice has completed 2 exercise sessions. He exercises for 15 min on the track and Octane. He averages 2.75 METs on the track and 2.6 METs at level  1 on the Octane. He performs the warmup and cooldown standing without limitations. It is too soon to notate and disernable progressions. Will continue to monitor and progress as able. Deatrice has completed 10 exercise sessions. He exercises for 15 min on the treadmill and Octane. He averages 2.1 METs at 1.6 mph on the treadmill and 2.2 METs at level 3 on the Octane. He performs the warmup and cooldown standing. Deatrice has progressed for the track to the treadmill. He tolerates the treadmill well. Deatrice has also increased his level on the Octane. Deatrice is motivated to exercise and agreeable to increase his workloads. Will continue to monitor and progress as able.      Expected Outcomes Through exercise at rehab and home, the patient will decrease shortness of breath with daily activities and feel confident in carrying out an exercise  regimen at home. Through exercise at rehab and home, the patient will decrease shortness of breath with daily activities and feel confident in carrying out an exercise regimen at home.         Discharge Exercise Prescription (Final Exercise Prescription Changes):  Exercise Prescription Changes - 01/12/24 1200       Response to Exercise   Blood Pressure (Admit) 112/64    Blood Pressure (Exercise) 120/70    Blood Pressure (Exit) 102/64    Heart Rate (Admit) 63 bpm    Heart Rate (Exercise) 81 bpm    Heart Rate (Exit) 65 bpm    Oxygen Saturation (Admit) 98 %    Oxygen Saturation (Exercise) 95 %    Oxygen Saturation (Exit) 96 %    Rating of Perceived Exertion (Exercise) 12    Perceived Dyspnea (Exercise) 1    Duration Continue with 30 min of aerobic exercise without signs/symptoms of physical distress.    Intensity THRR unchanged      Resistance Training   Training Prescription Yes    Weight black bands    Reps 10-15    Time 10 Minutes      Treadmill   MPH 1.7    Grade 1    Minutes 215    METs 2.4      Recumbant Elliptical   Level 3    Minutes 15    METs 2.7          Nutrition:  Target Goals: Understanding of nutrition guidelines, daily intake of sodium 1500mg , cholesterol 200mg , calories 30% from fat and 7% or less from saturated fats, daily to have 5 or more servings of fruits and vegetables.  Biometrics:  Pre Biometrics - 11/25/23 0921       Pre Biometrics   Grip Strength 30 kg           Nutrition Therapy Plan and Nutrition Goals:   Nutrition Assessments:  MEDIFICTS Score Key: >=70 Need to make dietary changes  40-70 Heart Healthy Diet <= 40 Therapeutic Level Cholesterol Diet   Picture Your Plate Scores: <59 Unhealthy dietary pattern with much room for improvement. 41-50 Dietary pattern unlikely to meet recommendations for good health and room for improvement. 51-60 More healthful dietary pattern, with some room for improvement.  >60 Healthy  dietary pattern, although there may be some specific behaviors that could be improved.    Nutrition Goals Re-Evaluation:   Nutrition Goals Discharge (Final Nutrition Goals Re-Evaluation):   Psychosocial: Target Goals: Acknowledge presence or absence of significant depression and/or stress, maximize coping skills, provide positive support system. Participant is able to verbalize types and ability to use techniques  and skills needed for reducing stress and depression.  Initial Review & Psychosocial Screening:  Initial Psych Review & Screening - 11/25/23 0931       Initial Review   Current issues with None Identified      Family Dynamics   Good Support System? Yes    Comments spouse and 2 children      Barriers   Psychosocial barriers to participate in program There are no identifiable barriers or psychosocial needs.      Screening Interventions   Interventions Encouraged to exercise          Quality of Life Scores:  Scores of 19 and below usually indicate a poorer quality of life in these areas.  A difference of  2-3 points is a clinically meaningful difference.  A difference of 2-3 points in the total score of the Quality of Life Index has been associated with significant improvement in overall quality of life, self-image, physical symptoms, and general health in studies assessing change in quality of life.  PHQ-9: Review Flowsheet  More data exists      11/25/2023 11/19/2023 04/28/2023 12/01/2022 11/29/2021  Depression screen PHQ 2/9  Decreased Interest 0 0 0 0 0  Down, Depressed, Hopeless 0 0 0 0 0  PHQ - 2 Score 0 0 0 0 0  Altered sleeping 0 0 - 0 0  Tired, decreased energy 0 0 - 0 0  Change in appetite 0 0 - 0 0  Feeling bad or failure about yourself  0 0 - 0 0  Trouble concentrating 0 0 - 0 0  Moving slowly or fidgety/restless 0 1 - 0 0  Suicidal thoughts 0 0 - 0 0  PHQ-9 Score 0 1 - 0 0  Difficult doing work/chores Not difficult at all Not difficult at all - Not  difficult at all Not difficult at all   Interpretation of Total Score  Total Score Depression Severity:  1-4 = Minimal depression, 5-9 = Mild depression, 10-14 = Moderate depression, 15-19 = Moderately severe depression, 20-27 = Severe depression   Psychosocial Evaluation and Intervention:  Psychosocial Evaluation - 11/25/23 0932       Psychosocial Evaluation & Interventions   Interventions Encouraged to exercise with the program and follow exercise prescription    Comments Deatrice denies any psychosocial barriers at this time.    Expected Outcomes For Deatrice to participate in PR free of psychosocial barriers.    Continue Psychosocial Services  No Follow up required          Psychosocial Re-Evaluation:  Psychosocial Re-Evaluation     Row Name 12/07/23 1430 01/04/24 0858           Psychosocial Re-Evaluation   Current issues with None Identified None Identified      Comments Deatrice continues to deny any new psychosocial barriers or concerns at this time. Deatrice continues to deny any new psychosocial barriers or concerns at this time.      Expected Outcomes For Deatrice to participate in PR free of any psychosocial barriers or concerns For Deatrice to participate in PR free of any psychosocial barriers or concerns      Interventions Encouraged to attend Pulmonary Rehabilitation for the exercise Encouraged to attend Pulmonary Rehabilitation for the exercise      Continue Psychosocial Services  No Follow up required No Follow up required         Psychosocial Discharge (Final Psychosocial Re-Evaluation):  Psychosocial Re-Evaluation - 01/04/24 9141  Psychosocial Re-Evaluation   Current issues with None Identified    Comments Deatrice continues to deny any new psychosocial barriers or concerns at this time.    Expected Outcomes For Deatrice to participate in PR free of any psychosocial barriers or concerns    Interventions Encouraged to attend Pulmonary Rehabilitation for the exercise    Continue  Psychosocial Services  No Follow up required          Education: Education Goals: Education classes will be provided on a weekly basis, covering required topics. Participant will state understanding/return demonstration of topics presented.  Learning Barriers/Preferences:  Learning Barriers/Preferences - 11/25/23 0934       Learning Barriers/Preferences   Learning Barriers Sight    Learning Preferences None          Education Topics: Know Your Numbers Group instruction that is supported by a PowerPoint presentation. Instructor discusses importance of knowing and understanding resting, exercise, and post-exercise oxygen saturation, heart rate, and blood pressure. Oxygen saturation, heart rate, blood pressure, rating of perceived exertion, and dyspnea are reviewed along with a normal range for these values.  Flowsheet Row PULMONARY REHAB OTHER RESPIRATORY from 12/03/2023 in [facility] for Heart, Vascular, & Lung Health  Date 12/03/23  Educator EP  Instruction Review Code 1- Verbalizes Understanding    Exercise for the Pulmonary Patient Group instruction that is supported by a PowerPoint presentation. Instructor discusses benefits of exercise, core components of exercise, frequency, duration, and intensity of an exercise routine, importance of utilizing pulse oximetry during exercise, safety while exercising, and options of places to exercise outside of rehab.    MET Level  Group instruction provided by PowerPoint, verbal discussion, and written material to support subject matter. Instructor reviews what METs are and how to increase METs.    Pulmonary Medications Verbally interactive group education provided by instructor with focus on inhaled medications and proper administration.   Anatomy and Physiology of the Respiratory System Group instruction provided by PowerPoint, verbal discussion, and written material to support subject matter. Instructor  reviews respiratory cycle and anatomical components of the respiratory system and their functions. Instructor also reviews differences in obstructive and restrictive respiratory diseases with examples of each.    Oxygen Safety Group instruction provided by PowerPoint, verbal discussion, and written material to support subject matter. There is an overview of "What is Oxygen" and "Why do we need it".  Instructor also reviews how to create a safe environment for oxygen use, the importance of using oxygen as prescribed, and the risks of noncompliance. There is a brief discussion on traveling with oxygen and resources the patient may utilize. Flowsheet Row PULMONARY REHAB OTHER RESPIRATORY from 12/17/2023 in [facility] for Heart, Vascular, & Lung Health  Date 12/17/23  Educator RT  Instruction Review Code 1- Verbalizes Understanding    Oxygen Use Group instruction provided by PowerPoint, verbal discussion, and written material to discuss how supplemental oxygen is prescribed and different types of oxygen supply systems. Resources for more information are provided.    Breathing Techniques Group instruction that is supported by demonstration and informational handouts. Instructor discusses the benefits of pursed lip and diaphragmatic breathing and detailed demonstration on how to perform both.  Flowsheet Row PULMONARY REHAB OTHER RESPIRATORY from 12/24/2023 in [facility] for Heart, Vascular, & Lung Health  Date 12/24/23  Educator RN  Instruction Review Code 1- Verbalizes Understanding     Risk Factor Reduction Group instruction that is supported  by a PowerPoint presentation. Instructor discusses the definition of a risk factor, different risk factors for pulmonary disease, and how the heart and lungs work together.   Pulmonary Diseases Group instruction provided by PowerPoint, verbal discussion, and written material to support subject matter.  Instructor gives an overview of the different type of pulmonary diseases. There is also a discussion on risk factors and symptoms as well as ways to manage the diseases.   Stress and Energy Conservation Group instruction provided by PowerPoint, verbal discussion, and written material to support subject matter. Instructor gives an overview of stress and the impact it can have on the body. Instructor also reviews ways to reduce stress. There is also a discussion on energy conservation and ways to conserve energy throughout the day. Flowsheet Row PULMONARY REHAB OTHER RESPIRATORY from 12/31/2023 in [facility] for Heart, Vascular, & Lung Health  Date 12/31/23  Educator RN  Instruction Review Code 1- Verbalizes Understanding    Warning Signs and Symptoms Group instruction provided by PowerPoint, verbal discussion, and written material to support subject matter. Instructor reviews warning signs and symptoms of stroke, heart attack, cold and flu. Instructor also reviews ways to prevent the spread of infection. Flowsheet Row PULMONARY REHAB OTHER RESPIRATORY from 01/07/2024 in [facility] for Heart, Vascular, & Lung Health  Date 01/07/24  Educator RN  Instruction Review Code 1- Verbalizes Understanding    Other Education Group or individual verbal, written, or video instructions that support the educational goals of the pulmonary rehab program.    Knowledge Questionnaire Score:  Knowledge Questionnaire Score - 11/25/23 1105       Knowledge Questionnaire Score   Pre Score 15/18          Core Components/Risk Factors/Patient Goals at Admission:  Personal Goals and Risk Factors at Admission - 11/25/23 0934       Core Components/Risk Factors/Patient Goals on Admission   Improve shortness of breath with ADL's Yes    Intervention Provide education, individualized exercise plan and daily activity instruction to help decrease symptoms of SOB  with activities of daily living.    Expected Outcomes Short Term: Improve cardiorespiratory fitness to achieve a reduction of symptoms when performing ADLs;Long Term: Be able to perform more ADLs without symptoms or delay the onset of symptoms          Core Components/Risk Factors/Patient Goals Review:   Goals and Risk Factor Review     Row Name 12/07/23 1431 01/04/24 0858           Core Components/Risk Factors/Patient Goals Review   Personal Goals Review Improve shortness of breath with ADL's;Develop more efficient breathing techniques such as purse lipped breathing and diaphragmatic breathing and practicing self-pacing with activity. Improve shortness of breath with ADL's;Develop more efficient breathing techniques such as purse lipped breathing and diaphragmatic breathing and practicing self-pacing with activity.      Review Monthly review of patient's Core Components/Risk Factors/Patient Goals are as follows: Goal progressing for improving shortness of breath. Goal progressing for developing more efficient breathing techniques such as purse lipped breathing and diaphragmatic breathing; and practicing self-pacing with activity. Deatrice is currently exercising on RA to keep sats >88%. We will continue to monitor his progress throughout the program. Monthly review of patient's Core Components/Risk Factors/Patient Goals are as follows: Goal progressing for improving shortness of breath with ADL's. Goal progressing for developing more efficient breathing techniques such as purse lipped breathing and diaphragmatic breathing; and practicing self-pacing with  activity. Deatrice has attended the breathing technique class and is using purse lip breathing when he gets SOB. Deatrice is currently exercising on RA to keep sats >88%. We will continue to monitor his progress throughout the program.      Expected Outcomes To improve shortness of breath with ADL's and develop more efficient breathing techniques such as  purse lipped breathing and diaphragmatic breathing; and practicing self-pacing with activity To improve shortness of breath with ADL's and develop more efficient breathing techniques such as purse lipped breathing and diaphragmatic breathing; and practicing self-pacing with activity         Core Components/Risk Factors/Patient Goals at Discharge (Final Review):   Goals and Risk Factor Review - 01/04/24 0858       Core Components/Risk Factors/Patient Goals Review   Personal Goals Review Improve shortness of breath with ADL's;Develop more efficient breathing techniques such as purse lipped breathing and diaphragmatic breathing and practicing self-pacing with activity.    Review Monthly review of patient's Core Components/Risk Factors/Patient Goals are as follows: Goal progressing for improving shortness of breath with ADL's. Goal progressing for developing more efficient breathing techniques such as purse lipped breathing and diaphragmatic breathing; and practicing self-pacing with activity. Deatrice has attended the breathing technique class and is using purse lip breathing when he gets SOB. Deatrice is currently exercising on RA to keep sats >88%. We will continue to monitor his progress throughout the program.    Expected Outcomes To improve shortness of breath with ADL's and develop more efficient breathing techniques such as purse lipped breathing and diaphragmatic breathing; and practicing self-pacing with activity          ITP Comments:   Comments: Pt is making expected progress toward Pulmonary Rehab goals after completing 13 session(s). Recommend continued exercise, life style modification, education, and utilization of breathing techniques to increase stamina and strength, while also decreasing shortness of breath with exertion.  Dr. Slater Staff is Medical Director for Pulmonary Rehab at [facility].

## 2024-01-14 ENCOUNTER — Encounter (HOSPITAL_COMMUNITY)
Admission: RE | Admit: 2024-01-14 | Discharge: 2024-01-14 | Disposition: A | Source: Ambulatory Visit | Attending: Pulmonary Disease

## 2024-01-14 DIAGNOSIS — J432 Centrilobular emphysema: Secondary | ICD-10-CM

## 2024-01-14 NOTE — Progress Notes (Signed)
 Daily Session Note  Patient Details  Name: MATEUSZ NEILAN MRN: [id number] Date of Birth: 15-Aug-1950 Referring Provider:   Conrad Ports Pulmonary Rehab Walk Test from 11/25/2023 in [facility] for Heart, Vascular, & Lung Health  Referring Provider Assaker    Encounter Date: 01/14/2024  Check In:  Session Check In - 01/14/24 1038       Check-In   Supervising physician immediately available to respond to emergencies [facility] MD immediately available    Physician(s) Rosaline Bane, NP    Location [facility]    Staff Present Ronal Levin, RN, BSN;Casey Claudene, RT;Randi [facility] BS, ACSM-CEP, Exercise Physiologist;Kaylee Nicholaus, MS, ACSM-CEP, Exercise Physiologist    Virtual Visit No    Medication changes reported     No    Fall or balance concerns reported    No    Tobacco Cessation No Change    Warm-up and Cool-down Performed as group-led instruction    Resistance Training Performed Yes    VAD Patient? No    PAD/SET Patient? No      Pain Assessment   Currently in Pain? No/denies          Capillary Blood Glucose: No results found for this or any previous visit (from the past 24 hours).    Social History   Tobacco Use  Smoking Status Former   Current packs/day: 0.00   Average packs/day: 0.5 packs/day for 54.0 years (27.0 ttl pk-yrs)   Types: Cigarettes   Start date: 52   Quit date: 03/17/2022   Years since quitting: 1.8  Smokeless Tobacco Never  Tobacco Comments   11/30/19 .5-1pack per day    Goals Met:  Independence with exercise equipment Exercise tolerated well No report of concerns or symptoms today Strength training completed today  Goals Unmet:  Not Applicable  Comments: Service time is from 1015 to 1141    Dr. Slater Staff is Medical Director for Pulmonary Rehab at [facility].

## 2024-01-19 ENCOUNTER — Encounter (HOSPITAL_COMMUNITY)
Admission: RE | Admit: 2024-01-19 | Discharge: 2024-01-19 | Disposition: A | Discharge status: Home / Self Care | Source: Ambulatory Visit | Attending: Pulmonary Disease | Admitting: Pulmonary Disease

## 2024-01-19 DIAGNOSIS — J432 Centrilobular emphysema: Secondary | ICD-10-CM | POA: Diagnosis not present

## 2024-01-19 NOTE — Progress Notes (Signed)
 Daily Session Note  Patient Details  Name: [name] MRN: [id number] Date of Birth: January 30, 1951 Referring Provider:   Conrad Ports Pulmonary Rehab Walk Test from 11/25/2023 in [facility] for Heart, Vascular, & Lung Health  Referring Provider Assaker    Encounter Date: 01/19/2024  Check In:  Session Check In - 01/19/24 1031       Check-In   Supervising physician immediately available to respond to emergencies [facility] MD immediately available    Physician(s) Rosaline Bane, NP    [facility]    Staff Present Ronal Levin, RN, BSN;Wrangler Penning Claudene, RT;Randi [facility] BS, ACSM-CEP, Exercise Physiologist;Kaylee Nicholaus, MS, ACSM-CEP, Exercise Physiologist    Virtual Visit No    Medication changes reported     No    Fall or balance concerns reported    No    Tobacco Cessation No Change    Warm-up and Cool-down Performed as group-led instruction    Resistance Training Performed Yes    VAD Patient? No    PAD/SET Patient? No      Pain Assessment   Currently in Pain? No/denies          Capillary Blood Glucose: No results found for this or any previous visit (from the past 24 hours).    Social History   Tobacco Use  Smoking Status Former   Current packs/day: 0.00   Average packs/day: 0.5 packs/day for 54.0 years (27.0 ttl pk-yrs)   Types: Cigarettes   Start date: 36   Quit date: 03/17/2022   Years since quitting: 1.8  Smokeless Tobacco Never  Tobacco Comments   11/30/19 .5-1pack per day    Goals Met:  Proper associated with RPD/PD & O2 Sat Independence with exercise equipment Exercise tolerated well No report of concerns or symptoms today Strength training completed today  Goals Unmet:  Not Applicable  Comments: Service time is from 1016 to 1139.    Dr. Slater Staff is Medical Director for Pulmonary Rehab at [facility].

## 2024-01-19 NOTE — Progress Notes (Signed)
 Home Exercise Prescription I have reviewed a Home Exercise Prescription with [name]. [name] is not currently exercising at home. I encouraged him to start exercising 2 non-rehab days/wk. We discussed walking outside 2 days/wk for 30 min/day. I also mentioned weather considerations. He agreed with my recommendations. [name] seems motivated to exercise outside of rehab. The patient stated that their goals were to maintain health. We reviewed exercise guidelines, target heart rate during exercise, RPE Scale, weather conditions, endpoints for exercise, warmup and cool down. The patient is encouraged to come to me with any questions. I will continue to follow up with the patient to assist them with progression and safety. Spent 15 min with patient discussing home exercise plan and goals  Dennis JINNY Moats, MS, ACSM-CEP 01/19/2024 12:04 PM

## 2024-01-20 DIAGNOSIS — C61 Malignant neoplasm of prostate: Secondary | ICD-10-CM | POA: Diagnosis not present

## 2024-01-20 DIAGNOSIS — R351 Nocturia: Secondary | ICD-10-CM | POA: Diagnosis not present

## 2024-01-20 DIAGNOSIS — R972 Elevated prostate specific antigen [PSA]: Secondary | ICD-10-CM | POA: Diagnosis not present

## 2024-01-20 DIAGNOSIS — N401 Enlarged prostate with lower urinary tract symptoms: Secondary | ICD-10-CM | POA: Diagnosis not present

## 2024-01-21 ENCOUNTER — Encounter (HOSPITAL_COMMUNITY)
Admission: RE | Admit: 2024-01-21 | Discharge: 2024-01-21 | Disposition: A | Source: Ambulatory Visit | Attending: Pulmonary Disease

## 2024-01-21 DIAGNOSIS — J432 Centrilobular emphysema: Secondary | ICD-10-CM

## 2024-01-21 NOTE — Progress Notes (Signed)
 Daily Session Note  Patient Details  Name: [name] MRN: [id number] Date of Birth: [birth year] Referring Provider:   Conrad Ports Pulmonary Rehab Walk Test from 11/25/2023 in [facility] for Heart, Vascular, & Lung Health  Referring Provider Assaker    Encounter Date: 01/21/2024  Check In:  Session Check In - 01/21/24 1127       Check-In   Supervising physician immediately available to respond to emergencies [facility] MD immediately available    Physician(s) Orren Fabry, NP    [facility]    Staff Present Augustin Sharps, Neita Moats, MS, ACSM-CEP, Exercise Physiologist;Carlette Bernett, RN, Mallory Parkins, MS, ACSM-CEP, CCRP, Exercise Physiologist;Maria Cyrus, RN, BSN;Johnny Porter, MS, Exercise Physiologist    Virtual Visit No    Medication changes reported     No    Fall or balance concerns reported    No    Tobacco Cessation No Change    Warm-up and Cool-down Performed as group-led instruction    Resistance Training Performed Yes    VAD Patient? No    PAD/SET Patient? No      Pain Assessment   Currently in Pain? No/denies    Multiple Pain Sites No          Capillary Blood Glucose: No results found for this or any previous visit (from the past 24 hours).    Social History   Tobacco Use  Smoking Status Former   Current packs/day: 0.00   Average packs/day: 0.5 packs/day for 54.0 years (27.0 ttl pk-yrs)   Types: Cigarettes   Start date: 55   Quit date: 03/17/2022   Years since quitting: 1.8  Smokeless Tobacco Never  Tobacco Comments   11/30/19 .5-1pack per day    Goals Met:  Proper associated with RPD/PD & O2 Sat Independence with exercise equipment Exercise tolerated well No report of concerns or symptoms today Strength training completed today  Goals Unmet:  Not Applicable  Comments: Service time is from 1013 to 1143.    Dr. Slater Staff is Medical Director for Pulmonary Rehab at [facility].

## 2024-01-26 ENCOUNTER — Encounter (HOSPITAL_COMMUNITY)
Admission: RE | Admit: 2024-01-26 | Discharge: 2024-01-26 | Disposition: A | Discharge status: Home / Self Care | Source: Ambulatory Visit | Attending: Pulmonary Disease | Admitting: Pulmonary Disease

## 2024-01-26 VITALS — Wt 171.7 lb

## 2024-01-26 DIAGNOSIS — J432 Centrilobular emphysema: Secondary | ICD-10-CM | POA: Diagnosis not present

## 2024-01-26 NOTE — Progress Notes (Signed)
 Daily Session Note  Patient Details  Name: [name] MRN: [id number] Date of Birth: 04-12-1950 Referring Provider:   Conrad Ports Pulmonary Rehab Walk Test from 11/25/2023 in [facility] for Heart, Vascular, & Lung Health  Referring Provider Assaker    Encounter Date: 01/26/2024  Check In:  Session Check In - 01/26/24 1023       Check-In   Supervising physician immediately available to respond to emergencies [facility] MD immediately available    Physician(s) Barnie Press, NP    [facility]    Staff Present Augustin Sharps, Neita Moats, MS, ACSM-CEP, Exercise Physiologist;Randi Midge HECKLE, ACSM-CEP, Exercise Physiologist;Mary Harvy, RN, BSN    Virtual Visit No    Medication changes reported     No    Fall or balance concerns reported    No    Tobacco Cessation No Change    Warm-up and Cool-down Performed as group-led instruction    Resistance Training Performed Yes    VAD Patient? No    PAD/SET Patient? No      Pain Assessment   Currently in Pain? No/denies          Capillary Blood Glucose: No results found for this or any previous visit (from the past 24 hours).   Exercise Prescription Changes - 01/26/24 1200       Response to Exercise   Blood Pressure (Admit) 126/60    Blood Pressure (Exercise) 140/70    Blood Pressure (Exit) 120/60    Heart Rate (Admit) 70 bpm    Heart Rate (Exercise) 94 bpm    Heart Rate (Exit) 72 bpm    Oxygen Saturation (Admit) 98 %    Oxygen Saturation (Exercise) 93 %    Oxygen Saturation (Exit) 97 %    Rating of Perceived Exertion (Exercise) 11    Perceived Dyspnea (Exercise) 1    Duration Continue with 30 min of aerobic exercise without signs/symptoms of physical distress.    Intensity THRR unchanged      Progression   Progression Continue to progress workloads to maintain intensity without signs/symptoms of physical distress.      Resistance Training   Training Prescription  Yes    Weight black bands    Reps 10-15    Time 10 Minutes      Treadmill   MPH 1.9    Grade 1    Minutes 15    METs 2.5      Recumbant Elliptical   Level 4    Minutes 15    METs 2.7      Home Exercise Plan   Plans to continue exercise at Home (comment)    Frequency Add 2 additional days to program exercise sessions.    Initial Home Exercises Provided 01/26/24          Social History   Tobacco Use  Smoking Status Former   Current packs/day: 0.00   Average packs/day: 0.5 packs/day for 54.0 years (27.0 ttl pk-yrs)   Types: Cigarettes   Start date: 9   Quit date: 03/17/2022   Years since quitting: 1.8  Smokeless Tobacco Never  Tobacco Comments   11/30/19 .5-1pack per day    Goals Met:  Proper associated with RPD/PD & O2 Sat Independence with exercise equipment Exercise tolerated well No report of concerns or symptoms today Strength training completed today  Goals Unmet:  Not Applicable  Comments: Service time is from 1013 to 1136.    Dr. Slater Staff  is Wellsite Geologist for Pulmonary Rehab at [facility].

## 2024-01-28 ENCOUNTER — Encounter (HOSPITAL_COMMUNITY)
Admission: RE | Admit: 2024-01-28 | Discharge: 2024-01-28 | Disposition: A | Discharge status: Home / Self Care | Source: Ambulatory Visit | Attending: Pulmonary Disease | Admitting: Pulmonary Disease

## 2024-01-28 DIAGNOSIS — J432 Centrilobular emphysema: Secondary | ICD-10-CM

## 2024-01-28 NOTE — Progress Notes (Signed)
 Daily Session Note  Patient Details  Name: [name] MRN: [id number] Date of Birth: 10-09-1950 Referring Provider:   Conrad Ports Pulmonary Rehab Walk Test from 11/25/2023 in [facility] for Heart, Vascular, & Lung Health  Referring Provider Assaker    Encounter Date: 01/28/2024  Check In:  Session Check In - 01/28/24 1035       Check-In   Supervising physician immediately available to respond to emergencies [facility] MD immediately available    Physician(s) Barnie Press, NP    [facility]    Staff Present Augustin Sharps, Neita Moats, MS, ACSM-CEP, Exercise Physiologist;Mary Harvy, RN, BSN;Carlette Bernett, RN, BSN;Theadora Byes, RN, MHA;Kizer Makemson, MS, ACSM-CEP, CCRP, Exercise Physiologist    Virtual Visit No    Medication changes reported     No    Fall or balance concerns reported    No    Tobacco Cessation No Change    Warm-up and Cool-down Performed as group-led instruction    Resistance Training Performed Yes    VAD Patient? No    PAD/SET Patient? No      Pain Assessment   Currently in Pain? No/denies    Multiple Pain Sites No          Capillary Blood Glucose: No results found for this or any previous visit (from the past 24 hours).    Social History   Tobacco Use  Smoking Status Former   Current packs/day: 0.00   Average packs/day: 0.5 packs/day for 54.0 years (27.0 ttl pk-yrs)   Types: Cigarettes   Start date: 24   Quit date: 03/17/2022   Years since quitting: 1.8  Smokeless Tobacco Never  Tobacco Comments   11/30/19 .5-1pack per day    Goals Met:  Proper associated with RPD/PD & O2 Sat Independence with exercise equipment Exercise tolerated well No report of concerns or symptoms today Strength training completed today  Goals Unmet:  Not Applicable  Comments: Service time is from 1001 to 1145.    Dr. Slater Staff is Medical Director for Pulmonary Rehab at [facility].

## 2024-02-02 ENCOUNTER — Encounter (HOSPITAL_COMMUNITY)
Admission: RE | Admit: 2024-02-02 | Discharge: 2024-02-02 | Disposition: A | Discharge status: Home / Self Care | Source: Ambulatory Visit | Attending: Pulmonary Disease | Admitting: Pulmonary Disease

## 2024-02-02 DIAGNOSIS — J432 Centrilobular emphysema: Secondary | ICD-10-CM | POA: Diagnosis not present

## 2024-02-02 NOTE — Progress Notes (Signed)
 Daily Session Note  Patient Details  Name: [name] MRN: [id number] Date of Birth: 02/08/1951 Referring Provider:   Conrad Ports Pulmonary Rehab Walk Test from 11/25/2023 in [facility] for Heart, Vascular, & Lung Health  Referring Provider Assaker    Encounter Date: 02/02/2024  Check In:  Session Check In - 02/02/24 1118       Check-In   Supervising physician immediately available to respond to emergencies [facility] MD immediately available    Physician(s) Josefa Beauvais, NP    [facility]    Staff Present Augustin Sharps, Neita Moats, MS, ACSM-CEP, Exercise Physiologist;Mary Harvy, RN, BSN;Randi Midge BS, ACSM-CEP, Exercise Physiologist;Maria Whitaker, RN, BSN    Virtual Visit No    Medication changes reported     No    Fall or balance concerns reported    No    Tobacco Cessation No Change    Warm-up and Cool-down Performed as group-led Writer Performed Yes    VAD Patient? No    PAD/SET Patient? No      Pain Assessment   Currently in Pain? No/denies    Multiple Pain Sites No          Capillary Blood Glucose: No results found for this or any previous visit (from the past 24 hours).    Social History   Tobacco Use  Smoking Status Former   Current packs/day: 0.00   Average packs/day: 0.5 packs/day for 54.0 years (27.0 ttl pk-yrs)   Types: Cigarettes   Start date: 43   Quit date: 03/17/2022   Years since quitting: 1.8  Smokeless Tobacco Never  Tobacco Comments   11/30/19 .5-1pack per day    Goals Met:  Proper associated with RPD/PD & O2 Sat Independence with exercise equipment Exercise tolerated well No report of concerns or symptoms today Strength training completed today  Goals Unmet:  Not Applicable  Comments: Service time is from 1007 to 1136.    Dr. Slater Staff is Medical Director for Pulmonary Rehab at [facility].

## 2024-02-04 ENCOUNTER — Encounter (HOSPITAL_COMMUNITY)
Admission: RE | Admit: 2024-02-04 | Discharge: 2024-02-04 | Disposition: A | Source: Ambulatory Visit | Attending: Pulmonary Disease

## 2024-02-04 DIAGNOSIS — J432 Centrilobular emphysema: Secondary | ICD-10-CM

## 2024-02-04 NOTE — Progress Notes (Signed)
 Daily Session Note  Patient Details  Name: KRISHANG READING MRN: [id number] Date of Birth: 1950/10/10 Referring Provider:   Conrad Ports Pulmonary Rehab Walk Test from 11/25/2023 in [facility]  Referring Provider [place]    Encounter Date: 02/04/2024  Check In:  Session Check In - 02/04/24 1035       Check-In   Supervising physician immediately available to respond to emergencies [facility] MD immediately available    Physician(s) Orren Fabry, PA    Location [facility]    Staff Present Augustin Sharps, Neita Moats, MS, ACSM-CEP, Exercise Physiologist;Lorain Keast Harvy, RN, BSN;Randi Reeve BS, ACSM-CEP, Exercise Physiologist    Virtual Visit No    Medication changes reported     No    Fall or balance concerns reported    No    Tobacco Cessation No Change    Warm-up and Cool-down Performed as group-led instruction    Resistance Training Performed Yes    VAD Patient? No    PAD/SET Patient? No      Pain Assessment   Currently in Pain? No/denies          Capillary Blood Glucose: No results found for this or any previous visit (from the past 24 hours).    Social History   Tobacco Use  Smoking Status Former   Current packs/day: 0.00   Average packs/day: 0.5 packs/day for 54.0 years (27.0 ttl pk-yrs)   Types: Cigarettes   Start date: 77   Quit date: 03/17/2022   Years since quitting: 1.8  Smokeless Tobacco Never  Tobacco Comments   11/30/19 .5-1pack per day    Goals Met:  Independence with exercise equipment Exercise tolerated well Queuing for purse lip breathing No report of concerns or symptoms today Strength training completed today  Goals Unmet:  Not Applicable  Comments: Service time is from 1004 to 1147    Dr. Slater Staff is Medical Director for Pulmonary Rehab at [facility].

## 2024-02-09 ENCOUNTER — Encounter (HOSPITAL_COMMUNITY)
Admission: RE | Admit: 2024-02-09 | Discharge: 2024-02-09 | Disposition: A | Discharge status: Home / Self Care | Source: Ambulatory Visit | Attending: Pulmonary Disease | Admitting: Pulmonary Disease

## 2024-02-09 VITALS — Wt 169.5 lb

## 2024-02-09 DIAGNOSIS — J432 Centrilobular emphysema: Secondary | ICD-10-CM

## 2024-02-09 NOTE — Progress Notes (Signed)
 Daily Session Note  Patient Details  Name: CLIDE REMMERS MRN: [id number] Date of Birth: [birth year] Referring Provider:   Conrad Ports Pulmonary Rehab Walk Test from 11/25/2023 in [facility]  Referring Provider [place]aker    Encounter Date: 02/09/2024  Check In:  Session Check In - 02/09/24 1032       Check-In   Supervising physician immediately available to respond to emergencies [facility] MD immediately available    Physician(s) Rosabel Mose, NP    Location [facility]    Staff Present Augustin Sharps, Neita Moats, MS, ACSM-CEP, Exercise Physiologist;Mary Harvy, RN, BSN;Randi Reeve BS, ACSM-CEP, Exercise Physiologist    Virtual Visit No    Medication changes reported     No    Fall or balance concerns reported    No    Tobacco Cessation No Change    Warm-up and Cool-down Performed as group-led instruction    Resistance Training Performed Yes    VAD Patient? No    PAD/SET Patient? No      Pain Assessment   Currently in Pain? No/denies          Capillary Blood Glucose: No results found for this or any previous visit (from the past 24 hours).   Exercise Prescription Changes - 02/09/24 1200       Response to Exercise   Blood Pressure (Admit) 123/63    Blood Pressure (Exercise) 195/80    Blood Pressure (Exit) 118/66    Heart Rate (Admit) 62 bpm    Heart Rate (Exercise) 103 bpm    Heart Rate (Exit) 70 bpm    Oxygen Saturation (Admit) 97 %    Oxygen Saturation (Exercise) 91 %    Oxygen Saturation (Exit) 97 %    Rating of Perceived Exertion (Exercise) 13    Perceived Dyspnea (Exercise) 2    Duration Continue with 30 min of aerobic exercise without signs/symptoms of physical distress.    Intensity THRR unchanged      Progression   Progression Continue to progress workloads to maintain intensity without signs/symptoms of physical distress.      Resistance Training   Training Prescription Yes     Weight black bands    Reps 10-15    Time 10 Minutes      Treadmill   MPH 1.9    Grade 1.5    Minutes 15    METs 2.5      Recumbant Elliptical   Level 5    Minutes 15    METs 2.5          Social History   Tobacco Use  Smoking Status Former   Current packs/day: 0.00   Average packs/day: 0.5 packs/day for 54.0 years (27.0 ttl pk-yrs)   Types: Cigarettes   Start date: 89   Quit date: 03/17/2022   Years since quitting: 1.9  Smokeless Tobacco Never  Tobacco Comments   11/30/19 .5-1pack per day    Goals Met:  Proper associated with RPD/PD & O2 Sat Independence with exercise equipment Exercise tolerated well No report of concerns or symptoms today Strength training completed today  Goals Unmet:  Not Applicable  Comments: Service time is from 1019 to 1140.    Dr. Slater Staff is Medical Director for Pulmonary Rehab at [facility].

## 2024-02-10 NOTE — Progress Notes (Signed)
 Pulmonary Individual Treatment Plan  Patient Details  Name: [name] MRN: [id number] Date of Birth: Jun 14, 1950 Referring Provider:   Conrad Ports Pulmonary Rehab Walk Test from 11/25/2023 in [facility]  Referring Provider Assaker    Initial Encounter Date:  Flowsheet Row Pulmonary Rehab Walk Test from 11/25/2023 in [facility]  Date 11/25/23    Visit Diagnosis: Centrilobular emphysema (HCC)  Patient's Home Medications on Admission:   Current Outpatient Medications:    albuterol  (VENTOLIN  HFA) 108 (90 Base) MCG/ACT inhaler, INHALE TWO PUFFS BY MOUTH INTO LUNGS every FOUR hours AS NEEDED FOR WHEEZING AND/OR SHORTNESS OF BREATH, Disp: 8.5 g, Rfl: 2   albuterol  (VENTOLIN  HFA) 108 (90 Base) MCG/ACT inhaler, Inhale 2 puffs into the lungs every 6 (six) hours as needed for wheezing or shortness of breath., Disp: 8 g, Rfl: 6   amiodarone  (PACERONE ) 200 MG tablet, TAKE 1/2 TABLET BY MOUTH ONCE DAILY, Disp: 45 tablet, Rfl: 3   atorvastatin  (LIPITOR) 40 MG tablet, Take one tablet by mouth once daily, Disp: 90 tablet, Rfl: 3   b complex vitamins capsule, Take 1 capsule by mouth daily., Disp: , Rfl:    lisinopril  (ZESTRIL ) 5 MG tablet, Take 1 tablet (5 mg total) by mouth daily., Disp: 90 tablet, Rfl: 3   metoprolol  succinate (TOPROL -XL) 25 MG 24 hr tablet, Take 1 tablet (25 mg total) by mouth daily., Disp: 90 tablet, Rfl: 3   Multiple Vitamin (MULTIVITAMIN WITH MINERALS) TABS tablet, Take 1 tablet by mouth daily., Disp: 30 tablet, Rfl: 0   rivaroxaban  (XARELTO ) 20 MG TABS tablet, Take 1 tablet (20 mg total) by mouth daily., Disp: 90 tablet, Rfl: 1   TRELEGY ELLIPTA  100-62.5-25 MCG/ACT AEPB, INHALE 1 PUFF BY MOUTH/INTO LUNGS EVERY DAY. *NEW INS?*, Disp: 60 each, Rfl: 9  Past Medical History: Past Medical History:  Diagnosis Date   Adenomatous colon polyp    Alcohol use     Antiphospholipid antibody positive    ???In the past???but not proven according to patient   Antiphospholipid antibody positive    ??? in the past, but not proven according to the patient.   Atrial flutter (HCC)    atrial flutter ablation,   Dr.Allred October 18, 2010   BPH (benign prostatic hypertrophy)    CAD (coronary artery disease)    Catheterization 2005 disease / catheterization July 06, 2010, chronic total occlusion distal RCA with collaterals from right and left side.  Medical therapy recommended, mild decreased LV function        Carotid artery disease    0-39% R. ICA, 40-59% LICA... Doppler.... January, 2010  /  Doppler... in January, 2011 no change  /  doppler1/27/2012...stable  0-39% RICA 40-59% LICA   CHF (congestive heart failure) (HCC)    COPD (chronic obstructive pulmonary disease) (HCC)    CRAO (central retinal artery occlusion)    Ejection fraction     EF 50-55%, echo, March, 2012... inferobasal and distal septal hypokinesis /      60%, echo, November, 2008   Emphysema of lung [facility])    GERD (gastroesophageal reflux disease)    Hypercholesterolemia    Hypertension    Myocardial infarction (HCC)    Nonsustained ventricular tachycardia (HCC)    Palpitations    probable SVT, rate 170,, at home,, lasted one hour,, 2012   Prostate cancer (HCC)    Raynaud's syndrome    Retinal  artery occlusion    Stroke (HCC)    mild stroke per pt    TIA (transient ischemic attack)    Possible small vessel tia in the past.   Tobacco abuse     Tobacco Use: Social History   Tobacco Use  Smoking Status Former   Current packs/day: 0.00   Average packs/day: 0.5 packs/day for 54.0 years (27.0 ttl pk-yrs)   Types: Cigarettes   Start date: 71   Quit date: 03/17/2022   Years since quitting: 1.9  Smokeless Tobacco Never  Tobacco Comments   11/30/19 .5-1pack per day    Labs: Review Flowsheet  More data exists      Latest Ref Rng & Units 08/18/2018 09/22/2019 11/11/2021 11/19/2022  12/08/2023  Labs for ITP Cardiac and Pulmonary Rehab  Cholestrol 0 - 200 mg/dL 875  875  856  853  866   LDL (calc) 0 - 99 mg/dL 46  48  54  65  51   HDL-C >39.00 mg/dL 62  62  73  64  33.59   Trlycerides 0.0 - 149.0 mg/dL 81  66  83  89  19.9   Hemoglobin A1c 4.6 - 6.5 % - - - 5.5  5.7     Capillary Blood Glucose: Lab Results  Component Value Date   GLUCAP 65 (L) 08/04/2023     Pulmonary Assessment Scores:  Pulmonary Assessment Scores     Row Name 11/25/23 0935 01/21/24 1216       ADL UCSD   ADL Phase Entry Exit    SOB Score total 62 39      CAT Score   CAT Score 22 17      mMRC Score   mMRC Score 3 --      UCSD: Self-administered rating of dyspnea associated with activities of daily living (ADLs) 6-point scale (0 = not at all to 5 = maximal or unable to do because of breathlessness)  Scoring Scores range from 0 to 120.  Minimally important difference is 5 units  CAT: CAT can identify the health impairment of COPD patients and is better correlated with disease progression.  CAT has a scoring range of zero to 40. The CAT score is classified into four groups of low (less than 10), medium (10 - 20), high (21-30) and very high (31-40) based on the impact level of disease on health status. A CAT score over 10 suggests significant symptoms.  A worsening CAT score could be explained by an exacerbation, poor medication adherence, poor inhaler technique, or progression of COPD or comorbid conditions.  CAT MCID is 2 points  mMRC: mMRC (Modified Medical Research Council) Dyspnea Scale is used to assess the degree of baseline functional disability in patients of respiratory disease due to dyspnea. No minimal important difference is established. A decrease in score of 1 point or greater is considered a positive change.   Pulmonary Function Assessment:  Pulmonary Function Assessment - 11/25/23 0928       Breath   Shortness of Breath Yes;Limiting activity           Exercise Target Goals: Exercise Program Goal: Individual exercise prescription set using results from initial 6 min walk test and THRR while considering  patient's activity barriers and safety.   Exercise Prescription Goal: Initial exercise prescription builds to 30-45 minutes a day of aerobic activity, 2-3 days per week.  Home exercise guidelines will be given to patient during program as part of exercise prescription that the participant will  acknowledge.  Activity Barriers & Risk Stratification:  Activity Barriers & Cardiac Risk Stratification - 11/25/23 0938       Activity Barriers & Cardiac Risk Stratification   Activity Barriers Arthritis;Deconditioning;Muscular Weakness;Shortness of Breath          6 Minute Walk:  6 Minute Walk     Row Name 11/25/23 1036         6 Minute Walk   Phase Initial     Distance 1023 feet     Walk Time 6 minutes     # of Rest Breaks 0     MPH 1.94     METS 2.65     RPE 13     Perceived Dyspnea  1     VO2 Peak 9.27     Symptoms No     Resting HR 61 bpm     Resting BP 126/72     Resting Oxygen Saturation  97 %     Exercise Oxygen Saturation  during 6 min walk 93 %     Max Ex. HR 75 bpm     Max Ex. BP 144/70     2 Minute Post BP 132/70       Interval HR   1 Minute HR 65     2 Minute HR 70     3 Minute HR 73     4 Minute HR 73     5 Minute HR 75     6 Minute HR 75     2 Minute Post HR 61     Interval Heart Rate? Yes       Interval Oxygen   Interval Oxygen? Yes     Baseline Oxygen Saturation % 97 %     1 Minute Oxygen Saturation % 96 %     1 Minute Liters of Oxygen 0 L     2 Minute Oxygen Saturation % 94 %     2 Minute Liters of Oxygen 0 L     3 Minute Oxygen Saturation % 93 %     3 Minute Liters of Oxygen 0 L     4 Minute Oxygen Saturation % 94 %     4 Minute Liters of Oxygen 0 L     5 Minute Oxygen Saturation % 94 %     5 Minute Liters of Oxygen 0 L     6 Minute Oxygen Saturation % 93 %     6 Minute Liters of  Oxygen 0 L     2 Minute Post Oxygen Saturation % 98 %     2 Minute Post Liters of Oxygen 0 L        Oxygen Initial Assessment:  Oxygen Initial Assessment - 11/25/23 0928       Home Oxygen   Home Oxygen Device None    Sleep Oxygen Prescription None    Home Exercise Oxygen Prescription None    Home Resting Oxygen Prescription None      Initial 6 min Walk   Oxygen Used None      Program Oxygen Prescription   Program Oxygen Prescription None      Intervention   Short Term Goals To learn and understand importance of maintaining oxygen saturations>88%;To learn and demonstrate proper use of respiratory medications;To learn and understand importance of monitoring SPO2 with pulse oximeter and demonstrate accurate use of the pulse oximeter.;To learn and demonstrate proper pursed lip breathing techniques or other breathing techniques.     Long  Term Goals Verbalizes importance of monitoring SPO2 with pulse oximeter and return demonstration;Exhibits proper breathing techniques, such as pursed lip breathing or other method taught during program session;Maintenance of O2 saturations>88%;Compliance with respiratory medication;Demonstrates proper use of MDI's          Oxygen Re-Evaluation:  Oxygen Re-Evaluation     Row Name 12/04/23 1048 01/01/24 0933 02/05/24 1049         Program Oxygen Prescription   Program Oxygen Prescription None None None       Home Oxygen   Home Oxygen Device None None None     Sleep Oxygen Prescription None None None     Home Exercise Oxygen Prescription None None None     Home Resting Oxygen Prescription None None None       Goals/Expected Outcomes   Short Term Goals To learn and understand importance of maintaining oxygen saturations>88%;To learn and demonstrate proper use of respiratory medications;To learn and understand importance of monitoring SPO2 with pulse oximeter and demonstrate accurate use of the pulse oximeter.;To learn and demonstrate proper  pursed lip breathing techniques or other breathing techniques.  To learn and understand importance of maintaining oxygen saturations>88%;To learn and demonstrate proper use of respiratory medications;To learn and understand importance of monitoring SPO2 with pulse oximeter and demonstrate accurate use of the pulse oximeter.;To learn and demonstrate proper pursed lip breathing techniques or other breathing techniques.  To learn and understand importance of maintaining oxygen saturations>88%;To learn and demonstrate proper use of respiratory medications;To learn and understand importance of monitoring SPO2 with pulse oximeter and demonstrate accurate use of the pulse oximeter.;To learn and demonstrate proper pursed lip breathing techniques or other breathing techniques.      Long  Term Goals Verbalizes importance of monitoring SPO2 with pulse oximeter and return demonstration;Exhibits proper breathing techniques, such as pursed lip breathing or other method taught during program session;Maintenance of O2 saturations>88%;Compliance with respiratory medication;Demonstrates proper use of MDI's Verbalizes importance of monitoring SPO2 with pulse oximeter and return demonstration;Exhibits proper breathing techniques, such as pursed lip breathing or other method taught during program session;Maintenance of O2 saturations>88%;Compliance with respiratory medication;Demonstrates proper use of MDI's Verbalizes importance of monitoring SPO2 with pulse oximeter and return demonstration;Exhibits proper breathing techniques, such as pursed lip breathing or other method taught during program session;Maintenance of O2 saturations>88%;Compliance with respiratory medication;Demonstrates proper use of MDI's     Goals/Expected Outcomes Compliance and understanding of oxygen saturation monitoring and breathing techniques to decrease shortness of breath. Compliance and understanding of oxygen saturation monitoring and breathing  techniques to decrease shortness of breath. Compliance and understanding of oxygen saturation monitoring and breathing techniques to decrease shortness of breath.        Oxygen Discharge (Final Oxygen Re-Evaluation):  Oxygen Re-Evaluation - 02/05/24 1049       Program Oxygen Prescription   Program Oxygen Prescription None      Home Oxygen   Home Oxygen Device None    Sleep Oxygen Prescription None    Home Exercise Oxygen Prescription None    Home Resting Oxygen Prescription None      Goals/Expected Outcomes   Short Term Goals To learn and understand importance of maintaining oxygen saturations>88%;To learn and demonstrate proper use of respiratory medications;To learn and understand importance of monitoring SPO2 with pulse oximeter and demonstrate accurate use of the pulse oximeter.;To learn and demonstrate proper pursed lip breathing techniques or other breathing techniques.     Long  Term Goals Verbalizes importance of monitoring SPO2 with pulse  oximeter and return demonstration;Exhibits proper breathing techniques, such as pursed lip breathing or other method taught during program session;Maintenance of O2 saturations>88%;Compliance with respiratory medication;Demonstrates proper use of MDI's    Goals/Expected Outcomes Compliance and understanding of oxygen saturation monitoring and breathing techniques to decrease shortness of breath.          Initial Exercise Prescription:  Initial Exercise Prescription - 11/25/23 1000       Date of Initial Exercise RX and Referring Provider   Date 11/25/23    Referring Provider Assaker    Expected Discharge Date 02/18/24      Recumbant Elliptical   Level 1    RPM 35    Watts 40    Minutes 15    METs 1.4      Track   Minutes 15    METs 2      Prescription Details   Frequency (times per week) 2    Duration Progress to 30 minutes of continuous aerobic without signs/symptoms of physical distress      Intensity   THRR 40-80% of Max  Heartrate 59-118    Ratings of Perceived Exertion 11-13    Perceived Dyspnea 0-4      Progression   Progression Continue to progress workloads to maintain intensity without signs/symptoms of physical distress.      Resistance Training   Training Prescription Yes    Weight red bands    Reps 10-15          Perform Capillary Blood Glucose checks as needed.  Exercise Prescription Changes:   Exercise Prescription Changes     Row Name 12/01/23 1200 12/15/23 1200 12/29/23 1200 01/12/24 1200 01/26/24 1200     Response to Exercise   Blood Pressure (Admit) 130/66 110/64 130/70 112/64 126/60   Blood Pressure (Exercise) 174/80 128/70 170/80 120/70 140/70   Blood Pressure (Exit) 104/64 98/56 112/60 102/64 120/60   Heart Rate (Admit) 60 bpm 58 bpm 64 bpm 63 bpm 70 bpm   Heart Rate (Exercise) 73 bpm 84 bpm 82 bpm 81 bpm 94 bpm   Heart Rate (Exit) 62 bpm 70 bpm 66 bpm 65 bpm 72 bpm   Oxygen Saturation (Admit) 99 % 97 % 99 % 98 % 98 %   Oxygen Saturation (Exercise) 93 % 95 % 96 % 95 % 93 %   Oxygen Saturation (Exit) 97 % 96 % 99 % 96 % 97 %   Rating of Perceived Exertion (Exercise) 11 11 11 12 11    Perceived Dyspnea (Exercise) 1 1 1 1 1    Duration Progress to 30 minutes of  aerobic without signs/symptoms of physical distress Progress to 30 minutes of  aerobic without signs/symptoms of physical distress Continue with 30 min of aerobic exercise without signs/symptoms of physical distress. Continue with 30 min of aerobic exercise without signs/symptoms of physical distress. Continue with 30 min of aerobic exercise without signs/symptoms of physical distress.   Intensity THRR unchanged THRR unchanged THRR unchanged THRR unchanged THRR unchanged     Progression   Progression -- -- -- -- Continue to progress workloads to maintain intensity without signs/symptoms of physical distress.     Resistance Training   Training Prescription Yes Yes Yes Yes Yes   Weight red bands red bands red bands black  bands black bands   Reps 10-15 10-15 10-15 10-15 10-15   Time 10 Minutes 10 Minutes 10 Minutes 10 Minutes 10 Minutes     Treadmill   MPH -- -- 1.5 1.7  1.9   Grade -- -- 0 1 1   Minutes -- -- 15 215 15   METs -- -- 2 2.4 2.5     Recumbant Elliptical   Level 1 2 3 3 4    Minutes 15 15 15 15 15    METs 2 2.5 2.1 2.7 2.7     Track   Laps 13 14 -- -- --   Minutes 15 15 -- -- --   METs 2.62 2.75 -- -- --     Home Exercise Plan   Plans to continue exercise at -- -- -- -- Home (comment)   Frequency -- -- -- -- Add 2 additional days to program exercise sessions.   Initial Home Exercises Provided -- -- -- -- 01/26/24    Row Name 02/09/24 1200             Response to Exercise   Blood Pressure (Admit) 123/63       Blood Pressure (Exercise) 195/80       Blood Pressure (Exit) 118/66       Heart Rate (Admit) 62 bpm       Heart Rate (Exercise) 103 bpm       Heart Rate (Exit) 70 bpm       Oxygen Saturation (Admit) 97 %       Oxygen Saturation (Exercise) 91 %       Oxygen Saturation (Exit) 97 %       Rating of Perceived Exertion (Exercise) 13       Perceived Dyspnea (Exercise) 2       Duration Continue with 30 min of aerobic exercise without signs/symptoms of physical distress.       Intensity THRR unchanged         Progression   Progression Continue to progress workloads to maintain intensity without signs/symptoms of physical distress.         Resistance Training   Training Prescription Yes       Weight black bands       Reps 10-15       Time 10 Minutes         Treadmill   MPH 1.9       Grade 1.5       Minutes 15       METs 2.5         Recumbant Elliptical   Level 5       Minutes 15       METs 2.5          Exercise Comments:   Exercise Comments     Row Name 01/19/24 1159           Exercise Comments Completed home exercise plan. [name] is not currently exercising at home. I encouraged him to start exercising 2 non-rehab days/wk. We discussed walking outside 2  days/wk for 30 min/day. I also mentioned weather considerations. He agreed with my recommendations. [name] seems motivated to exercise outside of rehab.          Exercise Goals and Review:   Exercise Goals     Row Name 11/25/23 9072             Exercise Goals   Increase Physical Activity Yes       Intervention Provide advice, education, support and counseling about physical activity/exercise needs.;Develop an individualized exercise prescription for aerobic and resistive training based on initial evaluation findings, risk stratification, comorbidities and participant's personal goals.       Expected Outcomes  Short Term: Attend rehab on a regular basis to increase amount of physical activity.;Long Term: Add in home exercise to make exercise part of routine and to increase amount of physical activity.;Long Term: Exercising regularly at least 3-5 days a week.       Increase Strength and Stamina Yes       Intervention Provide advice, education, support and counseling about physical activity/exercise needs.;Develop an individualized exercise prescription for aerobic and resistive training based on initial evaluation findings, risk stratification, comorbidities and participant's personal goals.       Expected Outcomes Short Term: Increase workloads from initial exercise prescription for resistance, speed, and METs.;Short Term: Perform resistance training exercises routinely during rehab and add in resistance training at home;Long Term: Improve cardiorespiratory fitness, muscular endurance and strength as measured by increased METs and functional capacity ( )       Able to understand and use rate of perceived exertion (RPE) scale Yes       Intervention Provide education and explanation on how to use RPE scale       Expected Outcomes Short Term: Able to use RPE daily in rehab to express subjective intensity level;Long Term:  Able to use RPE to guide intensity level when exercising independently        Able to understand and use Dyspnea scale Yes       Intervention Provide education and explanation on how to use Dyspnea scale       Expected Outcomes Short Term: Able to use Dyspnea scale daily in rehab to express subjective sense of shortness of breath during exertion;Long Term: Able to use Dyspnea scale to guide intensity level when exercising independently       Knowledge and understanding of Target Heart Rate Range (THRR) Yes       Intervention Provide education and explanation of THRR including how the numbers were predicted and where they are located for reference       Expected Outcomes Short Term: Able to state/look up THRR;Long Term: Able to use THRR to govern intensity when exercising independently;Short Term: Able to use daily as guideline for intensity in rehab       Understanding of Exercise Prescription Yes       Intervention Provide education, explanation, and written materials on patient's individual exercise prescription       Expected Outcomes Short Term: Able to explain program exercise prescription;Long Term: Able to explain home exercise prescription to exercise independently          Exercise Goals Re-Evaluation :  Exercise Goals Re-Evaluation     Row Name 12/04/23 1048 01/01/24 0930 02/05/24 1047         Exercise Goal Re-Evaluation   Exercise Goals Review Increase Physical Activity;Able to understand and use Dyspnea scale;Understanding of Exercise Prescription;Increase Strength and Stamina;Knowledge and understanding of Target Heart Rate Range (THRR);Able to understand and use rate of perceived exertion (RPE) scale Increase Physical Activity;Able to understand and use Dyspnea scale;Understanding of Exercise Prescription;Increase Strength and Stamina;Knowledge and understanding of Target Heart Rate Range (THRR);Able to understand and use rate of perceived exertion (RPE) scale Increase Physical Activity;Able to understand and use Dyspnea scale;Understanding of Exercise  Prescription;Increase Strength and Stamina;Knowledge and understanding of Target Heart Rate Range (THRR);Able to understand and use rate of perceived exertion (RPE) scale     Comments [name] has completed 2 exercise sessions. He exercises for 15 min on the track and Octane. He averages 2.75 METs on the track and 2.6 METs at level 1 on  the Octane. He performs the warmup and cooldown standing without limitations. It is too soon to notate and disernable progressions. Will continue to monitor and progress as able. [name] has completed 10 exercise sessions. He exercises for 15 min on the treadmill and Octane. He averages 2.1 METs at 1.6 mph on the treadmill and 2.2 METs at level 3 on the Octane. He performs the warmup and cooldown standing. [name] has progressed for the track to the treadmill. He tolerates the treadmill well. [name] has also increased his level on the Octane. [name] is motivated to exercise and agreeable to increase his workloads. Will continue to monitor and progress as able. [name] has completed 20 exercise sessions. He exercises for 15 min on the treadmill and Octane. He averages 2.85 METs at 1.9 mph and 1.5% incline on the treadmill and 3.4 METs at level 5 on the Octane. He performs the warmup and cooldown standing. [name] has increased his speed and incline on the treadmil and level on the Octane. He tolerates these increases well. He remains motivated to exercise. Will continue to monitor and progress as able.     Expected Outcomes Through exercise at rehab and home, the patient will decrease shortness of breath with daily activities and feel confident in carrying out an exercise regimen at home. Through exercise at rehab and home, the patient will decrease shortness of breath with daily activities and feel confident in carrying out an exercise regimen at home. Through exercise at rehab and home, the patient will decrease shortness of breath with daily activities and feel confident in carrying out an exercise  regimen at home.        Discharge Exercise Prescription (Final Exercise Prescription Changes):  Exercise Prescription Changes - 02/09/24 1200       Response to Exercise   Blood Pressure (Admit) 123/63    Blood Pressure (Exercise) 195/80    Blood Pressure (Exit) 118/66    Heart Rate (Admit) 62 bpm    Heart Rate (Exercise) 103 bpm    Heart Rate (Exit) 70 bpm    Oxygen Saturation (Admit) 97 %    Oxygen Saturation (Exercise) 91 %    Oxygen Saturation (Exit) 97 %    Rating of Perceived Exertion (Exercise) 13    Perceived Dyspnea (Exercise) 2    Duration Continue with 30 min of aerobic exercise without signs/symptoms of physical distress.    Intensity THRR unchanged      Progression   Progression Continue to progress workloads to maintain intensity without signs/symptoms of physical distress.      Resistance Training   Training Prescription Yes    Weight black bands    Reps 10-15    Time 10 Minutes      Treadmill   MPH 1.9    Grade 1.5    Minutes 15    METs 2.5      Recumbant Elliptical   Level 5    Minutes 15    METs 2.5          Nutrition:  Target Goals: Understanding of nutrition guidelines, daily intake of sodium 1500mg , cholesterol 200mg , calories 30% from fat and 7% or less from saturated fats, daily to have 5 or more servings of fruits and vegetables.  Biometrics:  Pre Biometrics - 11/25/23 0921       Pre Biometrics   Grip Strength 30 kg           Nutrition Therapy Plan and Nutrition Goals:   Nutrition Assessments:  MEDIFICTS  Score Key: >=70 Need to make dietary changes  40-70 Heart Healthy Diet <= 40 Therapeutic Level Cholesterol Diet   Picture Your Plate Scores: <59 Unhealthy dietary pattern with much room for improvement. 41-50 Dietary pattern unlikely to meet recommendations for good health and room for improvement. 51-60 More healthful dietary pattern, with some room for improvement.  >60 Healthy dietary pattern, although there may  be some specific behaviors that could be improved.    Nutrition Goals Re-Evaluation:   Nutrition Goals Discharge (Final Nutrition Goals Re-Evaluation):   Psychosocial: Target Goals: Acknowledge presence or absence of significant depression and/or stress, maximize coping skills, provide positive support system. Participant is able to verbalize types and ability to use techniques and skills needed for reducing stress and depression.  Initial Review & Psychosocial Screening:  Initial Psych Review & Screening - 11/25/23 0931       Initial Review   Current issues with None Identified      Family Dynamics   Good Support System? Yes    Comments spouse and 2 children      Barriers   Psychosocial barriers to participate in program There are no identifiable barriers or psychosocial needs.      Screening Interventions   Interventions Encouraged to exercise          Quality of Life Scores:  Scores of 19 and below usually indicate a poorer quality of life in these areas.  A difference of  2-3 points is a clinically meaningful difference.  A difference of 2-3 points in the total score of the Quality of Life Index has been associated with significant improvement in overall quality of life, self-image, physical symptoms, and general health in studies assessing change in quality of life.  PHQ-9: Review Flowsheet  More data exists      01/21/2024 11/25/2023 11/19/2023 04/28/2023 12/01/2022  Depression screen PHQ 2/9  Decreased Interest 1 0 0 0 0  Down, Depressed, Hopeless 0 0 0 0 0  PHQ - 2 Score 1 0 0 0 0  Altered sleeping 0 0 0 - 0  Tired, decreased energy 1 0 0 - 0  Change in appetite 0 0 0 - 0  Feeling bad or failure about yourself  0 0 0 - 0  Trouble concentrating 0 0 0 - 0  Moving slowly or fidgety/restless 1 0 1 - 0  Suicidal thoughts 0 0 0 - 0  PHQ-9 Score 3 0  1  - 0   Difficult doing work/chores Not difficult at all Not difficult at all Not difficult at all - Not difficult at  all    Details       Data saved with a previous flowsheet row definition        Interpretation of Total Score  Total Score Depression Severity:  1-4 = Minimal depression, 5-9 = Mild depression, 10-14 = Moderate depression, 15-19 = Moderately severe depression, 20-27 = Severe depression   Psychosocial Evaluation and Intervention:  Psychosocial Evaluation - 11/25/23 0932       Psychosocial Evaluation & Interventions   Interventions Encouraged to exercise with the program and follow exercise prescription    Comments [name] denies any psychosocial barriers at this time.    Expected Outcomes For [name] to participate in PR free of psychosocial barriers.    Continue Psychosocial Services  No Follow up required          Psychosocial Re-Evaluation:  Psychosocial Re-Evaluation     Row Name 12/07/23 1430 01/04/24 0858 02/01/24  9049         Psychosocial Re-Evaluation   Current issues with None Identified None Identified None Identified     Comments [name] continues to deny any new psychosocial barriers or concerns at this time. [name] continues to deny any new psychosocial barriers or concerns at this time. Monthly psy/soc re-eval is as follows: [name] continues to deny any new psychosocial barriers or concerns at this time. He has had great attendance during the program so far and is a pleasure to work with. No needs at this time.     Expected Outcomes For [name] to participate in PR free of any psychosocial barriers or concerns For [name] to participate in PR free of any psychosocial barriers or concerns For [name] to participate in PR free of any psychosocial barriers or concerns     Interventions Encouraged to attend Pulmonary Rehabilitation for the exercise Encouraged to attend Pulmonary Rehabilitation for the exercise Encouraged to attend Pulmonary Rehabilitation for the exercise     Continue Psychosocial Services  No Follow up required No Follow up required No Follow up required         Psychosocial Discharge (Final Psychosocial Re-Evaluation):  Psychosocial Re-Evaluation - 02/01/24 0950       Psychosocial Re-Evaluation   Current issues with None Identified    Comments Monthly psy/soc re-eval is as follows: [name] continues to deny any new psychosocial barriers or concerns at this time. He has had great attendance during the program so far and is a pleasure to work with. No needs at this time.    Expected Outcomes For [name] to participate in PR free of any psychosocial barriers or concerns    Interventions Encouraged to attend Pulmonary Rehabilitation for the exercise    Continue Psychosocial Services  No Follow up required          Education: Education Goals: Education classes will be provided on a weekly basis, covering required topics. Participant will state understanding/return demonstration of topics presented.  Learning Barriers/Preferences:  Learning Barriers/Preferences - 11/25/23 0934       Learning Barriers/Preferences   Learning Barriers Sight    Learning Preferences None          Education Topics: Know Your Numbers Group instruction that is supported by a PowerPoint presentation. Instructor discusses importance of knowing and understanding resting, exercise, and post-exercise oxygen saturation, heart rate, and blood pressure. Oxygen saturation, heart rate, blood pressure, rating of perceived exertion, and dyspnea are reviewed along with a normal range for these values.  Flowsheet Row PULMONARY REHAB OTHER RESPIRATORY from 12/03/2023 in [facility] for Heart, Vascular, & Lung Health  Date 12/03/23  Educator EP  Instruction Review Code 1- Verbalizes Understanding    Exercise for the Pulmonary Patient Group instruction that is supported by a PowerPoint presentation. Instructor discusses benefits of exercise, core components of exercise, frequency, duration, and intensity of an exercise routine, importance of utilizing pulse  oximetry during exercise, safety while exercising, and options of places to exercise outside of rehab.    MET Level  Group instruction provided by PowerPoint, verbal discussion, and written material to support subject matter. Instructor reviews what METs are and how to increase METs.  Flowsheet Row PULMONARY REHAB OTHER RESPIRATORY from 01/21/2024 in [facility] for Heart, Vascular, & Lung Health  Date 01/21/24  Educator EP  Instruction Review Code 1- Verbalizes Understanding    Pulmonary Medications Verbally interactive group education provided by instructor with focus on inhaled medications  and proper administration.   Anatomy and Physiology of the Respiratory System Group instruction provided by PowerPoint, verbal discussion, and written material to support subject matter. Instructor reviews respiratory cycle and anatomical components of the respiratory system and their functions. Instructor also reviews differences in obstructive and restrictive respiratory diseases with examples of each.  Flowsheet Row PULMONARY REHAB OTHER RESPIRATORY from 02/04/2024 in [facility] for Heart, Vascular, & Lung Health  Date 02/04/24  Educator RT  Instruction Review Code 1- Verbalizes Understanding    Oxygen Safety Group instruction provided by PowerPoint, verbal discussion, and written material to support subject matter. There is an overview of "What is Oxygen" and "Why do we need it".  Instructor also reviews how to create a safe environment for oxygen use, the importance of using oxygen as prescribed, and the risks of noncompliance. There is a brief discussion on traveling with oxygen and resources the patient may utilize. Flowsheet Row PULMONARY REHAB OTHER RESPIRATORY from 12/17/2023 in [facility] for Heart, Vascular, & Lung Health  Date 12/17/23  Educator RT  Instruction Review Code 1- Verbalizes Understanding    Oxygen  Use Group instruction provided by PowerPoint, verbal discussion, and written material to discuss how supplemental oxygen is prescribed and different types of oxygen supply systems. Resources for more information are provided.    Breathing Techniques Group instruction that is supported by demonstration and informational handouts. Instructor discusses the benefits of pursed lip and diaphragmatic breathing and detailed demonstration on how to perform both.  Flowsheet Row PULMONARY REHAB OTHER RESPIRATORY from 12/24/2023 in [facility] for Heart, Vascular, & Lung Health  Date 12/24/23  Educator RN  Instruction Review Code 1- Verbalizes Understanding     Risk Factor Reduction Group instruction that is supported by a PowerPoint presentation. Instructor discusses the definition of a risk factor, different risk factors for pulmonary disease, and how the heart and lungs work together. Flowsheet Row PULMONARY REHAB OTHER RESPIRATORY from 01/14/2024 in [facility] for Heart, Vascular, & Lung Health  Date 01/14/24  Educator EP  Instruction Review Code 1- Verbalizes Understanding    Pulmonary Diseases Group instruction provided by PowerPoint, verbal discussion, and written material to support subject matter. Instructor gives an overview of the different type of pulmonary diseases. There is also a discussion on risk factors and symptoms as well as ways to manage the diseases. Flowsheet Row PULMONARY REHAB OTHER RESPIRATORY from 01/28/2024 in [facility] for Heart, Vascular, & Lung Health  Date 01/28/24  Educator RT  Instruction Review Code 1- Verbalizes Understanding    Stress and Energy Conservation Group instruction provided by PowerPoint, verbal discussion, and written material to support subject matter. Instructor gives an overview of stress and the impact it can have on the body. Instructor also reviews ways to reduce stress.  There is also a discussion on energy conservation and ways to conserve energy throughout the day. Flowsheet Row PULMONARY REHAB OTHER RESPIRATORY from 12/31/2023 in [facility] for Heart, Vascular, & Lung Health  Date 12/31/23  Educator RN  Instruction Review Code 1- Verbalizes Understanding    Warning Signs and Symptoms Group instruction provided by PowerPoint, verbal discussion, and written material to support subject matter. Instructor reviews warning signs and symptoms of stroke, heart attack, cold and flu. Instructor also reviews ways to prevent the spread of infection. Flowsheet Row PULMONARY REHAB OTHER RESPIRATORY from 01/07/2024 in [facility]  for Heart, Vascular, & Lung Health  Date 01/07/24  Educator RN  Instruction Review Code 1- Verbalizes Understanding    Other Education Group or individual verbal, written, or video instructions that support the educational goals of the pulmonary rehab program.    Knowledge Questionnaire Score:  Knowledge Questionnaire Score - 01/21/24 1215       Knowledge Questionnaire Score   Post Score 18/18          Core Components/Risk Factors/Patient Goals at Admission:  Personal Goals and Risk Factors at Admission - 11/25/23 0934       Core Components/Risk Factors/Patient Goals on Admission   Improve shortness of breath with ADL's Yes    Intervention Provide education, individualized exercise plan and daily activity instruction to help decrease symptoms of SOB with activities of daily living.    Expected Outcomes Short Term: Improve cardiorespiratory fitness to achieve a reduction of symptoms when performing ADLs;Long Term: Be able to perform more ADLs without symptoms or delay the onset of symptoms          Core Components/Risk Factors/Patient Goals Review:   Goals and Risk Factor Review     Row Name 12/07/23 1431 01/04/24 0858 02/01/24 0951         Core Components/Risk  Factors/Patient Goals Review   Personal Goals Review Improve shortness of breath with ADL's;Develop more efficient breathing techniques such as purse lipped breathing and diaphragmatic breathing and practicing self-pacing with activity. Improve shortness of breath with ADL's;Develop more efficient breathing techniques such as purse lipped breathing and diaphragmatic breathing and practicing self-pacing with activity. Improve shortness of breath with ADL's;Develop more efficient breathing techniques such as purse lipped breathing and diaphragmatic breathing and practicing self-pacing with activity.     Review Monthly review of patient's Core Components/Risk Factors/Patient Goals are as follows: Goal progressing for improving shortness of breath. Goal progressing for developing more efficient breathing techniques such as purse lipped breathing and diaphragmatic breathing; and practicing self-pacing with activity. [name] is currently exercising on RA to keep sats >88%. We will continue to monitor his progress throughout the program. Monthly review of patient's Core Components/Risk Factors/Patient Goals are as follows: Goal progressing for improving shortness of breath with ADL's. Goal progressing for developing more efficient breathing techniques such as purse lipped breathing and diaphragmatic breathing; and practicing self-pacing with activity. [name] has attended the breathing technique class and is using purse lip breathing when he gets SOB. [name] is currently exercising on RA to keep sats >88%. We will continue to monitor his progress throughout the program. Monthly review of patient's Core Components/Risk Factors/Patient Goals are as follows: Goal progressing for improving shortness of breath with ADL's. [name] is currently exercising on RA to keep sats >88%. Goal met for developing more efficient breathing techniques such as purse lipped breathing and diaphragmatic breathing; and practicing self-pacing with activity.  [name] has attended the breathing technique class and is using purse lip breathing when he gets SOB. He also knows how to self pace based on his RPE/dyspnea scores. We will continue to monitor his progress throughout the program.     Expected Outcomes To improve shortness of breath with ADL's and develop more efficient breathing techniques such as purse lipped breathing and diaphragmatic breathing; and practicing self-pacing with activity To improve shortness of breath with ADL's and develop more efficient breathing techniques such as purse lipped breathing and diaphragmatic breathing; and practicing self-pacing with activity To improve shortness of breath with ADL's  Core Components/Risk Factors/Patient Goals at Discharge (Final Review):   Goals and Risk Factor Review - 02/01/24 0951       Core Components/Risk Factors/Patient Goals Review   Personal Goals Review Improve shortness of breath with ADL's;Develop more efficient breathing techniques such as purse lipped breathing and diaphragmatic breathing and practicing self-pacing with activity.    Review Monthly review of patient's Core Components/Risk Factors/Patient Goals are as follows: Goal progressing for improving shortness of breath with ADL's. [name] is currently exercising on RA to keep sats >88%. Goal met for developing more efficient breathing techniques such as purse lipped breathing and diaphragmatic breathing; and practicing self-pacing with activity. [name] has attended the breathing technique class and is using purse lip breathing when he gets SOB. He also knows how to self pace based on his RPE/dyspnea scores. We will continue to monitor his progress throughout the program.    Expected Outcomes To improve shortness of breath with ADL's          ITP Comments: Pt is making expected progress toward Pulmonary Rehab goals after completing 22 session(s). Recommend continued exercise, life style modification, education, and utilization of  breathing techniques to increase stamina and strength, while also decreasing shortness of breath with exertion.  Dr. Slater Staff is Medical Director for Pulmonary Rehab at [facility].

## 2024-02-15 ENCOUNTER — Other Ambulatory Visit: Payer: Self-pay | Admitting: Physician Assistant

## 2024-02-15 DIAGNOSIS — I4891 Unspecified atrial fibrillation: Secondary | ICD-10-CM

## 2024-02-16 ENCOUNTER — Encounter (HOSPITAL_COMMUNITY)
Admission: RE | Admit: 2024-02-16 | Discharge: 2024-02-16 | Disposition: A | Source: Ambulatory Visit | Attending: Pulmonary Disease

## 2024-02-16 DIAGNOSIS — J432 Centrilobular emphysema: Secondary | ICD-10-CM | POA: Diagnosis present

## 2024-02-16 MED ORDER — RIVAROXABAN 20 MG PO TABS: 20.0000 mg | ORAL_TABLET | Freq: Every day | ORAL | 1 refills | Status: AC

## 2024-02-16 NOTE — Progress Notes (Signed)
 Daily Session Note  Patient Details  Name: [name] MRN: [id number] Date of Birth: [birth year] Referring Provider:   Conrad Ports Pulmonary Rehab Walk Test from 11/25/2023 in [facility] for Heart, Vascular, & Lung Health  Referring Provider [place]aker    Encounter Date: 02/16/2024  Check In:  Session Check In - 02/16/24 1028       Check-In   Supervising physician immediately available to respond to emergencies [facility] MD immediately available    Physician(s) Damien Braver, NP    Location [facility]    Staff Present Augustin Sharps, Neita Moats, MS, ACSM-CEP, Exercise Physiologist;Filmore Molyneux Harvy, RN, BSN;Randi Reeve BS, ACSM-CEP, Exercise Physiologist    Virtual Visit No    Medication changes reported     No    Fall or balance concerns reported    No    Tobacco Cessation No Change    Warm-up and Cool-down Performed as group-led instruction    Resistance Training Performed Yes    VAD Patient? No    PAD/SET Patient? No      Pain Assessment   Currently in Pain? No/denies    Multiple Pain Sites No          Capillary Blood Glucose: No results found for this or any previous visit (from the past 24 hours).    Social History   Tobacco Use  Smoking Status Former   Current packs/day: 0.00   Average packs/day: 0.5 packs/day for 54.0 years (27.0 ttl pk-yrs)   Types: Cigarettes   Start date: 5   Quit date: 03/17/2022   Years since quitting: 1.9  Smokeless Tobacco Never  Tobacco Comments   11/30/19 .5-1pack per day    Goals Met:  Independence with exercise equipment Exercise tolerated well Queuing for purse lip breathing No report of concerns or symptoms today Strength training completed today  Goals Unmet:  Not Applicable  Comments: Service time is from 1006 to 1141    Dr. Slater Staff is Medical Director for Pulmonary Rehab at [facility].

## 2024-02-16 NOTE — Telephone Encounter (Signed)
 Xarelto  20mg  refill request received. Pt is 73 years old, weight-76.9kg, Crea-1.19 on 12/08/23, last seen by Orren Fabry on 05/19/23, Diagnosis-Afib, CrCl-61.03 mL/min; Dose is appropriate based on dosing criteria. Will send in refill to requested pharmacy.

## 2024-02-18 ENCOUNTER — Encounter (HOSPITAL_COMMUNITY)
Admission: RE | Admit: 2024-02-18 | Discharge: 2024-02-18 | Disposition: A | Source: Ambulatory Visit | Attending: Pulmonary Disease

## 2024-02-18 DIAGNOSIS — J432 Centrilobular emphysema: Secondary | ICD-10-CM | POA: Diagnosis not present

## 2024-02-18 NOTE — Progress Notes (Signed)
 Daily Session Note  Patient Details  Name: [name] MRN: [id number] Date of Birth: 10-01-1950 Referring Provider:   Conrad Ports Pulmonary Rehab Walk Test from 11/25/2023 in [facility] for Heart, Vascular, & Lung Health  Referring Provider Assaker    Encounter Date: 02/18/2024  Check In:  Session Check In - 02/18/24 1113       Check-In   Supervising physician immediately available to respond to emergencies [facility] MD immediately available    Physician(s) Rosaline Skains, NP    Location [facility]    Staff Present Augustin Sharps, Neita Moats, MS, ACSM-CEP, Exercise Physiologist;Mary Harvy, RN, BSN;Carlette Carlton, RN, BSN    Virtual Visit No    Medication changes reported     No    Fall or balance concerns reported    No    Tobacco Cessation No Change    Warm-up and Cool-down Performed as group-led Writer Performed Yes    VAD Patient? No    PAD/SET Patient? No      Pain Assessment   Currently in Pain? No/denies    Multiple Pain Sites No          Capillary Blood Glucose: No results found for this or any previous visit (from the past 24 hours).    Social History   Tobacco Use  Smoking Status Former   Current packs/day: 0.00   Average packs/day: 0.5 packs/day for 54.0 years (27.0 ttl pk-yrs)   Types: Cigarettes   Start date: 74   Quit date: 03/17/2022   Years since quitting: 1.9  Smokeless Tobacco Never  Tobacco Comments   11/30/19 .5-1pack per day    Goals Met:  Proper associated with RPD/PD & O2 Sat Independence with exercise equipment Exercise tolerated well No report of concerns or symptoms today Strength training completed today  Goals Unmet:  Not Applicable  Comments: Service time is from 1000 to 1144.    Dr. Slater Staff is Medical Director for Pulmonary Rehab at [facility].

## 2024-02-19 NOTE — Progress Notes (Signed)
 Discharge Progress Report  Patient Details  Name: [name] MRN: [id number] Date of Birth: 1950-08-12 Referring Provider:   [facility] Walk Test from 11/25/2023 in [facility]  Referring Provider Assaker     Number of Visits: 23  Reason for Discharge:  Patient has met program and personal goals.  Smoking History:  Social History   Tobacco Use  Smoking Status Former   Current packs/day: 0.00   Average packs/day: 0.5 packs/day for 54.0 years (27.0 ttl pk-yrs)   Types: Cigarettes   Start date: 32   Quit date: 03/17/2022   Years since quitting: 1.9  Smokeless Tobacco Never  Tobacco Comments   11/30/19 .5-1pack per day    Diagnosis:  Centrilobular emphysema (HCC)  ADL [facility]:  Pulmonary Assessment Scores     Row Name 11/25/23 0935 01/21/24 1216 02/18/24 1608     ADL [facility]   ADL Phase Entry Exit --   SOB Score total 62 39 --     CAT Score   CAT Score 22 17 --     mMRC Score   mMRC Score 3 -- 2      Initial Exercise Prescription:  Initial Exercise Prescription - 11/25/23 1000       Date of Initial Exercise RX and Referring Provider   Date 11/25/23    Referring Provider Assaker    Expected Discharge Date 02/18/24      Recumbant Elliptical   Level 1    RPM 35    Watts 40    Minutes 15    METs 1.4      Track   Minutes 15    METs 2      Prescription Details   Frequency (times per week) 2    Duration Progress to 30 minutes of continuous aerobic without signs/symptoms of physical distress      Intensity   THRR 40-80% of Max Heartrate 59-118    Ratings of Perceived Exertion 11-13    Perceived Dyspnea 0-4      Progression   Progression Continue to progress workloads to maintain intensity without signs/symptoms of physical distress.      Resistance Training   Training Prescription Yes    Weight red bands    Reps 10-15          Discharge Exercise Prescription (Final  Exercise Prescription Changes):  Exercise Prescription Changes - 02/09/24 1200       Response to Exercise   Blood Pressure (Admit) 123/63    Blood Pressure (Exercise) 195/80    Blood Pressure (Exit) 118/66    Heart Rate (Admit) 62 bpm    Heart Rate (Exercise) 103 bpm    Heart Rate (Exit) 70 bpm    Oxygen Saturation (Admit) 97 %    Oxygen Saturation (Exercise) 91 %    Oxygen Saturation (Exit) 97 %    Rating of Perceived Exertion (Exercise) 13    Perceived Dyspnea (Exercise) 2    Duration Continue with 30 min of aerobic exercise without signs/symptoms of physical distress.    Intensity THRR unchanged      Progression   Progression Continue to progress workloads to maintain intensity without signs/symptoms of physical distress.      Resistance Training   Training Prescription Yes    Weight black bands    Reps 10-15    Time 10 Minutes      Treadmill   MPH 1.9    Grade  1.5    Minutes 15    METs 2.5      Recumbant Elliptical   Level 5    Minutes 15    METs 2.5          Functional Capacity:  6 Minute Walk     Row Name 11/25/23 1036 02/18/24 1532       6 Minute Walk   Phase Initial Discharge    Distance 1023 feet 1262 feet    Distance % Change -- 23.36 %    Distance Feet Change -- 239 ft    Walk Time 6 minutes 6 minutes    # of Rest Breaks 0 0    MPH 1.94 2.39    METS 2.65 3.27    RPE 13 10    Perceived Dyspnea  1 1    VO2 Peak 9.27 11.45    Symptoms No No    Resting HR 61 bpm 74 bpm    Resting BP 126/72 112/60    Resting Oxygen Saturation  97 % 99 %    Exercise Oxygen Saturation  during 6 min walk 93 % 91 %    Max Ex. HR 75 bpm 95 bpm    Max Ex. BP 144/70 142/60    2 Minute Post BP 132/70 134/64      Interval HR   1 Minute HR 65 85    2 Minute HR 70 89    3 Minute HR 73 91    4 Minute HR 73 93    5 Minute HR 75 95    6 Minute HR 75 95    2 Minute Post HR 61 90    Interval Heart Rate? Yes Yes      Interval Oxygen   Interval Oxygen? Yes Yes     Baseline Oxygen Saturation % 97 % 99 %    1 Minute Oxygen Saturation % 96 % 96 %    1 Minute Liters of Oxygen 0 L 0 L    2 Minute Oxygen Saturation % 94 % 93 %    2 Minute Liters of Oxygen 0 L 0 L    3 Minute Oxygen Saturation % 93 % 92 %    3 Minute Liters of Oxygen 0 L 0 L    4 Minute Oxygen Saturation % 94 % 91 %    4 Minute Liters of Oxygen 0 L 0 L    5 Minute Oxygen Saturation % 94 % 92 %    5 Minute Liters of Oxygen 0 L 0 L    6 Minute Oxygen Saturation % 93 % 91 %    6 Minute Liters of Oxygen 0 L 0 L    2 Minute Post Oxygen Saturation % 98 % 97 %    2 Minute Post Liters of Oxygen 0 L 0 L       Psychological, QOL, Others - Outcomes: PHQ 2/9:    01/21/2024   12:14 PM 11/25/2023   11:07 AM 11/19/2023    8:18 AM 04/28/2023    1:37 PM 12/01/2022    9:28 AM  Depression screen PHQ 2/9  Decreased Interest 1 0 0 0 0  Down, Depressed, Hopeless 0 0 0 0 0  PHQ - 2 Score 1 0 0 0 0  Altered sleeping 0 0 0  0  Tired, decreased energy 1 0 0  0  Change in appetite 0 0 0  0  Feeling bad or failure about yourself  0  0 0  0  Trouble concentrating 0 0 0  0  Moving slowly or fidgety/restless 1 0 1  0  Suicidal thoughts 0 0 0  0  PHQ-9 Score 3 0  1   0   Difficult doing work/chores Not difficult at all Not difficult at all Not difficult at all  Not difficult at all     Data saved with a previous flowsheet row definition    Quality of Life:   Personal Goals: Goals established at orientation with interventions provided to work toward goal.  Personal Goals and Risk Factors at Admission - 11/25/23 0934       Core Components/Risk Factors/Patient Goals on Admission   Improve shortness of breath with ADL's Yes    Intervention Provide education, individualized exercise plan and daily activity instruction to help decrease symptoms of SOB with activities of daily living.    Expected Outcomes Short Term: Improve cardiorespiratory fitness to achieve a reduction of symptoms when performing  ADLs;Long Term: Be able to perform more ADLs without symptoms or delay the onset of symptoms           Personal Goals Discharge:  Goals and Risk Factor Review     Row Name 12/07/23 1431 01/04/24 0858 02/01/24 0951 02/19/24 0921       Core Components/Risk Factors/Patient Goals Review   Personal Goals Review Improve shortness of breath with ADL's;Develop more efficient breathing techniques such as purse lipped breathing and diaphragmatic breathing and practicing self-pacing with activity. Improve shortness of breath with ADL's;Develop more efficient breathing techniques such as purse lipped breathing and diaphragmatic breathing and practicing self-pacing with activity. Improve shortness of breath with ADL's;Develop more efficient breathing techniques such as purse lipped breathing and diaphragmatic breathing and practicing self-pacing with activity. Improve shortness of breath with ADL's    Review Monthly review of patient's Core Components/Risk Factors/Patient Goals are as follows: Goal progressing for improving shortness of breath. Goal progressing for developing more efficient breathing techniques such as purse lipped breathing and diaphragmatic breathing; and practicing self-pacing with activity. [name] is currently exercising on RA to keep sats >88%. We will continue to monitor his progress throughout the program. Monthly review of patient's Core Components/Risk Factors/Patient Goals are as follows: Goal progressing for improving shortness of breath with ADL's. Goal progressing for developing more efficient breathing techniques such as purse lipped breathing and diaphragmatic breathing; and practicing self-pacing with activity. [name] has attended the breathing technique class and is using purse lip breathing when he gets SOB. [name] is currently exercising on RA to keep sats >88%. We will continue to monitor his progress throughout the program. Monthly review of patient's Core Components/Risk  Factors/Patient Goals are as follows: Goal progressing for improving shortness of breath with ADL's. [name] is currently exercising on RA to keep sats >88%. Goal met for developing more efficient breathing techniques such as purse lipped breathing and diaphragmatic breathing; and practicing self-pacing with activity. [name] has attended the breathing technique class and is using purse lip breathing when he gets SOB. He also knows how to self pace based on his RPE/dyspnea scores. We will continue to monitor his progress throughout the program. [name] graduated from the PR program on 02/18/24. He met his goal for improving his shortness of breath with ADL's. He was able to increase his workloads and MET's during the program. His shortness of breath score decreased from 62 to 39. [name] did well during the program and we wish him the best.    Expected  Outcomes To improve shortness of breath with ADL's and develop more efficient breathing techniques such as purse lipped breathing and diaphragmatic breathing; and practicing self-pacing with activity To improve shortness of breath with ADL's and develop more efficient breathing techniques such as purse lipped breathing and diaphragmatic breathing; and practicing self-pacing with activity To improve shortness of breath with ADL's To continue to exercise and modify his nutrition and lifestyle post graduation       Exercise Goals and Review:  Exercise Goals     Row Name 11/25/23 0927             Exercise Goals   Increase Physical Activity Yes       Intervention Provide advice, education, support and counseling about physical activity/exercise needs.;Develop an individualized exercise prescription for aerobic and resistive training based on initial evaluation findings, risk stratification, comorbidities and participant's personal goals.       Expected Outcomes Short Term: Attend rehab on a regular basis to increase amount of physical activity.;Long Term: Add in home  exercise to make exercise part of routine and to increase amount of physical activity.;Long Term: Exercising regularly at least 3-5 days a week.       Increase Strength and Stamina Yes       Intervention Provide advice, education, support and counseling about physical activity/exercise needs.;Develop an individualized exercise prescription for aerobic and resistive training based on initial evaluation findings, risk stratification, comorbidities and participant's personal goals.       Expected Outcomes Short Term: Increase workloads from initial exercise prescription for resistance, speed, and METs.;Short Term: Perform resistance training exercises routinely during rehab and add in resistance training at home;Long Term: Improve cardiorespiratory fitness, muscular endurance and strength as measured by increased METs and functional capacity ( )       Able to understand and use rate of perceived exertion (RPE) scale Yes       Intervention Provide education and explanation on how to use RPE scale       Expected Outcomes Short Term: Able to use RPE daily in rehab to express subjective intensity level;Long Term:  Able to use RPE to guide intensity level when exercising independently       Able to understand and use Dyspnea scale Yes       Intervention Provide education and explanation on how to use Dyspnea scale       Expected Outcomes Short Term: Able to use Dyspnea scale daily in rehab to express subjective sense of shortness of breath during exertion;Long Term: Able to use Dyspnea scale to guide intensity level when exercising independently       Knowledge and understanding of Target Heart Rate Range (THRR) Yes       Intervention Provide education and explanation of THRR including how the numbers were predicted and where they are located for reference       Expected Outcomes Short Term: Able to state/look up THRR;Long Term: Able to use THRR to govern intensity when exercising independently;Short Term:  Able to use daily as guideline for intensity in rehab       Understanding of Exercise Prescription Yes       Intervention Provide education, explanation, and written materials on patient's individual exercise prescription       Expected Outcomes Short Term: Able to explain program exercise prescription;Long Term: Able to explain home exercise prescription to exercise independently          Exercise Goals Re-Evaluation:  Exercise Goals Re-Evaluation  Row Name 12/04/23 1048 01/01/24 0930 02/05/24 1047 02/18/24 1535       Exercise Goal Re-Evaluation   Exercise Goals Review Increase Physical Activity;Able to understand and use Dyspnea scale;Understanding of Exercise Prescription;Increase Strength and Stamina;Knowledge and understanding of Target Heart Rate Range (THRR);Able to understand and use rate of perceived exertion (RPE) scale Increase Physical Activity;Able to understand and use Dyspnea scale;Understanding of Exercise Prescription;Increase Strength and Stamina;Knowledge and understanding of Target Heart Rate Range (THRR);Able to understand and use rate of perceived exertion (RPE) scale Increase Physical Activity;Able to understand and use Dyspnea scale;Understanding of Exercise Prescription;Increase Strength and Stamina;Knowledge and understanding of Target Heart Rate Range (THRR);Able to understand and use rate of perceived exertion (RPE) scale Increase Physical Activity;Able to understand and use Dyspnea scale;Understanding of Exercise Prescription;Increase Strength and Stamina;Knowledge and understanding of Target Heart Rate Range (THRR);Able to understand and use rate of perceived exertion (RPE) scale    Comments [name] has completed 2 exercise sessions. He exercises for 15 min on the track and Octane. He averages 2.75 METs on the track and 2.6 METs at level 1 on the Octane. He performs the warmup and cooldown standing without limitations. It is too soon to notate and disernable progressions.  Will continue to monitor and progress as able. [name] has completed 10 exercise sessions. He exercises for 15 min on the treadmill and Octane. He averages 2.1 METs at 1.6 mph on the treadmill and 2.2 METs at level 3 on the Octane. He performs the warmup and cooldown standing. [name] has progressed for the track to the treadmill. He tolerates the treadmill well. [name] has also increased his level on the Octane. [name] is motivated to exercise and agreeable to increase his workloads. Will continue to monitor and progress as able. [name] has completed 20 exercise sessions. He exercises for 15 min on the treadmill and Octane. He averages 2.85 METs at 1.9 mph and 1.5% incline on the treadmill and 3.4 METs at level 5 on the Octane. He performs the warmup and cooldown standing. [name] has increased his speed and incline on the treadmil and level on the Octane. He tolerates these increases well. He remains motivated to exercise. Will continue to monitor and progress as able. [name] has completed 23 exercise sessions. His peak METs were 2.5 on the treadmill and 3.5 on the Octane. He plans to continue exercise at a gym.    Expected Outcomes Through exercise at rehab and home, the patient will decrease shortness of breath with daily activities and feel confident in carrying out an exercise regimen at home. Through exercise at rehab and home, the patient will decrease shortness of breath with daily activities and feel confident in carrying out an exercise regimen at home. Through exercise at rehab and home, the patient will decrease shortness of breath with daily activities and feel confident in carrying out an exercise regimen at home. Through exercise at rehab and home, the patient will decrease shortness of breath with daily activities and feel confident in carrying out an exercise regimen at home.       Nutrition & Weight - Outcomes:  Pre Biometrics - 11/25/23 0921       Pre Biometrics   Grip Strength 30 kg          Post  Biometrics - 02/18/24 1537        Post  Biometrics   Grip Strength 34 kg          Nutrition:   Nutrition Discharge:   Education  Questionnaire Score:  Knowledge Questionnaire Score - 01/21/24 1215       Knowledge Questionnaire Score   Post Score 18/18          Goals reviewed with patient; copy given to patient.

## 2024-02-22 ENCOUNTER — Ambulatory Visit: Admitting: Pulmonary Disease

## 2024-02-22 ENCOUNTER — Encounter: Payer: Self-pay | Admitting: Pulmonary Disease

## 2024-02-22 VITALS — BP 110/68 | HR 65 | Temp 98.5°F | Ht 72.75 in | Wt 171.2 lb

## 2024-02-22 DIAGNOSIS — R918 Other nonspecific abnormal finding of lung field: Secondary | ICD-10-CM | POA: Diagnosis not present

## 2024-02-22 DIAGNOSIS — J4489 Other specified chronic obstructive pulmonary disease: Secondary | ICD-10-CM

## 2024-02-22 DIAGNOSIS — Z8546 Personal history of malignant neoplasm of prostate: Secondary | ICD-10-CM | POA: Diagnosis not present

## 2024-02-22 DIAGNOSIS — J439 Emphysema, unspecified: Secondary | ICD-10-CM

## 2024-02-22 DIAGNOSIS — Z87891 Personal history of nicotine dependence: Secondary | ICD-10-CM

## 2024-02-22 DIAGNOSIS — Z923 Personal history of irradiation: Secondary | ICD-10-CM | POA: Diagnosis not present

## 2024-02-22 NOTE — Progress Notes (Signed)
 Synopsis: Referred in by Rollene Almarie LABOR, *   Subjective:   PATIENT ID: [name] GENDER: male DOB: 04-23-1950, MRN: [id number]  Chief Complaint  Patient presents with   Emphysema    DOE. Wheezing. Cough with clear/white sputum. Trelegy- daily helps with his breathing. Albuterol - 1-2 times daily.     HPI [name] is a pleasant 73 year old male patient with a past medical history of prostate cancer diagnosed in January 2025 status post radiation, left upper lobe nodule status post robotic assisted navigational bronchoscopy which was negative for malignancy, COPD with emphysema presenting today to the pulmonary clinic for follow-up visit.  He was recently diagnosed with prostate cancer and completed radiation last month.  Current of the workup he was found to have a left upper.  This was biopsied on 05/29 which was negative.  Given concern for possibly false negative we decided to repeat a CT chest in 3 months.  Repeat CT chest on 08/29 shows significantly decreased left upper lobe nodule which was reassuring.  It also showed a new right upper lobe nodule that appears to be infectious.  He also has a history of COPD and used to see Dr. Alaine.  He is currently on Trelegy Ellipta  101 puff daily and albuterol  basis.  He did not have full PFTs but had spirometry in 2018 that showed an FEV1 of 45% of predicted with an FEV1/FVC ratio of 54%.  Family history -lung cancer in his biological mother was a smoker.  Social history previous smoker quit in 2017.  Smoked 2 packs/day for 42 years.   OV 02/22/2024 -[name] is here to follow-up regarding his COPD stage III group B.  He is doing overall well.  Has completed his pulmonary rehab on Thursday and has noted a difference with that.  He is compliant with Trelegy and rarely uses his rescue inhaler.  We discussed continuing on that regimen and continue staying active.  I will follow-up with him in 6 months and if he is still having  difficulty breathing or dyspnea on exertion we can escalate to Ensifentrine .  Regarding his lung nodule his last CAT scan and August 2025 showed a nearly resolved left upper lobe irregular nodule.  He is to go back on yearly lung cancer screening.  ROS All systems were reviewed and are negative exceot for the above.  Objective:   Vitals:   02/22/24 0928  BP: 110/68  Pulse: 65  Temp: 98.5 F (36.9 C)  SpO2: 96%  Weight: 171 lb 3.2 oz (77.7 kg)  Height: 6' 0.75 (1.848 m)   96% on RA BMI Readings from Last 3 Encounters:  02/22/24 22.74 kg/m  02/09/24 22.52 kg/m  01/26/24 22.81 kg/m   Wt Readings from Last 3 Encounters:  02/22/24 171 lb 3.2 oz (77.7 kg)  02/09/24 169 lb 8.5 oz (76.9 kg)  01/26/24 171 lb 11.8 oz (77.9 kg)    Physical Exam GEN: NAD, Healthy Appearing HEENT: Supple Neck, Reactive Pupils, EOMI  CVS: Normal S1, Normal S2, RRR, No murmurs or ES appreciated  Lungs: Poor air movement otherwise no wheezing or crackles appreciated.  Abdomen: Soft, non tender, non distended, + BS  Extremities: Warm and well perfused, No edema   Labs and imaging were reviewed.   Ancillary Information   CBC    Component Value Date/Time   WBC 6.4 12/08/2023 0936   RBC 4.88 12/08/2023 0936   HGB 15.6 12/08/2023 0936   HGB 15.9 11/19/2022 0905   HCT  46.9 12/08/2023 0936   HCT 46.2 11/19/2022 0905   PLT 262.0 12/08/2023 0936   PLT 276 11/19/2022 0905   MCV 96.2 12/08/2023 0936   MCV 94 11/19/2022 0905   MCH 32.6 08/13/2023 0816   MCHC 33.2 12/08/2023 0936   RDW 13.7 12/08/2023 0936   RDW 12.6 11/19/2022 0905   LYMPHSABS 2.6 03/04/2016 2336   MONOABS 0.6 03/04/2016 2336   EOSABS 0.2 03/04/2016 2336   BASOSABS 0.1 03/04/2016 2336    Imaging reviewed.     No data to display           Assessment & Plan:  [name] is a pleasant 73 year old male patient with a past medical history of prostate cancer diagnosed in January 2025 status post radiation, left upper lobe  nodule status post robotic assisted navigational bronchoscopy which was negative for malignancy, COPD with emphysema presenting today to the pulmonary clinic for follow-up visit.  # COPD stage III based based on spirometry in 2018 with emphysema and chronic bronchitis.   []  PFTs  []  C/w fluticasone-vilanterol-umecledinium [Trelegy Ellipta ] 100 1puff daily.  []  C/w Xopenex  as needed.  []  Amb referral to pulmonary rehab. [Completed 02/2024]  #LUL pulmonary nodule  #RUL new Nodule (Most likely infectious) S/p Nav Bronch 07/2023, negative for maligancy. Almost completely resolved on most recent CT chest 10/2023. Likely a foci of mucous plugging.   []  Can resume yearly LDCT screening [Next 10/2024]  #Prostate cancer s.p radiation.   RTC 6 months  I personally spent a total of 30 minutes in the care of the patient today including preparing to see the patient, getting/reviewing separately obtained history, performing a medically appropriate exam/evaluation, counseling and educating, documenting clinical information in the EHR, independently interpreting results, and communicating results.   Darrin Barn, MD [facility] 02/22/2024 9:48 AM

## 2024-02-24 ENCOUNTER — Encounter: Payer: Self-pay | Admitting: Pharmacist

## 2024-02-24 NOTE — Progress Notes (Signed)
 Pharmacy Quality Measure Review  This patient is appearing on a report for being at risk of failing the adherence measure for cholesterol (statin) medications this calendar year.   Medication: Atorvastatin  Last fill date: 01/25/24 for 90 day supply  Insurance report was not up to date. No action needed at this time.   Darrelyn Drum, PharmD, BCPS, CPP Clinical Pharmacist Practitioner [facility] [phone]

## 2024-02-29 ENCOUNTER — Encounter: Payer: Self-pay | Admitting: *Deleted

## 2024-04-18 ENCOUNTER — Telehealth: Payer: Self-pay | Admitting: *Deleted

## 2024-04-18 ENCOUNTER — Encounter: Payer: Self-pay | Admitting: Physician Assistant

## 2024-04-18 ENCOUNTER — Ambulatory Visit: Admitting: Physician Assistant

## 2024-04-18 VITALS — BP 102/58 | HR 73 | Ht 74.0 in | Wt 172.0 lb

## 2024-04-18 DIAGNOSIS — Z79899 Other long term (current) drug therapy: Secondary | ICD-10-CM | POA: Diagnosis not present

## 2024-04-18 DIAGNOSIS — I480 Paroxysmal atrial fibrillation: Secondary | ICD-10-CM | POA: Diagnosis not present

## 2024-04-18 DIAGNOSIS — I6523 Occlusion and stenosis of bilateral carotid arteries: Secondary | ICD-10-CM

## 2024-04-18 DIAGNOSIS — I2510 Atherosclerotic heart disease of native coronary artery without angina pectoris: Secondary | ICD-10-CM

## 2024-04-18 DIAGNOSIS — I5022 Chronic systolic (congestive) heart failure: Secondary | ICD-10-CM | POA: Diagnosis not present

## 2024-04-18 DIAGNOSIS — I4891 Unspecified atrial fibrillation: Secondary | ICD-10-CM | POA: Diagnosis not present

## 2024-04-18 DIAGNOSIS — R0609 Other forms of dyspnea: Secondary | ICD-10-CM | POA: Diagnosis not present

## 2024-04-21 ENCOUNTER — Ambulatory Visit (HOSPITAL_COMMUNITY)
Admission: RE | Admit: 2024-04-21 | Discharge: 2024-04-21 | Attending: Physician Assistant | Admitting: Physician Assistant

## 2024-04-21 DIAGNOSIS — R0609 Other forms of dyspnea: Secondary | ICD-10-CM

## 2024-04-21 DIAGNOSIS — I6523 Occlusion and stenosis of bilateral carotid arteries: Secondary | ICD-10-CM

## 2024-04-22 ENCOUNTER — Encounter: Payer: Self-pay | Admitting: Urology

## 2024-04-22 ENCOUNTER — Encounter: Payer: Self-pay | Admitting: *Deleted

## 2024-04-22 NOTE — Progress Notes (Signed)
 SCP completed and mailed to pt.

## 2024-04-25 ENCOUNTER — Inpatient Hospital Stay: Admitting: *Deleted

## 2024-04-26 ENCOUNTER — Ambulatory Visit (HOSPITAL_BASED_OUTPATIENT_CLINIC_OR_DEPARTMENT_OTHER)

## 2024-04-28 ENCOUNTER — Encounter: Admitting: Orthopaedic Surgery

## 2024-05-25 ENCOUNTER — Ambulatory Visit (HOSPITAL_COMMUNITY)

## 2024-06-30 ENCOUNTER — Ambulatory Visit: Admitting: Physician Assistant

## 2024-11-23 ENCOUNTER — Ambulatory Visit
